# Patient Record
Sex: Male | Born: 1950
Health system: Southern US, Community
[De-identification: ages and names within clinical notes are randomized; demographics above are authoritative.]

## PROBLEM LIST (undated history)

## (undated) DIAGNOSIS — D649 Anemia, unspecified: Secondary | ICD-10-CM

## (undated) DIAGNOSIS — I10 Essential (primary) hypertension: Secondary | ICD-10-CM

---

## 2020-01-21 ENCOUNTER — Other Ambulatory Visit: Payer: Self-pay

## 2020-01-21 ENCOUNTER — Inpatient Hospital Stay (HOSPITAL_COMMUNITY)
Admission: EM | Admit: 2020-01-21 | Discharge: 2020-02-03 | DRG: 374 | Disposition: A | Discharge status: Home Health | Payer: Self-pay | Attending: Student | Admitting: Student

## 2020-01-21 ENCOUNTER — Encounter (HOSPITAL_COMMUNITY): Payer: Self-pay | Admitting: Emergency Medicine

## 2020-01-21 ENCOUNTER — Emergency Department (HOSPITAL_COMMUNITY): Payer: Self-pay

## 2020-01-21 DIAGNOSIS — R634 Abnormal weight loss: Secondary | ICD-10-CM | POA: Diagnosis present

## 2020-01-21 DIAGNOSIS — Z515 Encounter for palliative care: Secondary | ICD-10-CM

## 2020-01-21 DIAGNOSIS — R509 Fever, unspecified: Secondary | ICD-10-CM

## 2020-01-21 DIAGNOSIS — D630 Anemia in neoplastic disease: Secondary | ICD-10-CM | POA: Diagnosis present

## 2020-01-21 DIAGNOSIS — Z66 Do not resuscitate: Secondary | ICD-10-CM | POA: Diagnosis present

## 2020-01-21 DIAGNOSIS — Z20822 Contact with and (suspected) exposure to covid-19: Secondary | ICD-10-CM | POA: Diagnosis present

## 2020-01-21 DIAGNOSIS — E871 Hypo-osmolality and hyponatremia: Secondary | ICD-10-CM | POA: Diagnosis not present

## 2020-01-21 DIAGNOSIS — Z79899 Other long term (current) drug therapy: Secondary | ICD-10-CM

## 2020-01-21 DIAGNOSIS — E43 Unspecified severe protein-calorie malnutrition: Secondary | ICD-10-CM | POA: Diagnosis present

## 2020-01-21 DIAGNOSIS — K750 Abscess of liver: Secondary | ICD-10-CM | POA: Diagnosis present

## 2020-01-21 DIAGNOSIS — Z6820 Body mass index (BMI) 20.0-20.9, adult: Secondary | ICD-10-CM

## 2020-01-21 DIAGNOSIS — K2970 Gastritis, unspecified, without bleeding: Secondary | ICD-10-CM | POA: Diagnosis present

## 2020-01-21 DIAGNOSIS — R64 Cachexia: Secondary | ICD-10-CM | POA: Diagnosis present

## 2020-01-21 DIAGNOSIS — E872 Acidosis: Secondary | ICD-10-CM | POA: Diagnosis not present

## 2020-01-21 DIAGNOSIS — D649 Anemia, unspecified: Secondary | ICD-10-CM | POA: Diagnosis present

## 2020-01-21 DIAGNOSIS — C169 Malignant neoplasm of stomach, unspecified: Secondary | ICD-10-CM

## 2020-01-21 DIAGNOSIS — B954 Other streptococcus as the cause of diseases classified elsewhere: Secondary | ICD-10-CM | POA: Diagnosis present

## 2020-01-21 DIAGNOSIS — C787 Secondary malignant neoplasm of liver and intrahepatic bile duct: Secondary | ICD-10-CM | POA: Diagnosis present

## 2020-01-21 DIAGNOSIS — Z720 Tobacco use: Secondary | ICD-10-CM | POA: Diagnosis present

## 2020-01-21 DIAGNOSIS — Z87891 Personal history of nicotine dependence: Secondary | ICD-10-CM

## 2020-01-21 DIAGNOSIS — K3189 Other diseases of stomach and duodenum: Secondary | ICD-10-CM | POA: Insufficient documentation

## 2020-01-21 DIAGNOSIS — Z7189 Other specified counseling: Secondary | ICD-10-CM

## 2020-01-21 DIAGNOSIS — C49A2 Gastrointestinal stromal tumor of stomach: Principal | ICD-10-CM | POA: Diagnosis present

## 2020-01-21 DIAGNOSIS — B9681 Helicobacter pylori [H. pylori] as the cause of diseases classified elsewhere: Secondary | ICD-10-CM | POA: Diagnosis present

## 2020-01-21 DIAGNOSIS — D500 Iron deficiency anemia secondary to blood loss (chronic): Secondary | ICD-10-CM | POA: Diagnosis present

## 2020-01-21 DIAGNOSIS — K5900 Constipation, unspecified: Secondary | ICD-10-CM | POA: Diagnosis not present

## 2020-01-21 LAB — DIFFERENTIAL
Abs Immature Granulocytes: 0.06 10*3/uL (ref 0.00–0.07)
Basophils Absolute: 0 10*3/uL (ref 0.0–0.1)
Basophils Relative: 0 %
Eosinophils Absolute: 0 10*3/uL (ref 0.0–0.5)
Eosinophils Relative: 1 %
Immature Granulocytes: 1 %
Lymphocytes Relative: 14 %
Lymphs Abs: 1.1 10*3/uL (ref 0.7–4.0)
Monocytes Absolute: 1 10*3/uL (ref 0.1–1.0)
Monocytes Relative: 12 %
Neutro Abs: 6.2 10*3/uL (ref 1.7–7.7)
Neutrophils Relative %: 72 %

## 2020-01-21 LAB — HEPATIC FUNCTION PANEL
ALT: 31 U/L (ref 0–44)
AST: 55 U/L — ABNORMAL HIGH (ref 15–41)
Albumin: 1.5 g/dL — ABNORMAL LOW (ref 3.5–5.0)
Alkaline Phosphatase: 103 U/L (ref 38–126)
Bilirubin, Direct: <0.1 mg/dL (ref 0.0–0.2)
Total Bilirubin: 0.5 mg/dL (ref 0.3–1.2)
Total Protein: 6.3 g/dL — ABNORMAL LOW (ref 6.5–8.1)

## 2020-01-21 LAB — CBC
HCT: 21.2 % — ABNORMAL LOW (ref 39.0–52.0)
Hemoglobin: 6.2 g/dL — CL (ref 13.0–17.0)
MCH: 23 pg — ABNORMAL LOW (ref 26.0–34.0)
MCHC: 29.2 g/dL — ABNORMAL LOW (ref 30.0–36.0)
MCV: 78.8 fL — ABNORMAL LOW (ref 80.0–100.0)
Platelets: 420 10*3/uL — ABNORMAL HIGH (ref 150–400)
RBC: 2.69 MIL/uL — ABNORMAL LOW (ref 4.22–5.81)
RDW: 15.4 % (ref 11.5–15.5)
WBC: 8.3 10*3/uL (ref 4.0–10.5)
nRBC: 0 % (ref 0.0–0.2)

## 2020-01-21 LAB — BASIC METABOLIC PANEL
Anion gap: 9 (ref 5–15)
BUN: 18 mg/dL (ref 8–23)
CO2: 23 mmol/L (ref 22–32)
Calcium: 7.8 mg/dL — ABNORMAL LOW (ref 8.9–10.3)
Chloride: 101 mmol/L (ref 98–111)
Creatinine, Ser: 0.9 mg/dL (ref 0.61–1.24)
GFR calc Af Amer: >60 mL/min (ref >60)
GFR calc non Af Amer: >60 mL/min (ref >60)
Glucose, Bld: 129 mg/dL — ABNORMAL HIGH (ref 70–99)
Potassium: 4.2 mmol/L (ref 3.5–5.1)
Sodium: 133 mmol/L — ABNORMAL LOW (ref 135–145)

## 2020-01-21 LAB — LIPASE, BLOOD: Lipase: 19 U/L (ref 11–51)

## 2020-01-21 LAB — ABO/RH: ABO/RH(D): O POS

## 2020-01-21 LAB — PREPARE RBC (CROSSMATCH)

## 2020-01-21 MED ORDER — SODIUM CHLORIDE 0.9 % IV SOLN: INTRAVENOUS | Status: AC

## 2020-01-21 MED ORDER — ACETAMINOPHEN 325 MG PO TABS
650.0000 mg | ORAL_TABLET | Freq: Four times a day (QID) | ORAL | Status: DC | PRN
  Administered 2020-01-25 – 2020-01-27 (×4): 650 mg via ORAL
  Filled 2020-01-21 (×4): qty 2

## 2020-01-21 MED ORDER — SODIUM CHLORIDE 0.9% IV SOLUTION: Freq: Once | INTRAVENOUS | Status: DC

## 2020-01-21 MED ORDER — PANTOPRAZOLE SODIUM 40 MG IV SOLR
40.0000 mg | Freq: Two times a day (BID) | INTRAVENOUS | Status: DC
  Administered 2020-01-22 – 2020-01-25 (×9): 40 mg via INTRAVENOUS
  Filled 2020-01-21 (×8): qty 40

## 2020-01-21 MED ORDER — ACETAMINOPHEN 650 MG RE SUPP: 650.0000 mg | Freq: Four times a day (QID) | RECTAL | Status: DC | PRN

## 2020-01-21 MED ORDER — IOHEXOL 300 MG/ML  SOLN
100.0000 mL | Freq: Once | INTRAMUSCULAR | Status: AC | PRN
  Administered 2020-01-21: 100 mL via INTRAVENOUS

## 2020-01-21 NOTE — ED Triage Notes (Signed)
Using translator, pt states he needs someone to check on his belly because he cannot eat. Denies abd pain. Reports loss of appetite. Denies any medical hx

## 2020-01-21 NOTE — ED Provider Notes (Signed)
[facility] EMERGENCY DEPARTMENT Provider Note   CSN: [id number] Arrival date & time: 01/21/20  1819     History Chief Complaint  Patient presents with  . Anorexia    Kevin [name] is a 69 y.o. male.  Patient is a 69 year old male with no significant past medical history presenting today with multiple complaints.  Patient states since November he has had a problem with his stomach to where he has had difficulty eating.  He states every time he tries to eat he loses his appetite and can eat very little.  He has had no nausea or vomiting and denies any change in his bowel movements.  However he is lost significant weight because he has had no appetite to eat.  He is also noticed more recently when he is at work he gets weak with even minimal activity and sometimes will feel short of breath.  He occasionally have abdominal pain and fullness but no localized severe pain.  He denies any chest pain.  He has not sought evaluation for this until today.  He has not had cough or fever.  The history is provided by the patient. The history is limited by a language barrier. A language interpreter was used.       History reviewed. No pertinent past medical history.  There are no problems to display for this patient.   History reviewed. No pertinent surgical history.     No family history on file.  Social History   Tobacco Use  . Smoking status: Not on file  Substance Use Topics  . Alcohol use: Not on file  . Drug use: Not on file    Home Medications Prior to Admission medications   Not on File    Allergies    Patient has no allergy information on record.  Review of Systems   Review of Systems  All other systems reviewed and are negative.   Physical Exam Updated Vital Signs BP 136/68   Pulse 100   Temp 100.2 F (37.9 C) (Oral)   Resp 16   SpO2 100%   Physical Exam Vitals and nursing note reviewed.  Constitutional:      General: He is not in acute  distress.    Appearance: He is well-developed and underweight.  HENT:     Head: Normocephalic and atraumatic.     Mouth/Throat:     Mouth: Mucous membranes are moist.  Eyes:     Pupils: Pupils are equal, round, and reactive to light.     Comments: Pale conjunctive a  Cardiovascular:     Rate and Rhythm: Regular rhythm. Tachycardia present.     Heart sounds: No murmur.  Pulmonary:     Effort: Pulmonary effort is normal. No respiratory distress.     Breath sounds: Normal breath sounds. No wheezing or rales.  Abdominal:     General: There is no distension.     Palpations: Abdomen is soft. There is splenomegaly.     Tenderness: There is no abdominal tenderness. There is no guarding or rebound.  Musculoskeletal:        General: No tenderness. Normal range of motion.     Cervical back: Normal range of motion and neck supple.  Skin:    General: Skin is warm and dry.     Coloration: Skin is pale.     Findings: No erythema or rash.  Neurological:     General: No focal deficit present.     Mental Status: He  is alert and oriented to person, place, and time. Mental status is at baseline.  Psychiatric:        Mood and Affect: Mood normal.        Behavior: Behavior normal.        Thought Content: Thought content normal.     ED Results / Procedures / Treatments   Labs (all labs ordered are listed, but only abnormal results are displayed) Labs Reviewed  CBC - Abnormal; Notable for the following components:      Result Value   RBC 2.69 (*)    Hemoglobin 6.2 (*)    HCT 21.2 (*)    MCV 78.8 (*)    MCH 23.0 (*)    MCHC 29.2 (*)    Platelets 420 (*)    All other components within normal limits  BASIC METABOLIC PANEL - Abnormal; Notable for the following components:   Sodium 133 (*)    Glucose, Bld 129 (*)    Calcium 7.8 (*)    All other components within normal limits  HEPATIC FUNCTION PANEL - Abnormal; Notable for the following components:   Total Protein 6.3 (*)    Albumin 1.5  (*)    AST 55 (*)    All other components within normal limits  LIPASE, BLOOD  DIFFERENTIAL  TYPE AND SCREEN  ABO/RH  PREPARE RBC (CROSSMATCH)    EKG None  Radiology CT ABDOMEN PELVIS W CONTRAST  Result Date: 01/21/2020 CLINICAL DATA:  Loss of appetite EXAM: CT ABDOMEN AND PELVIS WITH CONTRAST TECHNIQUE: Multidetector CT imaging of the abdomen and pelvis was performed using the standard protocol following bolus administration of intravenous contrast. CONTRAST:  118mL OMNIPAQUE IOHEXOL 300 MG/ML  SOLN COMPARISON:  None. FINDINGS: Lower chest: No acute pleural or parenchymal lung disease. Hepatobiliary: Hypodense mass right lobe liver measures up to 5.2 x 6.9 cm coarsened for metastatic disease. Gallbladder is unremarkable. Pancreas: Unremarkable. No pancreatic ductal dilatation or surrounding inflammatory changes. Spleen: Normal in size without focal abnormality. Adrenals/Urinary Tract: Left renal cyst. Areas of right renal cortical scarring are noted. No obstructive uropathy. The adrenals are normal. Bladder is unremarkable. Stomach/Bowel: There is a large necrotic mass centered in the gastric body and antrum, measuring 16.5 by 10 cm in transverse dimension, and extending approximately 17.3 cm in craniocaudal length. Findings are consistent with gastric adenocarcinoma. This mass results and mass effect upon the upper abdominal viscera but I do not see any direct invasion. No bowel obstruction.  Normal appendix. Vascular/Lymphatic: Vascular structures are patent. No critical stenosis. I do not see any discrete adenopathy within the abdomen or pelvis. Reproductive: The prostate is enlarged measuring 5.3 x 5.3 cm in transverse dimension and extending approximately 6.8 cm in craniocaudal length. There is mass effect upon the posterior wall the bladder. Other: No free fluid.  No free intraperitoneal gas. Musculoskeletal: There are no acute or destructive bony lesions. Reconstructed images demonstrate no  additional findings. IMPRESSION: 1. Large necrotic mass centered in the posterior wall of the stomach, consistent with gastric adenocarcinoma. Endoscopy is recommended for further evaluation. 2. Irregular hypodense mass right lobe liver, worrisome for metastatic disease. 3. Enlarged prostate. Electronically Signed   By: Randa Ngo M.D.   On: 01/21/2020 21:56    Procedures Procedures (including critical care time)  Medications Ordered in ED Medications - No data to display  ED Course  I have reviewed the triage vital signs and the nursing notes.  Pertinent labs & imaging results that were available during my  care of the patient were reviewed by me and considered in my medical decision making (see chart for details).    MDM Rules/Calculators/A&P                      Patient presenting today with persistent anorexia since November with hunger but then unable to eat much.  He is also started having weakness with activity and work.  Patient is pale on exam and has splenomegaly.  He has no significant abdominal tenderness on palpation.  He does not use drugs or alcohol and takes no medications regularly.  Today CBC is consistent with an anemia with a hemoglobin of 6.2 which most likely explains the patient's weakness.  BMP normal creatinine but decreased calcium of 7.8.  Differential, liver function tests and type and screen pending.  CT also pending for further evaluation.  Concern for cancer as the cause of his symptoms.    10:16 PM Patient's CT is consistent with a large necrotic mass centered in the posterior wall of the stomach consistent with gastric adenocarcinoma with a hypodense lesion in the liver concerning for metastases.  Given patient's anemia he will be transfused 2 units of blood.  Will admit for further care and evaluation as patient does not have a primary physician.  CRITICAL CARE Performed by: Zaki Gertsch Total critical care time: 30 minutes Critical care time was  exclusive of separately billable procedures and treating other patients. Critical care was necessary to treat or prevent imminent or life-threatening deterioration. Critical care was time spent personally by me on the following activities: development of treatment plan with patient and/or surrogate as well as nursing, discussions with consultants, evaluation of patient's response to treatment, examination of patient, obtaining history from patient or surrogate, ordering and performing treatments and interventions, ordering and review of laboratory studies, ordering and review of radiographic studies, pulse oximetry and re-evaluation of patient's condition.  Final Clinical Impression(s) / ED Diagnoses Final diagnoses:  Anemia, unspecified type  Gastric adenocarcinoma [facility])    Rx / DC Orders ED Discharge Orders    None       Blanchie Dessert, MD 01/21/20 2218

## 2020-01-21 NOTE — H&P (Signed)
TRH H&P    Patient Demographics:    Kevin [name], is a 69 y.o. male  MRN: [id number]  DOB - [birth year]  Admit Date - 01/21/2020  Referring MD/NP/PA:  Blanchie Dessert  Outpatient Primary MD for the patient is Patient, No Pcp Per  Patient coming from:  home  Chief complaint- difficulty eating. weight loss   HPI:    Kevin [name]  is a 69 y.o. male, smoker presents with difficulty eating and requested someone to check on his belly.  He has had loss of appetite and about 65lbs of weight loss.  Pt notes stool is black .  Pt denies fever, chills, cough, cp, palp, sob, n/v, abd pain, diarrhea, brbpr.   In ED,  T 100.2, P 100, R 16, Bp 136/68  Pox 100% on RA  CT abd/ pelvis IMPRESSION: 1. Large necrotic mass centered in the posterior wall of the stomach, consistent with gastric adenocarcinoma. Endoscopy is recommended for further evaluation. 2. Irregular hypodense mass right lobe liver, worrisome for metastatic disease.   Wbc 8.3, Hgb 6.2, Plt 420 Na 133, K 4.2, Bun 18, Creatinine 0.90 Ast 55, Alt 31, Alk phos 103, T. Bili 0.5 Alb 1.5  Type and screen covid-19 pending  Pt will be admitted for anemia likely related to gastric mass w possible metastatic disease to liver    Review of systems:    In addition to the HPI above,  No Fever-chills, No Headache, No changes with Vision or hearing, No problems swallowing food or Liquids, No Chest pain, Cough or Shortness of Breath, No Abdominal pain, No Nausea or Vomiting,   No Blood in urine No dysuria, No new skin rashes or bruises, No new joints pains-aches,  No new weakness, tingling, numbness in any extremity, No recent weight gain or loss, No polyuria, polydypsia or polyphagia, No significant Mental Stressors.  All other systems reviewed and are negative.    Past History of the following :    History reviewed. No pertinent past medical  history.   None per patient History reviewed. No pertinent surgical history. None per patient   Social History:      Social History   Tobacco Use  . Smoking status: Current Every Day Smoker    Types: Cigarettes  . Smokeless tobacco: Never Used  Substance Use Topics  . Alcohol use: Not Currently       Family History :     Family History  Family history unknown: Yes   Pt doesn't recall any family hx of gastric cancer   Home Medications:   Prior to Admission medications   Not on File     Allergies:    Not on File   Physical Exam:   Vitals  Blood pressure 120/64, pulse 85, temperature 100.2 F (37.9 C), temperature source Oral, resp. rate (!) 31, SpO2 96 %.  1.  General:  axox3  2. Psychiatric: euthymic  3. Neurologic: nonfocal  4. HEENMT:  Anicteric, pale conjuctiva Neck: no jvd  5. Respiratory : CTAB  6. Cardiovascular : rrr  s1, s2, no m/g/r  7. Gastrointestinal:  Abd: soft, nt, nd, +bs,  There is a mass on the left upper abdomen, palpable.   8. Skin:  Ext: no c/c/e, no rash  9.Musculoskeletal:  Good ROM     Data Review:    CBC Recent Labs  Lab 01/21/20 1839  WBC 8.3  HGB 6.2*  HCT 21.2*  PLT 420*  MCV 78.8*  MCH 23.0*  MCHC 29.2*  RDW 15.4  LYMPHSABS 1.1  MONOABS 1.0  EOSABS 0.0  BASOSABS 0.0   ------------------------------------------------------------------------------------------------------------------  Results for orders placed or performed during the hospital encounter of 01/21/20 (from the past 48 hour(s))  CBC     Status: Abnormal   Collection Time: 01/21/20  6:39 PM  Result Value Ref Range   WBC 8.3 4.0 - 10.5 K/uL   RBC 2.69 (L) 4.22 - 5.81 MIL/uL   Hemoglobin 6.2 (LL) 13.0 - 17.0 g/dL    Comment: This critical result has verified and been called to T SHROPSHIRE RN by Remus Loffler on 02 12 2021 at 1901, and has been read back.  REPEATED TO VERIFY CORRECTED ON 02/12 AT 1951: PREVIOUSLY REPORTED AS 6.2  This critical result has verified and been called to T SHROPSHIRE RN by Remus Loffler on 02 12 2021 at 1901, and has been read back.     HCT 21.2 (L) 39.0 - 52.0 %   MCV 78.8 (L) 80.0 - 100.0 fL   MCH 23.0 (L) 26.0 - 34.0 pg   MCHC 29.2 (L) 30.0 - 36.0 g/dL   RDW 15.4 11.5 - 15.5 %   Platelets 420 (H) 150 - 400 K/uL   nRBC 0.0 0.0 - 0.2 %    Comment: Performed at [facility] [address] [place], [place] [address]  Basic metabolic panel     Status: Abnormal   Collection Time: 01/21/20  6:39 PM  Result Value Ref Range   Sodium 133 (L) 135 - 145 mmol/L   Potassium 4.2 3.5 - 5.1 mmol/L   Chloride 101 98 - 111 mmol/L   CO2 23 22 - 32 mmol/L   Glucose, Bld 129 (H) 70 - 99 mg/dL   BUN 18 8 - 23 mg/dL   Creatinine, Ser 0.90 0.61 - 1.24 mg/dL   Calcium 7.8 (L) 8.9 - 10.3 mg/dL   GFR calc non Af Amer >60 >60 mL/min   GFR calc Af Amer >60 >60 mL/min   Anion gap 9 5 - 15    Comment: Performed at [facility] [address] [place], [place] [address]  Lipase, blood     Status: None   Collection Time: 01/21/20  6:39 PM  Result Value Ref Range   Lipase 19 11 - 51 U/L    Comment: Performed at [facility] [address], [place], [place] [address]  Hepatic function panel     Status: Abnormal   Collection Time: 01/21/20  6:39 PM  Result Value Ref Range   Total Protein 6.3 (L) 6.5 - 8.1 g/dL   Albumin 1.5 (L) 3.5 - 5.0 g/dL   AST 55 (H) 15 - 41 U/L   ALT 31 0 - 44 U/L   Alkaline Phosphatase 103 38 - 126 U/L   Total Bilirubin 0.5 0.3 - 1.2 mg/dL   Bilirubin, Direct <0.1 0.0 - 0.2 mg/dL   Indirect Bilirubin NOT CALCULATED 0.3 - 0.9 mg/dL    Comment: Performed at [facility]  [address], [place], [place] [address]  Differential     Status: None   Collection Time: 01/21/20  6:39 PM  Result Value Ref Range   Neutrophils Relative % 72 %   Neutro Abs 6.2 1.7 - 7.7 K/uL   Lymphocytes Relative 14 %   Lymphs Abs 1.1 0.7 - 4.0 K/uL   Monocytes Relative 12  %   Monocytes Absolute 1.0 0.1 - 1.0 K/uL   Eosinophils Relative 1 %   Eosinophils Absolute 0.0 0.0 - 0.5 K/uL   Basophils Relative 0 %   Basophils Absolute 0.0 0.0 - 0.1 K/uL   Immature Granulocytes 1 %   Abs Immature Granulocytes 0.06 0.00 - 0.07 K/uL    Comment: Performed at [facility] [address] [place], [place] [address]  Type and screen     Status: None (Preliminary result)   Collection Time: 01/21/20  8:40 PM  Result Value Ref Range   ABO/RH(D) O POS    Antibody Screen NEG    Sample Expiration      01/24/2020,2359 Performed at [facility], [place] [address] [place], [place] [address]    Unit Number [id number]    Blood Component Type RED CELLS,LR    Unit division 00    Status of Unit ALLOCATED    Transfusion Status OK TO TRANSFUSE    Crossmatch Result Compatible    Unit Number [id number]    Blood Component Type RED CELLS,LR    Unit division 00    Status of Unit ALLOCATED    Transfusion Status OK TO TRANSFUSE    Crossmatch Result Compatible   ABO/Rh     Status: None   Collection Time: 01/21/20  8:40 PM  Result Value Ref Range   ABO/RH(D)      O POS Performed at [facility] [address], [place], [place] [address]   Prepare RBC     Status: None   Collection Time: 01/21/20 10:12 PM  Result Value Ref Range   Order Confirmation      ORDER PROCESSED BY BLOOD BANK Performed at [facility], [place] [address] [place],  [address]     Chemistries  Recent Labs  Lab 01/21/20 1839  NA 133*  K 4.2  CL 101  CO2 23  GLUCOSE 129*  BUN 18  CREATININE 0.90  CALCIUM 7.8*  AST 55*  ALT 31  ALKPHOS 103  BILITOT 0.5   ------------------------------------------------------------------------------------------------------------------  ------------------------------------------------------------------------------------------------------------------ GFR: CrCl cannot be calculated (Unknown ideal weight.). Liver Function Tests:  Recent Labs  Lab 01/21/20 1839  AST 55*  ALT 31  ALKPHOS 103  BILITOT 0.5  PROT 6.3*  ALBUMIN 1.5*   Recent Labs  Lab 01/21/20 1839  LIPASE 19   No results for input(s): AMMONIA in the last 168 hours. Coagulation Profile: No results for input(s): INR, PROTIME in the last 168 hours. Cardiac Enzymes: No results for input(s): CKTOTAL, CKMB, CKMBINDEX, TROPONINI in the last 168 hours. BNP (last 3 results) No results for input(s): PROBNP in the last 8760 hours. HbA1C: No results for input(s): HGBA1C in the last 72 hours. CBG: No results for input(s): GLUCAP in the last 168 hours. Lipid Profile: No results for input(s): CHOL, HDL, LDLCALC, TRIG, CHOLHDL, LDLDIRECT in the last 72 hours. Thyroid Function Tests: No results for input(s): TSH, T4TOTAL, FREET4, T3FREE, THYROIDAB in the last 72 hours. Anemia Panel: No results for input(s): VITAMINB12, FOLATE, FERRITIN, TIBC, IRON, RETICCTPCT in the last 72 hours.  ---------------------------------------------------------------------------------------------------------------  Urine analysis: No results found for: COLORURINE, APPEARANCEUR, LABSPEC, PHURINE, GLUCOSEU, HGBUR, BILIRUBINUR, KETONESUR, PROTEINUR, UROBILINOGEN, NITRITE, LEUKOCYTESUR    Imaging Results:    CT ABDOMEN PELVIS W CONTRAST  Result Date: 01/21/2020 CLINICAL DATA:  Loss of appetite EXAM: CT ABDOMEN AND PELVIS WITH CONTRAST TECHNIQUE: Multidetector CT imaging of the abdomen and pelvis was performed using the standard protocol following bolus administration of intravenous contrast. CONTRAST:  18m OMNIPAQUE IOHEXOL 300 MG/ML  SOLN COMPARISON:  None. FINDINGS: Lower chest: No acute pleural or parenchymal lung disease. Hepatobiliary: Hypodense mass right lobe liver measures up to 5.2 x 6.9 cm coarsened for metastatic disease. Gallbladder is unremarkable. Pancreas: Unremarkable. No pancreatic ductal dilatation or surrounding inflammatory changes. Spleen: Normal in size  without focal abnormality. Adrenals/Urinary Tract: Left renal cyst. Areas of right renal cortical scarring are noted. No obstructive uropathy. The adrenals are normal. Bladder is unremarkable. Stomach/Bowel: There is a large necrotic mass centered in the gastric body and antrum, measuring 16.5 by 10 cm in transverse dimension, and extending approximately 17.3 cm in craniocaudal length. Findings are consistent with gastric adenocarcinoma. This mass results and mass effect upon the upper abdominal viscera but I do not see any direct invasion. No bowel obstruction.  Normal appendix. Vascular/Lymphatic: Vascular structures are patent. No critical stenosis. I do not see any discrete adenopathy within the abdomen or pelvis. Reproductive: The prostate is enlarged measuring 5.3 x 5.3 cm in transverse dimension and extending approximately 6.8 cm in craniocaudal length. There is mass effect upon the posterior wall the bladder. Other: No free fluid.  No free intraperitoneal gas. Musculoskeletal: There are no acute or destructive bony lesions. Reconstructed images demonstrate no additional findings. IMPRESSION: 1. Large necrotic mass centered in the posterior wall of the stomach, consistent with gastric adenocarcinoma. Endoscopy is recommended for further evaluation. 2. Irregular hypodense mass right lobe liver, worrisome for metastatic disease. 3. Enlarged prostate. Electronically Signed   By: MRanda NgoM.D.   On: 01/21/2020 21:56       Assessment & Plan:    Principal Problem:   Anemia Active Problems:   Mass of stomach  Anemia protonix 489miv bid Type and screen Transfuse 2 units prbc as ordered by ED, appreciate input Check cbc in am  Gastric Mass NPO Please consult GI for evaluation in am Please consult oncology in am  Abnormal liver function Likely secondary to metastatic disease Check cmp in am  Tobacco use Nicotine patch prn   Fever, low grade Might be from malignancy Check urinalysis  Check CXR  DVT Prophylaxis-   SCDs   AM Labs Ordered, also please review Full Orders  Family Communication: Admission, patients condition and plan of care including tests being ordered have been discussed with the patient  who indicate understanding and agree with the plan and Code Status.  Code Status:  FULL CODE per patient, I attempted to call son (JMolli Barrows w the number that the patient gave me but I got no response.   Admission status: Observation: Based on patients clinical presentation and evaluation of above clinical data, I have made determination that patient meets observation criteria at this time.   Time spent in minutes :   55 minutes    JaJani Gravel.D on 01/21/2020 at 11:23 PM

## 2020-01-22 ENCOUNTER — Observation Stay (HOSPITAL_COMMUNITY): Payer: Self-pay

## 2020-01-22 DIAGNOSIS — C169 Malignant neoplasm of stomach, unspecified: Secondary | ICD-10-CM

## 2020-01-22 DIAGNOSIS — K3189 Other diseases of stomach and duodenum: Secondary | ICD-10-CM | POA: Diagnosis present

## 2020-01-22 LAB — RESPIRATORY PANEL BY RT PCR (FLU A&B, COVID)
Influenza A by PCR: NEGATIVE
Influenza B by PCR: NEGATIVE
SARS Coronavirus 2 by RT PCR: NEGATIVE

## 2020-01-22 LAB — COMPREHENSIVE METABOLIC PANEL
ALT: 29 U/L (ref 0–44)
AST: 52 U/L — ABNORMAL HIGH (ref 15–41)
Albumin: 1.4 g/dL — ABNORMAL LOW (ref 3.5–5.0)
Alkaline Phosphatase: 88 U/L (ref 38–126)
Anion gap: 11 (ref 5–15)
BUN: 13 mg/dL (ref 8–23)
CO2: 20 mmol/L — ABNORMAL LOW (ref 22–32)
Calcium: 7.4 mg/dL — ABNORMAL LOW (ref 8.9–10.3)
Chloride: 103 mmol/L (ref 98–111)
Creatinine, Ser: 0.74 mg/dL (ref 0.61–1.24)
GFR calc Af Amer: >60 mL/min (ref >60)
GFR calc non Af Amer: >60 mL/min (ref >60)
Glucose, Bld: 97 mg/dL (ref 70–99)
Potassium: 3.8 mmol/L (ref 3.5–5.1)
Sodium: 134 mmol/L — ABNORMAL LOW (ref 135–145)
Total Bilirubin: 1.4 mg/dL — ABNORMAL HIGH (ref 0.3–1.2)
Total Protein: 5.6 g/dL — ABNORMAL LOW (ref 6.5–8.1)

## 2020-01-22 LAB — URINALYSIS, ROUTINE W REFLEX MICROSCOPIC
Bilirubin Urine: NEGATIVE
Glucose, UA: NEGATIVE mg/dL
Hgb urine dipstick: NEGATIVE
Ketones, ur: NEGATIVE mg/dL
Leukocytes,Ua: NEGATIVE
Nitrite: NEGATIVE
Protein, ur: NEGATIVE mg/dL
Specific Gravity, Urine: 1.04 — ABNORMAL HIGH (ref 1.005–1.030)
pH: 5 (ref 5.0–8.0)

## 2020-01-22 LAB — MAGNESIUM: Magnesium: 2.2 mg/dL (ref 1.7–2.4)

## 2020-01-22 LAB — CBC
HCT: 27 % — ABNORMAL LOW (ref 39.0–52.0)
Hemoglobin: 8.6 g/dL — ABNORMAL LOW (ref 13.0–17.0)
MCH: 25.5 pg — ABNORMAL LOW (ref 26.0–34.0)
MCHC: 31.9 g/dL (ref 30.0–36.0)
MCV: 80.1 fL (ref 80.0–100.0)
Platelets: 369 10*3/uL (ref 150–400)
RBC: 3.37 MIL/uL — ABNORMAL LOW (ref 4.22–5.81)
RDW: 16.2 % — ABNORMAL HIGH (ref 11.5–15.5)
WBC: 8.5 10*3/uL (ref 4.0–10.5)
nRBC: 0 % (ref 0.0–0.2)

## 2020-01-22 MED ORDER — ONDANSETRON HCL 4 MG/2ML IJ SOLN
4.0000 mg | Freq: Four times a day (QID) | INTRAMUSCULAR | Status: DC | PRN
  Administered 2020-01-28 – 2020-01-30 (×2): 4 mg via INTRAVENOUS
  Filled 2020-01-22 (×3): qty 2

## 2020-01-22 MED ORDER — INFLUENZA VAC A&B SA ADJ QUAD 0.5 ML IM PRSY
0.5000 mL | PREFILLED_SYRINGE | INTRAMUSCULAR | Status: DC
  Filled 2020-01-22: qty 0.5

## 2020-01-22 MED ORDER — ADULT MULTIVITAMIN W/MINERALS CH
1.0000 | ORAL_TABLET | Freq: Every day | ORAL | Status: DC
  Administered 2020-01-22 – 2020-02-03 (×13): 1 via ORAL
  Filled 2020-01-22 (×13): qty 1

## 2020-01-22 MED ORDER — ENSURE ENLIVE PO LIQD
237.0000 mL | Freq: Three times a day (TID) | ORAL | Status: DC
  Administered 2020-01-23 – 2020-01-30 (×11): 237 mL via ORAL

## 2020-01-22 MED ORDER — PNEUMOCOCCAL VAC POLYVALENT 25 MCG/0.5ML IJ INJ: 0.5000 mL | INJECTION | INTRAMUSCULAR | Status: DC

## 2020-01-22 MED ORDER — NICOTINE 14 MG/24HR TD PT24
14.0000 mg | MEDICATED_PATCH | Freq: Every day | TRANSDERMAL | Status: DC
  Administered 2020-01-28 – 2020-02-03 (×5): 14 mg via TRANSDERMAL
  Filled 2020-01-22 (×12): qty 1

## 2020-01-22 NOTE — Progress Notes (Signed)
With use of spanish translation ipad patients current diet explained. Patient given a Spanish menu and items allowed under a full liquid diet pointed out. Patient verbalizes he is eager to eat. A tray was called and confirmed for the patient. A fresh water cup was also provided.

## 2020-01-22 NOTE — Plan of Care (Signed)
Pt is alert and oriented X4. Has difficulty with communication but interpreter was used for the assessment. Pt denies any pain or discomfort at this moment. Pt has been oriented to the room and the use of call bell. Call bell within reach, skin dry and warm. No respiratory distress is noted at the moment. Will continue to monitor the patient closely.

## 2020-01-22 NOTE — Progress Notes (Signed)
Progress Note    Kevin [name]  [id number] DOB: 11-06-1951  DOA: 01/21/2020 PCP: Patient, No Pcp Per    Brief Narrative:    Medical records reviewed and are as summarized below:  Kevin [name] is an 68 y.o. male smoker presents with difficulty eating and requested someone to check on his belly.  He has had loss of appetite and about 65lbs of weight loss.  Pt notes stool is black .  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 Penelope Coop of [facility] -Will need eventual oncology consultation  Abnormal liver function Likely secondary to metastatic disease C and  Tobacco use Nicotine patch  Fever, low grade Might be from malignancy UA negative X-ray with possible infection versus atelectasis, add incentive spirometer for now and we will consider adding antibiotics pending further fever   Family Communication/Anticipated D/C date and plan/Code Status   DVT prophylaxis: SCDs due to bleeding Code Status: Full Code.  Family Communication: Spoke with patient via interpreter Ana Disposition Plan: Patient needs further work-up in the hospital including EGD with biopsy   Medical Consultants:    GI   Subjective:   Upset that he has not been able to eat and does not understand why people are coming in and out of his room   Objective:    Vitals:   01/22/20 0418 01/22/20 0422 01/22/20 0748 01/22/20 0814  BP: (!) 108/58 104/65 117/68 119/66  Pulse: 72 75 84 80  Resp: 18 18 16 20   Temp: 98.7 F (37.1 C) 98.7 F (37.1 C) (!) 100.5 F (38.1 C) 97.9 F (36.6 C)  TempSrc: Oral Oral Oral Oral  SpO2: 100% 100% 97% 97%  Weight:      Height:        Intake/Output Summary (Last 24 hours) at 01/22/2020 1108 Last data  filed at 01/22/2020 0740 Gross per 24 hour  Intake 635 ml  Output --  Net 635 ml   Filed Weights   01/22/20 0207  Weight: 57.7 kg    Exam: In bed irritable Regular rate and rhythm Positive bowel sounds soft minimally tender in the epigastric area Alert and oriented but frustrated  Data Reviewed:   I have personally reviewed following labs and imaging studies:  Labs: Labs show the following:   Basic Metabolic Panel: Recent Labs  Lab 01/21/20 1839 01/22/20 1019  NA 133*  --   K 4.2  --   CL 101  --   CO2 23  --   GLUCOSE 129*  --   BUN 18  --   CREATININE 0.90  --   CALCIUM 7.8*  --   MG  --  2.2   GFR Estimated Creatinine Clearance: 65 mL/min (by C-G formula based on SCr of 0.9 mg/dL). Liver Function Tests: Recent Labs  Lab 01/21/20 1839  AST 55*  ALT 31  ALKPHOS 103  BILITOT 0.5  PROT 6.3*  ALBUMIN 1.5*   Recent Labs  Lab 01/21/20 1839  LIPASE 19   No results for input(s): AMMONIA in the last 168 hours. Coagulation profile No results for input(s): INR, PROTIME in the last 168 hours.  CBC: Recent Labs  Lab 01/21/20 1839 01/22/20 1019  WBC 8.3 8.5  NEUTROABS 6.2  --  HGB 6.2* 8.6*  HCT 21.2* 27.0*  MCV 78.8* 80.1  PLT 420* 369   Cardiac Enzymes: No results for input(s): CKTOTAL, CKMB, CKMBINDEX, TROPONINI in the last 168 hours. BNP (last 3 results) No results for input(s): PROBNP in the last 8760 hours. CBG: No results for input(s): GLUCAP in the last 168 hours. D-Dimer: No results for input(s): DDIMER in the last 72 hours. Hgb A1c: No results for input(s): HGBA1C in the last 72 hours. Lipid Profile: No results for input(s): CHOL, HDL, LDLCALC, TRIG, CHOLHDL, LDLDIRECT in the last 72 hours. Thyroid function studies: No results for input(s): TSH, T4TOTAL, T3FREE, THYROIDAB in the last 72 hours.  Invalid input(s): FREET3 Anemia work up: No results for input(s): VITAMINB12, FOLATE, FERRITIN, TIBC, IRON, RETICCTPCT in the last 72  hours. Sepsis Labs: Recent Labs  Lab 01/21/20 1839 01/22/20 1019  WBC 8.3 8.5    Microbiology Recent Results (from the past 240 hour(s))  Respiratory Panel by RT PCR (Flu A&B, Covid) - Nasopharyngeal Swab     Status: None   Collection Time: 01/21/20 11:20 PM   Specimen: Nasopharyngeal Swab  Result Value Ref Range Status   SARS Coronavirus 2 by RT PCR NEGATIVE NEGATIVE Final    Comment: (NOTE) SARS-CoV-2 target nucleic acids are NOT DETECTED. The SARS-CoV-2 RNA is generally detectable in upper respiratoy specimens during the acute phase of infection. The lowest concentration of SARS-CoV-2 viral copies this assay can detect is 131 copies/mL. A negative result does not preclude SARS-Cov-2 infection and should not be used as the sole basis for treatment or other patient management decisions. A negative result may occur with  improper specimen collection/handling, submission of specimen other than nasopharyngeal swab, presence of viral mutation(s) within the areas targeted by this assay, and inadequate number of viral copies (<131 copies/mL). A negative result must be combined with clinical observations, patient history, and epidemiological information. The expected result is Negative. Fact Sheet for Patients:  PinkCheek.be Fact Sheet for Healthcare Providers:  GravelBags.it This test is not yet ap proved or cleared by the [place] FDA and  has been authorized for detection and/or diagnosis of SARS-CoV-2 by FDA under an Emergency Use Authorization (EUA). This EUA will remain  in effect (meaning this test can be used) for the duration of the COVID-19 declaration under Section 564(b)(1) of the Act, 21 U.S.C. section 360bbb-3(b)(1), unless the authorization is terminated or revoked sooner.    Influenza A by PCR NEGATIVE NEGATIVE Final   Influenza B by PCR NEGATIVE NEGATIVE Final    Comment: (NOTE) The Xpert Xpress  SARS-CoV-2/FLU/RSV assay is intended as an aid in  the diagnosis of influenza from Nasopharyngeal swab specimens and  should not be used as a sole basis for treatment. Nasal washings and  aspirates are unacceptable for Xpert Xpress SARS-CoV-2/FLU/RSV  testing. Fact Sheet for Patients: PinkCheek.be Fact Sheet for Healthcare Providers: GravelBags.it This test is not yet approved or cleared by the [place] FDA and  has been authorized for detection and/or diagnosis of SARS-CoV-2 by  FDA under an Emergency Use Authorization (EUA). This EUA will remain  in effect (meaning this test can be used) for the duration of the  Covid-19 declaration under Section 564(b)(1) of the Act, 21  U.S.C. section 360bbb-3(b)(1), unless the authorization is  terminated or revoked. Performed at [facility], [place] [address] [place], [place] [address]     Procedures and diagnostic studies:  CT ABDOMEN PELVIS W CONTRAST  Result Date: 01/21/2020 CLINICAL DATA:  Loss of appetite EXAM: CT ABDOMEN AND PELVIS WITH CONTRAST TECHNIQUE: Multidetector CT imaging of the abdomen and pelvis was performed using the standard protocol following bolus administration of intravenous contrast. CONTRAST:  129mL OMNIPAQUE IOHEXOL 300 MG/ML  SOLN COMPARISON:  None. FINDINGS: Lower chest: No acute pleural or parenchymal lung disease. Hepatobiliary: Hypodense mass right lobe liver measures up to 5.2 x 6.9 cm coarsened for metastatic disease. Gallbladder is unremarkable. Pancreas: Unremarkable. No pancreatic ductal dilatation or surrounding inflammatory changes. Spleen: Normal in size without focal abnormality. Adrenals/Urinary Tract: Left renal cyst. Areas of right renal cortical scarring are noted. No obstructive uropathy. The adrenals are normal. Bladder is unremarkable. Stomach/Bowel: There is a large necrotic mass centered in the gastric body and antrum, measuring 16.5 by  10 cm in transverse dimension, and extending approximately 17.3 cm in craniocaudal length. Findings are consistent with gastric adenocarcinoma. This mass results and mass effect upon the upper abdominal viscera but I do not see any direct invasion. No bowel obstruction.  Normal appendix. Vascular/Lymphatic: Vascular structures are patent. No critical stenosis. I do not see any discrete adenopathy within the abdomen or pelvis. Reproductive: The prostate is enlarged measuring 5.3 x 5.3 cm in transverse dimension and extending approximately 6.8 cm in craniocaudal length. There is mass effect upon the posterior wall the bladder. Other: No free fluid.  No free intraperitoneal gas. Musculoskeletal: There are no acute or destructive bony lesions. Reconstructed images demonstrate no additional findings. IMPRESSION: 1. Large necrotic mass centered in the posterior wall of the stomach, consistent with gastric adenocarcinoma. Endoscopy is recommended for further evaluation. 2. Irregular hypodense mass right lobe liver, worrisome for metastatic disease. 3. Enlarged prostate. Electronically Signed   By: Randa Ngo M.D.   On: 01/21/2020 21:56   DG CHEST PORT 1 VIEW  Result Date: 01/22/2020 CLINICAL DATA:  Unable to eat, loss of appetite, no abdominal pain EXAM: PORTABLE CHEST 1 VIEW COMPARISON:  None. FINDINGS: Mixed streaky and patchy opacities are present in lung bases more focally in the retrocardiac/left lung base. No pneumothorax or effusion. The cardiomediastinal contours are unremarkable. No acute osseous or soft tissue abnormality. Degenerative changes are present in the imaged spine and shoulders. Telemetry leads overlie the chest. IMPRESSION: Mixed streaky and patchy opacities in the bases favored to reflect atelectasis though early infection could have a similar appearance. Electronically Signed   By: Lovena Le M.D.   On: 01/22/2020 00:18    Medications:   . sodium chloride   Intravenous Once  . [START  ON 01/23/2020] influenza vaccine adjuvanted  0.5 mL Intramuscular Tomorrow-1000  . pantoprazole (PROTONIX) IV  40 mg Intravenous Q12H  . [START ON 01/23/2020] pneumococcal 23 valent vaccine  0.5 mL Intramuscular Tomorrow-1000   Continuous Infusions: . sodium chloride 75 mL/hr at 01/22/20 0953     LOS: 0 days   Geradine Girt  [facility]   How to contact the [facility] Attending or Consulting provider [place] or covering provider during after hours [place], for this patient?  1. Check the care team in [facility] and look for a) attending/consulting [facility] provider listed and b) the [facility] team listed 2. Log into [email or web address] and use 's universal password to access. If you do not have the password, please contact the hospital operator. 3. Locate the [facility] provider you are looking for under [facility] and page to a number that you can be directly reached. 4. If you still have difficulty reaching the provider, please page the  DOC (Director on Call) for the Hospitalists listed on amion for assistance.  01/22/2020, 11:08 AM

## 2020-01-22 NOTE — Progress Notes (Signed)
Interpreter services Darrick Meigs) used to explain night time medications to patient.  Patient agreeable and had no questions.

## 2020-01-22 NOTE — Progress Notes (Signed)
Attempted to obtain report

## 2020-01-22 NOTE — ED Notes (Signed)
Dr Maudie Mercury admitting doctor  Has been notified of the  pts temp  He report that he suspects that it is the malignancy  And not the blood

## 2020-01-22 NOTE — Progress Notes (Signed)
Initial Nutrition Assessment  DOCUMENTATION CODES:   Not applicable  INTERVENTION:  Ensure Enlive po BID, each supplement provides 350 kcal and 20 grams of protein  Magic cup TID with meals, each supplement provides 290 kcal and 9 grams of protein  MVI with minerals daily   NUTRITION DIAGNOSIS:   Inadequate oral intake related to altered GI function(large necrotic stomach mass concerning for gastric adenocarcinoma) as evidenced by per patient/family report(poor appetite due to early satiety and endorses 65 lb wt loss over the past 3 months).    GOAL:   Patient will meet greater than or equal to 90% of their needs   MONITOR:   PO intake, Weight trends, Diet advancement, Supplement acceptance, Labs, I & O's  REASON FOR ASSESSMENT:   Malnutrition Screening Tool    ASSESSMENT:  RD working remotely.  69 year old male with no pertinent past medical history presented with loss of appetite, weight loss, and black stools. CT abdomen pelvis showed large necrotic mass centered in posterior wall of stomach consistent gastric adenocarcinoma and irregular hypodense mass in right lobe of liver concerning for metastatic disease.   Patient preferred language is Spanish, per notes patient endorses early satiety and loss of appetite that began in November, reports occasionally having abdominal pain and fullness, denies localized severe pain. More recently, patient has noticed getting weak at work with minimal activity with occasional shortness of breath.   Per chart, pt is upset today that he has not been able to eat and does not understand why people are coming in and out of his room.  GI consult pending. Will need further work-up including EGD with biopsy. Patient diet advanced to [place] this afternoon, no documented meal intakes at this time for review. Will provide Ensure and Magic Cup nutrition supplements to aid with calorie/protein needs.   Current wt 126.94 lbs No weight history for  review. Per chart, pt endorses 65 lb wt loss with onset of symptoms in November.  Suspected UBW 192 lbs (per patient's recall of wt loss) Given patients reported history of present illness, highly suspect malnutrition, however unable to identify at this time.   Medications reviewed Labs: Na 134 (L), Albumin 1.4 (L), Corrected Ca 9.48 (WNL), Hgb 8.6 (L)  NUTRITION - FOCUSED PHYSICAL EXAM: Unable to complete at this time, RD working remotely.   Diet Order:   Diet Order            Diet full liquid Room service appropriate? Yes; Fluid consistency: Thin  Diet effective now              EDUCATION NEEDS:   No education needs have been identified at this time  Skin:  Skin Assessment: Reviewed RN Assessment  Last BM:  PTA  Height:   Ht Readings from Last 1 Encounters:  01/22/20 5\' 6"  (1.676 m)    Weight:   Wt Readings from Last 1 Encounters:  01/22/20 57.7 kg    Ideal Body Weight:  64.6 kg  BMI:  Body mass index is 20.53 kg/m.  Estimated Nutritional Needs:   Kcal:  1700-1900  Protein:  85-95  Fluid:  >/= 1.7 L/day   Lajuan Lines, RD, LDN Clinical Nutrition [facility] Telephone [phone] After Hours/Weekend Pager: [phone]

## 2020-01-22 NOTE — ED Notes (Signed)
Pt asking to use the bathroom  Bed side commode offered

## 2020-01-22 NOTE — Progress Notes (Signed)
Used interpreter services to complete questions for patient assessment.  Also used interpreter to ask about scheduled ensure.  Patient stated he did not want.

## 2020-01-22 NOTE — Consult Note (Signed)
Subjective:   HPI  The patient is a 69 year old male who is Spanish speaking. I spoke to him through the translation service ID number [id number]. He came into the hospital because of loss of appetite and a history of significant weight loss. History reviewed. A CT scan showed a large necrotic mass in the posterior wall of the stomach consistent with adenocarcinoma and a lesion worrisome for hepatic metastases so we were asked to see him in regards to EGD and biopsy.  Review of Systems No current complaints History reviewed. No pertinent past medical history. History reviewed. No pertinent surgical history. Social History   Socioeconomic History  . Marital status: Married    Spouse name: Not on file  . Number of children: Not on file  . Years of education: Not on file  . Highest education level: Not on file  Occupational History  . Not on file  Tobacco Use  . Smoking status: Current Every Day Smoker    Types: Cigarettes  . Smokeless tobacco: Never Used  Substance and Sexual Activity  . Alcohol use: Not Currently  . Drug use: Not on file  . Sexual activity: Not on file  Other Topics Concern  . Not on file  Social History Narrative  . Not on file   Social Determinants of Health   Financial Resource Strain:   . Difficulty of Paying Living Expenses: Not on file  Food Insecurity:   . Worried About Charity fundraiser in the Last Year: Not on file  . Ran Out of Food in the Last Year: Not on file  Transportation Needs:   . Lack of Transportation (Medical): Not on file  . Lack of Transportation (Non-Medical): Not on file  Physical Activity:   . Days of Exercise per Week: Not on file  . Minutes of Exercise per Session: Not on file  Stress:   . Feeling of Stress : Not on file  Social Connections:   . Frequency of Communication with Friends and Family: Not on file  . Frequency of Social Gatherings with Friends and Family: Not on file  . Attends Religious Services: Not on file  .  Active Member of Clubs or Organizations: Not on file  . Attends Archivist Meetings: Not on file  . Marital Status: Not on file  Intimate Partner Violence:   . Fear of Current or Ex-Partner: Not on file  . Emotionally Abused: Not on file  . Physically Abused: Not on file  . Sexually Abused: Not on file   Family history is unknown by patient.  Current Facility-Administered Medications:  .  0.9 %  sodium chloride infusion (Manually program via Guardrails IV Fluids), , Intravenous, Once, Plunkett, Whitney, MD .  0.9 %  sodium chloride infusion, , Intravenous, Continuous, Geradine Girt, DO, Last Rate: 75 mL/hr at 01/22/20 0953, Rate Change at 01/22/20 0953 .  acetaminophen (TYLENOL) tablet 650 mg, 650 mg, Oral, Q6H PRN **OR** acetaminophen (TYLENOL) suppository 650 mg, 650 mg, Rectal, Q6H PRN, Jani Gravel, MD .  Derrill Memo ON 01/23/2020] influenza vaccine adjuvanted (FLUAD) injection 0.5 mL, 0.5 mL, Intramuscular, Tomorrow-1000, Vann, Jessica U, DO .  nicotine (NICODERM CQ - dosed in mg/24 hours) patch 14 mg, 14 mg, Transdermal, Daily, Vann, Jessica U, DO .  ondansetron (ZOFRAN) injection 4 mg, 4 mg, Intravenous, Q6H PRN, Jani Gravel, MD .  pantoprazole (PROTONIX) injection 40 mg, 40 mg, Intravenous, Q12H, Jani Gravel, MD, 40 mg at 01/22/20 0953 .  [START ON 01/23/2020] pneumococcal  23 valent vaccine (PNEUMOVAX-23) injection 0.5 mL, 0.5 mL, Intramuscular, Tomorrow-1000, Vann, Jessica U, DO No Known Allergies   Objective:     BP 128/76 (BP Location: Left Arm)   Pulse 79   Temp 98.7 F (37.1 C) (Oral)   Resp 20   Ht 5\' 6"  (1.676 m)   Wt 57.7 kg   SpO2 98%   BMI 20.53 kg/m   No distress  Heart regular rhythm  Lungs clear.  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

## 2020-01-22 NOTE — Plan of Care (Signed)
  Problem: Health Behavior/Discharge Planning: Goal: Ability to manage health-related needs will improve Outcome: Progressing   Problem: Nutrition: Goal: Adequate nutrition will be maintained Outcome: Progressing   Problem: Safety: Goal: Ability to remain free from injury will improve Outcome: Progressing   

## 2020-01-22 NOTE — ED Notes (Signed)
Paged Dr Maudie Mercury to RN Gerald Stabs

## 2020-01-22 NOTE — Progress Notes (Signed)
Notified of 12 beat run of SVT. Patient is resting in bed. Asking for water. MD notified of HR and Low grade temp of 100.5

## 2020-01-22 NOTE — Progress Notes (Signed)
Bedside spanish interpretor used to reinforce why he is still NPO while we await for consult and possible EGD. IV fluids explained and medication purpose. Also made aware that Lab would be coming shortly to draw additional labs to assess how his blood levels have improved post RBCs.

## 2020-01-22 NOTE — ED Notes (Signed)
Report has been given to the rn on 3e

## 2020-01-23 LAB — TYPE AND SCREEN
ABO/RH(D): O POS
Antibody Screen: NEGATIVE
Unit division: 0
Unit division: 0

## 2020-01-23 LAB — COMPREHENSIVE METABOLIC PANEL
ALT: 31 U/L (ref 0–44)
AST: 51 U/L — ABNORMAL HIGH (ref 15–41)
Albumin: 1.2 g/dL — ABNORMAL LOW (ref 3.5–5.0)
Alkaline Phosphatase: 98 U/L (ref 38–126)
Anion gap: 13 (ref 5–15)
BUN: 14 mg/dL (ref 8–23)
CO2: 21 mmol/L — ABNORMAL LOW (ref 22–32)
Calcium: 7.6 mg/dL — ABNORMAL LOW (ref 8.9–10.3)
Chloride: 101 mmol/L (ref 98–111)
Creatinine, Ser: 0.83 mg/dL (ref 0.61–1.24)
GFR calc Af Amer: >60 mL/min (ref >60)
GFR calc non Af Amer: >60 mL/min (ref >60)
Glucose, Bld: 103 mg/dL — ABNORMAL HIGH (ref 70–99)
Potassium: 3.6 mmol/L (ref 3.5–5.1)
Sodium: 135 mmol/L (ref 135–145)
Total Bilirubin: 1 mg/dL (ref 0.3–1.2)
Total Protein: 5.5 g/dL — ABNORMAL LOW (ref 6.5–8.1)

## 2020-01-23 LAB — BPAM RBC
Blood Product Expiration Date: 202103132359
Blood Product Expiration Date: 202103132359
ISSUE DATE / TIME: 202102130000
ISSUE DATE / TIME: 202102130358
Unit Type and Rh: 5100
Unit Type and Rh: 5100

## 2020-01-23 LAB — CBC
HCT: 28.5 % — ABNORMAL LOW (ref 39.0–52.0)
Hemoglobin: 8.8 g/dL — ABNORMAL LOW (ref 13.0–17.0)
MCH: 25.3 pg — ABNORMAL LOW (ref 26.0–34.0)
MCHC: 30.9 g/dL (ref 30.0–36.0)
MCV: 81.9 fL (ref 80.0–100.0)
Platelets: 450 10*3/uL — ABNORMAL HIGH (ref 150–400)
RBC: 3.48 MIL/uL — ABNORMAL LOW (ref 4.22–5.81)
RDW: 17 % — ABNORMAL HIGH (ref 11.5–15.5)
WBC: 9.1 10*3/uL (ref 4.0–10.5)
nRBC: 0 % (ref 0.0–0.2)

## 2020-01-23 NOTE — Progress Notes (Signed)
Progress Note    Kevin [name]  [id number] DOB: 17-Aug-1951  DOA: 01/21/2020 PCP: Patient, No Pcp Per    Brief Narrative:    Medical records reviewed and are as summarized below:  Kevin [name] is an 69 y.o. male smoker presents with difficulty eating and requested someone to check on his belly.  He has had loss of appetite and about 65lbs of weight loss.    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 GI consulted: Dr. Penelope Coop of [facility] -Will need eventual oncology consultation once pathology received  Abnormal liver function Likely secondary to metastatic disease  Tobacco use Patient states he has not smoked since 1998  Fever, low grade Might be from malignancy UA negative No elevation of WBCs X-ray with possible infection versus atelectasis, add incentive spirometer for now and we will consider adding antibiotics pending further fever   Family Communication/Anticipated D/C date and plan/Code Status   DVT prophylaxis: SCDs due to bleeding Code Status: Full Code.  Family Communication:  Disposition Plan: Plan is for EGD with biopsy in the a.m.   Medical Consultants:    GI   Subjective:   Asking to take a shower   Objective:    Vitals:   01/22/20 1638 01/22/20 1933 01/23/20 0057 01/23/20 0549  BP: 128/76 113/71 109/64 110/66  Pulse: 79 96 90 86  Resp: 20 16 20 18   Temp: 98.7 F (37.1 C) 99.4 F (37.4 C) 100 F (37.8 C) 99.7 F (37.6 C)  TempSrc: Oral Oral Oral Oral  SpO2: 98% 94% 96% 95%  Weight:    57.7 kg  Height:         Intake/Output Summary (Last 24 hours) at 01/23/2020 1047 Last data filed at 01/23/2020 0900 Gross per 24 hour  Intake 925.94 ml  Output 950 ml  Net -24.06 ml   Filed Weights   01/22/20 0207 01/23/20 0549  Weight: 57.7 kg 57.7 kg    Exam: In bed, no acute distress  Data Reviewed:   I have personally reviewed following labs and imaging studies:  Labs: Labs show the following:   Basic Metabolic Panel: Recent Labs  Lab 01/21/20 1839 01/21/20 1839 01/22/20 1019 01/23/20 0608  NA 133*  --  134* 135  K 4.2   < > 3.8 3.6  CL 101  --  103 101  CO2 23  --  20* 21*  GLUCOSE 129*  --  97 103*  BUN 18  --  13 14  CREATININE 0.90  --  0.74 0.83  CALCIUM 7.8*  --  7.4* 7.6*  MG  --   --  2.2  --    < > = values in this interval not displayed.   GFR Estimated Creatinine Clearance: 70.5 mL/min (by C-G formula based on SCr of 0.83 mg/dL). Liver Function Tests: Recent Labs  Lab 01/21/20 1839 01/22/20 1019 01/23/20 0608  AST 55* 52* 51*  ALT 31 29 31   ALKPHOS 103 88 98  BILITOT 0.5 1.4* 1.0  PROT 6.3* 5.6* 5.5*  ALBUMIN  1.5* 1.4* 1.2*   Recent Labs  Lab 01/21/20 1839  LIPASE 19   No results for input(s): AMMONIA in the last 168 hours. Coagulation profile No results for input(s): INR, PROTIME in the last 168 hours.  CBC: Recent Labs  Lab 01/21/20 1839 01/22/20 1019 01/23/20 0608  WBC 8.3 8.5 9.1  NEUTROABS 6.2  --   --   HGB 6.2* 8.6* 8.8*  HCT 21.2* 27.0* 28.5*  MCV 78.8* 80.1 81.9  PLT 420* 369 450*   Cardiac Enzymes: No results for input(s): CKTOTAL, CKMB, CKMBINDEX, TROPONINI in the last 168 hours. BNP (last 3 results) No results for input(s): PROBNP in the last 8760 hours. CBG: No results for input(s): GLUCAP in the last 168 hours. D-Dimer: No results for input(s): DDIMER in the last 72 hours. Hgb A1c: No results for input(s): HGBA1C in the last 72 hours. Lipid Profile: No results for input(s): CHOL, HDL, LDLCALC, TRIG, CHOLHDL, LDLDIRECT in  the last 72 hours. Thyroid function studies: No results for input(s): TSH, T4TOTAL, T3FREE, THYROIDAB in the last 72 hours.  Invalid input(s): FREET3 Anemia work up: No results for input(s): VITAMINB12, FOLATE, FERRITIN, TIBC, IRON, RETICCTPCT in the last 72 hours. Sepsis Labs: Recent Labs  Lab 01/21/20 1839 01/22/20 1019 01/23/20 0608  WBC 8.3 8.5 9.1    Microbiology Recent Results (from the past 240 hour(s))  Respiratory Panel by RT PCR (Flu A&B, Covid) - Nasopharyngeal Swab     Status: None   Collection Time: 01/21/20 11:20 PM   Specimen: Nasopharyngeal Swab  Result Value Ref Range Status   SARS Coronavirus 2 by RT PCR NEGATIVE NEGATIVE Final    Comment: (NOTE) SARS-CoV-2 target nucleic acids are NOT DETECTED. The SARS-CoV-2 RNA is generally detectable in upper respiratoy specimens during the acute phase of infection. The lowest concentration of SARS-CoV-2 viral copies this assay can detect is 131 copies/mL. A negative result does not preclude SARS-Cov-2 infection and should not be used as the sole basis for treatment or other patient management decisions. A negative result may occur with  improper specimen collection/handling, submission of specimen other than nasopharyngeal swab, presence of viral mutation(s) within the areas targeted by this assay, and inadequate number of viral copies (<131 copies/mL). A negative result must be combined with clinical observations, patient history, and epidemiological information. The expected result is Negative. Fact Sheet for Patients:  PinkCheek.be Fact Sheet for Healthcare Providers:  GravelBags.it This test is not yet ap proved or cleared by the [place] FDA and  has been authorized for detection and/or diagnosis of SARS-CoV-2 by FDA under an Emergency Use Authorization (EUA). This EUA will remain  in effect (meaning this test can be used) for the duration of  the COVID-19 declaration under Section 564(b)(1) of the Act, 21 U.S.C. section 360bbb-3(b)(1), unless the authorization is terminated or revoked sooner.    Influenza A by PCR NEGATIVE NEGATIVE Final   Influenza B by PCR NEGATIVE NEGATIVE Final    Comment: (NOTE) The Xpert Xpress SARS-CoV-2/FLU/RSV assay is intended as an aid in  the diagnosis of influenza from Nasopharyngeal swab specimens and  should not be used as a sole basis for treatment. Nasal washings and  aspirates are unacceptable for Xpert Xpress SARS-CoV-2/FLU/RSV  testing. Fact Sheet for Patients: PinkCheek.be Fact Sheet for Healthcare Providers: GravelBags.it This test is not yet approved or cleared by the [place] FDA and  has been authorized for detection and/or diagnosis of SARS-CoV-2 by  FDA under an Emergency Use Authorization (EUA).  This EUA will remain  in effect (meaning this test can be used) for the duration of the  Covid-19 declaration under Section 564(b)(1) of the Act, 21  U.S.C. section 360bbb-3(b)(1), unless the authorization is  terminated or revoked. Performed at [facility], [place] [address] [place], [place] [address]     Procedures and diagnostic studies:  CT ABDOMEN PELVIS W CONTRAST  Result Date: 01/21/2020 CLINICAL DATA:  Loss of appetite EXAM: CT ABDOMEN AND PELVIS WITH CONTRAST TECHNIQUE: Multidetector CT imaging of the abdomen and pelvis was performed using the standard protocol following bolus administration of intravenous contrast. CONTRAST:  138mL OMNIPAQUE IOHEXOL 300 MG/ML  SOLN COMPARISON:  None. FINDINGS: Lower chest: No acute pleural or parenchymal lung disease. Hepatobiliary: Hypodense mass right lobe liver measures up to 5.2 x 6.9 cm coarsened for metastatic disease. Gallbladder is unremarkable. Pancreas: Unremarkable. No pancreatic ductal dilatation or surrounding inflammatory changes. Spleen: Normal in size without  focal abnormality. Adrenals/Urinary Tract: Left renal cyst. Areas of right renal cortical scarring are noted. No obstructive uropathy. The adrenals are normal. Bladder is unremarkable. Stomach/Bowel: There is a large necrotic mass centered in the gastric body and antrum, measuring 16.5 by 10 cm in transverse dimension, and extending approximately 17.3 cm in craniocaudal length. Findings are consistent with gastric adenocarcinoma. This mass results and mass effect upon the upper abdominal viscera but I do not see any direct invasion. No bowel obstruction.  Normal appendix. Vascular/Lymphatic: Vascular structures are patent. No critical stenosis. I do not see any discrete adenopathy within the abdomen or pelvis. Reproductive: The prostate is enlarged measuring 5.3 x 5.3 cm in transverse dimension and extending approximately 6.8 cm in craniocaudal length. There is mass effect upon the posterior wall the bladder. Other: No free fluid.  No free intraperitoneal gas. Musculoskeletal: There are no acute or destructive bony lesions. Reconstructed images demonstrate no additional findings. IMPRESSION: 1. Large necrotic mass centered in the posterior wall of the stomach, consistent with gastric adenocarcinoma. Endoscopy is recommended for further evaluation. 2. Irregular hypodense mass right lobe liver, worrisome for metastatic disease. 3. Enlarged prostate. Electronically Signed   By: Randa Ngo M.D.   On: 01/21/2020 21:56   DG CHEST PORT 1 VIEW  Result Date: 01/22/2020 CLINICAL DATA:  Unable to eat, loss of appetite, no abdominal pain EXAM: PORTABLE CHEST 1 VIEW COMPARISON:  None. FINDINGS: Mixed streaky and patchy opacities are present in lung bases more focally in the retrocardiac/left lung base. No pneumothorax or effusion. The cardiomediastinal contours are unremarkable. No acute osseous or soft tissue abnormality. Degenerative changes are present in the imaged spine and shoulders. Telemetry leads overlie the  chest. IMPRESSION: Mixed streaky and patchy opacities in the bases favored to reflect atelectasis though early infection could have a similar appearance. Electronically Signed   By: Lovena Le M.D.   On: 01/22/2020 00:18    Medications:   . sodium chloride   Intravenous Once  . feeding supplement (ENSURE ENLIVE)  237 mL Oral TID BM  . influenza vaccine adjuvanted  0.5 mL Intramuscular Tomorrow-1000  . multivitamin with minerals  1 tablet Oral Daily  . nicotine  14 mg Transdermal Daily  . pantoprazole (PROTONIX) IV  40 mg Intravenous Q12H  . pneumococcal 23 valent vaccine  0.5 mL Intramuscular Tomorrow-1000   Continuous Infusions:    LOS: 1 day   Geradine Girt  [facility]   How to contact the [facility] Attending or Consulting provider [place] or covering provider  during after hours 7P -7A, for this patient?  1. Check the care team in [facility] and look for a) attending/consulting [facility] provider listed and b) the [facility] team listed 2. Log into [email or web address] and use [place]'s universal password to access. If you do not have the password, please contact the hospital operator. 3. Locate the [facility] provider you are looking for under [facility] and page to a number that you can be directly reached. 4. If you still have difficulty reaching the provider, please page the [facility] (Director on Call) for the Hospitalists listed on amion for assistance.  01/23/2020, 10:47 AM

## 2020-01-23 NOTE — Progress Notes (Signed)
We plan for an EGD with biopsy of gastric mass tomorrow

## 2020-01-24 ENCOUNTER — Encounter (HOSPITAL_COMMUNITY)
Admission: EM | Disposition: A | Discharge status: Home Health | Payer: Self-pay | Source: Home / Self Care | Attending: Student

## 2020-01-24 ENCOUNTER — Encounter (HOSPITAL_COMMUNITY): Payer: Self-pay | Admitting: Internal Medicine

## 2020-01-24 ENCOUNTER — Inpatient Hospital Stay (HOSPITAL_COMMUNITY): Payer: Self-pay | Admitting: Certified Registered Nurse Anesthetist

## 2020-01-24 DIAGNOSIS — Z720 Tobacco use: Secondary | ICD-10-CM

## 2020-01-24 DIAGNOSIS — R634 Abnormal weight loss: Secondary | ICD-10-CM | POA: Diagnosis present

## 2020-01-24 HISTORY — PX: BIOPSY: SHX5522

## 2020-01-24 HISTORY — PX: SCLEROTHERAPY: SHX6841

## 2020-01-24 HISTORY — PX: ESOPHAGOGASTRODUODENOSCOPY (EGD) WITH PROPOFOL: SHX5813

## 2020-01-24 LAB — CBC
HCT: 28.2 % — ABNORMAL LOW (ref 39.0–52.0)
Hemoglobin: 8.7 g/dL — ABNORMAL LOW (ref 13.0–17.0)
MCH: 25.3 pg — ABNORMAL LOW (ref 26.0–34.0)
MCHC: 30.9 g/dL (ref 30.0–36.0)
MCV: 82 fL (ref 80.0–100.0)
Platelets: 445 10*3/uL — ABNORMAL HIGH (ref 150–400)
RBC: 3.44 MIL/uL — ABNORMAL LOW (ref 4.22–5.81)
RDW: 17.2 % — ABNORMAL HIGH (ref 11.5–15.5)
WBC: 10.2 10*3/uL (ref 4.0–10.5)
nRBC: 0 % (ref 0.0–0.2)

## 2020-01-24 SURGERY — ESOPHAGOGASTRODUODENOSCOPY (EGD) WITH PROPOFOL
Anesthesia: Monitor Anesthesia Care

## 2020-01-24 MED ORDER — PROPOFOL 10 MG/ML IV BOLUS
INTRAVENOUS | Status: DC | PRN
  Administered 2020-01-24: 15 mg via INTRAVENOUS
  Administered 2020-01-24: 25 mg via INTRAVENOUS

## 2020-01-24 MED ORDER — LACTATED RINGERS IV SOLN: INTRAVENOUS | Status: DC | PRN

## 2020-01-24 MED ORDER — PROPOFOL 500 MG/50ML IV EMUL
INTRAVENOUS | Status: DC | PRN
  Administered 2020-01-24: 130 ug/kg/min via INTRAVENOUS

## 2020-01-24 SURGICAL SUPPLY — 15 items

## 2020-01-24 NOTE — Plan of Care (Signed)
  Problem: Safety: Goal: Ability to remain free from injury will improve Outcome: Progressing   

## 2020-01-24 NOTE — Op Note (Addendum)
[facility] Patient Name: Kevin [name] Procedure Date : 01/24/2020 MRN: [id number] Attending MD: Wonda Horner , MD Date of Birth: [birth year] CSN: [id number] Age: 69 Admit Type: Inpatient Procedure:                Upper GI endoscopy Indications:              Abnormal CT of the GI tract Providers:                Wonda Horner, MD, Rozell Searing, Technician Referring MD:              Medicines:                Propofol per Anesthesia Complications:            Moderate bleeding Estimated Blood Loss:      Procedure:                Pre-Anesthesia Assessment:                           - Prior to the procedure, a History and Physical                            was performed, and patient medications and                            allergies were reviewed. The patient's tolerance of                            previous anesthesia was also reviewed. The risks                            and benefits of the procedure and the sedation                            options and risks were discussed with the patient.                            All questions were answered, and informed consent                            was obtained. Prior Anticoagulants: The patient has                            taken no previous anticoagulant or antiplatelet                            agents. ASA Grade Assessment: II - A patient with                            mild systemic disease. After reviewing the risks  and benefits, the patient was deemed in                            satisfactory condition to undergo the procedure.                           After obtaining informed consent, the endoscope was                            passed under direct vision. Throughout the                            procedure, the patient's blood pressure, pulse, and                            oxygen saturations were monitored continuously. The                  [id number]) Olympus gastroscope was                            introduced through the mouth, and advanced to the                            second part of duodenum. The upper GI endoscopy was                            accomplished without difficulty. The patient                            tolerated the procedure well. Scope In: Scope Out: Findings:      The examined esophagus was normal.      A large, infiltrative mass with no bleeding was found in the posterior       wall of stomach. upper stomach. coresponds to area on CT scan. Biopsies       were taken with a cold forceps for histology. Biopsies were taken with a       cold forceps for histology. Tumor necrotic and friable with some       bleeding post biopsy. Area was. injected with 3 mL of a 1:10,000       solution of epinephrine for hemostasis.      The examined duodenum was normal. Impression:               - Normal esophagus.                           - Malignant gastric tumor. Biopsied.                           - Normal examined duodenum. Recommendation:           - Clear liquid diet.                           - Continue present medications.                           -  Await pathology results. Procedure Code(s):        --- Professional ---                           [phone], 63, Esophagogastroduodenoscopy, flexible,                            transoral; with control of bleeding, any method                           [id number], Esophagogastroduodenoscopy, flexible,                            transoral; with biopsy, single or multiple Diagnosis Code(s):        --- Professional ---                           C16.9, Malignant neoplasm of stomach, unspecified                           R93.3, Abnormal findings on diagnostic imaging of                            other parts of digestive tract CPT copyright 2019 American Medical Association. All rights reserved. The codes documented in this report are preliminary and  upon coder review may  be revised to meet current compliance requirements. Wonda Horner, MD 01/24/2020 12:26:15 PM This report has been signed electronically. Number of Addenda: 0

## 2020-01-24 NOTE — Anesthesia Preprocedure Evaluation (Addendum)
Anesthesia Evaluation  Patient identified by MRN, date of birth, ID band Patient awake    Reviewed: Allergy & Precautions, NPO status , Patient's Chart, lab work & pertinent test results  Airway Mallampati: II  TM Distance: >3 FB Neck ROM: Full    Dental  (+) Poor Dentition, Missing   Pulmonary Current Smoker and Patient abstained from smoking.,    Pulmonary exam normal breath sounds clear to auscultation       Cardiovascular negative cardio ROS Normal cardiovascular exam Rhythm:Regular Rate:Normal     Neuro/Psych negative neurological ROS  negative psych ROS   GI/Hepatic negative GI ROS, Neg liver ROS,   Endo/Other  negative endocrine ROS  Renal/GU negative Renal ROS     Musculoskeletal negative musculoskeletal ROS (+)   Abdominal   Peds  Hematology  (+) anemia ,   Anesthesia Other Findings gastric mass  Reproductive/Obstetrics                            Anesthesia Physical Anesthesia Plan  ASA: II  Anesthesia Plan: MAC   Post-op Pain Management:    Induction: Intravenous  PONV Risk Score and Plan: 0 and Propofol infusion and Treatment may vary due to age or medical condition  Airway Management Planned: Nasal Cannula  Additional Equipment:   Intra-op Plan:   Post-operative Plan:   Informed Consent: I have reviewed the patients History and Physical, chart, labs and discussed the procedure including the risks, benefits and alternatives for the proposed anesthesia with the patient or authorized representative who has indicated his/her understanding and acceptance.     Dental advisory given  Plan Discussed with: CRNA  Anesthesia Plan Comments: (Stratus video interpretation utilized )       Anesthesia Quick Evaluation

## 2020-01-24 NOTE — Transfer of Care (Signed)
Immediate Anesthesia Transfer of Care Note  Patient: Kevin [name]  Procedure(s) Performed: ESOPHAGOGASTRODUODENOSCOPY (EGD) WITH PROPOFOL (N/A ) BIOPSY  Patient Location: Endoscopy Unit  Anesthesia Type:MAC  Level of Consciousness: awake, alert  and patient cooperative  Airway & Oxygen Therapy: Patient Spontanous Breathing and Patient connected to nasal cannula oxygen  Post-op Assessment: Report given to RN and Post -op Vital signs reviewed and stable  Post vital signs: Reviewed and stable  Last Vitals:  Vitals Value Taken Time  BP    Temp    Pulse 91 01/24/20 1207  Resp 20 01/24/20 1207  SpO2 100 % 01/24/20 1207  Vitals shown include unvalidated device data.  Last Pain:  Vitals:   01/24/20 1047  TempSrc: Temporal  PainSc: 0-No pain         Complications: No apparent anesthesia complications

## 2020-01-24 NOTE — Care Management (Signed)
01/24/2020 - 1441:  Care Management in assistance with [place], [place] interpreter, met with the patient to discuss transition needs.  Permission granted from patient to assistance with access of PCP and pharmacy medication assistance.  The patient lives at home with his wife and children.  The patient speaks very little english at this time.  Patient with no PCP and granted permission to obtain appointment with [facility] and patient was given instructions that he will be able to obtain medications through the [facility] and [place].  The patient drives at this point and verbalizes no need for transportation assistance.

## 2020-01-24 NOTE — Progress Notes (Signed)
PROGRESS NOTE  Kevin [name] [id number] DOB: [birth year] DOA: 01/21/2020 PCP: Patient, No Pcp Per  HPI/Recap of past 70 hours: 69 year old Spanish-speaking male with past medical history of tobacco abuse presented to the emergency room on 2/12 with difficulty eating and reports of unintentional weight loss of about 65 pounds.  He had reported black stool.  The emergency room, a CT scan of the abdomen pelvis noted a large necrotic mass centered in the posterior wall of the stomach consistent with gastric adenocarcinoma as well as a irregular hypodense mass in the right lobe of the liver concerning for metastatic disease.  Patient's hemoglobin on admission noted to be at 6.2 with a normal MCV.  Patient transfused 2 units packed red blood cells started on IV PPI and gastroenterology consulted.  Admitted to the hospitalist service.  Patient seen by GI and taken for endoscopy on 2/15.  Biopsies taken.  Patient seen prior to endoscopy.  Using translator, he reports that he is just very hungry and is worried that he will never see his grandchildren again if he goes for endoscopy.  After reassurance, he is willing to go.  Denies any pain or shortness of breath.  Assessment/Plan: Principal Problem:   Gastric mass, highly suspicious for malignant gastric tumor: Status post biopsies.  Pathology pending. Active Problems:   Anemia: Possibly from tumor bleed versus anemia of disease: Status post 2 unit packed red blood cell transfusion.  Hemoglobin has been stable since.    Weight loss, unintentional: Seen by nutrition.  Appreciate nutrition recommendations  Tobacco abuse: Counseled.  Nicotine patch.  Code Status: Full code  Family Communication: Updated wife by phone   Disposition Plan: Awaiting pathology and will discuss with oncology.  Once plan in place, can discharge home   Consultants:  GI  Oncology  Procedures:  Status post EGD done 2/15: Biopsies of gastric mass taken.  Results  pending.  Status post 2 unit packed red blood cell transfusion done 2/13  Antimicrobials:  None  DVT prophylaxis: SCDs   Objective: Vitals:   01/24/20 1230 01/24/20 1247  BP: (!) 95/47 117/68  Pulse:  86  Resp: 18   Temp:  98.2 F (36.8 C)  SpO2: 99% 98%    Intake/Output Summary (Last 24 hours) at 01/24/2020 1301 Last data filed at 01/24/2020 1202 Gross per 24 hour  Intake 560 ml  Output 850 ml  Net -290 ml   Filed Weights   01/22/20 0207 01/23/20 0549 01/24/20 0428  Weight: 57.7 kg 57.7 kg 56.5 kg   Body mass index is 20.1 kg/m.  Exam:   General: Alert and oriented x3, no acute distress  HEENT: Normocephalic and atraumatic, mucous membranes slightly dry  Cardiovascular: Regular rate and rhythm, S1-S2  Respiratory: Clear to auscultation bilaterally  Abdomen: Soft, nontender, nondistended, positive bowel sounds  Musculoskeletal: No clubbing or cyanosis or edema  Skin: No skin breaks, tears or lesions  Psychiatry: Appropriate, no evidence of psychoses   Data Reviewed: CBC: Recent Labs  Lab 01/21/20 1839 01/22/20 1019 01/23/20 0608  WBC 8.3 8.5 9.1  NEUTROABS 6.2  --   --   HGB 6.2* 8.6* 8.8*  HCT 21.2* 27.0* 28.5*  MCV 78.8* 80.1 81.9  PLT 420* 369 [id number]*   Basic Metabolic Panel: Recent Labs  Lab 01/21/20 1839 01/22/20 1019 01/23/20 0608  NA 133* 134* 135  K 4.2 3.8 3.6  CL 101 103 101  CO2 23 20* 21*  GLUCOSE 129* 97 103*  BUN 18 13 14  CREATININE 0.90 0.74 0.83  CALCIUM 7.8* 7.4* 7.6*  MG  --  2.2  --    GFR: Estimated Creatinine Clearance: 69 mL/min (by C-G formula based on SCr of 0.83 mg/dL). Liver Function Tests: Recent Labs  Lab 01/21/20 1839 01/22/20 1019 01/23/20 0608  AST 55* 52* 51*  ALT 31 29 31   ALKPHOS 103 88 98  BILITOT 0.5 1.4* 1.0  PROT 6.3* 5.6* 5.5*  ALBUMIN 1.5* 1.4* 1.2*   Recent Labs  Lab 01/21/20 1839  LIPASE 19   No results for input(s): AMMONIA in the last 168 hours. Coagulation Profile: No  results for input(s): INR, PROTIME in the last 168 hours. Cardiac Enzymes: No results for input(s): CKTOTAL, CKMB, CKMBINDEX, TROPONINI in the last 168 hours. BNP (last 3 results) No results for input(s): PROBNP in the last 8760 hours. HbA1C: No results for input(s): HGBA1C in the last 72 hours. CBG: No results for input(s): GLUCAP in the last 168 hours. Lipid Profile: No results for input(s): CHOL, HDL, LDLCALC, TRIG, CHOLHDL, LDLDIRECT in the last 72 hours. Thyroid Function Tests: No results for input(s): TSH, T4TOTAL, FREET4, T3FREE, THYROIDAB in the last 72 hours. Anemia Panel: No results for input(s): VITAMINB12, FOLATE, FERRITIN, TIBC, IRON, RETICCTPCT in the last 72 hours. Urine analysis:    Component Value Date/Time   COLORURINE YELLOW 01/22/2020 0418   APPEARANCEUR CLEAR 01/22/2020 0418   LABSPEC 1.040 (H) 01/22/2020 0418   PHURINE 5.0 01/22/2020 0418   GLUCOSEU NEGATIVE 01/22/2020 0418   HGBUR NEGATIVE 01/22/2020 0418   BILIRUBINUR NEGATIVE 01/22/2020 0418   KETONESUR NEGATIVE 01/22/2020 0418   PROTEINUR NEGATIVE 01/22/2020 0418   NITRITE NEGATIVE 01/22/2020 0418   LEUKOCYTESUR NEGATIVE 01/22/2020 0418   Sepsis Labs: @LABRCNTIP (procalcitonin:4,lacticidven:4)  ) Recent Results (from the past 240 hour(s))  Respiratory Panel by RT PCR (Flu A&B, Covid) - Nasopharyngeal Swab     Status: None   Collection Time: 01/21/20 11:20 PM   Specimen: Nasopharyngeal Swab  Result Value Ref Range Status   SARS Coronavirus 2 by RT PCR NEGATIVE NEGATIVE Final    Comment: (NOTE) SARS-CoV-2 target nucleic acids are NOT DETECTED. The SARS-CoV-2 RNA is generally detectable in upper respiratoy specimens during the acute phase of infection. The lowest concentration of SARS-CoV-2 viral copies this assay can detect is 131 copies/mL. A negative result does not preclude SARS-Cov-2 infection and should not be used as the sole basis for treatment or other patient management decisions. A  negative result may occur with  improper specimen collection/handling, submission of specimen other than nasopharyngeal swab, presence of viral mutation(s) within the areas targeted by this assay, and inadequate number of viral copies (<131 copies/mL). A negative result must be combined with clinical observations, patient history, and epidemiological information. The expected result is Negative. Fact Sheet for Patients:  PinkCheek.be Fact Sheet for Healthcare Providers:  GravelBags.it This test is not yet ap proved or cleared by the [place] FDA and  has been authorized for detection and/or diagnosis of SARS-CoV-2 by FDA under an Emergency Use Authorization (EUA). This EUA will remain  in effect (meaning this test can be used) for the duration of the COVID-19 declaration under Section 564(b)(1) of the Act, 21 U.S.C. section 360bbb-3(b)(1), unless the authorization is terminated or revoked sooner.    Influenza A by PCR NEGATIVE NEGATIVE Final   Influenza B by PCR NEGATIVE NEGATIVE Final    Comment: (NOTE) The Xpert Xpress SARS-CoV-2/FLU/RSV assay is intended as an aid in  the diagnosis of influenza from  Nasopharyngeal swab specimens and  should not be used as a sole basis for treatment. Nasal washings and  aspirates are unacceptable for Xpert Xpress SARS-CoV-2/FLU/RSV  testing. Fact Sheet for Patients: PinkCheek.be Fact Sheet for Healthcare Providers: GravelBags.it This test is not yet approved or cleared by the [place] FDA and  has been authorized for detection and/or diagnosis of SARS-CoV-2 by  FDA under an Emergency Use Authorization (EUA). This EUA will remain  in effect (meaning this test can be used) for the duration of the  Covid-19 declaration under Section 564(b)(1) of the Act, 21  U.S.C. section 360bbb-3(b)(1), unless the authorization is    terminated or revoked. Performed at [facility], [place] [address], [place], [place] [address]       Studies: No results found.  Scheduled Meds: . sodium chloride   Intravenous Once  . feeding supplement (ENSURE ENLIVE)  237 mL Oral TID BM  . influenza vaccine adjuvanted  0.5 mL Intramuscular Tomorrow-1000  . multivitamin with minerals  1 tablet Oral Daily  . nicotine  14 mg Transdermal Daily  . pantoprazole (PROTONIX) IV  40 mg Intravenous Q12H  . pneumococcal 23 valent vaccine  0.5 mL Intramuscular Tomorrow-1000    Continuous Infusions:   LOS: 2 days     Annita Brod, MD [facility]   01/24/2020, 1:01 PM

## 2020-01-24 NOTE — Progress Notes (Signed)
Pt. Refused morning lab draw.

## 2020-01-24 NOTE — H&P (Signed)
Patient here for egd to bx gastric mass Heart RRR, lungs clear, Abdomen non tender. IMP gastric mass Plan EGD

## 2020-01-25 ENCOUNTER — Other Ambulatory Visit: Payer: Self-pay

## 2020-01-25 NOTE — Progress Notes (Signed)
PROGRESS NOTE  Kevin [name] [id number] DOB: [birth year] DOA: 01/21/2020 PCP: Patient, No Pcp Per  HPI/Recap of past 37 hours: 69 year old Spanish-speaking male with past medical history of tobacco abuse presented to the emergency room on 2/12 with difficulty eating and reports of unintentional weight loss of about 65 pounds.  He had reported black stool.  The emergency room, a CT scan of the abdomen pelvis noted a large necrotic mass centered in the posterior wall of the stomach consistent with gastric adenocarcinoma as well as a irregular hypodense mass in the right lobe of the liver concerning for metastatic disease.  Patient's hemoglobin on admission noted to be at 6.2 with a normal MCV.  Patient transfused 2 units packed red blood cells started on IV PPI and gastroenterology consulted.  Admitted to the hospitalist service.  Patient seen by GI and taken for endoscopy on 2/15.  Biopsies taken.    Today, patient is doing okay.  No pain or trouble breathing.  He has no complaints.  He would like to eat solid food.  Assessment/Plan: Principal Problem:   Gastric mass, highly suspicious for malignant gastric tumor: Status post biopsies.  Pathology pending. Active Problems:   Anemia: Possibly from tumor bleed versus anemia of disease: Status post 2 unit packed red blood cell transfusion.  Hemoglobin has been stable since.    Weight loss, unintentional: Seen by nutrition.  Appreciate nutrition recommendations  Tobacco abuse: Counseled.  Nicotine patch.  Code Status: Full code  Family Communication: Updated wife by phone   Disposition Plan: Awaiting pathology and will discuss with oncology.  Once plan in place, can discharge home   Consultants:  GI  Oncology  Procedures:  Status post EGD done 2/15: Biopsies of gastric mass taken.  Results pending.  Status post 2 unit packed red blood cell transfusion done 2/13  Antimicrobials:  None  DVT  prophylaxis: SCDs   Objective: Vitals:   01/24/20 2000 01/25/20 0420  BP: 109/64 105/65  Pulse: 99 85  Resp: 18 18  Temp: 98.2 F (36.8 C) 98.3 F (36.8 C)  SpO2: 97% 95%    Intake/Output Summary (Last 24 hours) at 01/25/2020 1224 Last data filed at 01/25/2020 0421 Gross per 24 hour  Intake 240 ml  Output 200 ml  Net 40 ml   Filed Weights   01/23/20 0549 01/24/20 0428 01/25/20 0420  Weight: 57.7 kg 56.5 kg 56.1 kg   Body mass index is 19.96 kg/m.  Exam:   General: Alert and oriented x3, no acute distress  HEENT: Normocephalic and atraumatic, mucous membranes slightly dry  Cardiovascular: Regular rate and rhythm, S1-S2  Respiratory: Clear to auscultation bilaterally  Abdomen: Soft, nontender, nondistended, positive bowel sounds  Musculoskeletal: No clubbing or cyanosis or edema  Skin: No skin breaks, tears or lesions  Psychiatry: Appropriate, no evidence of psychoses   Data Reviewed: CBC: Recent Labs  Lab 01/21/20 1839 01/22/20 1019 01/23/20 0608 01/24/20 1327  WBC 8.3 8.5 9.1 10.2  NEUTROABS 6.2  --   --   --   HGB 6.2* 8.6* 8.8* 8.7*  HCT 21.2* 27.0* 28.5* 28.2*  MCV 78.8* 80.1 81.9 82.0  PLT 420* 369 450* [id number]*   Basic Metabolic Panel: Recent Labs  Lab 01/21/20 1839 01/22/20 1019 01/23/20 0608  NA 133* 134* 135  K 4.2 3.8 3.6  CL 101 103 101  CO2 23 20* 21*  GLUCOSE 129* 97 103*  BUN 18 13 14   CREATININE 0.90 0.74 0.83  CALCIUM 7.8* 7.4* 7.6*  MG  --  2.2  --    GFR: Estimated Creatinine Clearance: 68.5 mL/min (by C-G formula based on SCr of 0.83 mg/dL). Liver Function Tests: Recent Labs  Lab 01/21/20 1839 01/22/20 1019 01/23/20 0608  AST 55* 52* 51*  ALT 31 29 31   ALKPHOS 103 88 98  BILITOT 0.5 1.4* 1.0  PROT 6.3* 5.6* 5.5*  ALBUMIN 1.5* 1.4* 1.2*   Recent Labs  Lab 01/21/20 1839  LIPASE 19   No results for input(s): AMMONIA in the last 168 hours. Coagulation Profile: No results for input(s): INR, PROTIME in the  last 168 hours. Cardiac Enzymes: No results for input(s): CKTOTAL, CKMB, CKMBINDEX, TROPONINI in the last 168 hours. BNP (last 3 results) No results for input(s): PROBNP in the last 8760 hours. HbA1C: No results for input(s): HGBA1C in the last 72 hours. CBG: No results for input(s): GLUCAP in the last 168 hours. Lipid Profile: No results for input(s): CHOL, HDL, LDLCALC, TRIG, CHOLHDL, LDLDIRECT in the last 72 hours. Thyroid Function Tests: No results for input(s): TSH, T4TOTAL, FREET4, T3FREE, THYROIDAB in the last 72 hours. Anemia Panel: No results for input(s): VITAMINB12, FOLATE, FERRITIN, TIBC, IRON, RETICCTPCT in the last 72 hours. Urine analysis:    Component Value Date/Time   COLORURINE YELLOW 01/22/2020 0418   APPEARANCEUR CLEAR 01/22/2020 0418   LABSPEC 1.040 (H) 01/22/2020 0418   PHURINE 5.0 01/22/2020 0418   GLUCOSEU NEGATIVE 01/22/2020 0418   HGBUR NEGATIVE 01/22/2020 0418   BILIRUBINUR NEGATIVE 01/22/2020 0418   KETONESUR NEGATIVE 01/22/2020 0418   PROTEINUR NEGATIVE 01/22/2020 0418   NITRITE NEGATIVE 01/22/2020 0418   LEUKOCYTESUR NEGATIVE 01/22/2020 0418   Sepsis Labs: @LABRCNTIP (procalcitonin:4,lacticidven:4)  ) Recent Results (from the past 240 hour(s))  Respiratory Panel by RT PCR (Flu A&B, Covid) - Nasopharyngeal Swab     Status: None   Collection Time: 01/21/20 11:20 PM   Specimen: Nasopharyngeal Swab  Result Value Ref Range Status   SARS Coronavirus 2 by RT PCR NEGATIVE NEGATIVE Final    Comment: (NOTE) SARS-CoV-2 target nucleic acids are NOT DETECTED. The SARS-CoV-2 RNA is generally detectable in upper respiratoy specimens during the acute phase of infection. The lowest concentration of SARS-CoV-2 viral copies this assay can detect is 131 copies/mL. A negative result does not preclude SARS-Cov-2 infection and should not be used as the sole basis for treatment or other patient management decisions. A negative result may occur with  improper  specimen collection/handling, submission of specimen other than nasopharyngeal swab, presence of viral mutation(s) within the areas targeted by this assay, and inadequate number of viral copies (<131 copies/mL). A negative result must be combined with clinical observations, patient history, and epidemiological information. The expected result is Negative. Fact Sheet for Patients:  PinkCheek.be Fact Sheet for Healthcare Providers:  GravelBags.it This test is not yet ap proved or cleared by the [place] FDA and  has been authorized for detection and/or diagnosis of SARS-CoV-2 by FDA under an Emergency Use Authorization (EUA). This EUA will remain  in effect (meaning this test can be used) for the duration of the COVID-19 declaration under Section 564(b)(1) of the Act, 21 U.S.C. section 360bbb-3(b)(1), unless the authorization is terminated or revoked sooner.    Influenza A by PCR NEGATIVE NEGATIVE Final   Influenza B by PCR NEGATIVE NEGATIVE Final    Comment: (NOTE) The Xpert Xpress SARS-CoV-2/FLU/RSV assay is intended as an aid in  the diagnosis of influenza from Nasopharyngeal swab specimens and  should not be used as  a sole basis for treatment. Nasal washings and  aspirates are unacceptable for Xpert Xpress SARS-CoV-2/FLU/RSV  testing. Fact Sheet for Patients: PinkCheek.be Fact Sheet for Healthcare Providers: GravelBags.it This test is not yet approved or cleared by the [place] FDA and  has been authorized for detection and/or diagnosis of SARS-CoV-2 by  FDA under an Emergency Use Authorization (EUA). This EUA will remain  in effect (meaning this test can be used) for the duration of the  Covid-19 declaration under Section 564(b)(1) of the Act, 21  U.S.C. section 360bbb-3(b)(1), unless the authorization is  terminated or revoked. Performed at [facility], [place] [address], [place], [place] [address]       Studies: No results found.  Scheduled Meds: . sodium chloride   Intravenous Once  . feeding supplement (ENSURE ENLIVE)  237 mL Oral TID BM  . influenza vaccine adjuvanted  0.5 mL Intramuscular Tomorrow-1000  . multivitamin with minerals  1 tablet Oral Daily  . nicotine  14 mg Transdermal Daily  . pantoprazole (PROTONIX) IV  40 mg Intravenous Q12H  . pneumococcal 23 valent vaccine  0.5 mL Intramuscular Tomorrow-1000    Continuous Infusions:   LOS: 3 days     Annita Brod, MD [facility]   01/25/2020, 12:24 PM

## 2020-01-25 NOTE — Anesthesia Postprocedure Evaluation (Signed)
Anesthesia Post Note  Patient: Morrill Bomkamp  Procedure(s) Performed: ESOPHAGOGASTRODUODENOSCOPY (EGD) WITH PROPOFOL (N/A ) BIOPSY     Patient location during evaluation: Endoscopy Anesthesia Type: MAC Level of consciousness: awake and alert Pain management: pain level controlled Vital Signs Assessment: post-procedure vital signs reviewed and stable Respiratory status: spontaneous breathing, nonlabored ventilation, respiratory function stable and patient connected to nasal cannula oxygen Cardiovascular status: blood pressure returned to baseline and stable Postop Assessment: no apparent nausea or vomiting Anesthetic complications: no    Last Vitals:  Vitals:   01/24/20 2000 01/25/20 0420  BP: 109/64 105/65  Pulse: 99 85  Resp: 18 18  Temp: 36.8 C 36.8 C  SpO2: 97% 95%    Last Pain:  Vitals:   01/25/20 0420  TempSrc: Oral  PainSc:                  [place]

## 2020-01-25 NOTE — Progress Notes (Signed)
No complaints today. We are awaiting gastric biopsies results before making further decisions.

## 2020-01-26 DIAGNOSIS — F17200 Nicotine dependence, unspecified, uncomplicated: Secondary | ICD-10-CM

## 2020-01-26 DIAGNOSIS — R634 Abnormal weight loss: Secondary | ICD-10-CM

## 2020-01-26 DIAGNOSIS — D500 Iron deficiency anemia secondary to blood loss (chronic): Secondary | ICD-10-CM

## 2020-01-26 DIAGNOSIS — A048 Other specified bacterial intestinal infections: Secondary | ICD-10-CM

## 2020-01-26 DIAGNOSIS — K2951 Unspecified chronic gastritis with bleeding: Secondary | ICD-10-CM

## 2020-01-26 DIAGNOSIS — K769 Liver disease, unspecified: Secondary | ICD-10-CM

## 2020-01-26 LAB — IRON AND TIBC
Iron: 10 ug/dL — ABNORMAL LOW (ref 45–182)
Saturation Ratios: 7 % — ABNORMAL LOW (ref 17.9–39.5)
TIBC: 143 ug/dL — ABNORMAL LOW (ref 250–450)
UIBC: 133 ug/dL

## 2020-01-26 LAB — SURGICAL PATHOLOGY

## 2020-01-26 LAB — RETICULOCYTES
Immature Retic Fract: 15.7 % (ref 2.3–15.9)
RBC.: 3.27 MIL/uL — ABNORMAL LOW (ref 4.22–5.81)
Retic Count, Absolute: 32 10*3/uL (ref 19.0–186.0)
Retic Ct Pct: 1 % (ref 0.4–3.1)

## 2020-01-26 LAB — FERRITIN: Ferritin: 507 ng/mL — ABNORMAL HIGH (ref 24–336)

## 2020-01-26 LAB — VITAMIN B12: Vitamin B-12: 1177 pg/mL — ABNORMAL HIGH (ref 180–914)

## 2020-01-26 LAB — FOLATE: Folate: 16.1 ng/mL (ref >5.9)

## 2020-01-26 MED ORDER — PANTOPRAZOLE SODIUM 40 MG PO TBEC
40.0000 mg | DELAYED_RELEASE_TABLET | Freq: Two times a day (BID) | ORAL | Status: DC
  Administered 2020-01-26 – 2020-02-03 (×17): 40 mg via ORAL
  Filled 2020-01-26 (×17): qty 1

## 2020-01-26 NOTE — Plan of Care (Signed)
  Problem: Activity: Goal: Risk for activity intolerance will decrease Outcome: Progressing   Problem: Nutrition: Goal: Adequate nutrition will be maintained Outcome: Progressing   Problem: Safety: Goal: Ability to remain free from injury will improve Outcome: Progressing   

## 2020-01-26 NOTE — Progress Notes (Signed)
Nutrition Follow-up  DOCUMENTATION CODES:   Not applicable  INTERVENTION:   -Continue Ensure Enlive po TID, each supplement provides 350 kcal and 20 grams of protein -Continue MVI with minerals daily  NUTRITION DIAGNOSIS:   Inadequate oral intake related to altered GI function(large necrotic stomach mass concerning for gastric adenocarcinoma) as evidenced by per patient/family report(poor appetite due to early satiety and endorses 65 lb wt loss over the past 3 months).  Ongoing  GOAL:   Patient will meet greater than or equal to 90% of their needs  Progressing   MONITOR:   PO intake, Weight trends, Diet advancement, Supplement acceptance, Labs, I & O's  REASON FOR ASSESSMENT:   Malnutrition Screening Tool    ASSESSMENT:   69 year old male with no pertinent past medical history presented with loss of appetite, weight loss, and black stools. CT abdomen pelvis showed large necrotic mass centered in posterior wall of stomach consistent gastric adenocarcinoma and irregular hypodense mass in right lobe of liver concerning for metastatic disease.  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 Plan oncology consultation tomorrow.   Pt resting quietly in recliner at time of visit. Intake has improved since admission; noted meal completion 50-100%. He is consuming Ensure supplements.   Labs reviewed.   Diet Order:   Diet Order            Diet regular Room service appropriate? Yes; Fluid consistency: Thin  Diet effective now              EDUCATION NEEDS:   No education needs have been identified at this time  Skin:  Skin Assessment: Reviewed RN Assessment  Last BM:   01/25/20  Height:   Ht Readings from Last 1 Encounters:  01/22/20 5\' 6"  (1.676 m)    Weight:   Wt Readings from Last 1 Encounters:  01/26/20 55.3 kg    Ideal Body Weight:  64.6 kg  BMI:  Body mass index is 19.68 kg/m.  Estimated Nutritional Needs:   Kcal:  [id number]  Protein:  85-100 grams  Fluid:  > 1.7 L    Loistine Chance, RD, LDN, [place] Registered Dietitian II Certified Diabetes Care and Education Specialist Please refer to [facility] for RD and/or RD on-call/weekend/after hours pager

## 2020-01-26 NOTE — Progress Notes (Signed)
PROGRESS NOTE  Kevin [name] [id number] DOB: 07-18-1951   PCP: Patient, No Pcp Per   Patient is from: home  DOA: 01/21/2020 LOS: 4  Brief Narrative / Interim history: 69 year old Spanish male with history of tobacco use disorder presented to ED 2/12 with difficulty eating and unintentional weight loss of about 65 pounds.  Also had melanotic stool.  In ED, CT abdomen and pelvis revealed large necrotic mass in the posterior wall of the stomach and irregular hypodense mass in the right lobe of the liver concerning for metastatic disease.  Hgb 6.2 with normal MCV.   Patient received 2 units of packed red blood cells with appropriate response.  Started on PPI.  GI consulted, and he had EGD and biopsy of the mass on 2/15.  Pathology consistent with gastric stromal tumor.  Also positive for H. pylori infection.  General surgery consulted and recommended neoadjuvant Gleevec prior to resection.  Oncology consulted  Subjective: No major events overnight or this morning.  No complaint this morning.  He denies chest pain, dyspnea, nausea, vomiting and abdominal pain.  He says he has a normal appetite this morning.  He ate well yesterday.  Video interpreter with ID number [id number] used for this encounter. Objective: Vitals:   01/25/20 2003 01/26/20 0421 01/26/20 0816 01/26/20 1241  BP: 109/65 94/61 105/63 117/66  Pulse: 97 75 86 96  Resp: 18 18 17 18   Temp: 100 F (37.8 C) 98.3 F (36.8 C) 100.1 F (37.8 C) 100.1 F (37.8 C)  TempSrc: Oral Oral Oral Oral  SpO2: 95% 100% 94% 96%  Weight:  55.3 kg    Height:        Intake/Output Summary (Last 24 hours) at 01/26/2020 1401 Last data filed at 01/26/2020 1300 Gross per 24 hour  Intake 480 ml  Output 650 ml  Net -170 ml   Filed Weights   01/24/20 0428 01/25/20 0420 01/26/20 0421  Weight: 56.5 kg 56.1 kg 55.3 kg    Examination:  GENERAL: No acute distress.  Appears well.  HEENT: MMM.  Vision and hearing grossly intact.  NECK: Supple.   No apparent JVD.  RESP:  No IWOB. Good air movement bilaterally. CVS:  RRR. Heart sounds normal.  ABD/GI/GU: Bowel sounds present. Soft. Non tender.  MSK/EXT:  Moves extremities. No apparent deformity. No edema.  SKIN: no apparent skin lesion or wound NEURO: Awake, alert and oriented appropriately.  No apparent focal neuro deficit. PSYCH: Calm. Normal affect.   Procedures:  EGD on 2/15 revealed normal esophagus, a large infiltrative mass with no bleeding was found in the posterior wall of the stomach.   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  Pathology confirmed gastric stromal tumor. -General surgery recommends neoadjuvant chemotherapy prior to resection -Oncology consulted.  Liver lesion: Concern about metastasis from gastric tumor. -Per oncology.  Gastritis/H. pylori infection -Continue PPI -H. 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 -Hgb 6.2 (admit)>2u>> 8.7  Tobacco use disorder -Encouraged cessation -Continue nicotine patch  Unintentional weight loss -Appreciate dietitian input-supplements and multivitamins     Nutrition Problem: Inadequate oral intake Etiology: altered GI function(large necrotic stomach mass concerning for gastric adenocarcinoma)  Signs/Symptoms: per patient/family report(poor appetite due to early satiety and endorses 65 lb wt loss over the past 3 months)  Interventions: Ensure Enlive (each supplement provides 350kcal and 20 grams of protein), Magic cup, MVI   DVT prophylaxis: SCD Code  Status: Full code Family Communication: Patient and/or RN. Available if any question.  Discharge barrier: Gastric stromal tumor.  Clearance by consultants Patient is from: Home Final disposition: Likely home once cleared by consultants.  Consultants: GI, general surgery, oncology   Microbiology summarized: [id number] negative. Influenza PCR negative.  Sch Meds:    Scheduled Meds: . sodium chloride   Intravenous Once  . feeding supplement (ENSURE ENLIVE)  237 mL Oral TID BM  . influenza vaccine adjuvanted  0.5 mL Intramuscular Tomorrow-1000  . multivitamin with minerals  1 tablet Oral Daily  . nicotine  14 mg Transdermal Daily  . pantoprazole  40 mg Oral BID  . pneumococcal 23 valent vaccine  0.5 mL Intramuscular Tomorrow-1000   Continuous Infusions: PRN Meds:.acetaminophen **OR** acetaminophen, ondansetron (ZOFRAN) IV  Antimicrobials: Anti-infectives (From admission, onward)   None       I have personally reviewed the following labs and images: CBC: Recent Labs  Lab 01/21/20 1839 01/22/20 1019 01/23/20 0608 01/24/20 1327  WBC 8.3 8.5 9.1 10.2  NEUTROABS 6.2  --   --   --   HGB 6.2* 8.6* 8.8* 8.7*  HCT 21.2* 27.0* 28.5* 28.2*  MCV 78.8* 80.1 81.9 82.0  PLT 420* 369 450* 445*   BMP &GFR Recent Labs  Lab 01/21/20 1839 01/22/20 1019 01/23/20 0608  NA 133* 134* 135  K 4.2 3.8 3.6  CL 101 103 101  CO2 23 20* 21*  GLUCOSE 129* 97 103*  BUN 18 13 14   CREATININE 0.90 0.74 0.83  CALCIUM 7.8* 7.4* 7.6*  MG  --  2.2  --    Estimated Creatinine Clearance: 67.6 mL/min (by C-G formula based on SCr of 0.83 mg/dL). Liver & Pancreas: Recent Labs  Lab 01/21/20 1839 01/22/20 1019 01/23/20 0608  AST 55* 52* 51*  ALT 31 29 31   ALKPHOS 103 88 98  BILITOT 0.5 1.4* 1.0  PROT 6.3* 5.6* 5.5*  ALBUMIN 1.5* 1.4* 1.2*   Recent Labs  Lab 01/21/20 1839  LIPASE 19   No results for input(s): AMMONIA in the last 168 hours. Diabetic: No results for input(s): HGBA1C in the last 72 hours. No results for input(s): GLUCAP in the last 168 hours. Cardiac Enzymes: No results for input(s): CKTOTAL, CKMB, CKMBINDEX, TROPONINI in the last 168 hours. No results for input(s): PROBNP in the last 8760 hours. Coagulation Profile: No results for input(s): INR, PROTIME in the last 168 hours. Thyroid Function Tests: No results for input(s): TSH,  T4TOTAL, FREET4, T3FREE, THYROIDAB in the last 72 hours. Lipid Profile: No results for input(s): CHOL, HDL, LDLCALC, TRIG, CHOLHDL, LDLDIRECT in the last 72 hours. Anemia Panel: No results for input(s): VITAMINB12, FOLATE, FERRITIN, TIBC, IRON, RETICCTPCT in the last 72 hours. Urine analysis:    Component Value Date/Time   COLORURINE YELLOW 01/22/2020 0418   APPEARANCEUR CLEAR 01/22/2020 0418   LABSPEC 1.040 (H) 01/22/2020 0418   PHURINE 5.0 01/22/2020 0418   GLUCOSEU NEGATIVE 01/22/2020 0418   HGBUR NEGATIVE 01/22/2020 0418   BILIRUBINUR NEGATIVE 01/22/2020 0418   KETONESUR NEGATIVE 01/22/2020 0418   PROTEINUR NEGATIVE 01/22/2020 0418   NITRITE NEGATIVE 01/22/2020 0418   LEUKOCYTESUR NEGATIVE 01/22/2020 0418   Sepsis Labs: Invalid input(s): PROCALCITONIN, [place]  Microbiology: Recent Results (from the past 240 hour(s))  Respiratory Panel by RT PCR (Flu A&B, Covid) - Nasopharyngeal Swab     Status: None   Collection Time: 01/21/20 11:20 PM   Specimen: Nasopharyngeal Swab  Result Value Ref Range Status   SARS  Coronavirus 2 by RT PCR NEGATIVE NEGATIVE Final    Comment: (NOTE) SARS-CoV-2 target nucleic acids are NOT DETECTED. The SARS-CoV-2 RNA is generally detectable in upper respiratoy specimens during the acute phase of infection. The lowest concentration of SARS-CoV-2 viral copies this assay can detect is 131 copies/mL. A negative result does not preclude SARS-Cov-2 infection and should not be used as the sole basis for treatment or other patient management decisions. A negative result may occur with  improper specimen collection/handling, submission of specimen other than nasopharyngeal swab, presence of viral mutation(s) within the areas targeted by this assay, and inadequate number of viral copies (<131 copies/mL). A negative result must be combined with clinical observations, patient history, and epidemiological information. The expected result is Negative. Fact  Sheet for Patients:  PinkCheek.be Fact Sheet for Healthcare Providers:  GravelBags.it This test is not yet ap proved or cleared by the [place] FDA and  has been authorized for detection and/or diagnosis of SARS-CoV-2 by FDA under an Emergency Use Authorization (EUA). This EUA will remain  in effect (meaning this test can be used) for the duration of the COVID-19 declaration under Section 564(b)(1) of the Act, 21 U.S.C. section 360bbb-3(b)(1), unless the authorization is terminated or revoked sooner.    Influenza A by PCR NEGATIVE NEGATIVE Final   Influenza B by PCR NEGATIVE NEGATIVE Final    Comment: (NOTE) The Xpert Xpress SARS-CoV-2/FLU/RSV assay is intended as an aid in  the diagnosis of influenza from Nasopharyngeal swab specimens and  should not be used as a sole basis for treatment. Nasal washings and  aspirates are unacceptable for Xpert Xpress SARS-CoV-2/FLU/RSV  testing. Fact Sheet for Patients: PinkCheek.be Fact Sheet for Healthcare Providers: GravelBags.it This test is not yet approved or cleared by the [place] FDA and  has been authorized for detection and/or diagnosis of SARS-CoV-2 by  FDA under an Emergency Use Authorization (EUA). This EUA will remain  in effect (meaning this test can be used) for the duration of the  Covid-19 declaration under Section 564(b)(1) of the Act, 21  U.S.C. section 360bbb-3(b)(1), unless the authorization is  terminated or revoked. Performed at [facility], [place] [address], [place], [place] [address]     Radiology Studies: No results found.   Treylen Gibbs T. [place]  If 7PM-7AM, please contact night-coverage [email or web address] Password [facility] 01/26/2020, 2:01 PM

## 2020-01-26 NOTE — Progress Notes (Signed)
Mews score 2 due to fever of 101.7. This change is not acute - he was similarly febrile for 1200 vitals with a fever of 101.1. Provided PRN tylenol. Provider notified. Will update if fever is not relieved or for any new orders.

## 2020-01-26 NOTE — Progress Notes (Signed)
CCMD called to verify that the patient is non-tele. Per order history, cardiac monitoring was discontinued at 01/23/20 0841 by Dr. Eliseo Squires DO and has not been reordered since.

## 2020-01-26 NOTE — Progress Notes (Signed)
Patient's daughter called requesting update on patient. Daughter was updated on plan of care. Daughter states that patient's wife will be available to pick him up form the hospital this afternoon, RN educated patient's daughter that there are no discharge orders currently in. All questions and concerns were answered.

## 2020-01-26 NOTE — Consult Note (Addendum)
[facility] Surgery Consult Note  Kevin [name] 1951/01/01  [id number].    Requesting MD: Anson Fret Chief Complaint: Unable to eat with a loss of appetite Reason for Consult: Gastric stromal tumor/H. pylori positive   HPI:  Patient is a 69 year old Hispanic male who presents on 01/21/2020, with the above noted complaints.  He also reported a 65 pound weight loss.  He reports his stool was black. Patient reports he is only had the black stools for about a week.   On admission his temperature was 100.2.  Vital signs were stable. Sodium was 133 other electrolytes were normal.  Glucose 129, albumin 1.5, AST 55, total protein 6.3.  WBC 8.3, hemoglobin 6.2, hematocrit 21.2, platelets 420,000.  Respiratory panel was negative.  Urinalysis was unremarkable.  CT of the abdomen shows a large necrotic mass centered in the posterior wall of the stomach, consistent with a gastric adenocarcinoma.  There is irregular hypodense mass in the right liver lobe worrisome for metastatic disease and an enlarged prostate.  He was seen in consult by Dr. Anson Fret, [facility].  He underwent EGD on 01/24/2020.  This showed a normal esophagus, a large infiltrative mass with no bleeding was found in the posterior wall of the stomach.  This corresponded to the area on the CT.  Biopsies were obtained.  He was placed on a clear liquid diet.  Progress notes today show his biopsy came back as a stromal tumor.  He is also positive for H. pylori.  He has been placed on medical management for his H. pylori.  We are asked to see about resecting his gastric tumor.  ROS: Review of Systems  Constitutional: Positive for weight loss.  All other systems reviewed and are negative.   Family History  Family history unknown: Yes    History reviewed. No pertinent past medical history.  Past Surgical History:  Procedure Laterality Date  . BIOPSY  01/24/2020   Procedure: BIOPSY;  Surgeon: Wonda Horner, MD;  Location: [place];   Service: Endoscopy;;  . ESOPHAGOGASTRODUODENOSCOPY (EGD) WITH PROPOFOL N/A 01/24/2020   Procedure: ESOPHAGOGASTRODUODENOSCOPY (EGD) WITH PROPOFOL;  Surgeon: Wonda Horner, MD;  Location: [facility] ENDOSCOPY;  Service: Endoscopy;  Laterality: N/A;  . SCLEROTHERAPY  01/24/2020   Procedure: SCLEROTHERAPY;  Surgeon: Wonda Horner, MD;  Location: [facility] ENDOSCOPY;  Service: Endoscopy;;    Social History:  reports that he has been smoking cigarettes. He has never used smokeless tobacco. He reports previous alcohol use. No history on file for drug. Patient still works He lives with his wife and children Positive history of tobacco abuse  Allergies: No Known Allergies  No medications prior to admission.    Blood pressure 105/63, pulse 86, temperature 100.1 F (37.8 C), temperature source Oral, resp. rate 17, height _0  (1.676 m), weight 55.3 kg, SpO2 94 %. Physical Exam:  General: pleasant, think cachectic Hispanic male who is laying in bed in NAD HEENT: head is normocephalic, atraumatic.  Sclera are noninjected.  Pupils are equal; Mouth is pink and moist Heart: regular, rate, and rhythm.  No obvious murmurs, gallops, or rubs noted.  Palpable radial and pedal pulses bilaterally Lungs: CTAB, no wheezes, rhonchi, or rales noted.  Respiratory effort nonlabored Abd: soft, NT, ND, +BS, no masses, hernias, or organomegaly MS: all 4 extremities are symmetrical with no cyanosis, clubbing, or edema. Skin: warm and dry with no masses, lesions, or rashes Neuro: Cranial nerves 2-12 grossly intact, speech is normal Psych: A&Ox3 with an appropriate  affect.  Patient seen and interviewed jointly with Dr. Rosendo Gros.  Interview was conducted in Spanish by Dr. Rosendo Gros.  Results for orders placed or performed during the hospital encounter of 01/21/20 (from the past 48 hour(s))  Surgical pathology     Status: None   Collection Time: 01/24/20 11:43 AM  Result Value Ref Range   SURGICAL PATHOLOGY      SURGICAL  PATHOLOGY CASE: [id number] PATIENT: Kevin [name] Surgical Pathology Report     Clinical History: Gastric mass (nt)     FINAL MICROSCOPIC DIAGNOSIS:  A. STOMACH MASS, BIOPSY: - Gastrointestinal stromal tumor. - Chronic active gastritis with Helicobacter pylori. - Immunohistochemistry is positive for Helicobacter pylori.  COMMENT:  There is a focal submucosal spindle cell proliferation. By immunohistochemistry the cells are positive for CD117 and CD34. SMA, desmin, and S100 are negative.  GROSS DESCRIPTION:  Received in formalin are tan, soft tissue fragments that are submitted in toto. Number: 3 size: 0.25 to 0.5 cm.  Blocks: 1  SW 01/24/2020    Final Diagnosis performed by Vicente Males, MD.   Electronically signed 01/26/2020 Technical component performed at [facility]. [facility], [place] [address] [place], [place] [address].  Professional component performed at [facility], [place] [address], [place], [place] [address].  Immunohistochemistry Technical component (if applicable) was performed at [facility]. [address] [place], [place], [place] [address].   IMMUNOHISTOCHEMISTRY DISCLAIMER (if applicable): Some of these immunohistochemical stains may have been developed and the performance characteristics determine by [facility]. Some may not have been cleared or approved by the U.S. Food and Drug Administration. The FDA has determined that such clearance or approval is not necessary. This test is used for clinical purposes. It should not be regarded as investigational or for research. This laboratory is certified under the [place] (CLIA-88) as qualified to perform high complexity clinical laboratory testing.  The controls stained appropriately.   CBC     Status: Abnormal   Collection Time: 01/24/20  1:27 PM  Result Value Ref Range   WBC 10.2 4.0 - 10.5 K/uL    RBC 3.44 (L) 4.22 - 5.81 MIL/uL   Hemoglobin 8.7 (L) 13.0 - 17.0 g/dL   HCT 28.2 (L) 39.0 - 52.0 %   MCV 82.0 80.0 - 100.0 fL   MCH 25.3 (L) 26.0 - 34.0 pg   MCHC 30.9 30.0 - 36.0 g/dL   RDW 17.2 (H) 11.5 - 15.5 %   Platelets 445 (H) 150 - 400 K/uL   nRBC 0.0 0.0 - 0.2 %    Comment: Performed at [place] [address], [place],  [address]   No results found.     Assessment/Plan Hx tobacco use Anemia: Transfused 2 units 01/21/2020  -H/H: 6.2/21.2>> 8.6/27>> 8.8/28.5  Gastric stromal tumor H pylori positive  FEN: No IV fluids/regular diet ID: None DVT: SCDs Follow up: TBD  Plan: Dr. Rosendo Gros has reviewed the CT scan and the studies with Dr. Penelope Coop and Dr. Barry Dienes.  It is Dr. Marlowe Aschoff recommendation that we get an oncology consult and plan neoadjuvant therapy as an initial treatment. This tumor is so large it would most likely require a total gastrectomy.  Sometimes these will bleed which might require more urgent surgical intervention. We will continue to follow with you.   I spoke with Dr. Benay Spice and he, or a colleague will see the patient tomorrow.    Earnstine Regal, PA-C [facility] 01/26/2020, 11:13 AM Please see Amion for pager number during day hours 7:00am-4:30pm

## 2020-01-26 NOTE — Progress Notes (Signed)
The patient is doing well today and has no complaints.  His stomach tumor biopsy came back showing a stromal tumor.  I told him this.  I will ask general surgery to see him to consider resection.  He also has H. pylori on the biopsy.  This will need to be treated with either Prevpac for 14 days or Pylera for 10 days.  We will await surgical consult.

## 2020-01-27 ENCOUNTER — Other Ambulatory Visit: Payer: Self-pay | Admitting: Oncology

## 2020-01-27 ENCOUNTER — Inpatient Hospital Stay (HOSPITAL_COMMUNITY): Payer: Self-pay

## 2020-01-27 ENCOUNTER — Encounter (HOSPITAL_COMMUNITY): Payer: Self-pay | Admitting: Internal Medicine

## 2020-01-27 ENCOUNTER — Telehealth: Payer: Self-pay | Admitting: Pharmacist

## 2020-01-27 DIAGNOSIS — R509 Fever, unspecified: Secondary | ICD-10-CM

## 2020-01-27 DIAGNOSIS — K3189 Other diseases of stomach and duodenum: Secondary | ICD-10-CM

## 2020-01-27 LAB — HEMOGLOBIN AND HEMATOCRIT, BLOOD
HCT: 26 % — ABNORMAL LOW (ref 39.0–52.0)
Hemoglobin: 8.1 g/dL — ABNORMAL LOW (ref 13.0–17.0)

## 2020-01-27 MED ORDER — IMATINIB MESYLATE 400 MG PO TABS: 400.0000 mg | ORAL_TABLET | Freq: Every day | ORAL | 0 refills | Status: DC

## 2020-01-27 MED ORDER — GADOBUTROL 1 MMOL/ML IV SOLN
5.0000 mL | Freq: Once | INTRAVENOUS | Status: AC | PRN
  Administered 2020-01-27: 5 mL via INTRAVENOUS

## 2020-01-27 MED ORDER — IMATINIB MESYLATE 100 MG PO TABS
400.0000 mg | ORAL_TABLET | Freq: Every day | ORAL | Status: DC
  Administered 2020-01-28 – 2020-02-03 (×7): 400 mg via ORAL
  Filled 2020-01-27 (×9): qty 4

## 2020-01-27 MED ORDER — FERROUS SULFATE 325 (65 FE) MG PO TABS
325.0000 mg | ORAL_TABLET | Freq: Two times a day (BID) | ORAL | Status: DC
  Administered 2020-01-27 – 2020-02-03 (×15): 325 mg via ORAL
  Filled 2020-01-27 (×15): qty 1

## 2020-01-27 MED ORDER — SODIUM CHLORIDE 0.9 % IV SOLN
510.0000 mg | Freq: Once | INTRAVENOUS | Status: AC
  Administered 2020-01-27: 510 mg via INTRAVENOUS
  Filled 2020-01-27: qty 17

## 2020-01-27 NOTE — Progress Notes (Signed)
Patient refused AM labs

## 2020-01-27 NOTE — Progress Notes (Signed)
Appreciate surgical input.  Awaiting oncology input.  From a GI standpoint nothing further to suggest.  We will sign off.  Call us if needed.

## 2020-01-27 NOTE — Progress Notes (Signed)
RN spoke with pt's wife on the phone Wife stated that wants to speak with MD regarding biopsy results. MD notified per RN

## 2020-01-27 NOTE — Telephone Encounter (Signed)
Oral Oncology Pharmacist Encounter  Received new prescription for Gleevec (imatinib) for the neoadjuvant treatment of newly diagnosed GIST, planned duration until able to proceed with surgery.  CMP from 01/23/20 assessed, no relevant lab abnormalities. Prescription dose and frequency assessed.   Current medication list in Epic reviewed, no relevant DDIs with imatinib identified.  Prescription has been e-scribed to the [facility] for benefits analysis and approval.  Oral Oncology Clinic will continue to follow for insurance authorization, copayment issues, initial counseling and start date.  Darl Pikes, PharmD, BCPS, [facility] Hematology/Oncology Clinical Pharmacist [facility]/AP Oral [facility] [phone]  01/27/2020 3:40 PM

## 2020-01-27 NOTE — Consult Note (Addendum)
[facility]  [phone] [phone]   [place]  Referral MD: Dr. 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 Consult was completed with the use of video interpreter services. Much of the history is taken from his chart. The patient presented to the emergency room to request for someone to evaluate his abdomen. He had reported anorexia and weight loss of about 65 lbs as well as black stools. In the ER, he was found to have a low-grade fever of 100.2. He had a CT of the abdomen pelvis with contrast which showed a large necrotic mass centered in the posterior wall of the stomach consistent with gastric adenocarcinoma and an irregular hypodense mass in the right lobe of the liver worrisome for metastatic disease, as well as an enlarged prostate. The patient was seen by both gastroenterology and general surgery. 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 The patient was also positive for H. pylori. General surgery recommended neoadjuvant treatment with Gleevec prior to consideration of resection.  Today, the patient reports that he is not having any issues. He reported anorexia and "a lot" of weight loss prior to admission. Reports that his normal weight was approximately 200 lbs. He has been having intermittent fevers but no chills. He is not having any headaches or dizziness. Denies chest discomfort and shortness of breath. He denies abdominal pain, nausea, vomiting, constipation, diarrhea. He has not noted any recurrent black stools. The patient is married and has 2 children and 3 grandchildren. He denies alcohol use. States that he does not currently smoke cigarettes despite what is documented on his chart that he does  actively smoke. States that he only smoked cigarettes for about 2 to 3 months many years ago. He denies family history of cancer or blood disorders. Medical oncology was asked see the patient to make recommendations regarding his newly diagnosed GIST.   History reviewed. No pertinent past medical history.:  Past Surgical History:  Procedure Laterality Date  . BIOPSY  01/24/2020   Procedure: BIOPSY;  Surgeon: Wonda Horner, MD;  Location: [facility];  Service: Endoscopy;;  . ESOPHAGOGASTRODUODENOSCOPY (EGD) WITH PROPOFOL N/A 01/24/2020   Procedure: ESOPHAGOGASTRODUODENOSCOPY (EGD) WITH PROPOFOL;  Surgeon: Wonda Horner, MD;  Location: [facility] ENDOSCOPY;  Service: Endoscopy;  Laterality: N/A;  . SCLEROTHERAPY  01/24/2020   Procedure: SCLEROTHERAPY;  Surgeon: Wonda Horner, MD;  [facility] END[facility];  Service: Endoscopy;;  :  Current Facility-Administered Medications  Medication Dose Route Frequency Provider Last Rate Last Admin  . 0.9 %  sodium chloride infusion (Manually program via Guardrails IV Fluids)   Intravenous Once Blanchie Dessert, MD      . acetaminophen (TYLENOL) tablet 650 mg  650 mg Oral Q6H PRN Jani Gravel, MD   650 mg at 01/26/20 2109   Or  . acetaminophen (TYLENOL) suppository 650 mg  650 mg Rectal Q6H PRN Jani Gravel, MD      . feeding supplement (ENSURE ENLIVE) (ENSURE ENLIVE) liquid 237 mL  237 mL Oral TID BM Vann, Jessica U, DO   237 mL at 01/26/20 2109  . influenza vaccine adjuvanted (FLUAD) injection 0.5 mL  0.5 mL Intramuscular Tomorrow-1000 Vann, Jessica U, DO      . multivitamin with minerals tablet 1 tablet  1 tablet Oral Daily Eulogio Bear U, DO   1 tablet at 01/27/20 1055  . nicotine (NICODERM CQ - dosed in mg/24 hours) patch 14 mg  14 mg Transdermal Daily Vann, Jessica U, DO      . ondansetron (ZOFRAN) injection 4 mg  4 mg Intravenous Q6H PRN Jani Gravel, MD      . pantoprazole (PROTONIX) EC tablet 40 mg  40 mg Oral BID Kris Mouton, RPH   40 mg at 01/27/20 1055   . pneumococcal 23 valent vaccine (PNEUMOVAX-23) injection 0.5 mL  0.5 mL Intramuscular Tomorrow-1000 Vann, Jessica U, DO         No Known Allergies:  Family History  Family history unknown: Yes  :  Social History   Socioeconomic History  . Marital status: Married    Spouse name: Not on file  . Number of children: 3  . Years of education: Not on file  . Highest education level: Not on file  Occupational History  . Not on file  Tobacco Use  . Smoking status: Current Every Day Smoker    Types: Cigarettes  . Smokeless tobacco: Never Used  . Tobacco comment: 01/27/2020 - Pt denied smoking cigarettes  Substance and Sexual Activity  . Alcohol use: Not Currently  . Drug use: Not on file  . Sexual activity: Not on file  Other Topics Concern  . Not on file  Social History Narrative  . Not on file   Social Determinants of Health   Financial Resource Strain:   . Difficulty of Paying Living Expenses: Not on file  Food Insecurity:   . Worried About Charity fundraiser in the Last Year: Not on file  . Ran Out of Food in the Last Year: Not on file  Transportation Needs:   . Lack of Transportation (Medical): Not on file  . Lack of Transportation (Non-Medical): Not on file  Physical Activity:   . Days of Exercise per Week: Not on file  . Minutes of Exercise per Session: Not on file  Stress:   . Feeling of Stress : Not on file  Social Connections:   . Frequency of Communication with Friends and Family: Not on file  . Frequency of Social Gatherings with Friends and Family: Not on file  . Attends Religious Services: Not on file  . Active Member of Clubs or Organizations: Not on file  . Attends Archivist Meetings: Not on file  . Marital Status: Not on file  Intimate Partner Violence:   . Fear of Current or Ex-Partner: Not on file  . Emotionally Abused: Not on file  . Physically Abused: Not on file  . Sexually Abused: Not on file  :  Review of Systems: A  comprehensive 14 point review of systems was negative except as noted in the HPI.  Exam: Patient Vitals for the past 24 hrs:  BP Temp Temp src Pulse Resp SpO2 Weight  01/27/20 1137 103/66 98.4 F (36.9 C) Oral 92 18 97 % --  01/27/20 0428 111/65 98.3 F (36.8 C) Oral 91 20 96 % 118 lb 13.3 oz (53.9 kg)  01/26/20 2309 99/61 99 F (37.2 C) Oral 87 20 96 % --  01/26/20 2039 (!) 103/59 (!) 101.7 F (38.7 C) Oral 99 19 95 % --  01/26/20 1445 -- 99.1 F (37.3 C) Oral -- -- -- --    General:  well-nourished in no acute distress.   Eyes:  no scleral icterus.   ENT:  There were no oropharyngeal lesions.   Lymphatics:  Negative cervical, supraclavicular or axillary adenopathy.   Respiratory: lungs were clear bilaterally without wheezing or crackles.   Cardiovascular:  Regular rate and rhythm, S1/S2, without murmur, rub or gallop. There was no pedal edema.   GI: Positive bowel sounds, palpable mass in the left upper quadrant of the abdomen Musculoskeletal:  no spinal tenderness of palpation of vertebral spine.   Skin exam was without echymosis, petichae.   Neuro exam was nonfocal. Patient was alert and oriented.  Attention was good.   Language was appropriate.  Mood was normal without depression.  Speech was not pressured.  Thought content was not tangential.     Lab Results  Component Value Date   WBC 10.2 01/24/2020   HGB 8.1 (L) 01/27/2020   HCT 26.0 (L) 01/27/2020   PLT 445 (H) 01/24/2020   GLUCOSE 103 (H) 01/23/2020   ALT 31 01/23/2020   AST 51 (H) 01/23/2020   NA 135 01/23/2020   K 3.6 01/23/2020   CL 101 01/23/2020   CREATININE 0.83 01/23/2020   BUN 14 01/23/2020   CO2 21 (L) 01/23/2020    DG Chest 2 View  Result Date: 01/27/2020 CLINICAL DATA:  Fever EXAM: CHEST - 2 VIEW COMPARISON:  01/22/2020 FINDINGS: The heart size and mediastinal contours are within normal limits. Both lungs are clear. Disc degenerative disease of the thoracic spine. IMPRESSION: No acute abnormality  of the lungs. Electronically Signed   By: Eddie Candle M.D.   On: 01/27/2020 13:04   CT ABDOMEN PELVIS W CONTRAST  Result Date: 01/21/2020 CLINICAL DATA:  Loss of appetite EXAM: CT ABDOMEN AND PELVIS WITH CONTRAST TECHNIQUE: Multidetector CT imaging of the abdomen and pelvis was performed using the standard protocol following bolus administration of intravenous contrast. CONTRAST:  131m OMNIPAQUE IOHEXOL 300 MG/ML  SOLN COMPARISON:  None. FINDINGS: Lower chest: No acute pleural or parenchymal lung disease. Hepatobiliary: Hypodense mass right lobe liver measures up to 5.2 x 6.9 cm coarsened for metastatic disease. Gallbladder is unremarkable. Pancreas: Unremarkable. No pancreatic ductal dilatation or surrounding inflammatory changes. Spleen: Normal in size without focal abnormality. Adrenals/Urinary Tract: Left renal cyst. Areas of right renal cortical scarring are noted. No obstructive uropathy. The adrenals are normal. Bladder is unremarkable. Stomach/Bowel: There is a large necrotic mass centered in the gastric body and antrum, measuring 16.5 by 10 cm in transverse dimension, and extending approximately 17.3 cm in craniocaudal length. Findings are consistent with gastric adenocarcinoma. This mass results and mass effect upon the upper abdominal viscera but I do not see any direct invasion. No bowel obstruction.  Normal appendix. Vascular/Lymphatic: Vascular structures are patent. No critical stenosis. I do not see any discrete adenopathy within the abdomen or pelvis. Reproductive: The prostate is enlarged measuring 5.3 x 5.3 cm in transverse dimension and extending approximately 6.8 cm in craniocaudal length. There is mass effect upon the posterior wall the bladder. Other: No free fluid.  No free intraperitoneal gas. Musculoskeletal: There are no acute or destructive bony lesions. Reconstructed images demonstrate no additional findings. IMPRESSION: 1. Large necrotic mass centered in the posterior wall of the  stomach, consistent with gastric adenocarcinoma. Endoscopy is recommended for further evaluation. 2. Irregular hypodense mass right lobe liver, worrisome for metastatic disease. 3. Enlarged prostate. Electronically Signed   By: MRanda NgoM.D.   On: 01/21/2020 21:56   DG CHEST PORT 1 VIEW  Result Date: 01/22/2020 CLINICAL DATA:  Unable to eat, loss of appetite, no  abdominal pain EXAM: PORTABLE CHEST 1 VIEW COMPARISON:  None. FINDINGS: Mixed streaky and patchy opacities are present in lung bases more focally in the retrocardiac/left lung base. No pneumothorax or effusion. The cardiomediastinal contours are unremarkable. No acute osseous or soft tissue abnormality. Degenerative changes are present in the imaged spine and shoulders. Telemetry leads overlie the chest. IMPRESSION: Mixed streaky and patchy opacities in the bases favored to reflect atelectasis though early infection could have a similar appearance. Electronically Signed   By: Lovena Le M.D.   On: 01/22/2020 00:18     DG Chest 2 View  Result Date: 01/27/2020 CLINICAL DATA:  Fever EXAM: CHEST - 2 VIEW COMPARISON:  01/22/2020 FINDINGS: The heart size and mediastinal contours are within normal limits. Both lungs are clear. Disc degenerative disease of the thoracic spine. IMPRESSION: No acute abnormality of the lungs. Electronically Signed   By: Eddie Candle M.D.   On: 01/27/2020 13:04   CT ABDOMEN PELVIS W CONTRAST  Result Date: 01/21/2020 CLINICAL DATA:  Loss of appetite EXAM: CT ABDOMEN AND PELVIS WITH CONTRAST TECHNIQUE: Multidetector CT imaging of the abdomen and pelvis was performed using the standard protocol following bolus administration of intravenous contrast. CONTRAST:  175m OMNIPAQUE IOHEXOL 300 MG/ML  SOLN COMPARISON:  None. FINDINGS: Lower chest: No acute pleural or parenchymal lung disease. Hepatobiliary: Hypodense mass right lobe liver measures up to 5.2 x 6.9 cm coarsened for metastatic disease. Gallbladder is  unremarkable. Pancreas: Unremarkable. No pancreatic ductal dilatation or surrounding inflammatory changes. Spleen: Normal in size without focal abnormality. Adrenals/Urinary Tract: Left renal cyst. Areas of right renal cortical scarring are noted. No obstructive uropathy. The adrenals are normal. Bladder is unremarkable. Stomach/Bowel: There is a large necrotic mass centered in the gastric body and antrum, measuring 16.5 by 10 cm in transverse dimension, and extending approximately 17.3 cm in craniocaudal length. Findings are consistent with gastric adenocarcinoma. This mass results and mass effect upon the upper abdominal viscera but I do not see any direct invasion. No bowel obstruction.  Normal appendix. Vascular/Lymphatic: Vascular structures are patent. No critical stenosis. I do not see any discrete adenopathy within the abdomen or pelvis. Reproductive: The prostate is enlarged measuring 5.3 x 5.3 cm in transverse dimension and extending approximately 6.8 cm in craniocaudal length. There is mass effect upon the posterior wall the bladder. Other: No free fluid.  No free intraperitoneal gas. Musculoskeletal: There are no acute or destructive bony lesions. Reconstructed images demonstrate no additional findings. IMPRESSION: 1. Large necrotic mass centered in the posterior wall of the stomach, consistent with gastric adenocarcinoma. Endoscopy is recommended for further evaluation. 2. Irregular hypodense mass right lobe liver, worrisome for metastatic disease. 3. Enlarged prostate. Electronically Signed   By: MRanda NgoM.D.   On: 01/21/2020 21:56   DG CHEST PORT 1 VIEW  Result Date: 01/22/2020 CLINICAL DATA:  Unable to eat, loss of appetite, no abdominal pain EXAM: PORTABLE CHEST 1 VIEW COMPARISON:  None. FINDINGS: Mixed streaky and patchy opacities are present in lung bases more focally in the retrocardiac/left lung base. No pneumothorax or effusion. The cardiomediastinal contours are unremarkable. No  acute osseous or soft tissue abnormality. Degenerative changes are present in the imaged spine and shoulders. Telemetry leads overlie the chest. IMPRESSION: Mixed streaky and patchy opacities in the bases favored to reflect atelectasis though early infection could have a similar appearance. Electronically Signed   By: PLovena LeM.D.   On: 01/22/2020 00:18    Pathology:  SURGICAL PATHOLOGY  CASE: [id number]  PATIENT: Kevin [name]  Surgical Pathology Report   Clinical History: Gastric mass (nt)   FINAL MICROSCOPIC DIAGNOSIS:   A. STOMACH MASS, BIOPSY:  - Gastrointestinal stromal tumor.  - Chronic active gastritis with Helicobacter pylori.  - Immunohistochemistry is positive for Helicobacter pylori.   COMMENT:   There is a focal submucosal spindle cell proliferation. By  immunohistochemistry the cells are positive for CD117 and CD34. SMA,  desmin, and S100 are negative.   Assessment and Plan:  1. GIST of the stomach 2. Liver lesion in the right lobe - ? Metastasis versus hemangioma 3. Iron deficiency anemia 4. Gastritis and H. pylori infection 5. Fever of unclear etiology  6. Protein calorie malnutrition  Kevin [name] was found to have a large abdominal mass which was biopsied and is consistent with GIST. He has been evaluated by general surgery and this is not resectable. They have recommended neoadjuvant Gleevec followed by possible resection. He also has a liver mass which could represent metastasis versus hemangioma. The patient received 2 units PRBCs for his iron deficiency anemia and received IV Feraheme on 01/27/2020.  Recommendations: 1. The diagnosis was discussed with the patient today through use of an interpreter. The patient indicated that he had not been told of his diagnosis previously. We discussed that he is not a surgical candidate at this time but we can consider treatment with Gleevec to try to shrink the tumor for possible resection at a later date. 2.  Recommend MRI of the liver with and without contrast better characterize the liver lesion. 3. Hemoglobin currently stable. He does not report any active bleeding. Recommend close monitoring of CBC and transfuse to keep hemoglobin above 7.  Begin oral iron therapy 4.  Continue Protonix and treat H. pylori as recommended by Dr. Penelope Coop  Thank you for this referral.   Mikey Bussing, DNP, AGPCNP-BC, AOCNP  Kevin [name] was interviewed and examined.  I reviewed the CT images.  I discussed the diagnosis of a gastrointestinal stromal tumor with Kevin [name].  There is an isolated liver lesion that could represent a metastasis.  There was a very large gastric tumor and that would require a total gastrectomy for resection.  General surgery has requested neoadjuvant imatinib in an attempt to shrink the tumor and allow for a partial gastrectomy.  I agree with this approach.  I discussed the plan with Kevin [name] with the aid of a Spanish interpreter.  I discussed the case with his wife by telephone.  The plan is to begin [place] while in the hospital and continue as an outpatient.  I contacted the cancer center pharmacy to begin the process of obtaining outpatient [place].  We will order a liver MRI to better characterize the liver lesion.  We will recommend a biopsy if the lesion remains suspicious for a metastasis.  Outpatient follow-up will be scheduled at the Cancer center.

## 2020-01-27 NOTE — Progress Notes (Signed)
3 Days Post-Op    BX:[id number] to eat with a loss of appetite  Subjective: Interpretor:[id number] Pt not really complaining, not having pain, not eating a lot.  She has not seen Dr. Benay Spice yet.    Objective: Vital signs in last 24 hours: Temp:  [98.3 F (36.8 C)-101.7 F (38.7 C)] 98.3 F (36.8 C) (02/18 0428) Pulse Rate:  [86-99] 91 (02/18 0428) Resp:  [17-20] 20 (02/18 0428) BP: (99-117)/(59-66) 111/65 (02/18 0428) SpO2:  [94 %-96 %] 96 % (02/18 0428) Weight:  [53.9 kg] 53.9 kg (02/18 0428) Last BM Date: 01/25/20 360 PO Urine 1100 No Bm Recorded TM 101.7; BP and HR stable  Intake/Output from previous day: 02/17 0701 - 02/18 0700 In: 360 [P.O.:360] Out: 1100 [Urine:1100] Intake/Output this shift: No intake/output data recorded.  General appearance: alert, cooperative and no distress Resp: clear to auscultation bilaterally GI: soft, non-tender; bowel sounds normal; no masses,  no organomegaly  Lab Results:  Recent Labs    01/24/20 1327  WBC 10.2  HGB 8.7*  HCT 28.2*  PLT 445*    BMET No results for input(s): NA, K, CL, CO2, GLUCOSE, BUN, CREATININE, CALCIUM in the last 72 hours. PT/INR No results for input(s): LABPROT, INR in the last 72 hours.  Recent Labs  Lab 01/21/20 1839 01/22/20 1019 01/23/20 0608  AST 55* 52* 51*  ALT 31 29 31   ALKPHOS 103 88 98  BILITOT 0.5 1.4* 1.0  PROT 6.3* 5.6* 5.5*  ALBUMIN 1.5* 1.4* 1.2*     Lipase     Component Value Date/Time   LIPASE 19 01/21/2020 1839     Medications: . sodium chloride   Intravenous Once  . feeding supplement (ENSURE ENLIVE)  237 mL Oral TID BM  . influenza vaccine adjuvanted  0.5 mL Intramuscular Tomorrow-1000  . multivitamin with minerals  1 tablet Oral Daily  . nicotine  14 mg Transdermal Daily  . pantoprazole  40 mg Oral BID  . pneumococcal 23 valent vaccine  0.5 mL Intramuscular Tomorrow-1000    Assessment/Plan Hx tobacco use Anemia: Transfused 2 units 01/21/2020  -H/H: 6.2/21.2>>  8.6/27>> 8.8/28.5  Gastric stromal tumor H pylori positive  FEN: No IV fluids/regular diet ID: None DVT: SCDs Follow up: TBD  Plan:  Oncology consult/chemotherapy and then follow up with possible surgical resection.  We will be following in the hospital if he develops bleeding.      LOS: 5 days    Kevin [name] 01/27/2020 Please see Amion

## 2020-01-27 NOTE — Progress Notes (Signed)
PROGRESS NOTE  Kevin [name] [id number] DOB: 29-Apr-1951   PCP: Patient, No Pcp Per   Patient is from: home  DOA: 01/21/2020 LOS: 5  Brief Narrative / Interim history: 69 year old Spanish male with history of tobacco use disorder presented to ED 2/12 with difficulty eating and unintentional weight loss of about 65 pounds.  Also had melanotic stool.  In ED, CT abdomen and pelvis revealed large necrotic mass in the posterior wall of the stomach and irregular hypodense mass in the right lobe of the liver concerning for metastatic disease.  Hgb 6.2 with normal MCV.   Patient received 2 units of packed red blood cells with appropriate response.  Started on PPI.  GI consulted, and he had EGD and biopsy of the mass on 2/15.  Pathology consistent with gastric stromal tumor.  Also positive for H. pylori infection.  General surgery consulted and recommended neoadjuvant Gleevec prior to resection.  Oncology consulted  Subjective: Spiked fever to 101.7 yesterday.  Personally, he did not feel fever.  He denies chest pain, shortness of breath, new cough, GI or UTI symptoms.  Denies headache or neck stiffness.  Video interpreter with ID number [id number] used for this encounter. Objective: Vitals:   01/26/20 2039 01/26/20 2309 01/27/20 0428 01/27/20 1137  BP: (!) 103/59 99/61 111/65 103/66  Pulse: 99 87 91 92  Resp: 19 20 20 18   Temp: (!) 101.7 F (38.7 C) 99 F (37.2 C) 98.3 F (36.8 C) 98.4 F (36.9 C)  TempSrc: Oral Oral Oral Oral  SpO2: 95% 96% 96% 97%  Weight:   53.9 kg   Height:        Intake/Output Summary (Last 24 hours) at 01/27/2020 1159 Last data filed at 01/27/2020 0900 Gross per 24 hour  Intake 460 ml  Output 1100 ml  Net -640 ml   Filed Weights   01/25/20 0420 01/26/20 0421 01/27/20 0428  Weight: 56.1 kg 55.3 kg 53.9 kg    Examination:  GENERAL: No acute distress.  Appears well.  HEENT: MMM.  Vision and hearing grossly intact.  NECK: Supple.  No apparent JVD.  RESP:   No IWOB. Good air movement bilaterally. CVS:  RRR. Heart sounds normal.  ABD/GI/GU: Bowel sounds present. Soft. Non tender.  MSK/EXT:  Moves extremities. No apparent deformity. No edema.  SKIN: no apparent skin lesion or wound NEURO: Awake, alert and oriented appropriately.  No apparent focal neuro deficit. PSYCH: Calm. Normal affect.  Procedures:  EGD on 2/15 revealed normal esophagus, a large infiltrative mass with no bleeding was found in the posterior wall of the stomach.   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  Pathology confirmed gastric stromal tumor. -General surgery recommends neoadjuvant chemotherapy prior to resection -Oncology consulted-we will follow recommendations  Liver lesion: Concern about metastasis from gastric tumor. -Per oncology-consulted.  Gastritis/H. pylori infection -Continue PPI -H. pylori treatment per GI  Iron deficiency anemia due to chronic blood loss likely from GI tumor.  Iron sat 7%. -Hgb 6.2 (admit)>2u>> 8.7> 8.1 -IV Feraheme.   Tobacco use disorder -Encouraged cessation -Continue nicotine patch  Fever: unclear etiology. No clinical clues to suggest a specific source.  Reports intermittent cough which is chronic for him. -Obtain 2 view chest x-ray  Hyponatremia: Resolved.  Metabolic acidosis: Improved.  Unintentional weight loss -Appreciate dietitian input-supplements and multivitamins Nutrition Problem: Inadequate oral intake Etiology: altered GI function(large necrotic stomach mass concerning for gastric adenocarcinoma)  Signs/Symptoms: per patient/family report(poor appetite  due to early satiety and endorses 65 lb wt loss over the past 3 months)  Interventions: Ensure Enlive (each supplement provides 350kcal and 20 grams of protein), Magic cup, MVI   DVT prophylaxis: SCD Code Status: Full code Family Communication: Patient and/or RN. Available if any question.  Discharge  barrier: Gastric stromal tumor and new fever. Patient is from: Home Final disposition: Likely home once cleared by consultants (oncology).  Consultants: GI, general surgery, oncology   Microbiology summarized: [id number] negative. Influenza PCR negative.  Sch Meds:  Scheduled Meds: . sodium chloride   Intravenous Once  . feeding supplement (ENSURE ENLIVE)  237 mL Oral TID BM  . influenza vaccine adjuvanted  0.5 mL Intramuscular Tomorrow-1000  . multivitamin with minerals  1 tablet Oral Daily  . nicotine  14 mg Transdermal Daily  . pantoprazole  40 mg Oral BID  . pneumococcal 23 valent vaccine  0.5 mL Intramuscular Tomorrow-1000   Continuous Infusions: PRN Meds:.acetaminophen **OR** acetaminophen, ondansetron (ZOFRAN) IV  Antimicrobials: Anti-infectives (From admission, onward)   None       I have personally reviewed the following labs and images: CBC: Recent Labs  Lab 01/21/20 1839 01/22/20 1019 01/23/20 0608 01/24/20 1327 01/27/20 0755  WBC 8.3 8.5 9.1 10.2  --   NEUTROABS 6.2  --   --   --   --   HGB 6.2* 8.6* 8.8* 8.7* 8.1*  HCT 21.2* 27.0* 28.5* 28.2* 26.0*  MCV 78.8* 80.1 81.9 82.0  --   PLT 420* 369 450* 445*  --    BMP &GFR Recent Labs  Lab 01/21/20 1839 01/22/20 1019 01/23/20 0608  NA 133* 134* 135  K 4.2 3.8 3.6  CL 101 103 101  CO2 23 20* 21*  GLUCOSE 129* 97 103*  BUN 18 13 14   CREATININE 0.90 0.74 0.83  CALCIUM 7.8* 7.4* 7.6*  MG  --  2.2  --    Estimated Creatinine Clearance: 65.8 mL/min (by C-G formula based on SCr of 0.83 mg/dL). Liver & Pancreas: Recent Labs  Lab 01/21/20 1839 01/22/20 1019 01/23/20 0608  AST 55* 52* 51*  ALT 31 29 31   ALKPHOS 103 88 98  BILITOT 0.5 1.4* 1.0  PROT 6.3* 5.6* 5.5*  ALBUMIN 1.5* 1.4* 1.2*   Recent Labs  Lab 01/21/20 1839  LIPASE 19   No results for input(s): AMMONIA in the last 168 hours. Diabetic: No results for input(s): HGBA1C in the last 72 hours. No results for input(s): GLUCAP in  the last 168 hours. Cardiac Enzymes: No results for input(s): CKTOTAL, CKMB, CKMBINDEX, TROPONINI in the last 168 hours. No results for input(s): PROBNP in the last 8760 hours. Coagulation Profile: No results for input(s): INR, PROTIME in the last 168 hours. Thyroid Function Tests: No results for input(s): TSH, T4TOTAL, FREET4, T3FREE, THYROIDAB in the last 72 hours. Lipid Profile: No results for input(s): CHOL, HDL, LDLCALC, TRIG, CHOLHDL, LDLDIRECT in the last 72 hours. Anemia Panel: Recent Labs    01/26/20 1426  VITAMINB12 1,177*  FOLATE 16.1  FERRITIN 507*  TIBC 143*  IRON 10*  RETICCTPCT 1.0   Urine analysis:    Component Value Date/Time   COLORURINE YELLOW 01/22/2020 0418   APPEARANCEUR CLEAR 01/22/2020 0418   LABSPEC 1.040 (H) 01/22/2020 0418   PHURINE 5.0 01/22/2020 0418   GLUCOSEU NEGATIVE 01/22/2020 0418   HGBUR NEGATIVE 01/22/2020 0418   BILIRUBINUR NEGATIVE 01/22/2020 0418   KETONESUR NEGATIVE 01/22/2020 0418   PROTEINUR NEGATIVE 01/22/2020 0418   NITRITE NEGATIVE  01/22/2020 [place] 01/22/2020 0418   Sepsis Labs: Invalid input(s): PROCALCITONIN, [place]  Microbiology: Recent Results (from the past 240 hour(s))  Respiratory Panel by RT PCR (Flu A&B, Covid) - Nasopharyngeal Swab     Status: None   Collection Time: 01/21/20 11:20 PM   Specimen: Nasopharyngeal Swab  Result Value Ref Range Status   SARS Coronavirus 2 by RT PCR NEGATIVE NEGATIVE Final    Comment: (NOTE) SARS-CoV-2 target nucleic acids are NOT DETECTED. The SARS-CoV-2 RNA is generally detectable in upper respiratoy specimens during the acute phase of infection. The lowest concentration of SARS-CoV-2 viral copies this assay can detect is 131 copies/mL. A negative result does not preclude SARS-Cov-2 infection and should not be used as the sole basis for treatment or other patient management decisions. A negative result may occur with  improper specimen  collection/handling, submission of specimen other than nasopharyngeal swab, presence of viral mutation(s) within the areas targeted by this assay, and inadequate number of viral copies (<131 copies/mL). A negative result must be combined with clinical observations, patient history, and epidemiological information. The expected result is Negative. Fact Sheet for Patients:  PinkCheek.be Fact Sheet for Healthcare Providers:  GravelBags.it This test is not yet ap proved or cleared by the [place] FDA and  has been authorized for detection and/or diagnosis of SARS-CoV-2 by FDA under an Emergency Use Authorization (EUA). This EUA will remain  in effect (meaning this test can be used) for the duration of the COVID-19 declaration under Section 564(b)(1) of the Act, 21 U.S.C. section 360bbb-3(b)(1), unless the authorization is terminated or revoked sooner.    Influenza A by PCR NEGATIVE NEGATIVE Final   Influenza B by PCR NEGATIVE NEGATIVE Final    Comment: (NOTE) The Xpert Xpress SARS-CoV-2/FLU/RSV assay is intended as an aid in  the diagnosis of influenza from Nasopharyngeal swab specimens and  should not be used as a sole basis for treatment. Nasal washings and  aspirates are unacceptable for Xpert Xpress SARS-CoV-2/FLU/RSV  testing. Fact Sheet for Patients: PinkCheek.be Fact Sheet for Healthcare Providers: GravelBags.it This test is not yet approved or cleared by the [place] FDA and  has been authorized for detection and/or diagnosis of SARS-CoV-2 by  FDA under an Emergency Use Authorization (EUA). This EUA will remain  in effect (meaning this test can be used) for the duration of the  Covid-19 declaration under Section 564(b)(1) of the Act, 21  U.S.C. section 360bbb-3(b)(1), unless the authorization is  terminated or revoked. Performed at [facility], [place] [address] [place], [place] [address]     Radiology Studies: No results found.   Cairo Lingenfelter T. Landfall  If 7PM-7AM, please contact night-coverage [email or web address] Password [facility] 01/27/2020, 11:59 AM

## 2020-01-28 ENCOUNTER — Encounter (HOSPITAL_COMMUNITY): Payer: Self-pay | Admitting: Internal Medicine

## 2020-01-28 ENCOUNTER — Inpatient Hospital Stay (HOSPITAL_COMMUNITY): Payer: Self-pay

## 2020-01-28 DIAGNOSIS — K750 Abscess of liver: Secondary | ICD-10-CM

## 2020-01-28 LAB — CBC
HCT: 24.7 % — ABNORMAL LOW (ref 39.0–52.0)
Hemoglobin: 7.7 g/dL — ABNORMAL LOW (ref 13.0–17.0)
MCH: 25.2 pg — ABNORMAL LOW (ref 26.0–34.0)
MCHC: 31.2 g/dL (ref 30.0–36.0)
MCV: 80.7 fL (ref 80.0–100.0)
Platelets: 392 10*3/uL (ref 150–400)
RBC: 3.06 MIL/uL — ABNORMAL LOW (ref 4.22–5.81)
RDW: 18 % — ABNORMAL HIGH (ref 11.5–15.5)
WBC: 9 10*3/uL (ref 4.0–10.5)
nRBC: 0 % (ref 0.0–0.2)

## 2020-01-28 LAB — RENAL FUNCTION PANEL
Albumin: 1.2 g/dL — ABNORMAL LOW (ref 3.5–5.0)
Anion gap: 10 (ref 5–15)
BUN: 19 mg/dL (ref 8–23)
CO2: 23 mmol/L (ref 22–32)
Calcium: 7.5 mg/dL — ABNORMAL LOW (ref 8.9–10.3)
Chloride: 100 mmol/L (ref 98–111)
Creatinine, Ser: 0.79 mg/dL (ref 0.61–1.24)
GFR calc Af Amer: >60 mL/min (ref >60)
GFR calc non Af Amer: >60 mL/min (ref >60)
Glucose, Bld: 96 mg/dL (ref 70–99)
Phosphorus: 4.1 mg/dL (ref 2.5–4.6)
Potassium: 4.4 mmol/L (ref 3.5–5.1)
Sodium: 133 mmol/L — ABNORMAL LOW (ref 135–145)

## 2020-01-28 LAB — MAGNESIUM: Magnesium: 2.4 mg/dL (ref 1.7–2.4)

## 2020-01-28 LAB — PROTIME-INR
INR: 1.4 — ABNORMAL HIGH (ref 0.8–1.2)
Prothrombin Time: 17.2 seconds — ABNORMAL HIGH (ref 11.4–15.2)

## 2020-01-28 MED ORDER — TETRACYCLINE HCL 250 MG PO CAPS: 250.0000 mg | ORAL_CAPSULE | Freq: Four times a day (QID) | ORAL | 0 refills | Status: DC

## 2020-01-28 MED ORDER — PIPERACILLIN-TAZOBACTAM 3.375 G IVPB
3.3750 g | Freq: Three times a day (TID) | INTRAVENOUS | Status: DC
  Administered 2020-01-28 – 2020-01-30 (×6): 3.375 g via INTRAVENOUS
  Filled 2020-01-28 (×8): qty 50

## 2020-01-28 MED ORDER — FENTANYL CITRATE (PF) 100 MCG/2ML IJ SOLN
INTRAMUSCULAR | Status: AC | PRN
  Administered 2020-01-28: 25 ug via INTRAVENOUS

## 2020-01-28 MED ORDER — ONDANSETRON HCL 4 MG PO TABS: 4.0000 mg | ORAL_TABLET | Freq: Every day | ORAL | 1 refills | Status: DC | PRN

## 2020-01-28 MED ORDER — PANTOPRAZOLE SODIUM 40 MG PO TBEC: DELAYED_RELEASE_TABLET | ORAL | 1 refills | Status: DC

## 2020-01-28 MED ORDER — METRONIDAZOLE 250 MG PO TABS: 250.0000 mg | ORAL_TABLET | Freq: Four times a day (QID) | ORAL | 0 refills | Status: DC

## 2020-01-28 MED ORDER — SODIUM CHLORIDE 0.9 % IV SOLN
INTRAVENOUS | Status: DC | PRN
  Administered 2020-01-28 – 2020-01-30 (×2): 250 mL via INTRAVENOUS
  Administered 2020-02-02: 1000 mL via INTRAVENOUS

## 2020-01-28 MED ORDER — FENTANYL CITRATE (PF) 100 MCG/2ML IJ SOLN
INTRAMUSCULAR | Status: AC
  Filled 2020-01-28: qty 2

## 2020-01-28 MED ORDER — SODIUM CHLORIDE 0.9% FLUSH
5.0000 mL | Freq: Three times a day (TID) | INTRAVENOUS | Status: DC
  Administered 2020-01-28 – 2020-02-03 (×9): 5 mL

## 2020-01-28 MED ORDER — ENSURE ENLIVE PO LIQD: 237.0000 mL | Freq: Three times a day (TID) | ORAL | 0 refills | Status: AC

## 2020-01-28 MED ORDER — MORPHINE SULFATE (PF) 2 MG/ML IV SOLN
2.0000 mg | INTRAVENOUS | Status: DC | PRN
  Administered 2020-01-28 – 2020-01-29 (×3): 2 mg via INTRAVENOUS
  Filled 2020-01-28 (×3): qty 1

## 2020-01-28 MED ORDER — FERROUS SULFATE 325 (65 FE) MG PO TABS: 325.0000 mg | ORAL_TABLET | Freq: Two times a day (BID) | ORAL | 1 refills | Status: DC

## 2020-01-28 MED ORDER — PEPTO-BISMOL 524 MG/30ML PO SUSP: 30.0000 mL | Freq: Three times a day (TID) | ORAL | 0 refills | Status: DC

## 2020-01-28 MED ORDER — MULTI-VITAMIN/MINERALS PO TABS: 1.0000 | ORAL_TABLET | Freq: Every day | ORAL | 1 refills | Status: AC

## 2020-01-28 MED ORDER — SODIUM CHLORIDE 0.9 % IV SOLN
INTRAVENOUS | Status: AC | PRN
  Administered 2020-01-28: 10 mL/h via INTRAVENOUS

## 2020-01-28 MED ORDER — MIDAZOLAM HCL 2 MG/2ML IJ SOLN
INTRAMUSCULAR | Status: AC | PRN
  Administered 2020-01-28: 0.5 mg via INTRAVENOUS

## 2020-01-28 MED ORDER — MIDAZOLAM HCL 2 MG/2ML IJ SOLN
INTRAMUSCULAR | Status: AC
  Filled 2020-01-28: qty 2

## 2020-01-28 MED ORDER — SENNOSIDES-DOCUSATE SODIUM 8.6-50 MG PO TABS: 1.0000 | ORAL_TABLET | Freq: Every day | ORAL | 1 refills | Status: DC

## 2020-01-28 MED ORDER — LIDOCAINE HCL 1 % IJ SOLN
INTRAMUSCULAR | Status: AC
  Filled 2020-01-28: qty 20

## 2020-01-28 MED ORDER — NICOTINE 14 MG/24HR TD PT24: 14.0000 mg | MEDICATED_PATCH | Freq: Every day | TRANSDERMAL | 0 refills | Status: DC

## 2020-01-28 MED FILL — STOMACH RELIEF 525 MG/30ML: 525 | 2 days supply | Qty: 237 | Fill #0

## 2020-01-28 MED FILL — CERTAVITE/ANTIOXIDANTS TABS: 90 days supply | Qty: 90 | Fill #0

## 2020-01-28 MED FILL — SENEXON-S 8.6-50 MG TABS: 8.6-50 | 90 days supply | Qty: 90 | Fill #0

## 2020-01-28 MED FILL — metroNIDAZOLE 250 MG TABS: 250 | 10 days supply | Qty: 40 | Fill #0

## 2020-01-28 MED FILL — ONDANSETRON HCL 4 MG TABLET: 4 | 30 days supply | Qty: 30 | Fill #0

## 2020-01-28 MED FILL — PANTOPRAZOLE SOD DR 40 MG T: 40 | 30 days supply | Qty: 40 | Fill #0

## 2020-01-28 MED FILL — FERROUS SULFATE 325 MG TAB: 325 (65 FE) | 90 days supply | Qty: 180 | Fill #0

## 2020-01-28 MED FILL — TETRACYCLINE HCL 250 MG CAP: 250 | 10 days supply | Qty: 40 | Fill #0

## 2020-01-28 NOTE — Progress Notes (Signed)
PROGRESS NOTE  Kevin [name] [id number] DOB: 1951/01/24   PCP: Patient, No Pcp Per   Patient is from: home  DOA: 01/21/2020 LOS: 6  Brief Narrative / Interim history: 69 year old Spanish male with history of tobacco use disorder presented to ED 2/12 with difficulty eating and unintentional weight loss of about 65 pounds.  Also had melanotic stool.  In ED, CT abdomen and pelvis revealed large necrotic mass in the posterior wall of the stomach and irregular hypodense mass in the right lobe of the liver concerning for metastatic disease.  Hgb 6.2 with normal MCV.   Patient received 2 units of packed red blood cells with appropriate response.  Started on PPI.  GI consulted, and he had EGD and biopsy of the mass on 2/15.  Pathology consistent with gastric stromal tumor.  Also positive for H. pylori infection.  General surgery consulted and recommended neoadjuvant Gleevec prior to resection.  Oncology consulted and started [place], and ordered MRI liver which revealed complex multilocular 10.7 x 8.2 cm cystic right liver mass that has increased in size from his recent CT about a week ago.  IR consulted for PCI.  Subjective: Spiked fever to 101.2 yesterday afternoon.  No major events overnight or this morning.  No complaint this morning.  He denies chest pain, dyspnea, cough, GI or UTI symptoms.  We discussed about MRI finding and the plan for today.  Video interpreter with ID number [id number] used for this encounter. Objective: Vitals:   01/27/20 1628 01/27/20 1953 01/27/20 2338 01/28/20 0515  BP: 115/66 (!) 101/57 98/68 107/67  Pulse: 100 94 74 92  Resp: 19 20 20 20   Temp: (!) 101.2 F (38.4 C) 100.1 F (37.8 C) 98.1 F (36.7 C) 98 F (36.7 C)  TempSrc: Oral Oral Oral Oral  SpO2: 95% 95% 98% 95%  Weight:    55.1 kg  Height:        Intake/Output Summary (Last 24 hours) at 01/28/2020 1049 Last data filed at 01/28/2020 0500 Gross per 24 hour  Intake 620 ml  Output 300 ml  Net 320 ml     Filed Weights   01/26/20 0421 01/27/20 0428 01/28/20 0515  Weight: 55.3 kg 53.9 kg 55.1 kg    Examination:  GENERAL: No acute distress.  Appears well.  HEENT: MMM.  Vision and hearing grossly intact.  NECK: Supple.  No apparent JVD.  RESP:  No IWOB. Good air movement bilaterally. CVS:  RRR. Heart sounds normal.  ABD/GI/GU: Bowel sounds present. Soft. Non tender.  MSK/EXT:  Moves extremities. No apparent deformity. No edema.  SKIN: no apparent skin lesion or wound NEURO: Awake, alert and oriented appropriately.  No apparent focal neuro deficit. PSYCH: Calm. Normal affect.  Procedures:  EGD on 2/15 revealed normal esophagus, a large infiltrative mass with no bleeding was found in the posterior wall of the stomach.   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  Pathology confirmed gastric stromal tumor. -General surgery recommends neoadjuvant chemotherapy prior to resection -Oncology started Gleevec on 2/18  Cystic right liver mass: MRI on 2/19 revealed complex multilocular 10.7 x 8.2 cm cystic liver mass that has increased in size in short interval since the CT on 2/12.  Concerned this could be infectious in the setting of his fever.  -IR consulted for drainage.  Will send fluid for culture -Discussed with ID, Dr. Tommy Medal. Zosyn after IR drainage  Gastritis/H. pylori infection -Continue PPI -H. pylori treatment-upon  discharge  Iron deficiency anemia due to chronic blood loss likely from GI tumor.  Iron sat 7%. -Hgb 6.2 (admit)>2u>> 8.7> 8.1> 7.7 -IV Feraheme on 2/18.   Tobacco use disorder -Encouraged cessation -Continue nicotine patch  Fever: unclear etiology. No clinical clues to suggest a specific source.  Reports intermittent cough which is chronic for him. -Obtain 2 view chest x-ray  Hyponatremia: Resolved.  Metabolic acidosis: Improved.  Unintentional weight loss -Appreciate dietitian input-supplements and  multivitamins Nutrition Problem: Inadequate oral intake Etiology: altered GI function(large necrotic stomach mass concerning for gastric adenocarcinoma)  Signs/Symptoms: per patient/family report(poor appetite due to early satiety and endorses 65 lb wt loss over the past 3 months)  Interventions: Ensure Enlive (each supplement provides 350kcal and 20 grams of protein), Magic cup, MVI   DVT prophylaxis: SCD Code Status: Full code Family Communication: Patient and/or RN. Available if any question.  Discharge barrier: Possible liver abscess, fever Patient is from: Home Final disposition: Likely home after IR drainage of the liver abscess and culture data. 48 to 72 hours at the earliest  Consultants: GI (off), general surgery, oncology, IR, ID   Microbiology summarized: COVID-19 negative. Influenza PCR negative.  Sch Meds:  Scheduled Meds: . sodium chloride   Intravenous Once  . feeding supplement (ENSURE ENLIVE)  237 mL Oral TID BM  . ferrous sulfate  325 mg Oral BID WC  . imatinib  400 mg Oral Q breakfast  . influenza vaccine adjuvanted  0.5 mL Intramuscular Tomorrow-1000  . multivitamin with minerals  1 tablet Oral Daily  . nicotine  14 mg Transdermal Daily  . pantoprazole  40 mg Oral BID  . pneumococcal 23 valent vaccine  0.5 mL Intramuscular Tomorrow-1000   Continuous Infusions: PRN Meds:.acetaminophen **OR** acetaminophen, ondansetron (ZOFRAN) IV  Antimicrobials: Anti-infectives (From admission, onward)   Start     Dose/Rate Route Frequency Ordered Stop   01/28/20 0000  metroNIDAZOLE (FLAGYL) 250 MG tablet     250 mg Oral 4 times daily 01/28/20 0716 02/07/20 2359   01/28/20 0000  tetracycline (SUMYCIN) 250 MG capsule     250 mg Oral 4 times daily 01/28/20 0716         I have personally reviewed the following labs and images: CBC: Recent Labs  Lab 01/21/20 1839 01/21/20 1839 01/22/20 1019 01/23/20 0608 01/24/20 1327 01/27/20 0755 01/28/20 0428  WBC 8.3  --   8.5 9.1 10.2  --  9.0  NEUTROABS 6.2  --   --   --   --   --   --   HGB 6.2*   < > 8.6* 8.8* 8.7* 8.1* 7.7*  HCT 21.2*   < > 27.0* 28.5* 28.2* 26.0* 24.7*  MCV 78.8*  --  80.1 81.9 82.0  --  80.7  PLT 420*  --  369 450* 445*  --  392   < > = values in this interval not displayed.   BMP &GFR Recent Labs  Lab 01/21/20 1839 01/22/20 1019 01/23/20 0608 01/28/20 0428  NA 133* 134* 135 133*  K 4.2 3.8 3.6 4.4  CL 101 103 101 100  CO2 23 20* 21* 23  GLUCOSE 129* 97 103* 96  BUN 18 13 14 19   CREATININE 0.90 0.74 0.83 0.79  CALCIUM 7.8* 7.4* 7.6* 7.5*  MG  --  2.2  --  2.4  PHOS  --   --   --  4.1   Estimated Creatinine Clearance: 69.8 mL/min (by C-G formula based on SCr of  0.79 mg/dL). Liver & Pancreas: Recent Labs  Lab 01/21/20 1839 01/22/20 1019 01/23/20 0608 01/28/20 0428  AST 55* 52* 51*  --   ALT 31 29 31   --   ALKPHOS 103 88 98  --   BILITOT 0.5 1.4* 1.0  --   PROT 6.3* 5.6* 5.5*  --   ALBUMIN 1.5* 1.4* 1.2* 1.2*   Recent Labs  Lab 01/21/20 1839  LIPASE 19   No results for input(s): AMMONIA in the last 168 hours. Diabetic: No results for input(s): HGBA1C in the last 72 hours. No results for input(s): GLUCAP in the last 168 hours. Cardiac Enzymes: No results for input(s): CKTOTAL, CKMB, CKMBINDEX, TROPONINI in the last 168 hours. No results for input(s): PROBNP in the last 8760 hours. Coagulation Profile: No results for input(s): INR, PROTIME in the last 168 hours. Thyroid Function Tests: No results for input(s): TSH, T4TOTAL, FREET4, T3FREE, THYROIDAB in the last 72 hours. Lipid Profile: No results for input(s): CHOL, HDL, LDLCALC, TRIG, CHOLHDL, LDLDIRECT in the last 72 hours. Anemia Panel: Recent Labs    01/26/20 1426  VITAMINB12 1,177*  FOLATE 16.1  FERRITIN 507*  TIBC 143*  IRON 10*  RETICCTPCT 1.0   Urine analysis:    Component Value Date/Time   COLORURINE YELLOW 01/22/2020 0418   APPEARANCEUR CLEAR 01/22/2020 0418   LABSPEC 1.040 (H)  01/22/2020 0418   PHURINE 5.0 01/22/2020 0418   GLUCOSEU NEGATIVE 01/22/2020 0418   HGBUR NEGATIVE 01/22/2020 0418   BILIRUBINUR NEGATIVE 01/22/2020 0418   KETONESUR NEGATIVE 01/22/2020 0418   PROTEINUR NEGATIVE 01/22/2020 0418   NITRITE NEGATIVE 01/22/2020 0418   LEUKOCYTESUR NEGATIVE 01/22/2020 0418   Sepsis Labs: Invalid input(s): PROCALCITONIN, [place]  Microbiology: Recent Results (from the past 240 hour(s))  Respiratory Panel by RT PCR (Flu A&B, Covid) - Nasopharyngeal Swab     Status: None   Collection Time: 01/21/20 11:20 PM   Specimen: Nasopharyngeal Swab  Result Value Ref Range Status   SARS Coronavirus 2 by RT PCR NEGATIVE NEGATIVE Final    Comment: (NOTE) SARS-CoV-2 target nucleic acids are NOT DETECTED. The SARS-CoV-2 RNA is generally detectable in upper respiratoy specimens during the acute phase of infection. The lowest concentration of SARS-CoV-2 viral copies this assay can detect is 131 copies/mL. A negative result does not preclude SARS-Cov-2 infection and should not be used as the sole basis for treatment or other patient management decisions. A negative result may occur with  improper specimen collection/handling, submission of specimen other than nasopharyngeal swab, presence of viral mutation(s) within the areas targeted by this assay, and inadequate number of viral copies (<131 copies/mL). A negative result must be combined with clinical observations, patient history, and epidemiological information. The expected result is Negative. Fact Sheet for Patients:  PinkCheek.be Fact Sheet for Healthcare Providers:  GravelBags.it This test is not yet ap proved or cleared by the [place] FDA and  has been authorized for detection and/or diagnosis of SARS-CoV-2 by FDA under an Emergency Use Authorization (EUA). This EUA will remain  in effect (meaning this test can be used) for the duration of  the COVID-19 declaration under Section 564(b)(1) of the Act, 21 U.S.C. section 360bbb-3(b)(1), unless the authorization is terminated or revoked sooner.    Influenza A by PCR NEGATIVE NEGATIVE Final   Influenza B by PCR NEGATIVE NEGATIVE Final    Comment: (NOTE) The Xpert Xpress SARS-CoV-2/FLU/RSV assay is intended as an aid in  the diagnosis of influenza from Nasopharyngeal swab specimens and  should not be used as a sole basis for treatment. Nasal washings and  aspirates are unacceptable for Xpert Xpress SARS-CoV-2/FLU/RSV  testing. Fact Sheet for Patients: PinkCheek.be Fact Sheet for Healthcare Providers: GravelBags.it This test is not yet approved or cleared by the [place] FDA and  has been authorized for detection and/or diagnosis of SARS-CoV-2 by  FDA under an Emergency Use Authorization (EUA). This EUA will remain  in effect (meaning this test can be used) for the duration of the  Covid-19 declaration under Section 564(b)(1) of the Act, 21  U.S.C. section 360bbb-3(b)(1), unless the authorization is  terminated or revoked. Performed at [facility], [place] [address] [place],  [address]     Radiology Studies: DG Chest 2 View  Result Date: 01/27/2020 CLINICAL DATA:  Fever EXAM: CHEST - 2 VIEW COMPARISON:  01/22/2020 FINDINGS: The heart size and mediastinal contours are within normal limits. Both lungs are clear. Disc degenerative disease of the thoracic spine. IMPRESSION: No acute abnormality of the lungs. Electronically Signed   By: Eddie Candle M.D.   On: 01/27/2020 13:04   MR LIVER W WO CONTRAST  Result Date: 01/28/2020 CLINICAL DATA:  Inpatient. Weight loss. Low-grade fever. 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 Indeterminate liver mass on recent CT. EXAM: MRI ABDOMEN WITHOUT AND WITH CONTRAST TECHNIQUE: Multiplanar multisequence MR imaging of the abdomen was performed both before and  after the administration of intravenous contrast. CONTRAST:  73mL GADAVIST GADOBUTROL 1 MMOL/ML IV SOLN COMPARISON:  01/21/2020 CT abdomen/pelvis. FINDINGS: Lower chest: Trace dependent right pleural effusion. Hepatobiliary: Top-normal liver size. 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 There is a complex multilocular 10.7 x 8.2 cm cystic liver mass in segment 7/8 right liver lobe (series 10/image 9) with restricted diffusion, thickened enhancing wall and thickened irregular internal septations, increased in size from 7.0 x 5.1 cm on 01/21/2020 CT using similar measurement technique. No additional liver lesions. Normal gallbladder with no cholelithiasis. No biliary ductal dilatation. Common bile duct diameter 3 mm. No choledocholithiasis. No biliary strictures, masses or beading. Pancreas: No pancreatic mass or duct dilation.  No pancreas divisum. Spleen: Normal size spleen. Splenic hemosiderosis. No splenic mass. Adrenals/Urinary Tract: Normal adrenals. No hydronephrosis. Simple 2.5 cm lateral upper left renal cyst. Additional subcentimeter simple lower left renal cyst. No suspicious renal masses. Stomach/Bowel: Large exophytic centrally necrotic 17.3 x 12.3 cm gastric mass arising from posterior proximal stomach centered in the left upper quadrant (series 11/image 21) with thick enhancing wall, previously 17.0 x 11.0 cm on recent CT, not substantially changed. Visualized small and large bowel is normal caliber, with no bowel wall thickening. Vascular/Lymphatic: Normal caliber abdominal aorta. Patent portal, splenic, hepatic and renal veins. No pathologically enlarged lymph nodes in the abdomen. Other: Trace perihepatic ascites.  No focal fluid collection. Musculoskeletal: No aggressive appearing focal osseous lesions. IMPRESSION: 1. Complex multilocular 10.7 x 8.2 cm cystic right liver mass in segment 7/8, increased in size in the short interval since 01/21/2020 CT, indeterminate but with MRI features most  suggestive of a liver abscess. Consider IR consultation for consideration of percutaneous drainage. 2. Large exophytic centrally necrotic 17.3 x 12.3 cm enhancing gastric mass arising from the posterior proximal stomach, compatible with biopsy-proven GIST. 3. Trace dependent right pleural effusion.  Trace ascites. 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 Electronically Signed   By: Ilona Sorrel M.D.   On: 01/28/2020 07:38     Nicollette Wilhelmi T. [place]  If 7PM-7AM, please contact night-coverage [email or web address]  Password [email or web address] 01/28/2020, 10:49 AM

## 2020-01-28 NOTE — Progress Notes (Addendum)
HEMATOLOGY-ONCOLOGY PROGRESS NOTE  SUBJECTIVE: Resting quietly this afternoon.  No bleeding has been noted.  He is not having any abdominal pain, nausea, vomiting.  Fever up to 101.2 yesterday afternoon.  He underwent drainage of his liver abscess earlier today by IR.  Fluid has been sent for culture. He has no complaints today.   PHYSICAL EXAMINATION:  Vitals:   01/28/20 1324 01/28/20 1400  BP: (!) 107/54 103/61  Pulse: 88 91  Resp:  (!) 28  Temp:    SpO2:  93%   Filed Weights   01/26/20 0421 01/27/20 0428 01/28/20 0515  Weight: 121 lb 14.6 oz (55.3 kg) 118 lb 13.3 oz (53.9 kg) 121 lb 7.6 oz (55.1 kg)    Intake/Output from previous day: 02/18 0701 - 02/19 0700 In: 840 [P.O.:840] Out: 300 [Urine:300]  GENERAL:alert, no distress and comfortable LUNGS: clear to auscultation and percussion with normal breathing effort HEART: regular rate & rhythm and no murmurs and no lower extremity edema ABDOMEN: Positive bowel sounds, soft, nontender, palpable mass in the left upper quadrant of the abdomen NEURO: alert & oriented x 3 with fluent speech, no focal motor/sensory deficits  LABORATORY DATA:  I have reviewed the data as listed CMP Latest Ref Rng & Units 01/28/2020 01/23/2020 01/22/2020  Glucose 70 - 99 mg/dL 96 103(H) 97  BUN 8 - 23 mg/dL 19 14 13   Creatinine 0.61 - 1.24 mg/dL 0.79 0.83 0.74  Sodium 135 - 145 mmol/L 133(L) 135 134(L)  Potassium 3.5 - 5.1 mmol/L 4.4 3.6 3.8  Chloride 98 - 111 mmol/L 100 101 103  CO2 22 - 32 mmol/L 23 21(L) 20(L)  Calcium 8.9 - 10.3 mg/dL 7.5(L) 7.6(L) 7.4(L)  Total Protein 6.5 - 8.1 g/dL - 5.5(L) 5.6(L)  Total Bilirubin 0.3 - 1.2 mg/dL - 1.0 1.4(H)  Alkaline Phos 38 - 126 U/L - 98 88  AST 15 - 41 U/L - 51(H) 52(H)  ALT 0 - 44 U/L - 31 29    Lab Results  Component Value Date   WBC 9.0 01/28/2020   HGB 7.7 (L) 01/28/2020   HCT 24.7 (L) 01/28/2020   MCV 80.7 01/28/2020   PLT 392 01/28/2020   NEUTROABS 6.2 01/21/2020    DG Chest 2  View  Result Date: 01/27/2020 CLINICAL DATA:  Fever EXAM: CHEST - 2 VIEW COMPARISON:  01/22/2020 FINDINGS: The heart size and mediastinal contours are within normal limits. Both lungs are clear. Disc degenerative disease of the thoracic spine. IMPRESSION: No acute abnormality of the lungs. Electronically Signed   By: Eddie Candle M.D.   On: 01/27/2020 13:04   CT ABDOMEN PELVIS W CONTRAST  Result Date: 01/21/2020 CLINICAL DATA:  Loss of appetite EXAM: CT ABDOMEN AND PELVIS WITH CONTRAST TECHNIQUE: Multidetector CT imaging of the abdomen and pelvis was performed using the standard protocol following bolus administration of intravenous contrast. CONTRAST:  130mL OMNIPAQUE IOHEXOL 300 MG/ML  SOLN COMPARISON:  None. FINDINGS: Lower chest: No acute pleural or parenchymal lung disease. Hepatobiliary: Hypodense mass right lobe liver measures up to 5.2 x 6.9 cm coarsened for metastatic disease. Gallbladder is unremarkable. Pancreas: Unremarkable. No pancreatic ductal dilatation or surrounding inflammatory changes. Spleen: Normal in size without focal abnormality. Adrenals/Urinary Tract: Left renal cyst. Areas of right renal cortical scarring are noted. No obstructive uropathy. The adrenals are normal. Bladder is unremarkable. Stomach/Bowel: There is a large necrotic mass centered in the gastric body and antrum, measuring 16.5 by 10 cm in transverse dimension, and extending approximately 17.3 cm  in craniocaudal length. Findings are consistent with gastric adenocarcinoma. This mass results and mass effect upon the upper abdominal viscera but I do not see any direct invasion. No bowel obstruction.  Normal appendix. Vascular/Lymphatic: Vascular structures are patent. No critical stenosis. I do not see any discrete adenopathy within the abdomen or pelvis. Reproductive: The prostate is enlarged measuring 5.3 x 5.3 cm in transverse dimension and extending approximately 6.8 cm in craniocaudal length. There is mass effect upon  the posterior wall the bladder. Other: No free fluid.  No free intraperitoneal gas. Musculoskeletal: There are no acute or destructive bony lesions. Reconstructed images demonstrate no additional findings. IMPRESSION: 1. Large necrotic mass centered in the posterior wall of the stomach, consistent with gastric adenocarcinoma. Endoscopy is recommended for further evaluation. 2. Irregular hypodense mass right lobe liver, worrisome for metastatic disease. 3. Enlarged prostate. Electronically Signed   By: Randa Ngo M.D.   On: 01/21/2020 21:56   MR LIVER W WO CONTRAST  Result Date: 01/28/2020 CLINICAL DATA:  Inpatient. Weight loss. Low-grade fever. 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 Indeterminate liver mass on recent CT. EXAM: MRI ABDOMEN WITHOUT AND WITH CONTRAST TECHNIQUE: Multiplanar multisequence MR imaging of the abdomen was performed both before and after the administration of intravenous contrast. CONTRAST:  80mL GADAVIST GADOBUTROL 1 MMOL/ML IV SOLN COMPARISON:  01/21/2020 CT abdomen/pelvis. FINDINGS: Lower chest: Trace dependent right pleural effusion. Hepatobiliary: Top-normal liver size. 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 There is a complex multilocular 10.7 x 8.2 cm cystic liver mass in segment 7/8 right liver lobe (series 10/image 9) with restricted diffusion, thickened enhancing wall and thickened irregular internal septations, increased in size from 7.0 x 5.1 cm on 01/21/2020 CT using similar measurement technique. No additional liver lesions. Normal gallbladder with no cholelithiasis. No biliary ductal dilatation. Common bile duct diameter 3 mm. No choledocholithiasis. No biliary strictures, masses or beading. Pancreas: No pancreatic mass or duct dilation.  No pancreas divisum. Spleen: Normal size spleen. Splenic hemosiderosis. No splenic mass. Adrenals/Urinary Tract: Normal adrenals. No hydronephrosis. Simple 2.5 cm lateral upper left renal cyst. Additional subcentimeter  simple lower left renal cyst. No suspicious renal masses. Stomach/Bowel: Large exophytic centrally necrotic 17.3 x 12.3 cm gastric mass arising from posterior proximal stomach centered in the left upper quadrant (series 11/image 21) with thick enhancing wall, previously 17.0 x 11.0 cm on recent CT, not substantially changed. Visualized small and large bowel is normal caliber, with no bowel wall thickening. Vascular/Lymphatic: Normal caliber abdominal aorta. Patent portal, splenic, hepatic and renal veins. No pathologically enlarged lymph nodes in the abdomen. Other: Trace perihepatic ascites.  No focal fluid collection. Musculoskeletal: No aggressive appearing focal osseous lesions. IMPRESSION: 1. Complex multilocular 10.7 x 8.2 cm cystic right liver mass in segment 7/8, increased in size in the short interval since 01/21/2020 CT, indeterminate but with MRI features most suggestive of a liver abscess. Consider IR consultation for consideration of percutaneous drainage. 2. Large exophytic centrally necrotic 17.3 x 12.3 cm enhancing gastric mass arising from the posterior proximal stomach, compatible with biopsy-proven GIST. 3. Trace dependent right pleural effusion.  Trace ascites. 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 Electronically Signed   By: Ilona Sorrel M.D.   On: 01/28/2020 07:38   DG CHEST PORT 1 VIEW  Result Date: 01/22/2020 CLINICAL DATA:  Unable to eat, loss of appetite, no abdominal pain EXAM: PORTABLE CHEST 1 VIEW COMPARISON:  None. FINDINGS: Mixed streaky and patchy opacities are present in lung bases more focally in  the retrocardiac/left lung base. No pneumothorax or effusion. The cardiomediastinal contours are unremarkable. No acute osseous or soft tissue abnormality. Degenerative changes are present in the imaged spine and shoulders. Telemetry leads overlie the chest. IMPRESSION: Mixed streaky and patchy opacities in the bases favored to reflect atelectasis though early infection could have a  similar appearance. Electronically Signed   By: Lovena Le M.D.   On: 01/22/2020 00:18    ASSESSMENT AND PLAN: 1. GIST of the stomach 2. Liver lesion in the right lobe  -Liver mass consistent with abscess on MRI of the liver from 01/27/2020  -Status post CT-guided drainage of the liver abscess on 01/28/2020 3. Iron deficiency anemia 4. Gastritis and H. pylori infection 5. Fever of unclear etiology  6. Protein calorie malnutrition  Mr. Tyska appears stable.  Plan to begin treatment [place] for GIST.  MRI of the liver was discussed with the patient which showed that the mass noted on the prior CT had grown in size and was most consistent with a liver abscess.  Liver abscesses likely the cause of his fever. He is status post CT-guided drainage of this abscess and fluid has been sent for culture.  These results are pending.  Hemoglobin is overall stable.  He is on oral iron.  Recommendations: 1.  We will follow up on cultures of liver abscess.  Discussed with hospitalist who is planning to start him on IV antibiotics. 2.  We will start the patient on Gleevec 400 mg daily.  Adverse effects of this medication have been discussed with the patient and he is agreeable with proceeding. 3. Hemoglobin currently stable. He does not report any active bleeding. Recommend close monitoring of CBC and transfuse to keep hemoglobin above 7.  Continue oral iron. 4.  Continue Protonix and treat H. pylori as recommended by Dr. Penelope Coop   LOS: 6 days   Mikey Bussing, [place], AGPCNP-BC, AOCNP 01/28/20 Mr. Boettcher was interviewed and examined.  A Spanish interpreter was available when I saw him this morning.  The plan is to begin imatinib today.  We reviewed potential toxicities associated with imatinib including the chance of hematologic toxicity, rash, diarrhea, and edema.  He agrees to proceed.  [facility] is working on obtaining outpatient imatinib for Mr. Laurin Coder.  He has a persistent fever.  The  liver MRI suggest an hepatic abscess.  He underwent drainage earlier today.  Please call oncology as needed over the weekend.  I will check on him 01/31/2020

## 2020-01-28 NOTE — H&P (Signed)
Chief Complaint: Liver abscess  Referring Physician(s): Mercy Riding, MD  Supervising Physician: Daryll Brod  Patient Status: [facility] - In-pt  History of Present Illness: Kevin [name] is a 69 y.o. male who presented to the ED c/o abdominal pain.  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  He also had a liver lesion which was concerning for metastatic disease.  MRI was obtained which showed a complex multilocular 10.7 x 8.2 cm cystic right liver mass in segment 7/8, increased in size in the short interval since 01/21/2020 CT, indeterminate but with MRI features most suggestive of a liver abscess.   We are asked to place a drain.  He is NPO. iPad interpreter used.  History reviewed. No pertinent past medical history.  Past Surgical History:  Procedure Laterality Date  . BIOPSY  01/24/2020   Procedure: BIOPSY;  Surgeon: Wonda Horner, MD;  Location: [place];  Service: Endoscopy;;  . ESOPHAGOGASTRODUODENOSCOPY (EGD) WITH PROPOFOL N/A 01/24/2020   Procedure: ESOPHAGOGASTRODUODENOSCOPY (EGD) WITH PROPOFOL;  Surgeon: Wonda Horner, MD;  Location: [facility] ENDOSCOPY;  Service: Endoscopy;  Laterality: N/A;  . SCLEROTHERAPY  01/24/2020   Procedure: SCLEROTHERAPY;  Surgeon: Wonda Horner, MD;  Location: [facility] ENDOSCOPY;  Service: Endoscopy;;    Allergies: Patient has no known allergies.  Medications: Prior to Admission medications   Medication Sig Start Date End Date Taking? Authorizing Provider  bismuth subsalicylate (PEPTO-BISMOL) 262 MG/15ML suspension Take 30 mLs by mouth 4 (four) times daily -  before meals and at bedtime. 01/28/20   Mercy Riding, MD  feeding supplement, ENSURE ENLIVE, (ENSURE ENLIVE) LIQD Take 237 mLs by mouth 3 (three) times daily between meals. 01/28/20 02/27/20  Mercy Riding, MD  ferrous sulfate 325 (65 FE) MG tablet Take 1 tablet (325 mg total) by mouth 2 (two) times daily with a meal. 01/28/20   Mercy Riding, MD    imatinib (GLEEVEC) 400 MG tablet Take 1 tablet (400 mg total) by mouth daily. Take with meals and large glass of water.Caution:Chemotherapy. 01/27/20   Ladell Pier, MD  metroNIDAZOLE (FLAGYL) 250 MG tablet Take 1 tablet (250 mg total) by mouth 4 (four) times daily for 10 days. 01/28/20 02/07/20  Mercy Riding, MD  Multiple Vitamins-Minerals (MULTIVITAMIN WITH MINERALS) tablet Take 1 tablet by mouth daily. 01/28/20 07/26/20  Mercy Riding, MD  nicotine (NICODERM CQ - DOSED IN MG/24 HOURS) 14 mg/24hr patch Place 1 patch (14 mg total) onto the skin daily. 01/28/20   Mercy Riding, MD  ondansetron (ZOFRAN) 4 MG tablet Take 1 tablet (4 mg total) by mouth daily as needed for nausea or vomiting. 01/28/20 01/27/21  Mercy Riding, MD  pantoprazole (PROTONIX) 40 MG tablet Take 1 tablet (40 mg total) by mouth 2 (two) times daily for 10 days, THEN 1 tablet (40 mg total) daily for 20 days. 01/28/20 02/27/20  Mercy Riding, MD  senna-docusate (SENOKOT S) 8.6-50 MG tablet Take 1 tablet by mouth daily. 01/28/20   Mercy Riding, MD  tetracycline (SUMYCIN) 250 MG capsule Take 1 capsule (250 mg total) by mouth 4 (four) times daily. 01/28/20   Mercy Riding, MD     Family History  Family history unknown: Yes    Social History   Socioeconomic History  . Marital status: Married    Spouse name: Not on file  . Number of children: 3  . Years  of education: Not on file  . Highest education level: Not on file  Occupational History  . Not on file  Tobacco Use  . Smoking status: Current Every Day Smoker    Types: Cigarettes  . Smokeless tobacco: Never Used  . Tobacco comment: 01/27/2020 - Pt denied smoking cigarettes  Substance and Sexual Activity  . Alcohol use: Not Currently  . Drug use: Not on file  . Sexual activity: Not on file  Other Topics Concern  . Not on file  Social History Narrative  . Not on file   Social Determinants of Health   Financial Resource Strain:   . Difficulty of Paying Living Expenses:  Not on file  Food Insecurity:   . Worried About Charity fundraiser in the Last Year: Not on file  . Ran Out of Food in the Last Year: Not on file  Transportation Needs:   . Lack of Transportation (Medical): Not on file  . Lack of Transportation (Non-Medical): Not on file  Physical Activity:   . Days of Exercise per Week: Not on file  . Minutes of Exercise per Session: Not on file  Stress:   . Feeling of Stress : Not on file  Social Connections:   . Frequency of Communication with Friends and Family: Not on file  . Frequency of Social Gatherings with Friends and Family: Not on file  . Attends Religious Services: Not on file  . Active Member of Clubs or Organizations: Not on file  . Attends Archivist Meetings: Not on file  . Marital Status: Not on file     Review of Systems: A 12 point ROS discussed and pertinent positives are indicated in the HPI above.  All other systems are negative.  Review of Systems  Vital Signs: BP 107/67 (BP Location: Left Arm)   Pulse 92   Temp 98 F (36.7 C) (Oral)   Resp 20   Ht 5\' 6"  (1.676 m)   Wt 55.1 kg   SpO2 95%   BMI 19.61 kg/m   Physical Exam Vitals reviewed.  Constitutional:      Appearance: Normal appearance.  HENT:     Head: Normocephalic and atraumatic.  Eyes:     Extraocular Movements: Extraocular movements intact.  Cardiovascular:     Rate and Rhythm: Normal rate and regular rhythm.  Pulmonary:     Effort: Pulmonary effort is normal. No respiratory distress.     Breath sounds: Normal breath sounds.  Abdominal:     Palpations: Abdomen is soft.     Tenderness: There is abdominal tenderness.  Musculoskeletal:        General: Normal range of motion.     Cervical back: Normal range of motion.  Skin:    General: Skin is warm and dry.  Neurological:     General: No focal deficit present.     Mental Status: He is alert and oriented to person, place, and time.  Psychiatric:        Mood and Affect: Mood normal.         Behavior: Behavior normal.        Thought Content: Thought content normal.        Judgment: Judgment normal.     Imaging: DG Chest 2 View  Result Date: 01/27/2020 CLINICAL DATA:  Fever EXAM: CHEST - 2 VIEW COMPARISON:  01/22/2020 FINDINGS: The heart size and mediastinal contours are within normal limits. Both lungs are clear. Disc degenerative disease of the thoracic spine.  IMPRESSION: No acute abnormality of the lungs. Electronically Signed   By: Eddie Candle M.D.   On: 01/27/2020 13:04   CT ABDOMEN PELVIS W CONTRAST  Result Date: 01/21/2020 CLINICAL DATA:  Loss of appetite EXAM: CT ABDOMEN AND PELVIS WITH CONTRAST TECHNIQUE: Multidetector CT imaging of the abdomen and pelvis was performed using the standard protocol following bolus administration of intravenous contrast. CONTRAST:  136mL OMNIPAQUE IOHEXOL 300 MG/ML  SOLN COMPARISON:  None. FINDINGS: Lower chest: No acute pleural or parenchymal lung disease. Hepatobiliary: Hypodense mass right lobe liver measures up to 5.2 x 6.9 cm coarsened for metastatic disease. Gallbladder is unremarkable. Pancreas: Unremarkable. No pancreatic ductal dilatation or surrounding inflammatory changes. Spleen: Normal in size without focal abnormality. Adrenals/Urinary Tract: Left renal cyst. Areas of right renal cortical scarring are noted. No obstructive uropathy. The adrenals are normal. Bladder is unremarkable. Stomach/Bowel: There is a large necrotic mass centered in the gastric body and antrum, measuring 16.5 by 10 cm in transverse dimension, and extending approximately 17.3 cm in craniocaudal length. Findings are consistent with gastric adenocarcinoma. This mass results and mass effect upon the upper abdominal viscera but I do not see any direct invasion. No bowel obstruction.  Normal appendix. Vascular/Lymphatic: Vascular structures are patent. No critical stenosis. I do not see any discrete adenopathy within the abdomen or pelvis. Reproductive: The  prostate is enlarged measuring 5.3 x 5.3 cm in transverse dimension and extending approximately 6.8 cm in craniocaudal length. There is mass effect upon the posterior wall the bladder. Other: No free fluid.  No free intraperitoneal gas. Musculoskeletal: There are no acute or destructive bony lesions. Reconstructed images demonstrate no additional findings. IMPRESSION: 1. Large necrotic mass centered in the posterior wall of the stomach, consistent with gastric adenocarcinoma. Endoscopy is recommended for further evaluation. 2. Irregular hypodense mass right lobe liver, worrisome for metastatic disease. 3. Enlarged prostate. Electronically Signed   By: Randa Ngo M.D.   On: 01/21/2020 21:56   MR LIVER W WO CONTRAST  Result Date: 01/28/2020 CLINICAL DATA:  Inpatient. Weight loss. Low-grade fever. 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 Indeterminate liver mass on recent CT. EXAM: MRI ABDOMEN WITHOUT AND WITH CONTRAST TECHNIQUE: Multiplanar multisequence MR imaging of the abdomen was performed both before and after the administration of intravenous contrast. CONTRAST:  71mL GADAVIST GADOBUTROL 1 MMOL/ML IV SOLN COMPARISON:  01/21/2020 CT abdomen/pelvis. FINDINGS: Lower chest: Trace dependent right pleural effusion. Hepatobiliary: Top-normal liver size. 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 There is a complex multilocular 10.7 x 8.2 cm cystic liver mass in segment 7/8 right liver lobe (series 10/image 9) with restricted diffusion, thickened enhancing wall and thickened irregular internal septations, increased in size from 7.0 x 5.1 cm on 01/21/2020 CT using similar measurement technique. No additional liver lesions. Normal gallbladder with no cholelithiasis. No biliary ductal dilatation. Common bile duct diameter 3 mm. No choledocholithiasis. No biliary strictures, masses or beading. Pancreas: No pancreatic mass or duct dilation.  No pancreas divisum. Spleen: Normal size spleen. Splenic  hemosiderosis. No splenic mass. Adrenals/Urinary Tract: Normal adrenals. No hydronephrosis. Simple 2.5 cm lateral upper left renal cyst. Additional subcentimeter simple lower left renal cyst. No suspicious renal masses. Stomach/Bowel: Large exophytic centrally necrotic 17.3 x 12.3 cm gastric mass arising from posterior proximal stomach centered in the left upper quadrant (series 11/image 21) with thick enhancing wall, previously 17.0 x 11.0 cm on recent CT, not substantially changed. Visualized small and large bowel is normal caliber, with no bowel wall thickening. Vascular/Lymphatic: Normal  caliber abdominal aorta. Patent portal, splenic, hepatic and renal veins. No pathologically enlarged lymph nodes in the abdomen. Other: Trace perihepatic ascites.  No focal fluid collection. Musculoskeletal: No aggressive appearing focal osseous lesions. IMPRESSION: 1. Complex multilocular 10.7 x 8.2 cm cystic right liver mass in segment 7/8, increased in size in the short interval since 01/21/2020 CT, indeterminate but with MRI features most suggestive of a liver abscess. Consider IR consultation for consideration of percutaneous drainage. 2. Large exophytic centrally necrotic 17.3 x 12.3 cm enhancing gastric mass arising from the posterior proximal stomach, compatible with biopsy-proven GIST. 3. Trace dependent right pleural effusion.  Trace ascites. 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 Electronically Signed   By: Ilona Sorrel M.D.   On: 01/28/2020 07:38   DG CHEST PORT 1 VIEW  Result Date: 01/22/2020 CLINICAL DATA:  Unable to eat, loss of appetite, no abdominal pain EXAM: PORTABLE CHEST 1 VIEW COMPARISON:  None. FINDINGS: Mixed streaky and patchy opacities are present in lung bases more focally in the retrocardiac/left lung base. No pneumothorax or effusion. The cardiomediastinal contours are unremarkable. No acute osseous or soft tissue abnormality. Degenerative changes are present in the imaged spine and shoulders.  Telemetry leads overlie the chest. IMPRESSION: Mixed streaky and patchy opacities in the bases favored to reflect atelectasis though early infection could have a similar appearance. Electronically Signed   By: Lovena Le M.D.   On: 01/22/2020 00:18    Labs:  CBC: Recent Labs    01/22/20 1019 01/22/20 1019 01/23/20 0608 01/24/20 1327 01/27/20 0755 01/28/20 0428  WBC 8.5  --  9.1 10.2  --  9.0  HGB 8.6*   < > 8.8* 8.7* 8.1* 7.7*  HCT 27.0*   < > 28.5* 28.2* 26.0* 24.7*  PLT 369  --  450* 445*  --  392   < > = values in this interval not displayed.    COAGS: No results for input(s): INR, APTT in the last 8760 hours.  BMP: Recent Labs    01/21/20 1839 01/22/20 1019 01/23/20 0608 01/28/20 0428  NA 133* 134* 135 133*  K 4.2 3.8 3.6 4.4  CL 101 103 101 100  CO2 23 20* 21* 23  GLUCOSE 129* 97 103* 96  BUN 18 13 14 19   CALCIUM 7.8* 7.4* 7.6* 7.5*  CREATININE 0.90 0.74 0.83 0.79  GFRNONAA >60 >60 >60 >60  GFRAA >60 >60 >60 >60    LIVER FUNCTION TESTS: Recent Labs    01/21/20 1839 01/22/20 1019 01/23/20 0608 01/28/20 0428  BILITOT 0.5 1.4* 1.0  --   AST 55* 52* 51*  --   ALT 31 29 31   --   ALKPHOS 103 88 98  --   PROT 6.3* 5.6* 5.5*  --   ALBUMIN 1.5* 1.4* 1.2* 1.2*    TUMOR MARKERS: No results for input(s): AFPTM, CEA, CA199, CHROMGRNA in the last 8760 hours.  Assessment and Plan:  Recent diagnosis of GIST with liver lesion.  MRI showed abscess.  Will proceed with image guided drain placement today by Dr. Annamaria Boots.  Risks and benefits discussed with the patient including bleeding, infection, damage to adjacent structures, bowel perforation/fistula connection, and sepsis.  All of the patient's questions were answered, patient is agreeable to proceed. Consent signed and in chart.  Thank you for this interesting consult.  I greatly enjoyed meeting Mark Gavia and look forward to participating in their care.  A copy of this report was sent to the requesting  provider  on this date.  Electronically Signed: Murrell Redden, PA-C   01/28/2020, 10:14 AM      I spent a total of 40 Minutes  in face to face in clinical consultation, greater than 50% of which was counseling/coordinating care for liver abscess drain.

## 2020-01-28 NOTE — TOC Progression Note (Signed)
Transition of Care [facility]) - Progression Note    Patient Details  Name: Kevin [name] MRN: [id number] Date of Birth: 1951/07/30  Transition of Care [facility]) CM/SW Contact  Zenon Mayo, RN Phone Number: 01/28/2020, 8:57 AM  Clinical Narrative:    NCM spoke with intrepreter who is in the room, [place].  She is asking patient if he has at least 25.00 to pay for meds.  She will call this NCM  Back.         Expected Discharge Plan and Services           Expected Discharge Date: 01/28/20                                     Social Determinants of Health (SDOH) Interventions    Readmission Risk Interventions No flowsheet data found.

## 2020-01-28 NOTE — Procedures (Signed)
Liver abscess  S/p 12 fr CT drain 130cc pus asp cx sent No comp ebl min Full report in pacs

## 2020-01-28 NOTE — Progress Notes (Signed)
Pharmacy Antibiotic Note  Kevin [name] is a 69 y.o. male admitted on 01/21/2020 with intra-abdominal abscess s/p IR drainage 2/19. Pharmacy has been consulted for pip/tazo dosing.  Tmax 101 on 2/18, no afeb. WBC wnl. Cultures sent today.    Plan: Zosyn 3.375g IV q8h (4 hour infusion). Monitor cultures, clinical status, renal fx Narrow abx as able and f/u duration    Height: 5\' 6"  (167.6 cm) Weight: 121 lb 7.6 oz (55.1 kg) IBW/kg (Calculated) : 63.8  Temp (24hrs), Avg:99.2 F (37.3 C), Min:98 F (36.7 C), Max:101.2 F (38.4 C)  Recent Labs  Lab 01/21/20 1839 01/22/20 1019 01/23/20 0608 01/24/20 1327 01/28/20 0428  WBC 8.3 8.5 9.1 10.2 9.0  CREATININE 0.90 0.74 0.83  --  0.79    Estimated Creatinine Clearance: 69.8 mL/min (by C-G formula based on SCr of 0.79 mg/dL).    No Known Allergies  Antimicrobials this admission: Pip/tazo 2/19 >>   Microbiology results: 2/19 abscess cx: pend 2/19 Bcx pend  2/12 Covid/flu neg  Benetta Spar, PharmD, BCPS, [facility] Clinical Pharmacist  Please check AMION for all [place] phone numbers After 10:00 PM, call [place] [phone]

## 2020-01-29 DIAGNOSIS — K5900 Constipation, unspecified: Secondary | ICD-10-CM

## 2020-01-29 DIAGNOSIS — E872 Acidosis: Secondary | ICD-10-CM

## 2020-01-29 DIAGNOSIS — E871 Hypo-osmolality and hyponatremia: Secondary | ICD-10-CM

## 2020-01-29 LAB — RENAL FUNCTION PANEL
Albumin: 1.1 g/dL — ABNORMAL LOW (ref 3.5–5.0)
Anion gap: 11 (ref 5–15)
BUN: 24 mg/dL — ABNORMAL HIGH (ref 8–23)
CO2: 21 mmol/L — ABNORMAL LOW (ref 22–32)
Calcium: 7.2 mg/dL — ABNORMAL LOW (ref 8.9–10.3)
Chloride: 100 mmol/L (ref 98–111)
Creatinine, Ser: 1 mg/dL (ref 0.61–1.24)
GFR calc Af Amer: >60 mL/min (ref >60)
GFR calc non Af Amer: >60 mL/min (ref >60)
Glucose, Bld: 121 mg/dL — ABNORMAL HIGH (ref 70–99)
Phosphorus: 4.4 mg/dL (ref 2.5–4.6)
Potassium: 4.5 mmol/L (ref 3.5–5.1)
Sodium: 132 mmol/L — ABNORMAL LOW (ref 135–145)

## 2020-01-29 LAB — CBC
HCT: 25.4 % — ABNORMAL LOW (ref 39.0–52.0)
Hemoglobin: 7.9 g/dL — ABNORMAL LOW (ref 13.0–17.0)
MCH: 25.3 pg — ABNORMAL LOW (ref 26.0–34.0)
MCHC: 31.1 g/dL (ref 30.0–36.0)
MCV: 81.4 fL (ref 80.0–100.0)
Platelets: 477 10*3/uL — ABNORMAL HIGH (ref 150–400)
RBC: 3.12 MIL/uL — ABNORMAL LOW (ref 4.22–5.81)
RDW: 18.6 % — ABNORMAL HIGH (ref 11.5–15.5)
WBC: 8.4 10*3/uL (ref 4.0–10.5)
nRBC: 0 % (ref 0.0–0.2)

## 2020-01-29 LAB — HEPATIC FUNCTION PANEL
ALT: 45 U/L — ABNORMAL HIGH (ref 0–44)
AST: 56 U/L — ABNORMAL HIGH (ref 15–41)
Albumin: 1.1 g/dL — ABNORMAL LOW (ref 3.5–5.0)
Alkaline Phosphatase: 162 U/L — ABNORMAL HIGH (ref 38–126)
Bilirubin, Direct: 0.3 mg/dL — ABNORMAL HIGH (ref 0.0–0.2)
Indirect Bilirubin: 0.5 mg/dL (ref 0.3–0.9)
Total Bilirubin: 0.8 mg/dL (ref 0.3–1.2)
Total Protein: 6 g/dL — ABNORMAL LOW (ref 6.5–8.1)

## 2020-01-29 LAB — MAGNESIUM: Magnesium: 2.5 mg/dL — ABNORMAL HIGH (ref 1.7–2.4)

## 2020-01-29 MED ORDER — SENNOSIDES-DOCUSATE SODIUM 8.6-50 MG PO TABS
1.0000 | ORAL_TABLET | Freq: Two times a day (BID) | ORAL | Status: DC | PRN
  Administered 2020-01-30: 1 via ORAL
  Filled 2020-01-29: qty 1

## 2020-01-29 MED ORDER — POLYETHYLENE GLYCOL 3350 17 G PO PACK
17.0000 g | PACK | Freq: Every day | ORAL | Status: DC | PRN
  Administered 2020-01-30: 17 g via ORAL
  Filled 2020-01-29: qty 1

## 2020-01-29 NOTE — Progress Notes (Signed)
Referring Physician(s): Gonfa,T  Supervising Physician: Daryll Brod  Patient Status:  [facility] - In-pt  Chief Complaint: Abdominal pain, hepatic abscess   Subjective: Pt resting quietly; does note some RUQ soreness at drain site   Allergies: Patient has no known allergies.  Medications: Prior to Admission medications   Medication Sig Start Date End Date Taking? Authorizing Provider  bismuth subsalicylate (PEPTO-BISMOL) 262 MG/15ML suspension Take 30 mLs by mouth 4 (four) times daily -  before meals and at bedtime. 01/28/20   Mercy Riding, MD  feeding supplement, ENSURE ENLIVE, (ENSURE ENLIVE) LIQD Take 237 mLs by mouth 3 (three) times daily between meals. 01/28/20 02/27/20  Mercy Riding, MD  ferrous sulfate 325 (65 FE) MG tablet Take 1 tablet (325 mg total) by mouth 2 (two) times daily with a meal. 01/28/20   Mercy Riding, MD  imatinib (GLEEVEC) 400 MG tablet Take 1 tablet (400 mg total) by mouth daily. Take with meals and large glass of water.Caution:Chemotherapy. 01/27/20   Ladell Pier, MD  metroNIDAZOLE (FLAGYL) 250 MG tablet Take 1 tablet (250 mg total) by mouth 4 (four) times daily for 10 days. 01/28/20 02/07/20  Mercy Riding, MD  Multiple Vitamins-Minerals (MULTIVITAMIN WITH MINERALS) tablet Take 1 tablet by mouth daily. 01/28/20 07/26/20  Mercy Riding, MD  nicotine (NICODERM CQ - DOSED IN MG/24 HOURS) 14 mg/24hr patch Place 1 patch (14 mg total) onto the skin daily. 01/28/20   Mercy Riding, MD  ondansetron (ZOFRAN) 4 MG tablet Take 1 tablet (4 mg total) by mouth daily as needed for nausea or vomiting. 01/28/20 01/27/21  Mercy Riding, MD  pantoprazole (PROTONIX) 40 MG tablet Take 1 tablet (40 mg total) by mouth 2 (two) times daily for 10 days, THEN 1 tablet (40 mg total) daily for 20 days. 01/28/20 02/27/20  Mercy Riding, MD  senna-docusate (SENOKOT S) 8.6-50 MG tablet Take 1 tablet by mouth daily. 01/28/20   Mercy Riding, MD  tetracycline (SUMYCIN) 250 MG capsule Take 1 capsule  (250 mg total) by mouth 4 (four) times daily. 01/28/20   Mercy Riding, MD     Vital Signs: BP 112/64 (BP Location: Right Arm)   Pulse 82   Temp 98.2 F (36.8 C) (Oral)   Resp 20   Ht 5\' 6"  (1.676 m)   Wt 120 lb 2.4 oz (54.5 kg)   SpO2 97%   BMI 19.39 kg/m   Physical Exam awake/alert; RUQ/hepatic drain intact, dressing dry,site mild-mod tender, OP 25 cc blood tinged fluid  Imaging: DG Chest 2 View  Result Date: 01/27/2020 CLINICAL DATA:  Fever EXAM: CHEST - 2 VIEW COMPARISON:  01/22/2020 FINDINGS: The heart size and mediastinal contours are within normal limits. Both lungs are clear. Disc degenerative disease of the thoracic spine. IMPRESSION: No acute abnormality of the lungs. Electronically Signed   By: Eddie Candle M.D.   On: 01/27/2020 13:04   MR LIVER W WO CONTRAST  Result Date: 01/28/2020 CLINICAL DATA:  Inpatient. Weight loss. Low-grade fever. 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 Indeterminate liver mass on recent CT. EXAM: MRI ABDOMEN WITHOUT AND WITH CONTRAST TECHNIQUE: Multiplanar multisequence MR imaging of the abdomen was performed both before and after the administration of intravenous contrast. CONTRAST:  41mL GADAVIST GADOBUTROL 1 MMOL/ML IV SOLN COMPARISON:  01/21/2020 CT abdomen/pelvis. FINDINGS: Lower chest: Trace dependent right pleural effusion. Hepatobiliary: Top-normal liver size. 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 There is a complex multilocular 10.7 x  8.2 cm cystic liver mass in segment 7/8 right liver lobe (series 10/image 9) with restricted diffusion, thickened enhancing wall and thickened irregular internal septations, increased in size from 7.0 x 5.1 cm on 01/21/2020 CT using similar measurement technique. No additional liver lesions. Normal gallbladder with no cholelithiasis. No biliary ductal dilatation. Common bile duct diameter 3 mm. No choledocholithiasis. No biliary strictures, masses or beading. Pancreas: No pancreatic mass or duct  dilation.  No pancreas divisum. Spleen: Normal size spleen. Splenic hemosiderosis. No splenic mass. Adrenals/Urinary Tract: Normal adrenals. No hydronephrosis. Simple 2.5 cm lateral upper left renal cyst. Additional subcentimeter simple lower left renal cyst. No suspicious renal masses. Stomach/Bowel: Large exophytic centrally necrotic 17.3 x 12.3 cm gastric mass arising from posterior proximal stomach centered in the left upper quadrant (series 11/image 21) with thick enhancing wall, previously 17.0 x 11.0 cm on recent CT, not substantially changed. Visualized small and large bowel is normal caliber, with no bowel wall thickening. Vascular/Lymphatic: Normal caliber abdominal aorta. Patent portal, splenic, hepatic and renal veins. No pathologically enlarged lymph nodes in the abdomen. Other: Trace perihepatic ascites.  No focal fluid collection. Musculoskeletal: No aggressive appearing focal osseous lesions. IMPRESSION: 1. Complex multilocular 10.7 x 8.2 cm cystic right liver mass in segment 7/8, increased in size in the short interval since 01/21/2020 CT, indeterminate but with MRI features most suggestive of a liver abscess. Consider IR consultation for consideration of percutaneous drainage. 2. Large exophytic centrally necrotic 17.3 x 12.3 cm enhancing gastric mass arising from the posterior proximal stomach, compatible with biopsy-proven GIST. 3. Trace dependent right pleural effusion.  Trace ascites. 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 Electronically Signed   By: Ilona Sorrel M.D.   On: 01/28/2020 07:38   CT IMAGE GUIDED DRAINAGE BY PERCUTANEOUS CATHETER  Result Date: 01/28/2020 INDICATION: Enlarging hepatic abscess EXAM: CT-guided posterior right hepatic abscess drain MEDICATIONS: The patient is currently admitted to the hospital and receiving intravenous antibiotics. The antibiotics were administered within an appropriate time frame prior to the initiation of the procedure. ANESTHESIA/SEDATION:  Fentanyl 25 mcg IV; Versed 0.5 mg IV Moderate Sedation Time:  10 minutes The patient was continuously monitored during the procedure by the interventional radiology nurse under my direct supervision. COMPLICATIONS: None immediate. PROCEDURE: Informed written consent was obtained from the patient after a thorough discussion of the procedural risks, benefits and alternatives. All questions were addressed. Maximal Sterile Barrier Technique was utilized including caps, mask, sterile gowns, sterile gloves, sterile drape, hand hygiene and skin antiseptic. A timeout was performed prior to the initiation of the procedure. Previous imaging reviewed. Patient position slightly right anterior oblique. Noncontrast localization CT performed. The large posterior right hepatic lobe abscess was localized and marked for a lower intercostal approach in the mid axillary line. Under sterile conditions and local anesthesia, a 18 gauge needle was advanced under CT guidance into the fluid collection through a lower intercostal space. Needle position confirmed with CT. Syringe aspiration yielded purulent fluid. Sample sent for culture. Guidewire inserted followed by tract dilatation to a 12 [place] drain. Drain catheter position confirmed with CT. Syringe aspiration yielded 130 cc total blood tinged purulent fluid. No immediate complication. Patient tolerated the procedure well. Catheter secured with Prolene suture and connected to external suction bulb. Sterile dressing applied. IMPRESSION: Successful CT-guided right posterior hepatic abscess drain placement. Electronically Signed   By: Jerilynn Mages.  Shick M.D.   On: 01/28/2020 14:34    Labs:  CBC: Recent Labs    01/23/20 [id number] 01/23/20  [id number] 01/24/20 1327 01/27/20 0755 01/28/20 0428 01/29/20 0532  WBC 9.1  --  10.2  --  9.0 8.4  HGB 8.8*   < > 8.7* 8.1* 7.7* 7.9*  HCT 28.5*   < > 28.2* 26.0* 24.7* 25.4*  PLT 450*  --  445*  --  392 477*   < > = values in this interval not displayed.      COAGS: Recent Labs    01/28/20 1035  INR 1.4*    BMP: Recent Labs    01/22/20 1019 01/23/20 0608 01/28/20 0428 01/29/20 0532  NA 134* 135 133* 132*  K 3.8 3.6 4.4 4.5  CL 103 101 100 100  CO2 20* 21* 23 21*  GLUCOSE 97 103* 96 121*  BUN 13 14 19  24*  CALCIUM 7.4* 7.6* 7.5* 7.2*  CREATININE 0.74 0.83 0.79 1.00  GFRNONAA >60 >60 >60 >60  GFRAA >60 >60 >60 >60    LIVER FUNCTION TESTS: Recent Labs    01/21/20 1839 01/21/20 1839 01/22/20 1019 01/23/20 0608 01/28/20 0428 01/29/20 0532  BILITOT 0.5  --  1.4* 1.0  --  0.8  AST 55*  --  52* 51*  --  56*  ALT 31  --  29 31  --  45*  ALKPHOS 103  --  88 98  --  162*  PROT 6.3*  --  5.6* 5.5*  --  6.0*  ALBUMIN 1.5*   < > 1.4* 1.2* 1.2* 1.1*  1.1*   < > = values in this interval not displayed.    Assessment and Plan: S/p drainage of enlarging hepatic abscess 2/19; hx GIST; afebrile; WBC nl; hgb 7.9(7.7), creat nl; hepatic fluid cx pend; cont current tx/drain irrigation; once OP <10-15 cc/day for 2-3 days or if clinical status worsens obtain f/u CT   Electronically Signed: D. Rowe Robert, PA-C 01/29/2020, 4:09 PM   I spent a total of 15 minutes at the the patient's bedside AND on the patient's hospital floor or unit, greater than 50% of which was counseling/coordinating care for hepatic abscess drain    Patient ID: Kevin [name], male   DOB: 25-Sep-1951, 68 y.o.   MRN: [id number]

## 2020-01-29 NOTE — Progress Notes (Signed)
PROGRESS NOTE  Kevin [name] [id number] DOB: 1951/09/12   PCP: Patient, No Pcp Per   Patient is from: home  DOA: 01/21/2020 LOS: 7  Brief Narrative / Interim history: 69 year old Spanish male with history of tobacco use disorder presented to ED 2/12 with difficulty eating and unintentional weight loss of about 65 pounds.  Also had melanotic stool.  In ED, CT abdomen and pelvis revealed large necrotic mass in the posterior wall of the stomach and irregular hypodense mass in the right lobe of the liver concerning for metastatic disease.  Hgb 6.2 with normal MCV.   Patient received 2 units of packed red blood cells with appropriate response.  Started on PPI.  GI consulted, and he had EGD and biopsy of the mass on 2/15.  Pathology consistent with gastric stromal tumor.  Also positive for H. pylori infection.  General surgery consulted and recommended neoadjuvant Gleevec prior to resection.  Oncology consulted and started [place], and ordered MRI liver which revealed complex multilocular 10.7 x 8.2 cm cystic right liver mass that has increased in size from his recent CT about a week ago.  IR consulted, and he had drainage of liver abscess and drain placement on 2/19.  He was a started on IV Zosyn after discussion with ID, Dr. Tommy Medal.  Of note, patient had fever on 12/17 and 2/18.  Subjective: No major events overnight of this morning.  No complaints this morning.  Denies chest pain, dyspnea, GI or UTI symptoms.  He says he has not had a bowel movement in the last 3 to 4 days.  JP drain 305cc in the last 24 hours.  Video interpreter with ID number [id number] used for this encounter. Objective: Vitals:   01/28/20 1513 01/28/20 2010 01/29/20 0432 01/29/20 0436  BP: 124/63 104/65  104/68  Pulse: 97 91  85  Resp: 20 20  20   Temp:  100.1 F (37.8 C)  99 F (37.2 C)  TempSrc:  Oral  Oral  SpO2: 97% 95%  94%  Weight:   54.5 kg   Height:        Intake/Output Summary (Last 24 hours) at  01/29/2020 1055 Last data filed at 01/29/2020 0945 Gross per 24 hour  Intake 651.27 ml  Output 930 ml  Net -278.73 ml   Filed Weights   01/27/20 0428 01/28/20 0515 01/29/20 0432  Weight: 53.9 kg 55.1 kg 54.5 kg    Examination:  GENERAL: No acute distress.  Appears well.  HEENT: MMM.  Vision and hearing grossly intact.  NECK: Supple.  No apparent JVD.  RESP:  No IWOB. Good air movement bilaterally. CVS:  RRR. Heart sounds normal.  ABD/GI/GU: Bowel sounds present. Soft. Non tender.  JP drain over right chest MSK/EXT:  Moves extremities. No apparent deformity. No edema.  SKIN: no apparent skin lesion or wound NEURO: Awake, alert and oriented appropriately.  No apparent focal neuro deficit. PSYCH: Calm. Normal affect.  Procedures:  EGD on 2/15 revealed normal esophagus, a large infiltrative mass with no bleeding was found in the posterior wall of the stomach.   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  Pathology confirmed gastric stromal tumor. -General surgery recommends neoadjuvant chemotherapy prior to resection -Oncology started [place] on 2/18  Liver abscess: MRI on 2/19 revealed complex multilocular 10.7 x 8.2 cm cystic liver mass that has increased in size in short interval since the CT on 2/12.   -S/p IR drain and drain  placement on 2/19-culture pending. -Started IV Zosyn after discussion with ID 2/19>> -Follow cultures.  Gastritis/H. pylori infection -Continue PPI -H. pylori treatment-upon discharge  Iron deficiency anemia due to chronic blood loss likely from GI tumor.  Iron sat 7%. -Hgb 6.2 (admit)>2u>> 8.7> 7.7> 7.9 -IV Feraheme on 2/18.  Will give additional IV Feraheme in 3 days.  Tobacco use disorder -Encouraged cessation -Continue nicotine patch  Fever: Likely from liver abscess.  CXR without acute finding.  Fever curve downtrending. -Management as above  Transaminitis: Likely due to liver abscess and  IR procedure. -Continue monitoring  Hyponatremia: Stable. -Continue monitoring  Metabolic acidosis: Improved. -Continue monitoring  Constipation -Bowel regimen  Unintentional weight loss -Appreciate dietitian input-supplements and multivitamins Nutrition Problem: Inadequate oral intake Etiology: altered GI function(large necrotic stomach mass concerning for gastric adenocarcinoma)  Signs/Symptoms: per patient/family report(poor appetite due to early satiety and endorses 65 lb wt loss over the past 3 months)  Interventions: Ensure Enlive (each supplement provides 350kcal and 20 grams of protein), Magic cup, MVI   DVT prophylaxis: SCD Code Status: Full code Family Communication: Patient and/or RN. Available if any question.  Discharge barrier: Liver abscess. Patient with JP drain and on IV antibiotics pending cultures and clearance by consultants. Patient is from: Home Final disposition: Likely home in the next 48 to 72 hours  Consultants: GI (off), general surgery, oncology, IR, ID   Microbiology summarized: [id number] negative. Influenza PCR negative.  Sch Meds:  Scheduled Meds: . sodium chloride   Intravenous Once  . feeding supplement (ENSURE ENLIVE)  237 mL Oral TID BM  . ferrous sulfate  325 mg Oral BID WC  . imatinib  400 mg Oral Q breakfast  . influenza vaccine adjuvanted  0.5 mL Intramuscular Tomorrow-1000  . multivitamin with minerals  1 tablet Oral Daily  . nicotine  14 mg Transdermal Daily  . pantoprazole  40 mg Oral BID  . pneumococcal 23 valent vaccine  0.5 mL Intramuscular Tomorrow-1000  . sodium chloride flush  5 mL Intracatheter Q8H   Continuous Infusions: . sodium chloride 250 mL (01/28/20 1343)  . piperacillin-tazobactam (ZOSYN)  IV 3.375 g (01/29/20 0518)   PRN Meds:.sodium chloride, acetaminophen **OR** acetaminophen, morphine injection, ondansetron (ZOFRAN) IV, polyethylene glycol, senna-docusate  Antimicrobials: Anti-infectives (From  admission, onward)   Start     Dose/Rate Route Frequency Ordered Stop   01/28/20 1430  piperacillin-tazobactam (ZOSYN) IVPB 3.375 g     3.375 g 12.5 mL/hr over 240 Minutes Intravenous Every 8 hours 01/28/20 1429     01/28/20 0000  metroNIDAZOLE (FLAGYL) 250 MG tablet     250 mg Oral 4 times daily 01/28/20 0716 02/07/20 2359   01/28/20 0000  tetracycline (SUMYCIN) 250 MG capsule     250 mg Oral 4 times daily 01/28/20 0716         I have personally reviewed the following labs and images: CBC: Recent Labs  Lab 01/23/20 0608 01/24/20 1327 01/27/20 0755 01/28/20 0428 01/29/20 0532  WBC 9.1 10.2  --  9.0 8.4  HGB 8.8* 8.7* 8.1* 7.7* 7.9*  HCT 28.5* 28.2* 26.0* 24.7* 25.4*  MCV 81.9 82.0  --  80.7 81.4  PLT 450* 445*  --  392 477*   BMP &GFR Recent Labs  Lab 01/23/20 0608 01/28/20 0428 01/29/20 0532  NA 135 133* 132*  K 3.6 4.4 4.5  CL 101 100 100  CO2 21* 23 21*  GLUCOSE 103* 96 121*  BUN 14 19 24*  CREATININE 0.83 0.79  1.00  CALCIUM 7.6* 7.5* 7.2*  MG  --  2.4 2.5*  PHOS  --  4.1 4.4   Estimated Creatinine Clearance: 55.3 mL/min (by C-G formula based on SCr of 1 mg/dL). Liver & Pancreas: Recent Labs  Lab 01/23/20 0608 01/28/20 0428 01/29/20 0532  AST 51*  --  56*  ALT 31  --  45*  ALKPHOS 98  --  162*  BILITOT 1.0  --  0.8  PROT 5.5*  --  6.0*  ALBUMIN 1.2* 1.2* 1.1*  1.1*   No results for input(s): LIPASE, AMYLASE in the last 168 hours. No results for input(s): AMMONIA in the last 168 hours. Diabetic: No results for input(s): HGBA1C in the last 72 hours. No results for input(s): GLUCAP in the last 168 hours. Cardiac Enzymes: No results for input(s): CKTOTAL, CKMB, CKMBINDEX, TROPONINI in the last 168 hours. No results for input(s): PROBNP in the last 8760 hours. Coagulation Profile: Recent Labs  Lab 01/28/20 1035  INR 1.4*   Thyroid Function Tests: No results for input(s): TSH, T4TOTAL, FREET4, T3FREE, THYROIDAB in the last 72 hours. Lipid  Profile: No results for input(s): CHOL, HDL, LDLCALC, TRIG, CHOLHDL, LDLDIRECT in the last 72 hours. Anemia Panel: Recent Labs    01/26/20 1426  VITAMINB12 1,177*  FOLATE 16.1  FERRITIN 507*  TIBC 143*  IRON 10*  RETICCTPCT 1.0   Urine analysis:    Component Value Date/Time   COLORURINE YELLOW 01/22/2020 0418   APPEARANCEUR CLEAR 01/22/2020 0418   LABSPEC 1.040 (H) 01/22/2020 0418   PHURINE 5.0 01/22/2020 0418   GLUCOSEU NEGATIVE 01/22/2020 0418   HGBUR NEGATIVE 01/22/2020 0418   BILIRUBINUR NEGATIVE 01/22/2020 0418   KETONESUR NEGATIVE 01/22/2020 0418   PROTEINUR NEGATIVE 01/22/2020 0418   NITRITE NEGATIVE 01/22/2020 0418   LEUKOCYTESUR NEGATIVE 01/22/2020 0418   Sepsis Labs: Invalid input(s): PROCALCITONIN, [place]  Microbiology: Recent Results (from the past 240 hour(s))  Respiratory Panel by RT PCR (Flu A&B, Covid) - Nasopharyngeal Swab     Status: None   Collection Time: 01/21/20 11:20 PM   Specimen: Nasopharyngeal Swab  Result Value Ref Range Status   SARS Coronavirus 2 by RT PCR NEGATIVE NEGATIVE Final    Comment: (NOTE) SARS-CoV-2 target nucleic acids are NOT DETECTED. The SARS-CoV-2 RNA is generally detectable in upper respiratoy specimens during the acute phase of infection. The lowest concentration of SARS-CoV-2 viral copies this assay can detect is 131 copies/mL. A negative result does not preclude SARS-Cov-2 infection and should not be used as the sole basis for treatment or other patient management decisions. A negative result may occur with  improper specimen collection/handling, submission of specimen other than nasopharyngeal swab, presence of viral mutation(s) within the areas targeted by this assay, and inadequate number of viral copies (<131 copies/mL). A negative result must be combined with clinical observations, patient history, and epidemiological information. The expected result is Negative. Fact Sheet for Patients:    PinkCheek.be Fact Sheet for Healthcare Providers:  GravelBags.it This test is not yet ap proved or cleared by the [place] FDA and  has been authorized for detection and/or diagnosis of SARS-CoV-2 by FDA under an Emergency Use Authorization (EUA). This EUA will remain  in effect (meaning this test can be used) for the duration of the COVID-19 declaration under Section 564(b)(1) of the Act, 21 U.S.C. section 360bbb-3(b)(1), unless the authorization is terminated or revoked sooner.    Influenza A by PCR NEGATIVE NEGATIVE Final   Influenza B by PCR NEGATIVE  NEGATIVE Final    Comment: (NOTE) The Xpert Xpress SARS-CoV-2/FLU/RSV assay is intended as an aid in  the diagnosis of influenza from Nasopharyngeal swab specimens and  should not be used as a sole basis for treatment. Nasal washings and  aspirates are unacceptable for Xpert Xpress SARS-CoV-2/FLU/RSV  testing. Fact Sheet for Patients: PinkCheek.be Fact Sheet for Healthcare Providers: GravelBags.it This test is not yet approved or cleared by the [place] FDA and  has been authorized for detection and/or diagnosis of SARS-CoV-2 by  FDA under an Emergency Use Authorization (EUA). This EUA will remain  in effect (meaning this test can be used) for the duration of the  Covid-19 declaration under Section 564(b)(1) of the Act, 21  U.S.C. section 360bbb-3(b)(1), unless the authorization is  terminated or revoked. Performed at [facility], [place] [address] [place], [place] [address]   Aerobic/Anaerobic Culture (surgical/deep wound)     Status: None (Preliminary result)   Collection Time: 01/28/20 12:55 PM   Specimen: Abscess  Result Value Ref Range Status   Specimen Description ABSCESS LIVER  Final   Special Requests Normal  Final   Gram Stain   Final    ABUNDANT WBC PRESENT,BOTH PMN AND  MONONUCLEAR ABUNDANT GRAM POSITIVE COCCI Performed at [name], [address] [place], [place] [address]    Culture PENDING  Incomplete   Report Status PENDING  Incomplete  Culture, blood (routine x 2)     Status: None (Preliminary result)   Collection Time: 01/28/20  6:02 PM   Specimen: BLOOD  Result Value Ref Range Status   Specimen Description BLOOD RIGHT ANTECUBITAL  Final   Special Requests   Final    BOTTLES DRAWN AEROBIC AND ANAEROBIC Blood Culture adequate volume   Culture NO GROWTH < 12 HOURS  Final   Report Status PENDING  Incomplete  Culture, blood (routine x 2)     Status: None (Preliminary result)   Collection Time: 01/28/20  6:17 PM   Specimen: BLOOD RIGHT HAND  Result Value Ref Range Status   Specimen Description BLOOD RIGHT HAND  Final   Special Requests   Final    BOTTLES DRAWN AEROBIC ONLY Blood Culture adequate volume   Culture NO GROWTH < 12 HOURS  Final   Report Status PENDING  Incomplete    Radiology Studies: CT IMAGE GUIDED DRAINAGE BY PERCUTANEOUS CATHETER  Result Date: 01/28/2020 INDICATION: Enlarging hepatic abscess EXAM: CT-guided posterior right hepatic abscess drain MEDICATIONS: The patient is currently admitted to the hospital and receiving intravenous antibiotics. The antibiotics were administered within an appropriate time frame prior to the initiation of the procedure. ANESTHESIA/SEDATION: Fentanyl 25 mcg IV; Versed 0.5 mg IV Moderate Sedation Time:  10 minutes The patient was continuously monitored during the procedure by the interventional radiology nurse under my direct supervision. COMPLICATIONS: None immediate. PROCEDURE: Informed written consent was obtained from the patient after a thorough discussion of the procedural risks, benefits and alternatives. All questions were addressed. Maximal Sterile Barrier Technique was utilized including caps, mask, sterile gowns, sterile gloves, sterile drape, hand hygiene and skin antiseptic. A  timeout was performed prior to the initiation of the procedure. Previous imaging reviewed. Patient position slightly right anterior oblique. Noncontrast localization CT performed. The large posterior right hepatic lobe abscess was localized and marked for a lower intercostal approach in the mid axillary line. Under sterile conditions and local anesthesia, a 18 gauge needle was advanced under CT guidance into the fluid collection through a  lower intercostal space. Needle position confirmed with CT. Syringe aspiration yielded purulent fluid. Sample sent for culture. Guidewire inserted followed by tract dilatation to a 12 [place] drain. Drain catheter position confirmed with CT. Syringe aspiration yielded 130 cc total blood tinged purulent fluid. No immediate complication. Patient tolerated the procedure well. Catheter secured with Prolene suture and connected to external suction bulb. Sterile dressing applied. IMPRESSION: Successful CT-guided right posterior hepatic abscess drain placement. Electronically Signed   By: Jerilynn Mages.  Shick M.D.   On: 01/28/2020 14:34     Yaniv Lage T. [place]  If 7PM-7AM, please contact night-coverage [email or web address] Password [facility] 01/29/2020, 10:55 AM

## 2020-01-30 DIAGNOSIS — E440 Moderate protein-calorie malnutrition: Secondary | ICD-10-CM

## 2020-01-30 DIAGNOSIS — E8809 Other disorders of plasma-protein metabolism, not elsewhere classified: Secondary | ICD-10-CM

## 2020-01-30 LAB — RENAL FUNCTION PANEL
Albumin: <1 g/dL — ABNORMAL LOW (ref 3.5–5.0)
Anion gap: 8 (ref 5–15)
BUN: 18 mg/dL (ref 8–23)
CO2: 24 mmol/L (ref 22–32)
Calcium: 7.1 mg/dL — ABNORMAL LOW (ref 8.9–10.3)
Chloride: 101 mmol/L (ref 98–111)
Creatinine, Ser: 1 mg/dL (ref 0.61–1.24)
GFR calc Af Amer: >60 mL/min (ref >60)
GFR calc non Af Amer: >60 mL/min (ref >60)
Glucose, Bld: 127 mg/dL — ABNORMAL HIGH (ref 70–99)
Phosphorus: 3.8 mg/dL (ref 2.5–4.6)
Potassium: 4.1 mmol/L (ref 3.5–5.1)
Sodium: 133 mmol/L — ABNORMAL LOW (ref 135–145)

## 2020-01-30 LAB — MAGNESIUM: Magnesium: 2.5 mg/dL — ABNORMAL HIGH (ref 1.7–2.4)

## 2020-01-30 LAB — CBC
HCT: 25.4 % — ABNORMAL LOW (ref 39.0–52.0)
Hemoglobin: 7.6 g/dL — ABNORMAL LOW (ref 13.0–17.0)
MCH: 24.8 pg — ABNORMAL LOW (ref 26.0–34.0)
MCHC: 29.9 g/dL — ABNORMAL LOW (ref 30.0–36.0)
MCV: 82.7 fL (ref 80.0–100.0)
Platelets: 469 10*3/uL — ABNORMAL HIGH (ref 150–400)
RBC: 3.07 MIL/uL — ABNORMAL LOW (ref 4.22–5.81)
RDW: 18.6 % — ABNORMAL HIGH (ref 11.5–15.5)
WBC: 8.9 10*3/uL (ref 4.0–10.5)
nRBC: 0 % (ref 0.0–0.2)

## 2020-01-30 MED ORDER — PENICILLIN G POTASSIUM 20000000 UNITS IJ SOLR
4.0000 10*6.[IU] | INTRAVENOUS | Status: DC
  Administered 2020-01-30 – 2020-02-01 (×10): 4 10*6.[IU] via INTRAVENOUS
  Filled 2020-01-30 (×15): qty 4

## 2020-01-30 MED ORDER — METOCLOPRAMIDE HCL 5 MG/ML IJ SOLN
10.0000 mg | Freq: Once | INTRAMUSCULAR | Status: AC
  Administered 2020-01-30: 23:00:00 10 mg via INTRAVENOUS
  Filled 2020-01-30: qty 2

## 2020-01-30 MED ORDER — SODIUM CHLORIDE 0.9 % IV SOLN
510.0000 mg | Freq: Once | INTRAVENOUS | Status: AC
  Administered 2020-01-30: 510 mg via INTRAVENOUS
  Filled 2020-01-30: qty 17

## 2020-01-30 NOTE — Plan of Care (Signed)
  Problem: Health Behavior/Discharge Planning: Goal: Ability to manage health-related needs will improve Outcome: Progressing   Problem: Clinical Measurements: Goal: Ability to maintain clinical measurements within normal limits will improve Outcome: Progressing   Problem: Nutrition: Goal: Adequate nutrition will be maintained Outcome: Progressing   

## 2020-01-30 NOTE — Progress Notes (Signed)
PROGRESS NOTE  Kevin [name] [id number] DOB: Sep 20, 1951   PCP: Kevin [name], No Pcp Per   Kevin [name] is from: home  DOA: 01/21/2020 LOS: 8  Brief Narrative / Interim history: 69 year old Spanish male with history of tobacco use disorder presented to ED 2/12 with difficulty eating and unintentional weight loss of about 65 pounds.  Also had melanotic stool.  In ED, CT abdomen and pelvis revealed large necrotic mass in the posterior wall of the stomach and irregular hypodense mass in the right lobe of the liver concerning for metastatic disease.  Hgb 6.2 with normal MCV.   Kevin [name] received 2 units of packed red blood cells with appropriate response.  Started on PPI.  GI consulted, and he had EGD and biopsy of the mass on 2/15.  Pathology consistent with gastric stromal tumor.  Also positive for H. pylori infection.  General surgery consulted and recommended neoadjuvant Gleevec prior to resection.  Oncology consulted and started [place], and ordered MRI liver which revealed complex multilocular 10.7 x 8.2 cm cystic right liver mass that has increased in size from his recent CT about a week ago.  IR consulted, and he had drainage of liver abscess and drain placement on 2/19.  He was a started on IV Zosyn after discussion with ID, Dr. Tommy Medal.  Of note, Kevin [name] had fever on 12/17 and 2/18.   Subjective: No major events overnight of this morning.  No complaints.  He denies chest pain, dyspnea, nausea, vomiting, abdominal pain or diarrhea.  He does not have a good appetite.  He also has not had a bowel movement yet.  100 cc from JP drain the last 24 hours.  Video interpreter with ID number [id number] used for this encounter. Objective: Vitals:   01/29/20 1156 01/29/20 2056 01/30/20 0446 01/30/20 0850  BP: 112/64 112/67 97/64 (!) 96/58  Pulse: 82 95 80 83  Resp: 20 16 18    Temp: 98.2 F (36.8 C) 99.3 F (37.4 C) 98 F (36.7 C)   TempSrc: Oral Oral Oral   SpO2: 97% 94% 98%   Weight:   54.6 kg     Height:        Intake/Output Summary (Last 24 hours) at 01/30/2020 1036 Last data filed at 01/30/2020 0858 Gross per 24 hour  Intake 610 ml  Output 600 ml  Net 10 ml   Filed Weights   01/28/20 0515 01/29/20 0432 01/30/20 0446  Weight: 55.1 kg 54.5 kg 54.6 kg    Examination:  GENERAL: No acute distress.  Appears well.  HEENT: MMM.  Vision and hearing grossly intact.  NECK: Supple.  No apparent JVD.  RESP:  No IWOB. Good air movement bilaterally. CVS:  RRR. Heart sounds normal.  ABD/GI/GU: Bowel sounds present. Soft. Non tender.  JP drain over right chest. MSK/EXT:  Moves extremities. No apparent deformity. No edema.  SKIN: no apparent skin lesion or wound NEURO: Awake, alert and oriented appropriately.  No apparent focal neuro deficit. PSYCH: Calm. Normal affect.   Procedures:  EGD on 2/15 revealed normal esophagus, a large infiltrative mass with no bleeding was found in the posterior wall of the stomach.   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  Pathology confirmed gastric stromal tumor. -General surgery recommends neoadjuvant chemotherapy prior to resection -Oncology started [place] on 2/18  Liver abscess: MRI on 2/19 revealed complex multilocular 10.7 x 8.2 cm cystic liver mass that has increased in size in short interval since the  CT on 2/12.   -S/p IR drain and drain placement on 2/19-culture pending. -Started IV Zosyn after discussion with ID 2/19>> -Follow cultures.  Gastritis/H. pylori infection -Continue PPI -H. pylori treatment-upon discharge  Iron deficiency anemia due to chronic blood loss likely from GI tumor.  Iron sat 7%. -Hgb 6.2 (admit)>2u>> 8.7> 7.7> 7.9 -IV Feraheme on 2/18.  Will give additional IV Feraheme in 3 days.  Tobacco use disorder -Encouraged cessation -Continue nicotine patch  Fever: Likely from liver abscess.  CXR without acute finding.  Last fever on 2/18. -Management as  above  Transaminitis: Likely due to liver abscess and IR procedure. -Continue monitoring  Hyponatremia: Stable. -Continue monitoring  Metabolic acidosis: Improved. -Continue monitoring  Constipation: Has not had bowel movement yet. -I requested RN to administer bowel regimens.  If no BM today, will try mag citrate or enema.  Hypoalbuminemia: Likely due to malnutrition and poor p.o. intake in the setting of illness. -Intervention as below.  Moderate malnutrition/unintentional weight loss due to poor p.o. intake and GIST. -Appreciate dietitian input-noted that he has not been drinking his Ensure. Nutrition Problem: Inadequate oral intake Etiology: altered GI function(large necrotic stomach mass concerning for gastric adenocarcinoma)  Signs/Symptoms: per Kevin [name]/family report(poor appetite due to early satiety and endorses 65 lb wt loss over the past 3 months)  Interventions: Ensure Enlive (each supplement provides 350kcal and 20 grams of protein), Magic cup, MVI   DVT prophylaxis: SCD Code Status: Full code Family Communication: Kevin [name] and/or RN. Available if any question.  Discharge barrier: Liver abscess. Kevin [name] with JP drain and on IV antibiotics pending cultures and clearance by consultants. Kevin [name] is from: Home Final disposition: Likely home in the next 48 to 72 hours  Consultants: GI (off), general surgery, oncology, IR, ID   Microbiology summarized: [id number] negative. Influenza PCR negative.  Sch Meds:  Scheduled Meds: . sodium chloride   Intravenous Once  . feeding supplement (ENSURE ENLIVE)  237 mL Oral TID BM  . ferrous sulfate  325 mg Oral BID WC  . imatinib  400 mg Oral Q breakfast  . influenza vaccine adjuvanted  0.5 mL Intramuscular Tomorrow-1000  . multivitamin with minerals  1 tablet Oral Daily  . nicotine  14 mg Transdermal Daily  . pantoprazole  40 mg Oral BID  . pneumococcal 23 valent vaccine  0.5 mL Intramuscular Tomorrow-1000  . sodium  chloride flush  5 mL Intracatheter Q8H   Continuous Infusions: . sodium chloride 250 mL (01/28/20 1343)  . piperacillin-tazobactam (ZOSYN)  IV 3.375 g (01/30/20 0536)   PRN Meds:.sodium chloride, acetaminophen **OR** acetaminophen, morphine injection, ondansetron (ZOFRAN) IV, polyethylene glycol, senna-docusate  Antimicrobials: Anti-infectives (From admission, onward)   Start     Dose/Rate Route Frequency Ordered Stop   01/28/20 1430  piperacillin-tazobactam (ZOSYN) IVPB 3.375 g     3.375 g 12.5 mL/hr over 240 Minutes Intravenous Every 8 hours 01/28/20 1429     01/28/20 0000  metroNIDAZOLE (FLAGYL) 250 MG tablet     250 mg Oral 4 times daily 01/28/20 0716 02/07/20 2359   01/28/20 0000  tetracycline (SUMYCIN) 250 MG capsule     250 mg Oral 4 times daily 01/28/20 0716         I have personally reviewed the following labs and images: CBC: Recent Labs  Lab 01/24/20 1327 01/27/20 0755 01/28/20 0428 01/29/20 0532 01/30/20 0400  WBC 10.2  --  9.0 8.4 8.9  HGB 8.7* 8.1* 7.7* 7.9* 7.6*  HCT 28.2* 26.0* 24.7* 25.4* 25.4*  MCV 82.0  --  80.7 81.4 82.7  PLT 445*  --  392 477* 469*   BMP &GFR Recent Labs  Lab 01/28/20 0428 01/29/20 0532 01/30/20 0400  NA 133* 132* 133*  K 4.4 4.5 4.1  CL 100 100 101  CO2 23 21* 24  GLUCOSE 96 121* 127*  BUN 19 24* 18  CREATININE 0.79 1.00 1.00  CALCIUM 7.5* 7.2* 7.1*  MG 2.4 2.5* 2.5*  PHOS 4.1 4.4 3.8   Estimated Creatinine Clearance: 55.4 mL/min (by C-G formula based on SCr of 1 mg/dL). Liver & Pancreas: Recent Labs  Lab 01/28/20 0428 01/29/20 0532 01/30/20 0400  AST  --  56*  --   ALT  --  45*  --   ALKPHOS  --  162*  --   BILITOT  --  0.8  --   PROT  --  6.0*  --   ALBUMIN 1.2* 1.1*  1.1* <1.0*   No results for input(s): LIPASE, AMYLASE in the last 168 hours. No results for input(s): AMMONIA in the last 168 hours. Diabetic: No results for input(s): HGBA1C in the last 72 hours. No results for input(s): GLUCAP in the last  168 hours. Cardiac Enzymes: No results for input(s): CKTOTAL, CKMB, CKMBINDEX, TROPONINI in the last 168 hours. No results for input(s): PROBNP in the last 8760 hours. Coagulation Profile: Recent Labs  Lab 01/28/20 1035  INR 1.4*   Thyroid Function Tests: No results for input(s): TSH, T4TOTAL, FREET4, T3FREE, THYROIDAB in the last 72 hours. Lipid Profile: No results for input(s): CHOL, HDL, LDLCALC, TRIG, CHOLHDL, LDLDIRECT in the last 72 hours. Anemia Panel: No results for input(s): VITAMINB12, FOLATE, FERRITIN, TIBC, IRON, RETICCTPCT in the last 72 hours. Urine analysis:    Component Value Date/Time   COLORURINE YELLOW 01/22/2020 0418   APPEARANCEUR CLEAR 01/22/2020 0418   LABSPEC 1.040 (H) 01/22/2020 0418   PHURINE 5.0 01/22/2020 0418   GLUCOSEU NEGATIVE 01/22/2020 0418   HGBUR NEGATIVE 01/22/2020 0418   BILIRUBINUR NEGATIVE 01/22/2020 0418   KETONESUR NEGATIVE 01/22/2020 0418   PROTEINUR NEGATIVE 01/22/2020 0418   NITRITE NEGATIVE 01/22/2020 0418   LEUKOCYTESUR NEGATIVE 01/22/2020 0418   Sepsis Labs: Invalid input(s): PROCALCITONIN, [place]  Microbiology: Recent Results (from the past 240 hour(s))  Respiratory Panel by RT PCR (Flu A&B, Covid) - Nasopharyngeal Swab     Status: None   Collection Time: 01/21/20 11:20 PM   Specimen: Nasopharyngeal Swab  Result Value Ref Range Status   SARS Coronavirus 2 by RT PCR NEGATIVE NEGATIVE Final    Comment: (NOTE) SARS-CoV-2 target nucleic acids are NOT DETECTED. The SARS-CoV-2 RNA is generally detectable in upper respiratoy specimens during the acute phase of infection. The lowest concentration of SARS-CoV-2 viral copies this assay can detect is 131 copies/mL. A negative result does not preclude SARS-Cov-2 infection and should not be used as the sole basis for treatment or other Kevin [name] management decisions. A negative result may occur with  improper specimen collection/handling, submission of specimen other than  nasopharyngeal swab, presence of viral mutation(s) within the areas targeted by this assay, and inadequate number of viral copies (<131 copies/mL). A negative result must be combined with clinical observations, Kevin [name] history, and epidemiological information. The expected result is Negative. Fact Sheet for Patients:  PinkCheek.be Fact Sheet for Healthcare Providers:  GravelBags.it This test is not yet ap proved or cleared by the [place] FDA and  has been authorized for detection and/or diagnosis of SARS-CoV-2 by FDA under an Emergency  Use Authorization (EUA). This EUA will remain  in effect (meaning this test can be used) for the duration of the COVID-19 declaration under Section 564(b)(1) of the Act, 21 U.S.C. section 360bbb-3(b)(1), unless the authorization is terminated or revoked sooner.    Influenza A by PCR NEGATIVE NEGATIVE Final   Influenza B by PCR NEGATIVE NEGATIVE Final    Comment: (NOTE) The Xpert Xpress SARS-CoV-2/FLU/RSV assay is intended as an aid in  the diagnosis of influenza from Nasopharyngeal swab specimens and  should not be used as a sole basis for treatment. Nasal washings and  aspirates are unacceptable for Xpert Xpress SARS-CoV-2/FLU/RSV  testing. Fact Sheet for Patients: PinkCheek.be Fact Sheet for Healthcare Providers: GravelBags.it This test is not yet approved or cleared by the [place] FDA and  has been authorized for detection and/or diagnosis of SARS-CoV-2 by  FDA under an Emergency Use Authorization (EUA). This EUA will remain  in effect (meaning this test can be used) for the duration of the  Covid-19 declaration under Section 564(b)(1) of the Act, 21  U.S.C. section 360bbb-3(b)(1), unless the authorization is  terminated or revoked. Performed at [facility], [place] [address], [place], [place] [address]    Aerobic/Anaerobic Culture (surgical/deep wound)     Status: None (Preliminary result)   Collection Time: 01/28/20 12:55 PM   Specimen: Abscess  Result Value Ref Range Status   Specimen Description ABSCESS LIVER  Final   Special Requests Normal  Final   Gram Stain   Final    ABUNDANT WBC PRESENT,BOTH PMN AND MONONUCLEAR ABUNDANT GRAM POSITIVE COCCI    Culture   Final    CULTURE REINCUBATED FOR BETTER GROWTH Performed at [facility], [place] [address] [place], [place] [address]    Report Status PENDING  Incomplete  Culture, blood (routine x 2)     Status: None (Preliminary result)   Collection Time: 01/28/20  6:02 PM   Specimen: BLOOD  Result Value Ref Range Status   Specimen Description BLOOD RIGHT ANTECUBITAL  Final   Special Requests   Final    BOTTLES DRAWN AEROBIC AND ANAEROBIC Blood Culture adequate volume   Culture NO GROWTH < 12 HOURS  Final   Report Status PENDING  Incomplete  Culture, blood (routine x 2)     Status: None (Preliminary result)   Collection Time: 01/28/20  6:17 PM   Specimen: BLOOD RIGHT HAND  Result Value Ref Range Status   Specimen Description BLOOD RIGHT HAND  Final   Special Requests   Final    BOTTLES DRAWN AEROBIC ONLY Blood Culture adequate volume   Culture NO GROWTH < 12 HOURS  Final   Report Status PENDING  Incomplete    Radiology Studies: No results found.   Wyndell Cardiff T. Hastings  If 7PM-7AM, please contact night-coverage [email or web address] Password [email or web address] 01/30/2020, 10:36 AM

## 2020-01-31 DIAGNOSIS — E43 Unspecified severe protein-calorie malnutrition: Secondary | ICD-10-CM | POA: Insufficient documentation

## 2020-01-31 LAB — CBC
HCT: 26.3 % — ABNORMAL LOW (ref 39.0–52.0)
Hemoglobin: 7.9 g/dL — ABNORMAL LOW (ref 13.0–17.0)
MCH: 25.1 pg — ABNORMAL LOW (ref 26.0–34.0)
MCHC: 30 g/dL (ref 30.0–36.0)
MCV: 83.5 fL (ref 80.0–100.0)
Platelets: 599 10*3/uL — ABNORMAL HIGH (ref 150–400)
RBC: 3.15 MIL/uL — ABNORMAL LOW (ref 4.22–5.81)
RDW: 19.1 % — ABNORMAL HIGH (ref 11.5–15.5)
WBC: 11.3 10*3/uL — ABNORMAL HIGH (ref 4.0–10.5)
nRBC: 0 % (ref 0.0–0.2)

## 2020-01-31 LAB — RENAL FUNCTION PANEL
Albumin: 1 g/dL — ABNORMAL LOW (ref 3.5–5.0)
Anion gap: 12 (ref 5–15)
BUN: 13 mg/dL (ref 8–23)
CO2: 22 mmol/L (ref 22–32)
Calcium: 7.1 mg/dL — ABNORMAL LOW (ref 8.9–10.3)
Chloride: 99 mmol/L (ref 98–111)
Creatinine, Ser: 1.03 mg/dL (ref 0.61–1.24)
GFR calc Af Amer: >60 mL/min (ref >60)
GFR calc non Af Amer: >60 mL/min (ref >60)
Glucose, Bld: 125 mg/dL — ABNORMAL HIGH (ref 70–99)
Phosphorus: 3.1 mg/dL (ref 2.5–4.6)
Potassium: 4.1 mmol/L (ref 3.5–5.1)
Sodium: 133 mmol/L — ABNORMAL LOW (ref 135–145)

## 2020-01-31 LAB — HEPATITIS PANEL, ACUTE
HCV Ab: NONREACTIVE
Hep A IgM: NONREACTIVE
Hep B C IgM: NONREACTIVE
Hepatitis B Surface Ag: NONREACTIVE

## 2020-01-31 LAB — HEPATIC FUNCTION PANEL
ALT: 50 U/L — ABNORMAL HIGH (ref 0–44)
AST: 74 U/L — ABNORMAL HIGH (ref 15–41)
Albumin: 1 g/dL — ABNORMAL LOW (ref 3.5–5.0)
Alkaline Phosphatase: 187 U/L — ABNORMAL HIGH (ref 38–126)
Bilirubin, Direct: 0.2 mg/dL (ref 0.0–0.2)
Indirect Bilirubin: 0.4 mg/dL (ref 0.3–0.9)
Total Bilirubin: 0.6 mg/dL (ref 0.3–1.2)
Total Protein: 5.9 g/dL — ABNORMAL LOW (ref 6.5–8.1)

## 2020-01-31 LAB — MAGNESIUM: Magnesium: 2.4 mg/dL (ref 1.7–2.4)

## 2020-01-31 MED ORDER — MIRTAZAPINE 15 MG PO TABS
7.5000 mg | ORAL_TABLET | Freq: Every day | ORAL | Status: DC
  Administered 2020-01-31 – 2020-02-02 (×3): 7.5 mg via ORAL
  Filled 2020-01-31 (×3): qty 1

## 2020-01-31 MED ORDER — PRO-STAT SUGAR FREE PO LIQD
30.0000 mL | Freq: Two times a day (BID) | ORAL | Status: DC
  Administered 2020-01-31 – 2020-02-02 (×5): 30 mL via ORAL
  Filled 2020-01-31 (×5): qty 30

## 2020-01-31 MED ORDER — BOOST / RESOURCE BREEZE PO LIQD CUSTOM
1.0000 | Freq: Three times a day (TID) | ORAL | Status: DC
  Administered 2020-01-31 – 2020-02-02 (×6): 1 via ORAL

## 2020-01-31 MED ORDER — SACCHAROMYCES BOULARDII 250 MG PO CAPS
250.0000 mg | ORAL_CAPSULE | Freq: Two times a day (BID) | ORAL | Status: DC
  Administered 2020-01-31 – 2020-02-03 (×7): 250 mg via ORAL
  Filled 2020-01-31 (×7): qty 1

## 2020-01-31 NOTE — Final Consult Note (Signed)
Consultant Final Sign-Off Note    Assessment/Final recommendations  Kevin [name] is a 69 y.o. male followed by me for large GIST tumor w/ anemia   Hgb stable 7.7 >7.9 > 7.6 > 7.9  Oncology has started on [place]. Per prior notes, will have follow up outpatient to discuss resection.   Wound care (if applicable):    Diet at discharge: per primary team   Activity at discharge: per primary team   Follow-up appointment: Dr. Barry Dienes    Pending results:  Unresulted Labs (From admission, onward)    Start     Ordered   01/28/20 0500  Renal function panel  Daily,   R    Question:  Specimen collection method  Answer:  Lab=Lab collect   01/27/20 1621   01/28/20 0500  Magnesium  Daily,   R    Question:  Specimen collection method  Answer:  Lab=Lab collect   01/27/20 1621   01/28/20 0500  CBC  Daily,   R    Question:  Specimen collection method  Answer:  Lab=Lab collect   01/27/20 1621           Medication recommendations:    Other recommendations:    Thank you for allowing [place] to participate in the care of your patient!  Please consult [place] again if you have further needs for your patient.  Barth [facility] 01/31/2020 9:56 AM    Subjective   Interpreter used. Doing okay today. Eating some. No abdominal pain. No bloody BM's.   Objective  Vital signs in last 24 hours: Temp:  [98.2 F (36.8 C)-99.3 F (37.4 C)] 98.6 F (37 C) (02/22 0425) Pulse Rate:  [85-98] 85 (02/22 0815) Resp:  [16-20] 18 (02/22 0815) BP: (101-118)/(60-77) 101/60 (02/22 0815) SpO2:  [94 %-99 %] 96 % (02/22 0815) Weight:  [54.4 kg] 54.4 kg (02/22 0900)  Gen: Awake and alert Abd: Soft, ND, NT, +BS. IR drain in place with bloody and purulent drainage.   Pertinent labs and Studies: Recent Labs    01/29/20 0532 01/30/20 0400 01/31/20 0323  WBC 8.4 8.9 11.3*  HGB 7.9* 7.6* 7.9*  HCT 25.4* 25.4* 26.3*   BMET Recent Labs    01/30/20 0400 01/31/20 0323  NA 133* 133*  K 4.1 4.1  CL 101 99   CO2 24 22  GLUCOSE 127* 125*  BUN 18 13  CREATININE 1.00 1.03  CALCIUM 7.1* 7.1*   No results for input(s): LABURIN in the last 72 hours. Results for orders placed or performed during the hospital encounter of 01/21/20  Respiratory Panel by RT PCR (Flu A&B, Covid) - Nasopharyngeal Swab     Status: None   Collection Time: 01/21/20 11:20 PM   Specimen: Nasopharyngeal Swab  Result Value Ref Range Status   SARS Coronavirus 2 by RT PCR NEGATIVE NEGATIVE Final    Comment: (NOTE) SARS-CoV-2 target nucleic acids are NOT DETECTED. The SARS-CoV-2 RNA is generally detectable in upper respiratoy specimens during the acute phase of infection. The lowest concentration of SARS-CoV-2 viral copies this assay can detect is 131 copies/mL. A negative result does not preclude SARS-Cov-2 infection and should not be used as the sole basis for treatment or other patient management decisions. A negative result may occur with  improper specimen collection/handling, submission of specimen other than nasopharyngeal swab, presence of viral mutation(s) within the areas targeted by this assay, and inadequate number of viral copies (<131 copies/mL). A negative result must be combined with clinical observations, patient history,  and epidemiological information. The expected result is Negative. Fact Sheet for Patients:  PinkCheek.be Fact Sheet for Healthcare Providers:  GravelBags.it This test is not yet ap proved or cleared by the [place] FDA and  has been authorized for detection and/or diagnosis of SARS-CoV-2 by FDA under an Emergency Use Authorization (EUA). This EUA will remain  in effect (meaning this test can be used) for the duration of the COVID-19 declaration under Section 564(b)(1) of the Act, 21 U.S.C. section 360bbb-3(b)(1), unless the authorization is terminated or revoked sooner.    Influenza A by PCR NEGATIVE NEGATIVE Final    Influenza B by PCR NEGATIVE NEGATIVE Final    Comment: (NOTE) The Xpert Xpress SARS-CoV-2/FLU/RSV assay is intended as an aid in  the diagnosis of influenza from Nasopharyngeal swab specimens and  should not be used as a sole basis for treatment. Nasal washings and  aspirates are unacceptable for Xpert Xpress SARS-CoV-2/FLU/RSV  testing. Fact Sheet for Patients: PinkCheek.be Fact Sheet for Healthcare Providers: GravelBags.it This test is not yet approved or cleared by the [place] FDA and  has been authorized for detection and/or diagnosis of SARS-CoV-2 by  FDA under an Emergency Use Authorization (EUA). This EUA will remain  in effect (meaning this test can be used) for the duration of the  Covid-19 declaration under Section 564(b)(1) of the Act, 21  U.S.C. section 360bbb-3(b)(1), unless the authorization is  terminated or revoked. Performed at [facility], [place] [address], [place], [place] [address]   Aerobic/Anaerobic Culture (surgical/deep wound)     Status: None (Preliminary result)   Collection Time: 01/28/20 12:55 PM   Specimen: Abscess  Result Value Ref Range Status   Specimen Description ABSCESS LIVER  Final   Special Requests Normal  Final   Gram Stain   Final    ABUNDANT WBC PRESENT,BOTH PMN AND MONONUCLEAR ABUNDANT GRAM POSITIVE COCCI Performed at [facility], [address], [place], [place] [address]    Culture   Final    ABUNDANT STREPTOCOCCUS INTERMEDIUS NO ANAEROBES ISOLATED; CULTURE IN PROGRESS FOR 5 DAYS    Report Status PENDING  Incomplete   Organism ID, Bacteria STREPTOCOCCUS INTERMEDIUS  Final      Susceptibility   Streptococcus intermedius - MIC*    PENICILLIN <=0.06 SENSITIVE Sensitive     CEFTRIAXONE <=0.12 SENSITIVE Sensitive     ERYTHROMYCIN <=0.12 SENSITIVE Sensitive     LEVOFLOXACIN 0.5 SENSITIVE Sensitive     VANCOMYCIN 0.5 SENSITIVE Sensitive     * ABUNDANT  STREPTOCOCCUS INTERMEDIUS  Culture, blood (routine x 2)     Status: None (Preliminary result)   Collection Time: 01/28/20  6:02 PM   Specimen: BLOOD  Result Value Ref Range Status   Specimen Description BLOOD RIGHT ANTECUBITAL  Final   Special Requests   Final    BOTTLES DRAWN AEROBIC AND ANAEROBIC Blood Culture adequate volume   Culture   Final    NO GROWTH 3 DAYS Performed at [facility], [place] [address], [place], [place] [address]    Report Status PENDING  Incomplete  Culture, blood (routine x 2)     Status: None (Preliminary result)   Collection Time: 01/28/20  6:17 PM   Specimen: BLOOD RIGHT HAND  Result Value Ref Range Status   Specimen Description BLOOD RIGHT HAND  Final   Special Requests   Final    BOTTLES DRAWN AEROBIC ONLY Blood Culture adequate volume   Culture   Final  NO GROWTH 3 DAYS Performed at [facility], [place] [address] [place], [place] [address]    Report Status PENDING  Incomplete    Imaging: No results found.

## 2020-01-31 NOTE — Progress Notes (Signed)
Manufacturing engineer [facility]) Hospital Liaison note.  Patient's wife called [facility] referral center requesting hospice for the patient after discharge.  Spoke with wife by phone to answer questions and offer support.   [place] liaison notified [facility] manager Tomi Bamberger, RN of family out reach for hospice.    [facility] hospital liaison will follow up tomorrow with TOC and patient's wife.   Please call with any hospice related questions.  Thank you, Farrel Gordon, RN, CCM [place] (listed on AMION under Hospice and [place] [facility] [place])    [phone]

## 2020-01-31 NOTE — Progress Notes (Addendum)
HEMATOLOGY-ONCOLOGY PROGRESS NOTE  SUBJECTIVE: Interviewed with assistance of Spanish interpreter. Afebrile. No bleeding has been noted.  He is not having any abdominal pain, nausea, vomiting.  Tolerating Gleevec well. He has no complaints today.   PHYSICAL EXAMINATION:  Vitals:   01/31/20 0815 01/31/20 1124  BP: 101/60 97/61  Pulse: 85 83  Resp: 18 19  Temp:  98.2 F (36.8 C)  SpO2: 96% 94%   Filed Weights   01/29/20 0432 01/30/20 0446 01/31/20 0900  Weight: 120 lb 2.4 oz (54.5 kg) 120 lb 4.8 oz (54.6 kg) 119 lb 14.9 oz (54.4 kg)    Intake/Output from previous day: 02/21 0701 - 02/22 0700 In: 2336.9 [P.O.:1320; I.V.:18.5; IV Piggyback:983.3] Out: 831 [Urine:750; Drains:80; Stool:1]  GENERAL:alert, no distress and comfortable LUNGS: clear to auscultation and percussion with normal breathing effort HEART: regular rate & rhythm and no murmurs and no lower extremity edema ABDOMEN: Positive bowel sounds, soft, nontender, palpable mass in the left upper quadrant of the abdomen NEURO: alert & oriented x 3 with fluent speech, no focal motor/sensory deficits  LABORATORY DATA:  I have reviewed the data as listed CMP Latest Ref Rng & Units 01/31/2020 01/30/2020 01/29/2020  Glucose 70 - 99 mg/dL 125(H) 127(H) 121(H)  BUN 8 - 23 mg/dL 13 18 24(H)  Creatinine 0.61 - 1.24 mg/dL 1.03 1.00 1.00  Sodium 135 - 145 mmol/L 133(L) 133(L) 132(L)  Potassium 3.5 - 5.1 mmol/L 4.1 4.1 4.5  Chloride 98 - 111 mmol/L 99 101 100  CO2 22 - 32 mmol/L 22 24 21(L)  Calcium 8.9 - 10.3 mg/dL 7.1(L) 7.1(L) 7.2(L)  Total Protein 6.5 - 8.1 g/dL 5.9(L) - 6.0(L)  Total Bilirubin 0.3 - 1.2 mg/dL 0.6 - 0.8  Alkaline Phos 38 - 126 U/L 187(H) - 162(H)  AST 15 - 41 U/L 74(H) - 56(H)  ALT 0 - 44 U/L 50(H) - 45(H)    Lab Results  Component Value Date   WBC 11.3 (H) 01/31/2020   HGB 7.9 (L) 01/31/2020   HCT 26.3 (L) 01/31/2020   MCV 83.5 01/31/2020   PLT 599 (H) 01/31/2020   NEUTROABS 6.2 01/21/2020    DG  Chest 2 View  Result Date: 01/27/2020 CLINICAL DATA:  Fever EXAM: CHEST - 2 VIEW COMPARISON:  01/22/2020 FINDINGS: The heart size and mediastinal contours are within normal limits. Both lungs are clear. Disc degenerative disease of the thoracic spine. IMPRESSION: No acute abnormality of the lungs. Electronically Signed   By: Eddie Candle M.D.   On: 01/27/2020 13:04   CT ABDOMEN PELVIS W CONTRAST  Result Date: 01/21/2020 CLINICAL DATA:  Loss of appetite EXAM: CT ABDOMEN AND PELVIS WITH CONTRAST TECHNIQUE: Multidetector CT imaging of the abdomen and pelvis was performed using the standard protocol following bolus administration of intravenous contrast. CONTRAST:  145mL OMNIPAQUE IOHEXOL 300 MG/ML  SOLN COMPARISON:  None. FINDINGS: Lower chest: No acute pleural or parenchymal lung disease. Hepatobiliary: Hypodense mass right lobe liver measures up to 5.2 x 6.9 cm coarsened for metastatic disease. Gallbladder is unremarkable. Pancreas: Unremarkable. No pancreatic ductal dilatation or surrounding inflammatory changes. Spleen: Normal in size without focal abnormality. Adrenals/Urinary Tract: Left renal cyst. Areas of right renal cortical scarring are noted. No obstructive uropathy. The adrenals are normal. Bladder is unremarkable. Stomach/Bowel: There is a large necrotic mass centered in the gastric body and antrum, measuring 16.5 by 10 cm in transverse dimension, and extending approximately 17.3 cm in craniocaudal length. Findings are consistent with gastric adenocarcinoma. This mass results  and mass effect upon the upper abdominal viscera but I do not see any direct invasion. No bowel obstruction.  Normal appendix. Vascular/Lymphatic: Vascular structures are patent. No critical stenosis. I do not see any discrete adenopathy within the abdomen or pelvis. Reproductive: The prostate is enlarged measuring 5.3 x 5.3 cm in transverse dimension and extending approximately 6.8 cm in craniocaudal length. There is mass  effect upon the posterior wall the bladder. Other: No free fluid.  No free intraperitoneal gas. Musculoskeletal: There are no acute or destructive bony lesions. Reconstructed images demonstrate no additional findings. IMPRESSION: 1. Large necrotic mass centered in the posterior wall of the stomach, consistent with gastric adenocarcinoma. Endoscopy is recommended for further evaluation. 2. Irregular hypodense mass right lobe liver, worrisome for metastatic disease. 3. Enlarged prostate. Electronically Signed   By: Randa Ngo M.D.   On: 01/21/2020 21:56   MR LIVER W WO CONTRAST  Result Date: 01/28/2020 CLINICAL DATA:  Inpatient. Weight loss. Low-grade fever. 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 Indeterminate liver mass on recent CT. EXAM: MRI ABDOMEN WITHOUT AND WITH CONTRAST TECHNIQUE: Multiplanar multisequence MR imaging of the abdomen was performed both before and after the administration of intravenous contrast. CONTRAST:  59mL GADAVIST GADOBUTROL 1 MMOL/ML IV SOLN COMPARISON:  01/21/2020 CT abdomen/pelvis. FINDINGS: Lower chest: Trace dependent right pleural effusion. Hepatobiliary: Top-normal liver size. 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 There is a complex multilocular 10.7 x 8.2 cm cystic liver mass in segment 7/8 right liver lobe (series 10/image 9) with restricted diffusion, thickened enhancing wall and thickened irregular internal septations, increased in size from 7.0 x 5.1 cm on 01/21/2020 CT using similar measurement technique. No additional liver lesions. Normal gallbladder with no cholelithiasis. No biliary ductal dilatation. Common bile duct diameter 3 mm. No choledocholithiasis. No biliary strictures, masses or beading. Pancreas: No pancreatic mass or duct dilation.  No pancreas divisum. Spleen: Normal size spleen. Splenic hemosiderosis. No splenic mass. Adrenals/Urinary Tract: Normal adrenals. No hydronephrosis. Simple 2.5 cm lateral upper left renal cyst. Additional  subcentimeter simple lower left renal cyst. No suspicious renal masses. Stomach/Bowel: Large exophytic centrally necrotic 17.3 x 12.3 cm gastric mass arising from posterior proximal stomach centered in the left upper quadrant (series 11/image 21) with thick enhancing wall, previously 17.0 x 11.0 cm on recent CT, not substantially changed. Visualized small and large bowel is normal caliber, with no bowel wall thickening. Vascular/Lymphatic: Normal caliber abdominal aorta. Patent portal, splenic, hepatic and renal veins. No pathologically enlarged lymph nodes in the abdomen. Other: Trace perihepatic ascites.  No focal fluid collection. Musculoskeletal: No aggressive appearing focal osseous lesions. IMPRESSION: 1. Complex multilocular 10.7 x 8.2 cm cystic right liver mass in segment 7/8, increased in size in the short interval since 01/21/2020 CT, indeterminate but with MRI features most suggestive of a liver abscess. Consider IR consultation for consideration of percutaneous drainage. 2. Large exophytic centrally necrotic 17.3 x 12.3 cm enhancing gastric mass arising from the posterior proximal stomach, compatible with biopsy-proven GIST. 3. Trace dependent right pleural effusion.  Trace ascites. 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 Electronically Signed   By: Ilona Sorrel M.D.   On: 01/28/2020 07:38   DG CHEST PORT 1 VIEW  Result Date: 01/22/2020 CLINICAL DATA:  Unable to eat, loss of appetite, no abdominal pain EXAM: PORTABLE CHEST 1 VIEW COMPARISON:  None. FINDINGS: Mixed streaky and patchy opacities are present in lung bases more focally in the retrocardiac/left lung base. No pneumothorax or effusion. The cardiomediastinal contours are  unremarkable. No acute osseous or soft tissue abnormality. Degenerative changes are present in the imaged spine and shoulders. Telemetry leads overlie the chest. IMPRESSION: Mixed streaky and patchy opacities in the bases favored to reflect atelectasis though early  infection could have a similar appearance. Electronically Signed   By: Lovena Le M.D.   On: 01/22/2020 00:18   CT IMAGE GUIDED DRAINAGE BY PERCUTANEOUS CATHETER  Result Date: 01/28/2020 INDICATION: Enlarging hepatic abscess EXAM: CT-guided posterior right hepatic abscess drain MEDICATIONS: The patient is currently admitted to the hospital and receiving intravenous antibiotics. The antibiotics were administered within an appropriate time frame prior to the initiation of the procedure. ANESTHESIA/SEDATION: Fentanyl 25 mcg IV; Versed 0.5 mg IV Moderate Sedation Time:  10 minutes The patient was continuously monitored during the procedure by the interventional radiology nurse under my direct supervision. COMPLICATIONS: None immediate. PROCEDURE: Informed written consent was obtained from the patient after a thorough discussion of the procedural risks, benefits and alternatives. All questions were addressed. Maximal Sterile Barrier Technique was utilized including caps, mask, sterile gowns, sterile gloves, sterile drape, hand hygiene and skin antiseptic. A timeout was performed prior to the initiation of the procedure. Previous imaging reviewed. Patient position slightly right anterior oblique. Noncontrast localization CT performed. The large posterior right hepatic lobe abscess was localized and marked for a lower intercostal approach in the mid axillary line. Under sterile conditions and local anesthesia, a 18 gauge needle was advanced under CT guidance into the fluid collection through a lower intercostal space. Needle position confirmed with CT. Syringe aspiration yielded purulent fluid. Sample sent for culture. Guidewire inserted followed by tract dilatation to a 12 [place] drain. Drain catheter position confirmed with CT. Syringe aspiration yielded 130 cc total blood tinged purulent fluid. No immediate complication. Patient tolerated the procedure well. Catheter secured with Prolene suture and connected to  external suction bulb. Sterile dressing applied. IMPRESSION: Successful CT-guided right posterior hepatic abscess drain placement. Electronically Signed   By: Jerilynn Mages.  Shick M.D.   On: 01/28/2020 14:34    ASSESSMENT AND PLAN: 1. GIST of the stomach 2. Liver lesion in the right lobe  -Liver mass consistent with abscess on MRI of the liver from 01/27/2020  -Status post CT-guided drainage of the liver abscess on 01/28/2020-culture positive for Streptococcus intermedius 3. Iron deficiency anemia 4. Gastritis and H. pylori infection 5. Fever secondary to the liver abscess 6. Protein calorie malnutrition  Kevin [name] appears stable.  Tolerating Gleevec well. He is status post CT-guided drain placement for liver abscess and fluid has been sent for culture which showed abundant WBC and gram + cocci. Hemoglobin is overall stable.  He is on oral iron.  Recommendations: 1.  Continue Gleevec. We are attempting to arrange for the patient to obtain this medication from the [facility] as an outpatient.  2. Remains on IV antibiotics for liver abscess. IR following. 3. Hemoglobin currently stable. He does not report any active bleeding. Recommend close monitoring of CBC and transfuse to keep hemoglobin above 7.  Continue oral iron. 4.  Continue Protonix and treat H. pylori as recommended by Dr. Penelope Coop   LOS: 9 days   Mikey Bussing, [place], AGPCNP-BC, AOCNP 01/31/20 Mr. Taj was interviewed and examined.  A Spanish interpreter was present.  He is tolerating the [place] well.  He is now maintained on penicillin for treatment of the liver abscess.  The cancer center pharmacy is working to obtain outpatient [place] for Mr. Laurin Coder.  Outpatient follow-up will be scheduled  at the Cancer center.

## 2020-01-31 NOTE — Progress Notes (Signed)
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  Ongoing  GOAL:   Patient will meet greater than or equal to 90% of their needs  Unmet  MONITOR:   PO intake, Supplement acceptance, Labs, Weight trends, Skin, I & O's  REASON FOR ASSESSMENT:   Consult Assessment of nutrition requirement/status  ASSESSMENT:   70 year old male with no pertinent past medical history presented with loss of appetite, weight loss, and black stools. CT abdomen pelvis showed large necrotic mass centered in posterior wall of stomach consistent gastric adenocarcinoma and irregular hypodense mass in right lobe of liver concerning for metastatic disease.  2/15- s/p upper GI- revealed malignant gastric tumor (biopsied) 2/16- advanced to regular diet 2/20- s/p drain placement for hepatic abscess (130 ml pus aspirated)  Reviewed I/O's: +1.5 L x 24 hours and +63 ml since admission  UOP: 750 ml x 24 hours  Drain output: 80 ml x 24 hours  Pt sleeping soundly at time of visit, did not respond to name being called.   Observed breakfast meal tray- pt consumed less than 25%. Documented meal completion 25-75%. Pt is now refusing Ensure supplements (declined today as well as multiple refusals over the weekend).   Oncology following; plan for chemotherapy to shrink tumor prior to surgical resection.    Medications reviewed and include  remeron.   Labs reviewed: Na: 133. Mg, K, and Phos WDL.   NUTRITION - FOCUSED PHYSICAL EXAM:    Most Recent Value  Orbital Region  Severe depletion  Upper Arm Region  Moderate depletion  Thoracic and Lumbar Region  Moderate depletion  Buccal Region  Severe depletion  Temple Region  Severe depletion  Clavicle Bone Region  Severe depletion  Clavicle and Acromion Bone Region  Severe depletion  Scapular Bone Region  Severe depletion  Dorsal Hand  Moderate depletion  Patellar Region  Moderate depletion  Anterior Thigh Region  Moderate depletion  Posterior Calf Region  Moderate depletion  Edema (RD Assessment)  None  Hair  Reviewed  Eyes  Reviewed  Mouth  Reviewed  Skin  Reviewed  Nails  Reviewed       Diet Order:   Diet Order            Diet regular Room service appropriate? Yes; Fluid consistency: Thin  Diet effective now        Diet general              EDUCATION NEEDS:   No education needs have been identified at this time  Skin:  Skin Assessment: Reviewed RN Assessment  Last BM:  01/30/20  Height:   Ht Readings from Last 1 Encounters:  01/22/20 5\' 6"  (1.676 m)    Weight:   Wt Readings from Last 1 Encounters:  01/31/20 54.4 kg    Ideal Body Weight:  64.6 kg  BMI:  Body mass index is 19.36 kg/m.  Estimated Nutritional Needs:   Kcal:  1900-2100  Protein:  95-110 grams  Fluid:  > 1.9 L    Loistine Chance, RD, LDN, [place] Registered Dietitian II Certified Diabetes Care and Education Specialist Please refer to [facility] for RD and/or RD on-call/weekend/after hours pager

## 2020-01-31 NOTE — Progress Notes (Signed)
Referring Physician(s): Mercy Riding, MD  Supervising Physician: Corrie Mckusick  Patient Status:  [facility] - In-pt  Chief Complaint:  Hepatic abscess  Brief History:  Kevin [name] is a 69 y.o. male who presented to the ED c/o abdominal pain.  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  He also had a liver lesion which was concerning for metastatic disease.  MRI was obtained which showed a complex multilocular 10.7 x 8.2 cm cystic right liver mass in segment 7/8, increased in size in the short interval since 01/21/2020 CT, indeterminate but with MRI features most suggestive of a liver abscess.   He underwent placement of a drain by Dr. Annamaria Boots on 01/28/20.  Subjective:  Sleeping during my visit.   Allergies: Patient has no known allergies.  Medications: Prior to Admission medications   Medication Sig Start Date End Date Taking? Authorizing Provider  bismuth subsalicylate (PEPTO-BISMOL) 262 MG/15ML suspension Take 30 mLs by mouth 4 (four) times daily -  before meals and at bedtime. 01/28/20   Mercy Riding, MD  feeding supplement, ENSURE ENLIVE, (ENSURE ENLIVE) LIQD Take 237 mLs by mouth 3 (three) times daily between meals. 01/28/20 02/27/20  Mercy Riding, MD  ferrous sulfate 325 (65 FE) MG tablet Take 1 tablet (325 mg total) by mouth 2 (two) times daily with a meal. 01/28/20   Mercy Riding, MD  imatinib (GLEEVEC) 400 MG tablet Take 1 tablet (400 mg total) by mouth daily. Take with meals and large glass of water.Caution:Chemotherapy. 01/27/20   Ladell Pier, MD  metroNIDAZOLE (FLAGYL) 250 MG tablet Take 1 tablet (250 mg total) by mouth 4 (four) times daily for 10 days. 01/28/20 02/07/20  Mercy Riding, MD  Multiple Vitamins-Minerals (MULTIVITAMIN WITH MINERALS) tablet Take 1 tablet by mouth daily. 01/28/20 07/26/20  Mercy Riding, MD  nicotine (NICODERM CQ - DOSED IN MG/24 HOURS) 14 mg/24hr patch Place 1 patch (14 mg total) onto the skin daily.  01/28/20   Mercy Riding, MD  ondansetron (ZOFRAN) 4 MG tablet Take 1 tablet (4 mg total) by mouth daily as needed for nausea or vomiting. 01/28/20 01/27/21  Mercy Riding, MD  pantoprazole (PROTONIX) 40 MG tablet Take 1 tablet (40 mg total) by mouth 2 (two) times daily for 10 days, THEN 1 tablet (40 mg total) daily for 20 days. 01/28/20 02/27/20  Mercy Riding, MD  senna-docusate (SENOKOT S) 8.6-50 MG tablet Take 1 tablet by mouth daily. 01/28/20   Mercy Riding, MD  tetracycline (SUMYCIN) 250 MG capsule Take 1 capsule (250 mg total) by mouth 4 (four) times daily. 01/28/20   Mercy Riding, MD     Vital Signs: BP 101/60   Pulse 85   Temp 98.6 F (37 C) (Oral)   Resp 18   Ht 5\' 6"  (1.676 m)   Wt 54.4 kg   SpO2 96%   BMI 19.36 kg/m   Physical Exam Constitutional:      Comments: Sleeping  HENT:     Head: Normocephalic and atraumatic.  Cardiovascular:     Rate and Rhythm: Normal rate.  Pulmonary:     Effort: Pulmonary effort is normal. No respiratory distress.  Abdominal:     Palpations: Abdomen is soft.     Tenderness: There is no abdominal tenderness.     Comments: RUQ drain in place. ~80 mL serosanguinous output recorded.  Skin:  General: Skin is warm and dry.  Neurological:     General: No focal deficit present.     Imaging: DG Chest 2 View  Result Date: 01/27/2020 CLINICAL DATA:  Fever EXAM: CHEST - 2 VIEW COMPARISON:  01/22/2020 FINDINGS: The heart size and mediastinal contours are within normal limits. Both lungs are clear. Disc degenerative disease of the thoracic spine. IMPRESSION: No acute abnormality of the lungs. Electronically Signed   By: Eddie Candle M.D.   On: 01/27/2020 13:04   MR LIVER W WO CONTRAST  Result Date: 01/28/2020 CLINICAL DATA:  Inpatient. Weight loss. Low-grade fever. 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 Indeterminate liver mass on recent CT. EXAM: MRI ABDOMEN WITHOUT AND WITH CONTRAST TECHNIQUE: Multiplanar multisequence MR imaging of  the abdomen was performed both before and after the administration of intravenous contrast. CONTRAST:  23mL GADAVIST GADOBUTROL 1 MMOL/ML IV SOLN COMPARISON:  01/21/2020 CT abdomen/pelvis. FINDINGS: Lower chest: Trace dependent right pleural effusion. Hepatobiliary: Top-normal liver size. 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 There is a complex multilocular 10.7 x 8.2 cm cystic liver mass in segment 7/8 right liver lobe (series 10/image 9) with restricted diffusion, thickened enhancing wall and thickened irregular internal septations, increased in size from 7.0 x 5.1 cm on 01/21/2020 CT using similar measurement technique. No additional liver lesions. Normal gallbladder with no cholelithiasis. No biliary ductal dilatation. Common bile duct diameter 3 mm. No choledocholithiasis. No biliary strictures, masses or beading. Pancreas: No pancreatic mass or duct dilation.  No pancreas divisum. Spleen: Normal size spleen. Splenic hemosiderosis. No splenic mass. Adrenals/Urinary Tract: Normal adrenals. No hydronephrosis. Simple 2.5 cm lateral upper left renal cyst. Additional subcentimeter simple lower left renal cyst. No suspicious renal masses. Stomach/Bowel: Large exophytic centrally necrotic 17.3 x 12.3 cm gastric mass arising from posterior proximal stomach centered in the left upper quadrant (series 11/image 21) with thick enhancing wall, previously 17.0 x 11.0 cm on recent CT, not substantially changed. Visualized small and large bowel is normal caliber, with no bowel wall thickening. Vascular/Lymphatic: Normal caliber abdominal aorta. Patent portal, splenic, hepatic and renal veins. No pathologically enlarged lymph nodes in the abdomen. Other: Trace perihepatic ascites.  No focal fluid collection. Musculoskeletal: No aggressive appearing focal osseous lesions. IMPRESSION: 1. Complex multilocular 10.7 x 8.2 cm cystic right liver mass in segment 7/8, increased in size in the short interval since 01/21/2020 CT,  indeterminate but with MRI features most suggestive of a liver abscess. Consider IR consultation for consideration of percutaneous drainage. 2. Large exophytic centrally necrotic 17.3 x 12.3 cm enhancing gastric mass arising from the posterior proximal stomach, compatible with biopsy-proven GIST. 3. Trace dependent right pleural effusion.  Trace ascites. 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 Electronically Signed   By: Ilona Sorrel M.D.   On: 01/28/2020 07:38   CT IMAGE GUIDED DRAINAGE BY PERCUTANEOUS CATHETER  Result Date: 01/28/2020 INDICATION: Enlarging hepatic abscess EXAM: CT-guided posterior right hepatic abscess drain MEDICATIONS: The patient is currently admitted to the hospital and receiving intravenous antibiotics. The antibiotics were administered within an appropriate time frame prior to the initiation of the procedure. ANESTHESIA/SEDATION: Fentanyl 25 mcg IV; Versed 0.5 mg IV Moderate Sedation Time:  10 minutes The patient was continuously monitored during the procedure by the interventional radiology nurse under my direct supervision. COMPLICATIONS: None immediate. PROCEDURE: Informed written consent was obtained from the patient after a thorough discussion of the procedural risks, benefits and alternatives. All questions were addressed. Maximal Sterile Barrier Technique was utilized including caps,  mask, sterile gowns, sterile gloves, sterile drape, hand hygiene and skin antiseptic. A timeout was performed prior to the initiation of the procedure. Previous imaging reviewed. Patient position slightly right anterior oblique. Noncontrast localization CT performed. The large posterior right hepatic lobe abscess was localized and marked for a lower intercostal approach in the mid axillary line. Under sterile conditions and local anesthesia, a 18 gauge needle was advanced under CT guidance into the fluid collection through a lower intercostal space. Needle position confirmed with CT. Syringe  aspiration yielded purulent fluid. Sample sent for culture. Guidewire inserted followed by tract dilatation to a 12 [place] drain. Drain catheter position confirmed with CT. Syringe aspiration yielded 130 cc total blood tinged purulent fluid. No immediate complication. Patient tolerated the procedure well. Catheter secured with Prolene suture and connected to external suction bulb. Sterile dressing applied. IMPRESSION: Successful CT-guided right posterior hepatic abscess drain placement. Electronically Signed   By: Jerilynn Mages.  Shick M.D.   On: 01/28/2020 14:34    Labs:  CBC: Recent Labs    01/28/20 0428 01/29/20 0532 01/30/20 0400 01/31/20 0323  WBC 9.0 8.4 8.9 11.3*  HGB 7.7* 7.9* 7.6* 7.9*  HCT 24.7* 25.4* 25.4* 26.3*  PLT 392 477* 469* 599*    COAGS: Recent Labs    01/28/20 1035  INR 1.4*    BMP: Recent Labs    01/28/20 0428 01/29/20 0532 01/30/20 0400 01/31/20 0323  NA 133* 132* 133* 133*  K 4.4 4.5 4.1 4.1  CL 100 100 101 99  CO2 23 21* 24 22  GLUCOSE 96 121* 127* 125*  BUN 19 24* 18 13  CALCIUM 7.5* 7.2* 7.1* 7.1*  CREATININE 0.79 1.00 1.00 1.03  GFRNONAA >60 >60 >60 >60  GFRAA >60 >60 >60 >60    LIVER FUNCTION TESTS: Recent Labs    01/22/20 1019 01/22/20 1019 01/23/20 0608 01/23/20 0608 01/28/20 0428 01/29/20 0532 01/30/20 0400 01/31/20 0323  BILITOT 1.4*  --  1.0  --   --  0.8  --  0.6  AST 52*  --  51*  --   --  56*  --  74*  ALT 29  --  31  --   --  45*  --  50*  ALKPHOS 88  --  98  --   --  162*  --  187*  PROT 5.6*  --  5.5*  --   --  6.0*  --  5.9*  ALBUMIN 1.4*   < > 1.2*   < > 1.2* 1.1*  1.1* <1.0* 1.0*  1.0*   < > = values in this interval not displayed.    Assessment and Plan:  Gastrointestinal stromal tumor.  Hepatic abscess.  S/P drain placement 01/28/20 by Dr. Annamaria Boots.  Cultures grew abundant Streptococcus intermedius.  Flushes easily.  Continue routine drain care.  Recommend CT scan when output down to about 10-15 mL per  day.  Electronically Signed: Murrell Redden, PA-C 01/31/2020, 10:21 AM    I spent a total of 15 Minutes at the the patient's bedside AND on the patient's hospital floor or unit, greater than 50% of which was counseling/coordinating care for f/u hepatic drain.

## 2020-01-31 NOTE — Progress Notes (Signed)
PROGRESS NOTE  Kevin [name] [id number] DOB: 07-21-1951   PCP: Patient, No Pcp Per   Patient is from: home  DOA: 01/21/2020 LOS: 9  Brief Narrative / Interim history: 69 year old Spanish male with history of tobacco use disorder presented to ED 2/12 with difficulty eating and unintentional weight loss of about 65 pounds.  Also had melanotic stool.  In ED, CT abdomen and pelvis revealed large necrotic mass in the posterior wall of the stomach and irregular hypodense mass in the right lobe of the liver concerning for metastatic disease.  Hgb 6.2 with normal MCV.   Patient received 2 units of packed red blood cells with appropriate response.  Started on PPI.  GI consulted, and he had EGD and biopsy of the mass on 2/15.  Pathology consistent with gastric stromal tumor.  Also positive for H. pylori infection.  General surgery consulted and recommended neoadjuvant Gleevec prior to resection.  Oncology consulted and started [place], and ordered MRI liver which revealed complex multilocular 10.7 x 8.2 cm cystic right liver mass that has increased in size from his recent CT about a week ago.  IR consulted, and he had drainage of liver abscess and drain placement on 2/19.  He was a started on IV Zosyn after discussion with ID, Dr. Tommy Medal.  Of note, patient had fever on 12/17 and 2/18.   Subjective: No major events overnight or this morning.  No complaint this morning.  He denies pain, dyspnea, nausea or vomiting.  Had multiple bowel movements after bowel regimen for constipation.  Objective: Vitals:   01/30/20 2019 01/31/20 0425 01/31/20 0815 01/31/20 0900  BP: 113/64 108/64 101/60   Pulse: 98 91 85   Resp: 16 18 18    Temp: 99.3 F (37.4 C) 98.6 F (37 C)    TempSrc: Oral Oral    SpO2: 94% 95% 96%   Weight:    54.4 kg  Height:        Intake/Output Summary (Last 24 hours) at 01/31/2020 1006 Last data filed at 01/31/2020 0902 Gross per 24 hour  Intake 1856.86 ml  Output 631 ml  Net  1225.86 ml   Filed Weights   01/29/20 0432 01/30/20 0446 01/31/20 0900  Weight: 54.5 kg 54.6 kg 54.4 kg    Examination:.   GENERAL: Chronically ill-appearing.  No apparent distress. HEENT: MMM.  Vision and hearing grossly intact.  NECK: Supple.  No apparent JVD.  RESP:  No IWOB. Good air movement bilaterally. CVS:  RRR. Heart sounds normal.  ABD/GI/GU: Bowel sounds present. Soft.  JP drain with small serosanguineous fluid MSK/EXT:  Moves extremities. No apparent deformity. No edema.  SKIN: no apparent skin lesion or wound NEURO: Awake, alert and oriented appropriately.  No apparent focal neuro deficit. PSYCH: Calm. Normal affect.  Procedures:  EGD on 2/15 revealed normal esophagus, a large infiltrative mass with no bleeding was found in the posterior wall of the stomach.   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  Pathology confirmed gastric stromal tumor. -General surgery recommends neoadjuvant chemotherapy prior to resection -Oncology started [place] on 2/18  Liver abscess: MRI on 2/19 revealed complex multilocular 10.7 x 8.2 cm cystic liver mass that has increased in size in short interval since the CT on 2/12.  Has mild leukocytosis this morning. -S/p IR drain and drain placement on 2/19-culture grew Streptococcus intermedius -IV Zosyn after discussion with ID 2/19-2/21>> IV penicillin G 2/21>  -Add probiotics -Trend leukocytosis  Gastritis/H. pylori infection -Continue PPI -H. pylori treatment-upon discharge  Iron deficiency anemia due to chronic blood loss likely from GI tumor.  Iron sat 7%. -Hgb 6.2 (admit)>2u>> 8.7> 7.7> 7.9 -IV Feraheme on 2/18 and 2/21  Tobacco use disorder -Encouraged cessation -Continue nicotine patch  Fever: Likely from liver abscess.  CXR without acute finding.  Last fever on 2/18. -Management as above  Transaminitis: Likely due to liver abscess and IR procedure.  Slightly up  trending. -Check acute hepatitis panel -Continue monitoring  Hyponatremia: Stable. -Continue monitoring  Metabolic acidosis: Improved. -Continue monitoring  Constipation: Resolved.  Hypoalbuminemia: Likely due to malnutrition and poor p.o. intake in the setting of illness. -Intervention as below.  Moderate malnutrition/unintentional weight loss due to poor p.o. intake and GIST-he is down from 127 to 120 pounds during this hospitalization. -Appreciate dietitian input-noted that he has not been drinking his Ensure. -Add low-dose Remeron. Nutrition Problem: Inadequate oral intake Etiology: altered GI function(large necrotic stomach mass concerning for gastric adenocarcinoma)  Signs/Symptoms: per patient/family report(poor appetite due to early satiety and endorses 65 lb wt loss over the past 3 months)  Interventions: Ensure Enlive (each supplement provides 350kcal and 20 grams of protein), Magic cup, MVI   DVT prophylaxis: SCD Code Status: Full code Family Communication: Patient and/or RN. Available if any question.  Discharge barrier: Liver abscess. Patient with significant output from JP drain and on IV antibiotics  Patient is from: Home Final disposition: Likely home once cleared by IR.  Consultants: GI (off), general surgery (off), oncology, IR, ID   Microbiology summarized: [id number] negative. Influenza PCR negative.  Sch Meds:  Scheduled Meds: . sodium chloride   Intravenous Once  . feeding supplement (ENSURE ENLIVE)  237 mL Oral TID BM  . ferrous sulfate  325 mg Oral BID WC  . imatinib  400 mg Oral Q breakfast  . influenza vaccine adjuvanted  0.5 mL Intramuscular Tomorrow-1000  . multivitamin with minerals  1 tablet Oral Daily  . nicotine  14 mg Transdermal Daily  . pantoprazole  40 mg Oral BID  . pneumococcal 23 valent vaccine  0.5 mL Intramuscular Tomorrow-1000  . saccharomyces boulardii  250 mg Oral BID  . sodium chloride flush  5 mL Intracatheter Q8H    Continuous Infusions: . sodium chloride 250 mL (01/30/20 1533)  . pencillin G potassium IV 4 Million Units (01/31/20 0816)   PRN Meds:.sodium chloride, acetaminophen **OR** acetaminophen, morphine injection, ondansetron (ZOFRAN) IV, polyethylene glycol, senna-docusate  Antimicrobials: Anti-infectives (From admission, onward)   Start     Dose/Rate Route Frequency Ordered Stop   01/30/20 1445  penicillin G potassium 4 Million Units in dextrose 5 % 250 mL IVPB     4 Million Units 250 mL/hr over 60 Minutes Intravenous Every 4 hours 01/30/20 1408     01/28/20 1430  piperacillin-tazobactam (ZOSYN) IVPB 3.375 g  Status:  Discontinued     3.375 g 12.5 mL/hr over 240 Minutes Intravenous Every 8 hours 01/28/20 1429 01/30/20 1408   01/28/20 0000  metroNIDAZOLE (FLAGYL) 250 MG tablet     250 mg Oral 4 times daily 01/28/20 0716 02/07/20 2359   01/28/20 0000  tetracycline (SUMYCIN) 250 MG capsule     250 mg Oral 4 times daily 01/28/20 0716         I have personally reviewed the following labs and images: CBC: Recent Labs  Lab 01/24/20 1327 01/24/20 1327 01/27/20 0755 01/28/20 0428 01/29/20 0532 01/30/20 0400 01/31/20 0323  WBC 10.2  --   --  9.0 8.4 8.9 11.3*  HGB 8.7*   < > 8.1* 7.7* 7.9* 7.6* 7.9*  HCT 28.2*   < > 26.0* 24.7* 25.4* 25.4* 26.3*  MCV 82.0  --   --  80.7 81.4 82.7 83.5  PLT 445*  --   --  392 477* 469* 599*   < > = values in this interval not displayed.   BMP &GFR Recent Labs  Lab 01/28/20 0428 01/29/20 0532 01/30/20 0400 01/31/20 0323  NA 133* 132* 133* 133*  K 4.4 4.5 4.1 4.1  CL 100 100 101 99  CO2 23 21* 24 22  GLUCOSE 96 121* 127* 125*  BUN 19 24* 18 13  CREATININE 0.79 1.00 1.00 1.03  CALCIUM 7.5* 7.2* 7.1* 7.1*  MG 2.4 2.5* 2.5* 2.4  PHOS 4.1 4.4 3.8 3.1   Estimated Creatinine Clearance: 53.5 mL/min (by C-G formula based on SCr of 1.03 mg/dL). Liver & Pancreas: Recent Labs  Lab 01/28/20 0428 01/29/20 0532 01/30/20 0400 01/31/20 0323  AST   --  56*  --  74*  ALT  --  45*  --  50*  ALKPHOS  --  162*  --  187*  BILITOT  --  0.8  --  0.6  PROT  --  6.0*  --  5.9*  ALBUMIN 1.2* 1.1*  1.1* <1.0* 1.0*  1.0*   No results for input(s): LIPASE, AMYLASE in the last 168 hours. No results for input(s): AMMONIA in the last 168 hours. Diabetic: No results for input(s): HGBA1C in the last 72 hours. No results for input(s): GLUCAP in the last 168 hours. Cardiac Enzymes: No results for input(s): CKTOTAL, CKMB, CKMBINDEX, TROPONINI in the last 168 hours. No results for input(s): PROBNP in the last 8760 hours. Coagulation Profile: Recent Labs  Lab 01/28/20 1035  INR 1.4*   Thyroid Function Tests: No results for input(s): TSH, T4TOTAL, FREET4, T3FREE, THYROIDAB in the last 72 hours. Lipid Profile: No results for input(s): CHOL, HDL, LDLCALC, TRIG, CHOLHDL, LDLDIRECT in the last 72 hours. Anemia Panel: No results for input(s): VITAMINB12, FOLATE, FERRITIN, TIBC, IRON, RETICCTPCT in the last 72 hours. Urine analysis:    Component Value Date/Time   COLORURINE YELLOW 01/22/2020 0418   APPEARANCEUR CLEAR 01/22/2020 0418   LABSPEC 1.040 (H) 01/22/2020 0418   PHURINE 5.0 01/22/2020 0418   GLUCOSEU NEGATIVE 01/22/2020 0418   HGBUR NEGATIVE 01/22/2020 0418   BILIRUBINUR NEGATIVE 01/22/2020 0418   KETONESUR NEGATIVE 01/22/2020 0418   PROTEINUR NEGATIVE 01/22/2020 0418   NITRITE NEGATIVE 01/22/2020 0418   LEUKOCYTESUR NEGATIVE 01/22/2020 0418   Sepsis Labs: Invalid input(s): PROCALCITONIN, [place]  Microbiology: Recent Results (from the past 240 hour(s))  Respiratory Panel by RT PCR (Flu A&B, Covid) - Nasopharyngeal Swab     Status: None   Collection Time: 01/21/20 11:20 PM   Specimen: Nasopharyngeal Swab  Result Value Ref Range Status   SARS Coronavirus 2 by RT PCR NEGATIVE NEGATIVE Final    Comment: (NOTE) SARS-CoV-2 target nucleic acids are NOT DETECTED. The SARS-CoV-2 RNA is generally detectable in upper  respiratoy specimens during the acute phase of infection. The lowest concentration of SARS-CoV-2 viral copies this assay can detect is 131 copies/mL. A negative result does not preclude SARS-Cov-2 infection and should not be used as the sole basis for treatment or other patient management decisions. A negative result may occur with  improper specimen collection/handling, submission of specimen other than nasopharyngeal swab, presence of viral mutation(s) within the areas targeted by this assay,  and inadequate number of viral copies (<131 copies/mL). A negative result must be combined with clinical observations, patient history, and epidemiological information. The expected result is Negative. Fact Sheet for Patients:  PinkCheek.be Fact Sheet for Healthcare Providers:  GravelBags.it This test is not yet ap proved or cleared by the [place] FDA and  has been authorized for detection and/or diagnosis of SARS-CoV-2 by FDA under an Emergency Use Authorization (EUA). This EUA will remain  in effect (meaning this test can be used) for the duration of the COVID-19 declaration under Section 564(b)(1) of the Act, 21 U.S.C. section 360bbb-3(b)(1), unless the authorization is terminated or revoked sooner.    Influenza A by PCR NEGATIVE NEGATIVE Final   Influenza B by PCR NEGATIVE NEGATIVE Final    Comment: (NOTE) The Xpert Xpress SARS-CoV-2/FLU/RSV assay is intended as an aid in  the diagnosis of influenza from Nasopharyngeal swab specimens and  should not be used as a sole basis for treatment. Nasal washings and  aspirates are unacceptable for Xpert Xpress SARS-CoV-2/FLU/RSV  testing. Fact Sheet for Patients: PinkCheek.be Fact Sheet for Healthcare Providers: GravelBags.it This test is not yet approved or cleared by the [place] FDA and  has been authorized for  detection and/or diagnosis of SARS-CoV-2 by  FDA under an Emergency Use Authorization (EUA). This EUA will remain  in effect (meaning this test can be used) for the duration of the  Covid-19 declaration under Section 564(b)(1) of the Act, 21  U.S.C. section 360bbb-3(b)(1), unless the authorization is  terminated or revoked. Performed at [facility], [place] [address], [place], [place] [address]   Aerobic/Anaerobic Culture (surgical/deep wound)     Status: None (Preliminary result)   Collection Time: 01/28/20 12:55 PM   Specimen: Abscess  Result Value Ref Range Status   Specimen Description ABSCESS LIVER  Final   Special Requests Normal  Final   Gram Stain   Final    ABUNDANT WBC PRESENT,BOTH PMN AND MONONUCLEAR ABUNDANT GRAM POSITIVE COCCI Performed at [facility], [address] [place], [place] [address]    Culture   Final    ABUNDANT STREPTOCOCCUS INTERMEDIUS NO ANAEROBES ISOLATED; CULTURE IN PROGRESS FOR 5 DAYS    Report Status PENDING  Incomplete   Organism ID, Bacteria STREPTOCOCCUS INTERMEDIUS  Final      Susceptibility   Streptococcus intermedius - MIC*    PENICILLIN <=0.06 SENSITIVE Sensitive     CEFTRIAXONE <=0.12 SENSITIVE Sensitive     ERYTHROMYCIN <=0.12 SENSITIVE Sensitive     LEVOFLOXACIN 0.5 SENSITIVE Sensitive     VANCOMYCIN 0.5 SENSITIVE Sensitive     * ABUNDANT STREPTOCOCCUS INTERMEDIUS  Culture, blood (routine x 2)     Status: None (Preliminary result)   Collection Time: 01/28/20  6:02 PM   Specimen: BLOOD  Result Value Ref Range Status   Specimen Description BLOOD RIGHT ANTECUBITAL  Final   Special Requests   Final    BOTTLES DRAWN AEROBIC AND ANAEROBIC Blood Culture adequate volume   Culture   Final    NO GROWTH 3 DAYS Performed at [facility], [place] [address], [place], [place] [address]    Report Status PENDING  Incomplete  Culture, blood (routine x 2)     Status: None (Preliminary result)   Collection Time: 01/28/20  6:17 PM    Specimen: BLOOD RIGHT HAND  Result Value Ref Range Status   Specimen Description BLOOD RIGHT HAND  Final   Special Requests   Final  BOTTLES DRAWN AEROBIC ONLY Blood Culture adequate volume   Culture   Final    NO GROWTH 3 DAYS Performed at [facility], [place] [address] [place], [place] [address]    Report Status PENDING  Incomplete    Radiology Studies: No results found.   Rayley Gao T. Hancock  If 7PM-7AM, please contact night-coverage [email or web address] Password [facility] 01/31/2020, 10:06 AM low despite above

## 2020-02-01 ENCOUNTER — Telehealth: Payer: Self-pay

## 2020-02-01 DIAGNOSIS — D72825 Bandemia: Secondary | ICD-10-CM

## 2020-02-01 DIAGNOSIS — Z789 Other specified health status: Secondary | ICD-10-CM

## 2020-02-01 DIAGNOSIS — D473 Essential (hemorrhagic) thrombocythemia: Secondary | ICD-10-CM

## 2020-02-01 LAB — HEPATIC FUNCTION PANEL
ALT: 50 U/L — ABNORMAL HIGH (ref 0–44)
AST: 66 U/L — ABNORMAL HIGH (ref 15–41)
Albumin: <1 g/dL — ABNORMAL LOW (ref 3.5–5.0)
Alkaline Phosphatase: 165 U/L — ABNORMAL HIGH (ref 38–126)
Bilirubin, Direct: 0.1 mg/dL (ref 0.0–0.2)
Indirect Bilirubin: 0.3 mg/dL (ref 0.3–0.9)
Total Bilirubin: 0.4 mg/dL (ref 0.3–1.2)
Total Protein: 5.6 g/dL — ABNORMAL LOW (ref 6.5–8.1)

## 2020-02-01 LAB — RENAL FUNCTION PANEL
Albumin: <1 g/dL — ABNORMAL LOW (ref 3.5–5.0)
Anion gap: 10 (ref 5–15)
BUN: 15 mg/dL (ref 8–23)
CO2: 22 mmol/L (ref 22–32)
Calcium: 7.1 mg/dL — ABNORMAL LOW (ref 8.9–10.3)
Chloride: 98 mmol/L (ref 98–111)
Creatinine, Ser: 0.97 mg/dL (ref 0.61–1.24)
GFR calc Af Amer: >60 mL/min (ref >60)
GFR calc non Af Amer: >60 mL/min (ref >60)
Glucose, Bld: 123 mg/dL — ABNORMAL HIGH (ref 70–99)
Phosphorus: 2.9 mg/dL (ref 2.5–4.6)
Potassium: 4.2 mmol/L (ref 3.5–5.1)
Sodium: 130 mmol/L — ABNORMAL LOW (ref 135–145)

## 2020-02-01 LAB — CBC
HCT: 24.7 % — ABNORMAL LOW (ref 39.0–52.0)
Hemoglobin: 7.4 g/dL — ABNORMAL LOW (ref 13.0–17.0)
MCH: 25.1 pg — ABNORMAL LOW (ref 26.0–34.0)
MCHC: 30 g/dL (ref 30.0–36.0)
MCV: 83.7 fL (ref 80.0–100.0)
Platelets: 563 10*3/uL — ABNORMAL HIGH (ref 150–400)
RBC: 2.95 MIL/uL — ABNORMAL LOW (ref 4.22–5.81)
RDW: 18.9 % — ABNORMAL HIGH (ref 11.5–15.5)
WBC: 11.1 10*3/uL — ABNORMAL HIGH (ref 4.0–10.5)
nRBC: 0 % (ref 0.0–0.2)

## 2020-02-01 LAB — MAGNESIUM: Magnesium: 2.1 mg/dL (ref 1.7–2.4)

## 2020-02-01 MED ORDER — SODIUM CHLORIDE 0.9 % IV SOLN
3.0000 g | Freq: Four times a day (QID) | INTRAVENOUS | Status: DC
  Administered 2020-02-01 – 2020-02-03 (×10): 3 g via INTRAVENOUS
  Filled 2020-02-01 (×3): qty 8
  Filled 2020-02-01 (×5): qty 3
  Filled 2020-02-01 (×2): qty 8
  Filled 2020-02-01: qty 3
  Filled 2020-02-01: qty 8
  Filled 2020-02-01: qty 3
  Filled 2020-02-01: qty 8

## 2020-02-01 NOTE — Evaluation (Addendum)
Physical Therapy Evaluation Patient Details Name: Kevin [name] MRN: [id number] DOB: [birth year] Today's Date: 02/01/2020   History of Present Illness  69 yo male admitted to ED on 2/12 with significant weight loss, loss of appetite. 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 PMH includes tobacco use.  Clinical Impression   Pt presents with LE weakness, impaired static and dynamic standing balance, mild difficulty performing decreased activity tolerance due to deconditioning. Pt to benefit from acute PT to address deficits. Pt ambulated short room distance with HHA and min assist to steady. At baseline, pt is independent with mobility and worked full time. PT recommending OPPT to address deficits. PT to progress mobility as tolerated, and will continue to follow acutely.   [employer] translator utilized: [place], [place]   Follow Up Recommendations Outpatient PT;Supervision for mobility/OOB    Equipment Recommendations  Other (comment)(TBD)    Recommendations for Other Services       Precautions / Restrictions Precautions Precautions: Fall Precaution Comments: R JP drain Restrictions Weight Bearing Restrictions: No      Mobility  Bed Mobility Overal bed mobility: Needs Assistance Bed Mobility: Rolling;Sidelying to Sit;Sit to Supine Rolling: Min guard Sidelying to sit: Min assist;HOB elevated   Sit to supine: Min assist;HOB elevated   General bed mobility comments: min guard for roll to L side, min assist for supine<>sit for trunk elevation via HHA from PT and LE management.  Transfers Overall transfer level: Needs assistance Equipment used: 1 person hand held assist Transfers: Sit to/from Stand Sit to Stand: Min assist;From elevated surface         General transfer comment: Min assist for steadying, initial power up from bed via  HHA.  Ambulation/Gait Ambulation/Gait assistance: Min assist Gait Distance (Feet): 20 Feet Assistive device: 1 person hand held assist Gait Pattern/deviations: Step-through pattern;Decreased stride length;Narrow base of support;Trunk flexed Gait velocity: decr   General Gait Details: Min assist for steadying gait, very slow gait with narrow BOS noted during stepping.  Stairs            Wheelchair Mobility    Modified Rankin (Stroke Patients Only)       Balance Overall balance assessment: Needs assistance Sitting-balance support: No upper extremity supported;Feet supported Sitting balance-Leahy Scale: Fair Sitting balance - Comments: tolerates dynamic tasks in sitting without LOB   Standing balance support: Single extremity supported;During functional activity Standing balance-Leahy Scale: Fair Standing balance comment: requires light assist via HHA to steady                             Pertinent Vitals/Pain Pain Assessment: No/denies pain    Home Living Family/patient expects to be discharged to:: Private residence Living Arrangements: Spouse/significant other;Other relatives;Children(mother in law) Available Help at Discharge: Family;Available PRN/intermittently(wife works) Type of Home: House Home Access: Stairs to enter   [employer] of Steps: 1 Home Layout: Two level;Able to live on main level with bedroom/bathroom Home Equipment: None      Prior Function Level of Independence: Independent         Comments: Pt reports working PTA, did not require any assist for mobility or ADLs.     Hand Dominance   Dominant Hand: Right    Extremity/Trunk Assessment   Upper Extremity Assessment Upper Extremity Assessment: Defer to OT evaluation    Lower  Extremity Assessment Lower Extremity Assessment: Generalized weakness    Cervical / Trunk Assessment Cervical / Trunk Assessment: Normal  Communication   Communication: Prefers  language other than [place];Interpreter utilized  Cognition Arousal/Alertness: Awake/alert Behavior During Therapy: Flat affect Overall Cognitive Status: Within Functional Limits for tasks assessed                                 General Comments: Pt responds appropriately to questions, answers PT questions with very few words.      General Comments General comments (skin integrity, edema, etc.): no dizziness or lightheadedness reported during mobility    Exercises General Exercises - Lower Extremity Long Arc Quad: AROM;Both;15 reps;Seated   Assessment/Plan    PT Assessment Patient needs continued PT services  PT Problem List Decreased strength;Decreased mobility;Decreased activity tolerance;Decreased balance;Decreased knowledge of use of DME       PT Treatment Interventions DME instruction;Therapeutic activities;Gait training;Therapeutic exercise;Patient/family education;Balance training;Stair training;Functional mobility training    PT Goals (Current goals can be found in the Care Plan section)  Acute Rehab PT Goals Patient Stated Goal: go home PT Goal Formulation: With patient Time For Goal Achievement: 02/15/20 Potential to Achieve Goals: Good    Frequency Min 3X/week   Barriers to discharge        Co-evaluation               AM-PAC PT "6 Clicks" Mobility  Outcome Measure Help needed turning from your back to your side while in a flat bed without using bedrails?: A Little Help needed moving from lying on your back to sitting on the side of a flat bed without using bedrails?: A Little Help needed moving to and from a bed to a chair (including a wheelchair)?: A Little Help needed standing up from a chair using your arms (e.g., wheelchair or bedside chair)?: A Little Help needed to walk in hospital room?: A Little Help needed climbing 3-5 steps with a railing? : A Lot 6 Click Score: 17    End of Session Equipment Utilized During Treatment: Gait  belt Activity Tolerance: Patient tolerated treatment well;Patient limited by fatigue Patient left: in bed;with call bell/phone within reach;with bed alarm set Nurse Communication: Mobility status;Other (comment)(IV alarming) PT Visit Diagnosis: Muscle weakness (generalized) (M62.81);Difficulty in walking, not elsewhere classified (R26.2)    Time: 0927-0950 PT Time Calculation (min) (ACUTE ONLY): 23 min   Charges:   PT Evaluation $PT Eval Low Complexity: 1 Low PT Treatments $Gait Training: 8-22 mins       Kynnadi Dicenso E, PT Acute Rehabilitation Services Pager [phone]  Office [phone]   Roxine Caddy D Elonda Husky 02/01/2020, 12:23 PM

## 2020-02-01 NOTE — TOC Progression Note (Signed)
Transition of Care [facility]) - Progression Note    Patient Details  Name: Kevin [name] MRN: [id number] Date of Birth: Jan 22, 1951  [facility] CM/SW Contact  Zenon Mayo, RN Phone Number: 02/01/2020, 12:00 PM  Clinical Narrative:    Mary with palliative was informed that wife can not meet today but can tomorrow, Stanton Kidney states she will call wife to reschedule.        Expected Discharge Plan and Services           Expected Discharge Date: 01/28/20                                     Social Determinants of Health (SDOH) Interventions    Readmission Risk Interventions No flowsheet data found.

## 2020-02-01 NOTE — Plan of Care (Signed)
  Problem: Health Behavior/Discharge Planning: Goal: Ability to manage health-related needs will improve Outcome: Progressing   Problem: Clinical Measurements: Goal: Ability to maintain clinical measurements within normal limits will improve Outcome: Progressing   Problem: Nutrition: Goal: Adequate nutrition will be maintained Outcome: Progressing   

## 2020-02-01 NOTE — Progress Notes (Signed)
Referring Physician(s): Mercy Riding, MD  Supervising Physician: Aletta Edouard  Patient Status:  [facility] - In-pt  Chief Complaint: Follow up hepatic abscess drain placed 01/28/20 by Dr. Annamaria Boots  Subjective:  Patient sleeping on exam, arouses to voice cues but quickly returns to sleep. When asked if he has any belly pain he shakes his head no. No staff or visitors present during exam.   Allergies: Patient has no known allergies.  Medications: Prior to Admission medications   Medication Sig Start Date End Date Taking? Authorizing Provider  bismuth subsalicylate (PEPTO-BISMOL) 262 MG/15ML suspension Take 30 mLs by mouth 4 (four) times daily -  before meals and at bedtime. 01/28/20   Mercy Riding, MD  feeding supplement, ENSURE ENLIVE, (ENSURE ENLIVE) LIQD Take 237 mLs by mouth 3 (three) times daily between meals. 01/28/20 02/27/20  Mercy Riding, MD  ferrous sulfate 325 (65 FE) MG tablet Take 1 tablet (325 mg total) by mouth 2 (two) times daily with a meal. 01/28/20   Mercy Riding, MD  imatinib (GLEEVEC) 400 MG tablet Take 1 tablet (400 mg total) by mouth daily. Take with meals and large glass of water.Caution:Chemotherapy. 01/27/20   Ladell Pier, MD  metroNIDAZOLE (FLAGYL) 250 MG tablet Take 1 tablet (250 mg total) by mouth 4 (four) times daily for 10 days. 01/28/20 02/07/20  Mercy Riding, MD  Multiple Vitamins-Minerals (MULTIVITAMIN WITH MINERALS) tablet Take 1 tablet by mouth daily. 01/28/20 07/26/20  Mercy Riding, MD  nicotine (NICODERM CQ - DOSED IN MG/24 HOURS) 14 mg/24hr patch Place 1 patch (14 mg total) onto the skin daily. 01/28/20   Mercy Riding, MD  ondansetron (ZOFRAN) 4 MG tablet Take 1 tablet (4 mg total) by mouth daily as needed for nausea or vomiting. 01/28/20 01/27/21  Mercy Riding, MD  pantoprazole (PROTONIX) 40 MG tablet Take 1 tablet (40 mg total) by mouth 2 (two) times daily for 10 days, THEN 1 tablet (40 mg total) daily for 20 days. 01/28/20 02/27/20  Mercy Riding, MD    senna-docusate (SENOKOT S) 8.6-50 MG tablet Take 1 tablet by mouth daily. 01/28/20   Mercy Riding, MD  tetracycline (SUMYCIN) 250 MG capsule Take 1 capsule (250 mg total) by mouth 4 (four) times daily. 01/28/20   Mercy Riding, MD     Vital Signs: BP 113/68   Pulse 93   Temp 99.3 F (37.4 C) (Oral)   Resp 18   Ht 5\' 6"  (1.676 m)   Wt 123 lb 9.6 oz (56.1 kg) Comment: scale a  SpO2 93%   BMI 19.95 kg/m   Physical Exam Vitals reviewed.  Constitutional:      General: He is not in acute distress.    Appearance: He is ill-appearing.     Comments: (+) somnolent  HENT:     Head: Normocephalic.  Cardiovascular:     Rate and Rhythm: Normal rate.  Pulmonary:     Effort: Pulmonary effort is normal.  Abdominal:     General: There is no distension.     Palpations: Abdomen is soft.     Tenderness: There is no abdominal tenderness.     Comments: (+) RUQ drain to suction with ~25 cc cloudy, blood tinged output. Insertion site clean, dry, dressed appropriately. No leakage or active bleeding.  Skin:    General: Skin is warm and dry.     Imaging: CT IMAGE GUIDED DRAINAGE BY PERCUTANEOUS CATHETER  Result Date: 01/28/2020 INDICATION:  Enlarging hepatic abscess EXAM: CT-guided posterior right hepatic abscess drain MEDICATIONS: The patient is currently admitted to the hospital and receiving intravenous antibiotics. The antibiotics were administered within an appropriate time frame prior to the initiation of the procedure. ANESTHESIA/SEDATION: Fentanyl 25 mcg IV; Versed 0.5 mg IV Moderate Sedation Time:  10 minutes The patient was continuously monitored during the procedure by the interventional radiology nurse under my direct supervision. COMPLICATIONS: None immediate. PROCEDURE: Informed written consent was obtained from the patient after a thorough discussion of the procedural risks, benefits and alternatives. All questions were addressed. Maximal Sterile Barrier Technique was utilized including  caps, mask, sterile gowns, sterile gloves, sterile drape, hand hygiene and skin antiseptic. A timeout was performed prior to the initiation of the procedure. Previous imaging reviewed. Patient position slightly right anterior oblique. Noncontrast localization CT performed. The large posterior right hepatic lobe abscess was localized and marked for a lower intercostal approach in the mid axillary line. Under sterile conditions and local anesthesia, a 18 gauge needle was advanced under CT guidance into the fluid collection through a lower intercostal space. Needle position confirmed with CT. Syringe aspiration yielded purulent fluid. Sample sent for culture. Guidewire inserted followed by tract dilatation to a 12 [place] drain. Drain catheter position confirmed with CT. Syringe aspiration yielded 130 cc total blood tinged purulent fluid. No immediate complication. Patient tolerated the procedure well. Catheter secured with Prolene suture and connected to external suction bulb. Sterile dressing applied. IMPRESSION: Successful CT-guided right posterior hepatic abscess drain placement. Electronically Signed   By: Jerilynn Mages.  Shick M.D.   On: 01/28/2020 14:34    Labs:  CBC: Recent Labs    01/29/20 0532 01/30/20 0400 01/31/20 0323 02/01/20 0553  WBC 8.4 8.9 11.3* 11.1*  HGB 7.9* 7.6* 7.9* 7.4*  HCT 25.4* 25.4* 26.3* 24.7*  PLT 477* 469* 599* 563*    COAGS: Recent Labs    01/28/20 1035  INR 1.4*    BMP: Recent Labs    01/29/20 0532 01/30/20 0400 01/31/20 0323 02/01/20 0553  NA 132* 133* 133* 130*  K 4.5 4.1 4.1 4.2  CL 100 101 99 98  CO2 21* 24 22 22   GLUCOSE 121* 127* 125* 123*  BUN 24* 18 13 15   CALCIUM 7.2* 7.1* 7.1* 7.1*  CREATININE 1.00 1.00 1.03 0.97  GFRNONAA >60 >60 >60 >60  GFRAA >60 >60 >60 >60    LIVER FUNCTION TESTS: Recent Labs    01/23/20 0608 01/28/20 0428 01/29/20 0532 01/30/20 0400 01/31/20 0323 02/01/20 0553  BILITOT 1.0  --  0.8  --  0.6 0.4  AST 51*  --  56*   --  74* 66*  ALT 31  --  45*  --  50* 50*  ALKPHOS 98  --  162*  --  187* 165*  PROT 5.5*  --  6.0*  --  5.9* 5.6*  ALBUMIN 1.2*   < > 1.1*  1.1* <1.0* 1.0*  1.0* <1.0*  <1.0*   < > = values in this interval not displayed.    Assessment and Plan:  69 y/o M s/p hepatic abscess drain placement 01/28/20 in IR seen today for drain follow up.  No issue reported with flushing, incision site is unremarkable, patient does not appear to be tender on my exam today. Output remains significant per I/O (60 cc in last 24H) with ~25 cc cloudy, blood tinged output on my exam today. Cx of aspirate (+) for strep intermedius - currently receiving Unasyn per primary team.  Tmax 101.1, hypotensive but near apparent baseline, WBC stable at 11.1 today, hgb down trending slightly (7.4 >>7.9 >>7.6), plt stable at 563, albumin <1.0, LFTs elevated but down trending as well.   Continue TID flushes with 5 cc NS, record output Qshift, dressing changes QD, call IR if difficulty flushing drain or sudden change in output amount or appearance. Discharge planning per primary service - if output remains significant he will go home with the drain and come back to see [place] in clinic approximately 10 days after discharge for follow up imaging/possible injection. If output <10 mL in 24H (not including flush) while inpatient will plan for repeat imaging prior to d/c.  IR will continue to follow - please call with questions or concerns.   Electronically Signed: Joaquim Nam, PA-C 02/01/2020, 10:43 AM   I spent a total of 15 Minutes at the the patient's bedside AND on the patient's hospital floor or unit, greater than 50% of which was counseling/coordinating care for hepatic abscess drain follow up.

## 2020-02-01 NOTE — Progress Notes (Signed)
PROGRESS NOTE  Kevin [name] [id number] DOB: [birth year]   PCP: Patient, No Pcp Per   Patient is from: home  DOA: 01/21/2020 LOS: 10  Brief Narrative / Interim history: 69 year old Spanish male with history of tobacco use disorder presented to ED 2/12 with difficulty eating and unintentional weight loss of about 65 pounds.  Also had melanotic stool.  In ED, CT abdomen and pelvis revealed large necrotic mass in the posterior wall of the stomach and irregular hypodense mass in the right lobe of the liver concerning for metastatic disease.  Hgb 6.2 with normal MCV.   Patient received 2 units of packed red blood cells with appropriate response.  Started on PPI.  GI consulted, and he had EGD and biopsy of the mass on 2/15.  Pathology consistent with gastric stromal tumor.  Also positive for H. pylori infection.  General surgery consulted and recommended neoadjuvant Gleevec prior to resection.  Oncology consulted and started [place], and ordered MRI liver which revealed complex multilocular 10.7 x 8.2 cm cystic right liver mass that has increased in size from his recent CT about a week ago.  IR consulted, and he had drainage of liver abscess and drain placement on 2/19.  He was a started on IV Zosyn after discussion with ID, Dr. Tommy Medal.  Of note, patient had fever on 12/17, 2/18 and 2/22  Subjective: Spiked fever to 101 last night.  No complaint this morning.  He denies pain, dyspnea, GI or UTI symptoms.  Had about 60 cc from JP drain in the last 24 hours.  WBC 11.1.  Slight drop in hemoglobin to 7.4.  Platelet of 563.  Noted that, patient's wife reached out to hospice yesterday.   Video interpreter with ID number [id number] used for this encounter. Objective: Vitals:   01/31/20 1124 01/31/20 1934 01/31/20 2000 02/01/20 0346  BP: 97/61 103/64  113/68  Pulse: 83 92  93  Resp: 19 18  18   Temp: 98.2 F (36.8 C) (!) 101.1 F (38.4 C) 98.3 F (36.8 C) 99.3 F (37.4 C)  TempSrc: Oral  Oral Oral    SpO2: 94% 95%  93%  Weight:    56.1 kg  Height:        Intake/Output Summary (Last 24 hours) at 02/01/2020 1041 Last data filed at 02/01/2020 0500 Gross per 24 hour  Intake 1360 ml  Output 660 ml  Net 700 ml   Filed Weights   01/30/20 0446 01/31/20 0900 02/01/20 0346  Weight: 54.6 kg 54.4 kg 56.1 kg    Examination:.   GENERAL: Chronically ill-appearing.  No apparent distress. HEENT: MMM.  Vision and hearing grossly intact.  NECK: Supple.  No apparent JVD.  RESP:  No IWOB. Good air movement bilaterally. CVS:  RRR. Heart sounds normal.  ABD/GI/GU: Bowel sounds present. Soft. Non tender.  JP drain with serosanguineous fluid MSK/EXT:  Moves extremities. No apparent deformity. No edema.  SKIN: no apparent skin lesion or wound NEURO: Awake, alert and oriented appropriately.  No apparent focal neuro deficit. PSYCH: Calm. Normal affect.   Procedures:  EGD on 2/15 revealed normal esophagus, a large infiltrative mass with no bleeding was found in the posterior wall of the stomach.   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  Pathology confirmed gastric stromal tumor. -General surgery recommends neoadjuvant chemotherapy prior to resection -Oncology started [place] on 2/18  Liver abscess: MRI on 2/19 revealed complex multilocular 10.7 x 8.2 cm cystic liver  mass that has increased in size in short interval since the CT on 2/12.  Has mild leukocytosis.  Also spiked fever. -S/p IR drain and drain placement on 2/19-culture grew Streptococcus intermedius -IV Zosyn after discussion with ID 2/19-2/21>> IV penicillin G 2/21> 2/23 -Change antibiotics to Unasyn for broader coverage given leukocytosis and fever. -Continue probiotics -Trend leukocytosis  Gastritis/H. pylori infection -Continue PPI -H. pylori treatment-upon discharge  Iron deficiency anemia due to chronic blood loss likely from GI tumor.  Iron sat 7%. -Hgb 6.2 (admit)>2u>>  8.7> 7.7> 7.9> 7.4 -IV Feraheme on 2/18 and 2/21  Tobacco use disorder: Patient denies a smoking cigarette. -Discontinue nicotine patch  Mild transaminitis: Likely due to liver abscess and IR procedure.  Acute hepatitis panel negative.  Improving. -Check HIV. -Continue monitoring  Hyponatremia: Na 130 today -Continue monitoring  Metabolic acidosis: Resolved.  Constipation: Resolved.  Hypoalbuminemia: Albumin less than 1.0.  Likely due to malnutrition and poor p.o. intake in the setting of illness. -Intervention as below.  Language barrier affecting care and communication -Used video interpreter.  Leukocytosis/thrombocytosis: Likely reactive due to liver abscess -Continue monitoring.  Severe malnutrition/unintentional weight loss due to poor p.o. intake and GIST-he is down from 127 to 120 pounds during this hospitalization. -Appreciate dietitian input-noted that he has not been drinking his Ensure. -Added low-dose Remeron at night Nutrition Problem: Severe Malnutrition Etiology: chronic illness(GIST tumor)  Signs/Symptoms: moderate fat depletion, severe fat depletion, moderate muscle depletion, severe muscle depletion  Interventions: MVI, Boost Breeze, Prostat, Magic cup   DVT prophylaxis: SCD Code Status: Full code Family Communication: Patient and/or Therapist, sports. Available if any question.  Discharge barrier: Liver abscess. Patient with significant output from JP drain and on IV antibiotics  Patient is from: Home Final disposition: Likely home once cleared by IR.  Consultants: GI (off), general surgery (off), oncology, IR, ID   Microbiology summarized: [id number] negative. Influenza PCR negative.  Sch Meds:  Scheduled Meds: . sodium chloride   Intravenous Once  . feeding supplement  1 Container Oral TID BM  . feeding supplement (PRO-STAT SUGAR FREE 64)  30 mL Oral BID  . ferrous sulfate  325 mg Oral BID WC  . imatinib  400 mg Oral Q breakfast  . influenza vaccine  adjuvanted  0.5 mL Intramuscular Tomorrow-1000  . mirtazapine  7.5 mg Oral QHS  . multivitamin with minerals  1 tablet Oral Daily  . nicotine  14 mg Transdermal Daily  . pantoprazole  40 mg Oral BID  . pneumococcal 23 valent vaccine  0.5 mL Intramuscular Tomorrow-1000  . saccharomyces boulardii  250 mg Oral BID  . sodium chloride flush  5 mL Intracatheter Q8H   Continuous Infusions: . sodium chloride 250 mL (01/30/20 1533)  . ampicillin-sulbactam (UNASYN) IV 3 g (02/01/20 0842)   PRN Meds:.sodium chloride, acetaminophen **OR** acetaminophen, morphine injection, ondansetron (ZOFRAN) IV, polyethylene glycol, senna-docusate  Antimicrobials: Anti-infectives (From admission, onward)   Start     Dose/Rate Route Frequency Ordered Stop   02/01/20 0730  Ampicillin-Sulbactam (UNASYN) 3 g in sodium chloride 0.9 % 100 mL IVPB     3 g 200 mL/hr over 30 Minutes Intravenous Every 6 hours 02/01/20 0718     01/30/20 1445  penicillin G potassium 4 Million Units in dextrose 5 % 250 mL IVPB  Status:  Discontinued     4 Million Units 250 mL/hr over 60 Minutes Intravenous Every 4 hours 01/30/20 1408 02/01/20 0718   01/28/20 1430  piperacillin-tazobactam (ZOSYN) IVPB 3.375 g  Status:  Discontinued     3.375 g 12.5 mL/hr over 240 Minutes Intravenous Every 8 hours 01/28/20 1429 01/30/20 1408   01/28/20 0000  metroNIDAZOLE (FLAGYL) 250 MG tablet     250 mg Oral 4 times daily 01/28/20 0716 02/07/20 2359   01/28/20 0000  tetracycline (SUMYCIN) 250 MG capsule     250 mg Oral 4 times daily 01/28/20 0716         I have personally reviewed the following labs and images: CBC: Recent Labs  Lab 01/28/20 0428 01/29/20 0532 01/30/20 0400 01/31/20 0323 02/01/20 0553  WBC 9.0 8.4 8.9 11.3* 11.1*  HGB 7.7* 7.9* 7.6* 7.9* 7.4*  HCT 24.7* 25.4* 25.4* 26.3* 24.7*  MCV 80.7 81.4 82.7 83.5 83.7  PLT 392 477* 469* 599* 563*   BMP &GFR Recent Labs  Lab 01/28/20 0428 01/29/20 0532 01/30/20 0400 01/31/20 0323  02/01/20 0553  NA 133* 132* 133* 133* 130*  K 4.4 4.5 4.1 4.1 4.2  CL 100 100 101 99 98  CO2 23 21* 24 22 22   GLUCOSE 96 121* 127* 125* 123*  BUN 19 24* 18 13 15   CREATININE 0.79 1.00 1.00 1.03 0.97  CALCIUM 7.5* 7.2* 7.1* 7.1* 7.1*  MG 2.4 2.5* 2.5* 2.4 2.1  PHOS 4.1 4.4 3.8 3.1 2.9   Estimated Creatinine Clearance: 58.6 mL/min (by C-G formula based on SCr of 0.97 mg/dL). Liver & Pancreas: Recent Labs  Lab 01/28/20 0428 01/29/20 0532 01/30/20 0400 01/31/20 0323 02/01/20 0553  AST  --  56*  --  74* 66*  ALT  --  45*  --  50* 50*  ALKPHOS  --  162*  --  187* 165*  BILITOT  --  0.8  --  0.6 0.4  PROT  --  6.0*  --  5.9* 5.6*  ALBUMIN 1.2* 1.1*  1.1* <1.0* 1.0*  1.0* <1.0*  <1.0*   No results for input(s): LIPASE, AMYLASE in the last 168 hours. No results for input(s): AMMONIA in the last 168 hours. Diabetic: No results for input(s): HGBA1C in the last 72 hours. No results for input(s): GLUCAP in the last 168 hours. Cardiac Enzymes: No results for input(s): CKTOTAL, CKMB, CKMBINDEX, TROPONINI in the last 168 hours. No results for input(s): PROBNP in the last 8760 hours. Coagulation Profile: Recent Labs  Lab 01/28/20 1035  INR 1.4*   Thyroid Function Tests: No results for input(s): TSH, T4TOTAL, FREET4, T3FREE, THYROIDAB in the last 72 hours. Lipid Profile: No results for input(s): CHOL, HDL, LDLCALC, TRIG, CHOLHDL, LDLDIRECT in the last 72 hours. Anemia Panel: No results for input(s): VITAMINB12, FOLATE, FERRITIN, TIBC, IRON, RETICCTPCT in the last 72 hours. Urine analysis:    Component Value Date/Time   COLORURINE YELLOW 01/22/2020 0418   APPEARANCEUR CLEAR 01/22/2020 0418   LABSPEC 1.040 (H) 01/22/2020 0418   PHURINE 5.0 01/22/2020 0418   GLUCOSEU NEGATIVE 01/22/2020 0418   HGBUR NEGATIVE 01/22/2020 0418   BILIRUBINUR NEGATIVE 01/22/2020 0418   KETONESUR NEGATIVE 01/22/2020 0418   PROTEINUR NEGATIVE 01/22/2020 0418   NITRITE NEGATIVE 01/22/2020 0418    LEUKOCYTESUR NEGATIVE 01/22/2020 0418   Sepsis Labs: Invalid input(s): PROCALCITONIN, [place]  Microbiology: Recent Results (from the past 240 hour(s))  Aerobic/Anaerobic Culture (surgical/deep wound)     Status: None (Preliminary result)   Collection Time: 01/28/20 12:55 PM   Specimen: Abscess  Result Value Ref Range Status   Specimen Description ABSCESS LIVER  Final   Special Requests Normal  Final   Gram Stain  Final    ABUNDANT WBC PRESENT,BOTH PMN AND MONONUCLEAR ABUNDANT GRAM POSITIVE COCCI Performed at [facility], [place] [address] [place], [place] [address]    Culture   Final    ABUNDANT STREPTOCOCCUS INTERMEDIUS NO ANAEROBES ISOLATED; CULTURE IN PROGRESS FOR 5 DAYS    Report Status PENDING  Incomplete   Organism ID, Bacteria STREPTOCOCCUS INTERMEDIUS  Final      Susceptibility   Streptococcus intermedius - MIC*    PENICILLIN <=0.06 SENSITIVE Sensitive     CEFTRIAXONE <=0.12 SENSITIVE Sensitive     ERYTHROMYCIN <=0.12 SENSITIVE Sensitive     LEVOFLOXACIN 0.5 SENSITIVE Sensitive     VANCOMYCIN 0.5 SENSITIVE Sensitive     * ABUNDANT STREPTOCOCCUS INTERMEDIUS  Culture, blood (routine x 2)     Status: None (Preliminary result)   Collection Time: 01/28/20  6:02 PM   Specimen: BLOOD  Result Value Ref Range Status   Specimen Description BLOOD RIGHT ANTECUBITAL  Final   Special Requests   Final    BOTTLES DRAWN AEROBIC AND ANAEROBIC Blood Culture adequate volume   Culture   Final    NO GROWTH 4 DAYS Performed at [facility], [place] [address], [place], [place] [address]    Report Status PENDING  Incomplete  Culture, blood (routine x 2)     Status: None (Preliminary result)   Collection Time: 01/28/20  6:17 PM   Specimen: BLOOD RIGHT HAND  Result Value Ref Range Status   Specimen Description BLOOD RIGHT HAND  Final   Special Requests   Final    BOTTLES DRAWN AEROBIC ONLY Blood Culture adequate volume   Culture   Final    NO GROWTH 4 DAYS Performed at  [facility], [place] [address] [place], [place] [address]    Report Status PENDING  Incomplete    Radiology Studies: No results found.   Natara Monfort [address]  If 7PM-7AM, please contact night-coverage [email or web address] Password [facility] 02/01/2020, 10:41 AM low despite above

## 2020-02-01 NOTE — Telephone Encounter (Signed)
Attempted to contact patient's wife about getting Gleevec from [facility]. I have not been able to reach her and I have left a couple of messages. I will try to continue to reach her to discuss getting the [place] from [place] [facility] [place] Phone [phone] Fax [phone] 02/01/2020 11:49 AM

## 2020-02-02 DIAGNOSIS — C49A2 Gastrointestinal stromal tumor of stomach: Principal | ICD-10-CM

## 2020-02-02 DIAGNOSIS — E43 Unspecified severe protein-calorie malnutrition: Secondary | ICD-10-CM

## 2020-02-02 DIAGNOSIS — Z66 Do not resuscitate: Secondary | ICD-10-CM

## 2020-02-02 DIAGNOSIS — Z515 Encounter for palliative care: Secondary | ICD-10-CM

## 2020-02-02 LAB — HEPATIC FUNCTION PANEL
ALT: 50 U/L — ABNORMAL HIGH (ref 0–44)
AST: 60 U/L — ABNORMAL HIGH (ref 15–41)
Albumin: <1 g/dL — ABNORMAL LOW (ref 3.5–5.0)
Alkaline Phosphatase: 165 U/L — ABNORMAL HIGH (ref 38–126)
Bilirubin, Direct: 0.2 mg/dL (ref 0.0–0.2)
Indirect Bilirubin: 0.4 mg/dL (ref 0.3–0.9)
Total Bilirubin: 0.6 mg/dL (ref 0.3–1.2)
Total Protein: 5.8 g/dL — ABNORMAL LOW (ref 6.5–8.1)

## 2020-02-02 LAB — AEROBIC/ANAEROBIC CULTURE W GRAM STAIN (SURGICAL/DEEP WOUND): Special Requests: NORMAL

## 2020-02-02 LAB — CULTURE, BLOOD (ROUTINE X 2)
Culture: NO GROWTH
Culture: NO GROWTH
Special Requests: ADEQUATE
Special Requests: ADEQUATE

## 2020-02-02 LAB — CBC
HCT: 25.5 % — ABNORMAL LOW (ref 39.0–52.0)
Hemoglobin: 7.5 g/dL — ABNORMAL LOW (ref 13.0–17.0)
MCH: 24.9 pg — ABNORMAL LOW (ref 26.0–34.0)
MCHC: 29.4 g/dL — ABNORMAL LOW (ref 30.0–36.0)
MCV: 84.7 fL (ref 80.0–100.0)
Platelets: 625 10*3/uL — ABNORMAL HIGH (ref 150–400)
RBC: 3.01 MIL/uL — ABNORMAL LOW (ref 4.22–5.81)
RDW: 19.3 % — ABNORMAL HIGH (ref 11.5–15.5)
WBC: 9 10*3/uL (ref 4.0–10.5)
nRBC: 0 % (ref 0.0–0.2)

## 2020-02-02 LAB — MAGNESIUM: Magnesium: 2.3 mg/dL (ref 1.7–2.4)

## 2020-02-02 LAB — RENAL FUNCTION PANEL
Albumin: <1 g/dL — ABNORMAL LOW (ref 3.5–5.0)
Anion gap: 10 (ref 5–15)
BUN: 21 mg/dL (ref 8–23)
CO2: 23 mmol/L (ref 22–32)
Calcium: 7.2 mg/dL — ABNORMAL LOW (ref 8.9–10.3)
Chloride: 102 mmol/L (ref 98–111)
Creatinine, Ser: 0.85 mg/dL (ref 0.61–1.24)
GFR calc Af Amer: >60 mL/min (ref >60)
GFR calc non Af Amer: >60 mL/min (ref >60)
Glucose, Bld: 103 mg/dL — ABNORMAL HIGH (ref 70–99)
Phosphorus: 3.7 mg/dL (ref 2.5–4.6)
Potassium: 4.3 mmol/L (ref 3.5–5.1)
Sodium: 135 mmol/L (ref 135–145)

## 2020-02-02 LAB — HIV ANTIBODY (ROUTINE TESTING W REFLEX): HIV Screen 4th Generation wRfx: NONREACTIVE

## 2020-02-02 MED ORDER — PRO-STAT SUGAR FREE PO LIQD
30.0000 mL | Freq: Three times a day (TID) | ORAL | Status: DC
  Administered 2020-02-02: 30 mL via ORAL
  Filled 2020-02-02 (×2): qty 30

## 2020-02-02 NOTE — Progress Notes (Signed)
PROGRESS NOTE  Karmello Silcox [id number] DOB: Apr 03, 1951   PCP: Patient, No Pcp Per   Patient is from: home  DOA: 01/21/2020 LOS: 11  Brief Narrative / Interim history: 69 year old Spanish male with history of tobacco use disorder presented to ED 2/12 with difficulty eating and unintentional weight loss of about 65 pounds.  Also had melanotic stool.  In ED, CT abdomen and pelvis revealed large necrotic mass in the posterior wall of the stomach and irregular hypodense mass in the right lobe of the liver concerning for metastatic disease.  Hgb 6.2 with normal MCV.   Patient received 2 units of packed red blood cells with appropriate response.  Started on PPI.  GI consulted, and he had EGD and biopsy of the mass on 2/15.  Pathology consistent with gastric stromal tumor.  Also positive for H. pylori infection.  General surgery consulted and recommended neoadjuvant Gleevec prior to resection.  Oncology consulted and started [place], and ordered MRI liver which revealed complex multilocular 10.7 x 8.2 cm cystic right liver mass that has increased in size from his recent CT about a week ago.  IR consulted, and he had drainage of liver abscess and drain placement on 2/19.  He was a started on IV Zosyn after discussion with ID, Dr. Tommy Medal.  Of note, patient had fever on 12/17, 2/18 and 2/22  Subjective: No major events overnight or this morning.  No complaints.  Per RN, flat affect and poor appetite.  He denies chest pain, dyspnea, GI or UTI symptoms.  Video interpreter with ID number [id number] used for this encounter. Objective: Vitals:   02/01/20 2044 02/02/20 0007 02/02/20 0516 02/02/20 1140  BP: 106/66  102/62 100/63  Pulse: 91  87 85  Resp:   18 20  Temp:   98.9 F (37.2 C)   TempSrc:   Oral   SpO2:   96% 98%  Weight:  55.7 kg    Height:        Intake/Output Summary (Last 24 hours) at 02/02/2020 1218 Last data filed at 02/02/2020 0818 Gross per 24 hour  Intake 625.54 ml  Output 230  ml  Net 395.54 ml   Filed Weights   01/31/20 0900 02/01/20 0346 02/02/20 0007  Weight: 54.4 kg 56.1 kg 55.7 kg    Examination:.   GENERAL: Chronically ill-appearing.  No apparent distress. HEENT: MMM.  Vision and hearing grossly intact.  NECK: Supple.  No apparent JVD.  RESP:  No IWOB. Good air movement bilaterally. CVS:  RRR. Heart sounds normal.  ABD/GI/GU: Bowel sounds present. Soft. Non tender.  JP drain with serosanguineous fluid. MSK/EXT:  Moves extremities. No apparent deformity. No edema.  SKIN: no apparent skin lesion or wound NEURO: Awake, alert and oriented appropriately.  No apparent focal neuro deficit. PSYCH: Calm.  Flat affect  Procedures:  EGD on 2/15 revealed normal esophagus, a large infiltrative mass with no bleeding was found in the posterior wall of the stomach.   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  Pathology confirmed gastric stromal tumor. -General surgery recommends neoadjuvant chemotherapy prior to resection -Oncology started [place] on 2/18  Liver abscess: MRI on 2/19 revealed complex multilocular 10.7 x 8.2 cm cystic liver mass that has increased in size in short interval since the CT on 2/12.  IR drain and drain placement on 2/19.  Culture grew Streptococcus intermedius.  Antibiotics de-escalating to penicillin G and patient spiked fever on 2/22.  Changed  antibiotic to Unasyn on 2/23.  JP drain 30 cc in the last 24 hours. -IV Zosyn after discussion with ID 2/19-2/21>> IV penicillin G 2/21> 2/23> Unasyn 2/23> -IR recommended repeat CT if  drain <10 cc or discharge with JP drain and outpatient follow-up in 10 days. -Continue probiotics  Gastritis/H. pylori infection -Continue PPI -H. pylori treatment-upon discharge  Iron deficiency anemia due to chronic blood loss likely from GI tumor.  Iron sat 7%. -Hgb 6.2 (admit)>2u>> 8.7> 7.7> 7.9> 7.5 -IV Feraheme on 2/18 and 2/21  Tobacco use disorder:  Patient denies a smoking cigarette. -Discontinued nicotine patch  Mild transaminitis: Likely due to liver abscess and IR procedure.  Acute hepatitis panel negative.  Improving. -Check HIV. -Continue monitoring  Hyponatremia: Resolved. -Continue monitoring  Metabolic acidosis: Resolved.  Constipation: Resolved.  Hypoalbuminemia: Albumin less than 1.0.  Likely due to malnutrition and poor p.o. intake in the setting of illness. -Intervention as below.  Language barrier affecting care and communication -Used video interpreter.  Leukocytosis/thrombocytosis: Likely reactive due to liver abscess -Continue monitoring.  Goal of care: Patient reach out to [facility] for hosp[facility] service -Palliative care consulted.  Severe malnutrition/unintentional weight loss due to poor p.o. intake and GIST-he is down from 127 to 120 pounds during this hospitalization. -Appreciate dietitian input-noted that he has not been drinking his Ensure. -Increase Remeron to 15 mg nightly Nutrition Problem: Severe Malnutrition Etiology: chronic illness(GIST tumor)  Signs/Symptoms: moderate fat depletion, severe fat depletion, moderate muscle depletion, severe muscle depletion  Interventions: MVI, Boost Breeze, Prostat, Magic cup   DVT prophylaxis: SCD Code Status: Full code Family Communication: Patient and/or Therapist, sports. Available if any question.  Discharge barrier: Liver abscess. Patient with significant output from JP drain and on IV antibiotics  Patient is from: Home Final disposition: Likely home on 2/25  Consultants: GI (off), general surgery (off), oncology, IR, ID, palliative care   Microbiology summarized: [id number] negative. Influenza PCR negative.  Sch Meds:  Scheduled Meds: . sodium chloride   Intravenous Once  . feeding supplement  1 Container Oral TID BM  . feeding supplement (PRO-STAT SUGAR FREE 64)  30 mL Oral BID  . ferrous sulfate  325 mg Oral BID WC  . imatinib  400 mg Oral Q  breakfast  . influenza vaccine adjuvanted  0.5 mL Intramuscular Tomorrow-1000  . mirtazapine  7.5 mg Oral QHS  . multivitamin with minerals  1 tablet Oral Daily  . nicotine  14 mg Transdermal Daily  . pantoprazole  40 mg Oral BID  . pneumococcal 23 valent vaccine  0.5 mL Intramuscular Tomorrow-1000  . saccharomyces boulardii  250 mg Oral BID  . sodium chloride flush  5 mL Intracatheter Q8H   Continuous Infusions: . sodium chloride 250 mL (01/30/20 1533)  . ampicillin-sulbactam (UNASYN) IV 3 g (02/02/20 0640)   PRN Meds:.sodium chloride, acetaminophen **OR** acetaminophen, morphine injection, ondansetron (ZOFRAN) IV, polyethylene glycol, senna-docusate  Antimicrobials: Anti-infectives (From admission, onward)   Start     Dose/Rate Route Frequency Ordered Stop   02/01/20 0730  Ampicillin-Sulbactam (UNASYN) 3 g in sodium chloride 0.9 % 100 mL IVPB     3 g 200 mL/hr over 30 Minutes Intravenous Every 6 hours 02/01/20 0718     01/30/20 1445  penicillin G potassium 4 Million Units in dextrose 5 % 250 mL IVPB  Status:  Discontinued     4 Million Units 250 mL/hr over 60 Minutes Intravenous Every 4 hours 01/30/20 1408 02/01/20 0718   01/28/20 1430  piperacillin-tazobactam (ZOSYN)  IVPB 3.375 g  Status:  Discontinued     3.375 g 12.5 mL/hr over 240 Minutes Intravenous Every 8 hours 01/28/20 1429 01/30/20 1408   01/28/20 0000  metroNIDAZOLE (FLAGYL) 250 MG tablet     250 mg Oral 4 times daily 01/28/20 0716 02/07/20 2359   01/28/20 0000  tetracycline (SUMYCIN) 250 MG capsule     250 mg Oral 4 times daily 01/28/20 0716         I have personally reviewed the following labs and images: CBC: Recent Labs  Lab 01/29/20 0532 01/30/20 0400 01/31/20 0323 02/01/20 0553 02/02/20 0437  WBC 8.4 8.9 11.3* 11.1* 9.0  HGB 7.9* 7.6* 7.9* 7.4* 7.5*  HCT 25.4* 25.4* 26.3* 24.7* 25.5*  MCV 81.4 82.7 83.5 83.7 84.7  PLT 477* 469* 599* 563* 625*   BMP &GFR Recent Labs  Lab 01/29/20 0532  01/30/20 0400 01/31/20 0323 02/01/20 0553 02/02/20 0437  NA 132* 133* 133* 130* 135  K 4.5 4.1 4.1 4.2 4.3  CL 100 101 99 98 102  CO2 21* 24 22 22 23   GLUCOSE 121* 127* 125* 123* 103*  BUN 24* 18 13 15 21   CREATININE 1.00 1.00 1.03 0.97 0.85  CALCIUM 7.2* 7.1* 7.1* 7.1* 7.2*  MG 2.5* 2.5* 2.4 2.1 2.3  PHOS 4.4 3.8 3.1 2.9 3.7   Estimated Creatinine Clearance: 65.5 mL/min (by C-G formula based on SCr of 0.85 mg/dL). Liver & Pancreas: Recent Labs  Lab 01/29/20 0532 01/30/20 0400 01/31/20 0323 02/01/20 0553 02/02/20 0437  AST 56*  --  74* 66* 60*  ALT 45*  --  50* 50* 50*  ALKPHOS 162*  --  187* 165* 165*  BILITOT 0.8  --  0.6 0.4 0.6  PROT 6.0*  --  5.9* 5.6* 5.8*  ALBUMIN 1.1*  1.1* <1.0* 1.0*  1.0* <1.0*  <1.0* <1.0*  <1.0*   No results for input(s): LIPASE, AMYLASE in the last 168 hours. No results for input(s): AMMONIA in the last 168 hours. Diabetic: No results for input(s): HGBA1C in the last 72 hours. No results for input(s): GLUCAP in the last 168 hours. Cardiac Enzymes: No results for input(s): CKTOTAL, CKMB, CKMBINDEX, TROPONINI in the last 168 hours. No results for input(s): PROBNP in the last 8760 hours. Coagulation Profile: Recent Labs  Lab 01/28/20 1035  INR 1.4*   Thyroid Function Tests: No results for input(s): TSH, T4TOTAL, FREET4, T3FREE, THYROIDAB in the last 72 hours. Lipid Profile: No results for input(s): CHOL, HDL, LDLCALC, TRIG, CHOLHDL, LDLDIRECT in the last 72 hours. Anemia Panel: No results for input(s): VITAMINB12, FOLATE, FERRITIN, TIBC, IRON, RETICCTPCT in the last 72 hours. Urine analysis:    Component Value Date/Time   COLORURINE YELLOW 01/22/2020 0418   APPEARANCEUR CLEAR 01/22/2020 0418   LABSPEC 1.040 (H) 01/22/2020 0418   PHURINE 5.0 01/22/2020 0418   GLUCOSEU NEGATIVE 01/22/2020 0418   HGBUR NEGATIVE 01/22/2020 0418   BILIRUBINUR NEGATIVE 01/22/2020 0418   KETONESUR NEGATIVE 01/22/2020 0418   PROTEINUR NEGATIVE  01/22/2020 0418   NITRITE NEGATIVE 01/22/2020 0418   LEUKOCYTESUR NEGATIVE 01/22/2020 0418   Sepsis Labs: Invalid input(s): PROCALCITONIN, [place]  Microbiology: Recent Results (from the past 240 hour(s))  Aerobic/Anaerobic Culture (surgical/deep wound)     Status: None   Collection Time: 01/28/20 12:55 PM   Specimen: Abscess  Result Value Ref Range Status   Specimen Description ABSCESS LIVER  Final   Special Requests Normal  Final   Gram Stain   Final  ABUNDANT WBC PRESENT,BOTH PMN AND MONONUCLEAR ABUNDANT GRAM POSITIVE COCCI    Culture   Final    ABUNDANT STREPTOCOCCUS INTERMEDIUS NO ANAEROBES ISOLATED Performed at [facility], [address] [place], [place] [address]    Report Status 02/02/2020 FINAL  Final   Organism ID, Bacteria STREPTOCOCCUS INTERMEDIUS  Final      Susceptibility   Streptococcus intermedius - MIC*    PENICILLIN <=0.06 SENSITIVE Sensitive     CEFTRIAXONE <=0.12 SENSITIVE Sensitive     ERYTHROMYCIN <=0.12 SENSITIVE Sensitive     LEVOFLOXACIN 0.5 SENSITIVE Sensitive     VANCOMYCIN 0.5 SENSITIVE Sensitive     * ABUNDANT STREPTOCOCCUS INTERMEDIUS  Culture, blood (routine x 2)     Status: None (Preliminary result)   Collection Time: 01/28/20  6:02 PM   Specimen: BLOOD  Result Value Ref Range Status   Specimen Description BLOOD RIGHT ANTECUBITAL  Final   Special Requests   Final    BOTTLES DRAWN AEROBIC AND ANAEROBIC Blood Culture adequate volume   Culture   Final    NO GROWTH 4 DAYS Performed at [facility], [place] [address] [place], [place] [address]    Report Status PENDING  Incomplete  Culture, blood (routine x 2)     Status: None (Preliminary result)   Collection Time: 01/28/20  6:17 PM   Specimen: BLOOD RIGHT HAND  Result Value Ref Range Status   Specimen Description BLOOD RIGHT HAND  Final   Special Requests   Final    BOTTLES DRAWN AEROBIC ONLY Blood Culture adequate volume   Culture   Final    NO GROWTH 4  DAYS Performed at [facility], [place] [address], [place], [place] [address]    Report Status PENDING  Incomplete    Radiology Studies: No results found.   Anjel Perfetti T. [place]  If 7PM-7AM, please contact night-coverage [email or web address] Password [facility] 02/02/2020, 12:18 PM low despite above

## 2020-02-02 NOTE — Consult Note (Signed)
Consultation Note Date: 02/02/2020   Patient Name: Kevin [name]  DOB: 1951-02-06  MRN: [id number]  Age / Sex: 69 y.o., male  PCP: Patient, No Pcp Per Referring Physician: Mercy Riding, MD  Reason for Consultation: Establishing goals of care and Psychosocial/spiritual support  HPI/Patient Profile: 69 y.o. male   admitted on 01/21/2020 with difficulty eating, abdominal pain,  Unintentional weight loss, black stool .    CT abd/ pelvis IMPRESSION: 1. Large necrotic mass centered in the posterior wall of the stomach, consistent with gastric adenocarcinoma. Endoscopy is recommended for further evaluation. 2. Irregular hypodense mass right lobe liver, worrisome for metastatic disease.  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 He has been evaluated by general surgery and this is not resectable. They have recommended neoadjuvant Gleevec followed by possible resection. He also has a liver mass which could represent metastasis versus hemangioma.   Patient faces treatment option decisions, advanced directive decisions and anticipatory care needs.   Clinical Assessment and Goals of Care:  This NP Wadie Lessen reviewed medical records, received report from team.   Originally I meet with Kevin [name] this morning along with Dr Cyndia Skeeters for initial conservation, interpretor utilized.  Later this afternoon I meet at the patient's bedside along with his wife and daughter/[name] (along with  interpreter)  to discuss diagnosis, prognosis, GOC, EOL wishes disposition and options.  Created space and opportunity for patient and his family to explore their thoughts and feelings regarding current medical situation.  Communication is a barrier for this family.  All tell me they do not speak the other's language.  Wife and daughter do not speak or understand Spanish and patient does not speak or understand  [place].   Kevin [name] has little insight into his diagnosis and the recommended treatment plan. However it is clear that he wishes to move forward with medical interventions hoping to prolong life.  Concept of Hospice and Palliative Care were discussed  A detailed discussion was had today regarding advanced directives.  Concepts specific to code status, artifical feeding and hydration, continued IV antibiotics and rehospitalization was had.  The difference between a aggressive medical intervention path  and a palliative comfort care path for this patient at this time was had.  Values and goals of care important to patient and family were attempted to be elicited.  Questions and concerns addressed.   Family encouraged to call with questions or concerns.    PMT will continue to support holistically.  I encouraged wife to contact me OP with questions or concerns   No documented HPOA or AD  NEXT OF KIN    SUMMARY OF RECOMMENDATIONS    Code Status/Advance Care Planning:  DNR-documented today    Palliative Prophylaxis:   Frequent Pain Assessment  Additional Recommendations (Limitations, Scope, Preferences):  Full Scope Treatment   Family are ad ament that they "can afford the medications"  Psycho-social/Spiritual:   Desire for further Chaplaincy support:no  Additional Recommendations: Education on [facility] dropped  off      Patient is not interested in hospice at this time but absolutely  needs OP [facility]   Prognosis:   Unable to determine  Discharge Planning: I will contact the [facility] to verify f/u appointment.  Wife tells me she has "tried and tried" but never gets a call back.   Home with Palliative Services      Primary Diagnoses: Present on Admission: . Anemia . Gastric mass . Weight loss, unintentional . Tobacco abuse   I have reviewed the medical record, interviewed the patient and family, and examined the patient. The following aspects are  pertinent.  History reviewed. No pertinent past medical history. Social History   Socioeconomic History  . Marital status: Married    Spouse name: Not on file  . Number of children: 3  . Years of education: Not on file  . Highest education level: Not on file  Occupational History  . Not on file  Tobacco Use  . Smoking status: Current Every Day Smoker    Types: Cigarettes  . Smokeless tobacco: Never Used  . Tobacco comment: 01/27/2020 - Pt denied smoking cigarettes  Substance and Sexual Activity  . Alcohol use: Not Currently  . Drug use: Not on file  . Sexual activity: Not on file  Other Topics Concern  . Not on file  Social History Narrative  . Not on file   Social Determinants of Health   Financial Resource Strain:   . Difficulty of Paying Living Expenses: Not on file  Food Insecurity:   . Worried About Charity fundraiser in the Last Year: Not on file  . Ran Out of Food in the Last Year: Not on file  Transportation Needs:   . Lack of Transportation (Medical): Not on file  . Lack of Transportation (Non-Medical): Not on file  Physical Activity:   . Days of Exercise per Week: Not on file  . Minutes of Exercise per Session: Not on file  Stress:   . Feeling of Stress : Not on file  Social Connections:   . Frequency of Communication with Friends and Family: Not on file  . Frequency of Social Gatherings with Friends and Family: Not on file  . Attends Religious Services: Not on file  . Active Member of Clubs or Organizations: Not on file  . Attends Archivist Meetings: Not on file  . Marital Status: Not on file   Family History  Family history unknown: Yes   Scheduled Meds: . sodium chloride   Intravenous Once  . feeding supplement  1 Container Oral TID BM  . feeding supplement (PRO-STAT SUGAR FREE 64)  30 mL Oral BID  . ferrous sulfate  325 mg Oral BID WC  . imatinib  400 mg Oral Q breakfast  . influenza vaccine adjuvanted  0.5 mL Intramuscular  Tomorrow-1000  . mirtazapine  7.5 mg Oral QHS  . multivitamin with minerals  1 tablet Oral Daily  . nicotine  14 mg Transdermal Daily  . pantoprazole  40 mg Oral BID  . pneumococcal 23 valent vaccine  0.5 mL Intramuscular Tomorrow-1000  . saccharomyces boulardii  250 mg Oral BID  . sodium chloride flush  5 mL Intracatheter Q8H   Continuous Infusions: . sodium chloride 250 mL (01/30/20 1533)  . ampicillin-sulbactam (UNASYN) IV 3 g (02/02/20 0640)   PRN Meds:.sodium chloride, acetaminophen **OR** acetaminophen, morphine injection, ondansetron (ZOFRAN) IV, polyethylene glycol, senna-docusate Medications Prior to Admission:  Prior to Admission medications  Medication Sig Start Date End Date Taking? Authorizing Provider  bismuth subsalicylate (PEPTO-BISMOL) 262 MG/15ML suspension Take 30 mLs by mouth 4 (four) times daily -  before meals and at bedtime. 01/28/20   Mercy Riding, MD  feeding supplement, ENSURE ENLIVE, (ENSURE ENLIVE) LIQD Take 237 mLs by mouth 3 (three) times daily between meals. 01/28/20 02/27/20  Mercy Riding, MD  ferrous sulfate 325 (65 FE) MG tablet Take 1 tablet (325 mg total) by mouth 2 (two) times daily with a meal. 01/28/20   Mercy Riding, MD  imatinib (GLEEVEC) 400 MG tablet Take 1 tablet (400 mg total) by mouth daily. Take with meals and large glass of water.Caution:Chemotherapy. 01/27/20   Ladell Pier, MD  metroNIDAZOLE (FLAGYL) 250 MG tablet Take 1 tablet (250 mg total) by mouth 4 (four) times daily for 10 days. 01/28/20 02/07/20  Mercy Riding, MD  Multiple Vitamins-Minerals (MULTIVITAMIN WITH MINERALS) tablet Take 1 tablet by mouth daily. 01/28/20 07/26/20  Mercy Riding, MD  nicotine (NICODERM CQ - DOSED IN MG/24 HOURS) 14 mg/24hr patch Place 1 patch (14 mg total) onto the skin daily. 01/28/20   Mercy Riding, MD  ondansetron (ZOFRAN) 4 MG tablet Take 1 tablet (4 mg total) by mouth daily as needed for nausea or vomiting. 01/28/20 01/27/21  Mercy Riding, MD  pantoprazole  (PROTONIX) 40 MG tablet Take 1 tablet (40 mg total) by mouth 2 (two) times daily for 10 days, THEN 1 tablet (40 mg total) daily for 20 days. 01/28/20 02/27/20  Mercy Riding, MD  senna-docusate (SENOKOT S) 8.6-50 MG tablet Take 1 tablet by mouth daily. 01/28/20   Mercy Riding, MD  tetracycline (SUMYCIN) 250 MG capsule Take 1 capsule (250 mg total) by mouth 4 (four) times daily. 01/28/20   Mercy Riding, MD   No Known Allergies Review of Systems  Neurological: Positive for weakness.    Physical Exam Constitutional:      Appearance: He is cachectic. He is ill-appearing.  Cardiovascular:     Rate and Rhythm: Normal rate.  Pulmonary:     Breath sounds: Decreased air movement present.  Skin:    General: Skin is warm and dry.  Neurological:     Mental Status: He is oriented to person, place, and time. He is lethargic.     Vital Signs: BP 102/62 (BP Location: Left Arm)   Pulse 87   Temp 98.9 F (37.2 C) (Oral)   Resp 18   Ht 5\' 6"  (1.676 m)   Wt 55.7 kg Comment: scale a  SpO2 96%   BMI 19.82 kg/m  Pain Scale: 0-10   Pain Score: 0-No pain   SpO2: SpO2: 96 % O2 Device:SpO2: 96 % O2 Flow Rate: .   IO: Intake/output summary:   Intake/Output Summary (Last 24 hours) at 02/02/2020 0957 Last data filed at 02/02/2020 0818 Gross per 24 hour  Intake 625.54 ml  Output 230 ml  Net 395.54 ml    LBM: Last BM Date: 01/31/20 Baseline Weight: Weight: 57.7 kg Most recent weight: Weight: 55.7 kg(scale a)     Palliative Assessment/Data: 50 %   Discussed with Dr Cyndia Skeeters  Time In:  1600 Time Out: 1730 Time Total: 90 minutes Greater than 50%  of this time was spent counseling and coordinating care related to the above assessment and plan.  Signed by: Wadie Lessen, NP   Please contact Palliative Medicine Team phone at [phone] for questions and concerns.  For individual provider:  See Amion

## 2020-02-02 NOTE — Progress Notes (Signed)
Referring Physician(s): Mercy Riding, MD  Supervising Physician: Corrie Mckusick  Patient Status:  [facility] - In-pt  Chief Complaint: Follow up hepatic abscess drain placed 01/28/20 by Dr. Annamaria Boots  Subjective:  Patient laying in bed, alert but does not interact much during exam. Lunch tray at bedside with nothing eaten. Denies pain when asked in Spanish.  Allergies: Patient has no known allergies.  Medications: Prior to Admission medications   Medication Sig Start Date End Date Taking? Authorizing Provider  bismuth subsalicylate (PEPTO-BISMOL) 262 MG/15ML suspension Take 30 mLs by mouth 4 (four) times daily -  before meals and at bedtime. 01/28/20   Mercy Riding, MD  feeding supplement, ENSURE ENLIVE, (ENSURE ENLIVE) LIQD Take 237 mLs by mouth 3 (three) times daily between meals. 01/28/20 02/27/20  Mercy Riding, MD  ferrous sulfate 325 (65 FE) MG tablet Take 1 tablet (325 mg total) by mouth 2 (two) times daily with a meal. 01/28/20   Mercy Riding, MD  imatinib (GLEEVEC) 400 MG tablet Take 1 tablet (400 mg total) by mouth daily. Take with meals and large glass of water.Caution:Chemotherapy. 01/27/20   Ladell Pier, MD  metroNIDAZOLE (FLAGYL) 250 MG tablet Take 1 tablet (250 mg total) by mouth 4 (four) times daily for 10 days. 01/28/20 02/07/20  Mercy Riding, MD  Multiple Vitamins-Minerals (MULTIVITAMIN WITH MINERALS) tablet Take 1 tablet by mouth daily. 01/28/20 07/26/20  Mercy Riding, MD  nicotine (NICODERM CQ - DOSED IN MG/24 HOURS) 14 mg/24hr patch Place 1 patch (14 mg total) onto the skin daily. 01/28/20   Mercy Riding, MD  ondansetron (ZOFRAN) 4 MG tablet Take 1 tablet (4 mg total) by mouth daily as needed for nausea or vomiting. 01/28/20 01/27/21  Mercy Riding, MD  pantoprazole (PROTONIX) 40 MG tablet Take 1 tablet (40 mg total) by mouth 2 (two) times daily for 10 days, THEN 1 tablet (40 mg total) daily for 20 days. 01/28/20 02/27/20  Mercy Riding, MD  senna-docusate (SENOKOT S) 8.6-50 MG  tablet Take 1 tablet by mouth daily. 01/28/20   Mercy Riding, MD  tetracycline (SUMYCIN) 250 MG capsule Take 1 capsule (250 mg total) by mouth 4 (four) times daily. 01/28/20   Mercy Riding, MD     Vital Signs: BP 100/63 (BP Location: Left Arm)   Pulse 85   Temp 98.9 F (37.2 C) (Oral)   Resp 20   Ht 5\' 6"  (1.676 m)   Wt 122 lb 12.8 oz (55.7 kg) Comment: scale a  SpO2 98%   BMI 19.82 kg/m   Physical Exam Vitals and nursing note reviewed.  Constitutional:      Appearance: He is ill-appearing.  HENT:     Head: Normocephalic.  Cardiovascular:     Rate and Rhythm: Normal rate.  Pulmonary:     Effort: Pulmonary effort is normal.  Abdominal:     General: There is no distension.     Palpations: Abdomen is soft.     Tenderness: There is no abdominal tenderness.     Comments: (+) RUQ drain to suction with ~10 cc of cloudy blood tinged output. Drain flushes and aspirates easily. Insertion site unremarkable. Suture and stat lock in tact. Flushing/palpation does not appear to illicit pain however patient minimally interactive on exam.  Skin:    General: Skin is warm and dry.  Neurological:     Mental Status: He is alert. Mental status is at baseline.  Imaging: No results found.  Labs:  CBC: Recent Labs    01/30/20 0400 01/31/20 0323 02/01/20 0553 02/02/20 0437  WBC 8.9 11.3* 11.1* 9.0  HGB 7.6* 7.9* 7.4* 7.5*  HCT 25.4* 26.3* 24.7* 25.5*  PLT 469* 599* 563* 625*    COAGS: Recent Labs    01/28/20 1035  INR 1.4*    BMP: Recent Labs    01/30/20 0400 01/31/20 0323 02/01/20 0553 02/02/20 0437  NA 133* 133* 130* 135  K 4.1 4.1 4.2 4.3  CL 101 99 98 102  CO2 24 22 22 23   GLUCOSE 127* 125* 123* 103*  BUN 18 13 15 21   CALCIUM 7.1* 7.1* 7.1* 7.2*  CREATININE 1.00 1.03 0.97 0.85  GFRNONAA >60 >60 >60 >60  GFRAA >60 >60 >60 >60    LIVER FUNCTION TESTS: Recent Labs    01/29/20 0532 01/29/20 0532 01/30/20 0400 01/31/20 0323 02/01/20 0553 02/02/20  0437  BILITOT 0.8  --   --  0.6 0.4 0.6  AST 56*  --   --  74* 66* 60*  ALT 45*  --   --  50* 50* 50*  ALKPHOS 162*  --   --  187* 165* 165*  PROT 6.0*  --   --  5.9* 5.6* 5.8*  ALBUMIN 1.1*  1.1*   < > <1.0* 1.0*  1.0* <1.0*  <1.0* <1.0*  <1.0*   < > = values in this interval not displayed.    Assessment and Plan:  69 y/o M s/p hepatic abscess drain placement 01/28/20 in IR seen today for drain follow up.  Patient minimally interactive today, denies pain when asked in Spanish - does not volunteer further history. Affect very flat, lunch tray at bedside with nothing eaten. Drain flushes/aspirates easily on my exam, no leakage or bleeding noted. No apparently tenderness to palpation or with flushing. Per I/O 30 cc output in last 24H with ~10 cc cloudy, blood tinged output on my exam today. Cx aspirate (+) for strep intermedius - currently receiving Unasyn per primary team. Tmax 98.9, WBC improved to 9.0, hgb 7.5, plt 625, albumin <1.0, LFTs elevated but stable.   Continue TID flushes with 5 cc NS, record output Qshift, dressing changes QD, call IR if difficulty flushing drain or sudden change in output amount or appearance. Discharge planning per primary service - if output remains significant he will go home with the drain and come back to see [place] in clinic approximately 10 days after discharge for follow up imaging/possible injection. If output <10 mL in 24H (not including flush) while inpatient will plan for repeat imaging prior to d/c.   I have placed follow up orders/instructions in patient's AVS should he be discharged prior to our next follow up - he will need to flush the drain QD with 5 cc NS (will need rx for this at d/c), record output QD, dressing changes every 2-3 days or earlier if soiled. May shower with drain as long as it is covered with a water tight dressing (may use saran wrap or similar).   IR will continue to follow - please call with questions or concerns.   Electronically Signed: Joaquim Nam, PA-C 02/02/2020, 1:32 PM   I spent a total of 15 Minutes at the the patient's bedside AND on the patient's hospital floor or unit, greater than 50% of which was counseling/coordinating care for hepatic abscess follow up.

## 2020-02-02 NOTE — Plan of Care (Signed)
  Problem: Nutrition: Goal: Adequate nutrition will be maintained Outcome: Progressing   Problem: Safety: Goal: Ability to remain free from injury will improve Outcome: Progressing   

## 2020-02-02 NOTE — Progress Notes (Signed)
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  Ongoing  GOAL:   Patient will meet greater than or equal to 90% of their needs  Progressing   MONITOR:   PO intake, Supplement acceptance, Labs, Weight trends, Skin, I & O's  REASON FOR ASSESSMENT:   Consult Assessment of nutrition requirement/status  ASSESSMENT:   69 year old male with no pertinent past medical history presented with loss of appetite, weight loss, and black stools. CT abdomen pelvis showed large necrotic mass centered in posterior wall of stomach consistent gastric adenocarcinoma and irregular hypodense mass in right lobe of liver concerning for metastatic disease.  2/15- s/p upper GI- revealed malignant gastric tumor (biopsied) 2/16- advanced to regular diet 2/18- Gleevec initiated by oncology 2/20- s/p drain placement for hepatic abscess (130 ml pus aspirated)  Reviewed I/O's: +396 ml x 24 hours and +1.3 L since admission  UOP: 200 ml x 24 hours  Drain output: 30 ml x 24 hours  Pt remains with poor appetite, however, improving since early this week. Noted meal completion 15-75%. Pt is taking Porstat supplements, however, with variable acceptance of Boost Breeze.   Palliative care consult pending for goals of care discussions. Per TOC notes, pt wife has contacted hospice.   Labs reviewed.   Diet Order:   Diet Order            Diet regular  Room service appropriate? Yes; Fluid consistency: Thin  Diet effective now        Diet general              EDUCATION NEEDS:   No education needs have been identified at this time  Skin:  Skin Assessment: Reviewed RN Assessment  Last BM:  02/02/20  Height:   Ht Readings from Last 1 Encounters:  01/22/20 5\' 6"  (1.676 m)    Weight:   Wt Readings from Last 1 Encounters:  02/02/20 55.7 kg    Ideal Body Weight:  64.6 kg  BMI:  Body mass index is 19.82 kg/m.  Estimated Nutritional Needs:   Kcal:  1900-2100  Protein:  95-110 grams  Fluid:  > 1.9 L    Loistine Chance, RD, LDN, [place] Registered Dietitian II Certified Diabetes Care and Education Specialist Please refer to [facility] for RD and/or RD on-call/weekend/after hours pager

## 2020-02-03 ENCOUNTER — Inpatient Hospital Stay: Payer: Self-pay

## 2020-02-03 ENCOUNTER — Encounter: Payer: Self-pay | Admitting: General Practice

## 2020-02-03 ENCOUNTER — Other Ambulatory Visit: Payer: Self-pay | Admitting: Oncology

## 2020-02-03 DIAGNOSIS — C49A2 Gastrointestinal stromal tumor of stomach: Secondary | ICD-10-CM

## 2020-02-03 DIAGNOSIS — Z515 Encounter for palliative care: Secondary | ICD-10-CM

## 2020-02-03 DIAGNOSIS — R4589 Other symptoms and signs involving emotional state: Secondary | ICD-10-CM

## 2020-02-03 DIAGNOSIS — R748 Abnormal levels of other serum enzymes: Secondary | ICD-10-CM

## 2020-02-03 DIAGNOSIS — Z66 Do not resuscitate: Secondary | ICD-10-CM

## 2020-02-03 DIAGNOSIS — D509 Iron deficiency anemia, unspecified: Secondary | ICD-10-CM

## 2020-02-03 LAB — RENAL FUNCTION PANEL
Albumin: <1 g/dL — ABNORMAL LOW (ref 3.5–5.0)
Anion gap: 9 (ref 5–15)
BUN: 17 mg/dL (ref 8–23)
CO2: 24 mmol/L (ref 22–32)
Calcium: 7.2 mg/dL — ABNORMAL LOW (ref 8.9–10.3)
Chloride: 103 mmol/L (ref 98–111)
Creatinine, Ser: 0.79 mg/dL (ref 0.61–1.24)
GFR calc Af Amer: >60 mL/min (ref >60)
GFR calc non Af Amer: >60 mL/min (ref >60)
Glucose, Bld: 107 mg/dL — ABNORMAL HIGH (ref 70–99)
Phosphorus: 3.4 mg/dL (ref 2.5–4.6)
Potassium: 3.8 mmol/L (ref 3.5–5.1)
Sodium: 136 mmol/L (ref 135–145)

## 2020-02-03 LAB — CBC
HCT: 25 % — ABNORMAL LOW (ref 39.0–52.0)
Hemoglobin: 7.3 g/dL — ABNORMAL LOW (ref 13.0–17.0)
MCH: 25.1 pg — ABNORMAL LOW (ref 26.0–34.0)
MCHC: 29.2 g/dL — ABNORMAL LOW (ref 30.0–36.0)
MCV: 85.9 fL (ref 80.0–100.0)
Platelets: 582 10*3/uL — ABNORMAL HIGH (ref 150–400)
RBC: 2.91 MIL/uL — ABNORMAL LOW (ref 4.22–5.81)
RDW: 19.4 % — ABNORMAL HIGH (ref 11.5–15.5)
WBC: 7.1 10*3/uL (ref 4.0–10.5)
nRBC: 0 % (ref 0.0–0.2)

## 2020-02-03 LAB — MAGNESIUM: Magnesium: 2.1 mg/dL (ref 1.7–2.4)

## 2020-02-03 MED ORDER — MIRTAZAPINE 15 MG PO TABS: 15.0000 mg | ORAL_TABLET | Freq: Every day | ORAL | 1 refills | Status: DC

## 2020-02-03 MED ORDER — ONDANSETRON HCL 4 MG PO TABS: 4.0000 mg | ORAL_TABLET | Freq: Three times a day (TID) | ORAL | 0 refills | Status: AC | PRN

## 2020-02-03 MED ORDER — PEPTO-BISMOL 524 MG/30ML PO SUSP: 30.0000 mL | Freq: Three times a day (TID) | ORAL | 0 refills | Status: DC

## 2020-02-03 MED ORDER — TETRACYCLINE HCL 250 MG PO CAPS: 250.0000 mg | ORAL_CAPSULE | Freq: Four times a day (QID) | ORAL | 0 refills | Status: DC

## 2020-02-03 MED ORDER — PANTOPRAZOLE SODIUM 40 MG PO TBEC: DELAYED_RELEASE_TABLET | ORAL | 0 refills | Status: DC

## 2020-02-03 MED ORDER — SACCHAROMYCES BOULARDII 250 MG PO CAPS: 250.0000 mg | ORAL_CAPSULE | Freq: Two times a day (BID) | ORAL | 0 refills | Status: DC

## 2020-02-03 MED ORDER — AMOXICILLIN-POT CLAVULANATE 875-125 MG PO TABS: 1.0000 | ORAL_TABLET | Freq: Two times a day (BID) | ORAL | 0 refills | Status: AC

## 2020-02-03 MED ORDER — METRONIDAZOLE 250 MG PO TABS: 250.0000 mg | ORAL_TABLET | Freq: Four times a day (QID) | ORAL | 0 refills | Status: AC

## 2020-02-03 MED FILL — AMOX-CLAV 875-125 MG TABLET: 875-125 | 10 days supply | Qty: 20 | Fill #0

## 2020-02-03 MED FILL — IMATINIB MESYLATE 400 MG TA: 400 | 30 days supply | Qty: 30 | Fill #0

## 2020-02-03 MED FILL — MIRTAZAPINE 15 MG TABLET: 15 | 30 days supply | Qty: 30 | Fill #0

## 2020-02-03 NOTE — Progress Notes (Signed)
Patient refused morning lab draw, will try again later.

## 2020-02-03 NOTE — Progress Notes (Signed)
Pt belongings returned from security, in plans for discharge. Spoke with pt regarding tube maintanence and she stated he wanted his wife to learn how to flush and drain, also demonstrated and educated how to do so once home

## 2020-02-03 NOTE — Progress Notes (Signed)
Received a call back from Kevin [name] she will be able to pick up her husband after work today. Instructed her to please pick up Medication  [place] first and then to pick up her husband for education

## 2020-02-03 NOTE — Progress Notes (Signed)
[place] CSW Progress Note  Request from Merceda Elks, oncologist desk nurse, to determine if patient has been assisted w application for Medicaid while inpatient at [facility]  Call to [facility], they are in process of screening him and will call w update when available.  Edwyna Shell, LCSW Clinical Social Worker Phone:  [phone] Cell:  [phone]

## 2020-02-03 NOTE — Progress Notes (Signed)
[place] 3E-05 [facility] RN Note  Referral received from Tomi Bamberger for [facility]. We are reaching out to Dr. Ammie Dalton to notify. A member of our Palliative Care Team will reach out to the patient and family to establish a visit once the patient has discharged from the hospital.  Should you have any questions or concerns, please call [phone].  Thank you for this referral.  Margaretmary Eddy, RN, BSN [place] [phone]  [place] are on [place].

## 2020-02-03 NOTE — Progress Notes (Signed)
Physical Therapy Treatment Patient Details Name: Kevin [name] MRN: [id number] DOB: 1950-12-23 Today's Date: 02/03/2020    History of Present Illness 69 yo male admitted to ED on 2/12 with significant weight loss, loss of appetite. 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 PMH includes tobacco use.    PT Comments    Pt was able to ambulate 100' with HHA and min A for 3 LOB with turns.  Required cues for posture and increased BOS with gait.  Recommended and educated on use of RW for ambulation or having Min guard from family.  Pt was able to perform toileting ADLs with assist for line management. Noted plan is for pt to return home with pallative services -  Changed recommendation to HHPT and RW vs outpatient.  Follow Up Recommendations  Supervision for mobility/OOB;Home health PT     Equipment Recommendations  Rolling walker with 5" wheels    Recommendations for Other Services       Precautions / Restrictions Precautions Precautions: Fall Precaution Comments: R JP drain    Mobility  Bed Mobility Overal bed mobility: Needs Assistance Bed Mobility: Sidelying to Sit   Sidelying to sit: Min assist;HOB elevated       General bed mobility comments: min A for trunk elevation  Transfers Overall transfer level: Needs assistance Equipment used: 1 person hand held assist Transfers: Sit to/from Stand Sit to Stand: Min guard         General transfer comment: min guard for safety  Ambulation/Gait Ambulation/Gait assistance: Min assist Gait Distance (Feet): 120 Feet Assistive device: 1 person hand held assist Gait Pattern/deviations: Decreased stride length;Step-through pattern;Narrow base of support;Scissoring Gait velocity: decr   General Gait Details: Cued for increased BOS; occasional scissoring with turns - cues to correct; pt  with forward head and looking down - cued for posture but needed repeated cues.  Ambulated in room in multiple circles so that could stay with intrepretor - had 3 LOB with turns requiring min A   Stairs             Wheelchair Mobility    Modified Rankin (Stroke Patients Only)       Balance Overall balance assessment: Needs assistance Sitting-balance support: No upper extremity supported;Feet supported Sitting balance-Leahy Scale: Good     Standing balance support: Single extremity supported;During functional activity Standing balance-Leahy Scale: Fair                              Cognition Arousal/Alertness: Awake/alert Behavior During Therapy: Flat affect Overall Cognitive Status: Within Functional Limits for tasks assessed                                 General Comments: Pt responds appropriately to questions, answers PT questions with very few words.  Used intrepretor Mariann ID [id number]      Exercises      General Comments General comments (skin integrity, edema, etc.): -No dizziness or lightheadedness reported;  Pt performed toielting ADLs with min guard for JP drain management; wife present at end of session.  Recommended use of RW or having family close by with mobility due to unsteadiness      Pertinent Vitals/Pain Pain Assessment: No/denies pain    Home Living  Prior Function            PT Goals (current goals can now be found in the care plan section) Acute Rehab PT Goals Patient Stated Goal: go home Progress towards PT goals: Progressing toward goals    Frequency    Min 3X/week      PT Plan Discharge plan needs to be updated;Equipment recommendations need to be updated    Co-evaluation              AM-PAC PT "6 Clicks" Mobility   Outcome Measure  Help needed turning from your back to your side while in a flat bed without using bedrails?: None Help needed moving from lying  on your back to sitting on the side of a flat bed without using bedrails?: A Little Help needed moving to and from a bed to a chair (including a wheelchair)?: None Help needed standing up from a chair using your arms (e.g., wheelchair or bedside chair)?: None Help needed to walk in hospital room?: A Little Help needed climbing 3-5 steps with a railing? : A Little 6 Click Score: 21    End of Session Equipment Utilized During Treatment: Gait belt Activity Tolerance: Patient tolerated treatment well Patient left: in bed;with call bell/phone within reach;with family/visitor present(pt wanted to sit EOB, wife beside him, discharging shortly - notified RN wife present) Nurse Communication: Mobility status PT Visit Diagnosis: Muscle weakness (generalized) (M62.81);Difficulty in walking, not elsewhere classified (R26.2)     Time: [id number] PT Time Calculation (min) (ACUTE ONLY): 23 min  Charges:  $Gait Training: 8-22 mins $Therapeutic Activity: 8-22 mins                     Maggie Font, PT Acute Rehab Services Pager [phone] [facility] [phone] [facility] [phone]    Karlton Lemon 02/03/2020, 5:17 PM

## 2020-02-03 NOTE — Progress Notes (Signed)
Referring Physician(s): Dr Bettina Gavia  Supervising Physician: Corrie Mckusick  Patient Status:  [facility] - In-pt  Chief Complaint:  Hepatic abscess  Subjective:  Hepatic abscess drain placed 01/28/20 by Dr. Annamaria Boots Up in bed eating some lunch To go home today per notes  Allergies: Patient has no known allergies.  Medications: Prior to Admission medications   Medication Sig Start Date End Date Taking? Authorizing Provider  amoxicillin-clavulanate (AUGMENTIN) 875-125 MG tablet Take 1 tablet by mouth 2 (two) times daily for 10 days. 02/03/20 02/13/20  Mercy Riding, MD  bismuth subsalicylate (PEPTO-BISMOL) 262 MG/15ML suspension Take 30 mLs by mouth 4 (four) times daily -  before meals and at bedtime. 02/13/20   Mercy Riding, MD  feeding supplement, ENSURE ENLIVE, (ENSURE ENLIVE) LIQD Take 237 mLs by mouth 3 (three) times daily between meals. 01/28/20 02/27/20  Mercy Riding, MD  ferrous sulfate 325 (65 FE) MG tablet Take 1 tablet (325 mg total) by mouth 2 (two) times daily with a meal. 01/28/20   Mercy Riding, MD  imatinib (GLEEVEC) 400 MG tablet Take 1 tablet (400 mg total) by mouth daily. Take with meals and large glass of water.Caution:Chemotherapy. 01/27/20   Ladell Pier, MD  metroNIDAZOLE (FLAGYL) 250 MG tablet Take 1 tablet (250 mg total) by mouth 4 (four) times daily for 10 days. 02/13/20 02/23/20  Mercy Riding, MD  mirtazapine (REMERON) 15 MG tablet Take 1 tablet (15 mg total) by mouth at bedtime. 02/03/20   Mercy Riding, MD  Multiple Vitamins-Minerals (MULTIVITAMIN WITH MINERALS) tablet Take 1 tablet by mouth daily. 01/28/20 07/26/20  Mercy Riding, MD  ondansetron (ZOFRAN) 4 MG tablet Take 1 tablet (4 mg total) by mouth every 8 (eight) hours as needed for up to 10 days for nausea or vomiting. 02/13/20 02/23/20  Mercy Riding, MD  pantoprazole (PROTONIX) 40 MG tablet Take 1 tablet (40 mg total) by mouth 2 (two) times daily for 10 days, THEN 1 tablet (40 mg total) daily for 20 days. 02/13/20  03/14/20  Mercy Riding, MD  saccharomyces boulardii (FLORASTOR) 250 MG capsule Take 1 capsule (250 mg total) by mouth 2 (two) times daily. 02/03/20   Mercy Riding, MD  senna-docusate (SENOKOT S) 8.6-50 MG tablet Take 1 tablet by mouth daily. 01/28/20   Mercy Riding, MD  tetracycline (SUMYCIN) 250 MG capsule Take 1 capsule (250 mg total) by mouth 4 (four) times daily. 02/13/20   Mercy Riding, MD     Vital Signs: BP 108/66 (BP Location: Left Arm)   Pulse 87   Temp 98.3 F (36.8 C) (Oral)   Resp 18   Ht 5\' 6"  (1.676 m)   Wt 122 lb 8 oz (55.6 kg)   SpO2 98%   BMI 19.77 kg/m   Physical Exam Vitals reviewed.  Skin:    General: Skin is warm and dry.     Comments: Site is clean and dry NT no bleeding OP blood tinged 35 cc yesterday 10 cc in JP 02/02/2020 FINAL   Organism ID, Bacteria STREPTOCOCCUS INTERMEDIUS    Neurological:     Mental Status: He is alert.     Imaging: No results found.  Labs:  CBC: Recent Labs    01/31/20 0323 02/01/20 0553 02/02/20 0437 02/03/20 0736  WBC 11.3* 11.1* 9.0 7.1  HGB 7.9* 7.4* 7.5* 7.3*  HCT 26.3* 24.7* 25.5* 25.0*  PLT 599* 563* 625* 582*    COAGS: Recent Labs  01/28/20 1035  INR 1.4*    BMP: Recent Labs    01/31/20 0323 02/01/20 0553 02/02/20 0437 02/03/20 0736  NA 133* 130* 135 136  K 4.1 4.2 4.3 3.8  CL 99 98 102 103  CO2 22 22 23 24   GLUCOSE 125* 123* 103* 107*  BUN 13 15 21 17   CALCIUM 7.1* 7.1* 7.2* 7.2*  CREATININE 1.03 0.97 0.85 0.79  GFRNONAA >60 >60 >60 >60  GFRAA >60 >60 >60 >60    LIVER FUNCTION TESTS: Recent Labs    01/29/20 0532 01/30/20 0400 01/31/20 0323 02/01/20 0553 02/02/20 0437 02/03/20 0736  BILITOT 0.8  --  0.6 0.4 0.6  --   AST 56*  --  74* 66* 60*  --   ALT 45*  --  50* 50* 50*  --   ALKPHOS 162*  --  187* 165* 165*  --   PROT 6.0*  --  5.9* 5.6* 5.8*  --   ALBUMIN 1.1*  1.1*   < > 1.0*  1.0* <1.0*  <1.0* <1.0*  <1.0* <1.0*   < > = values in this interval not  displayed.    Assessment and Plan:  Hepatic abscess Drain intact Will need to flush drain daily 5-10 cc sterile saline at home Record OP IR will call pt with date and time for follow up appt in OP Clinic   Electronically Signed: Lavonia Drafts, PA-C 02/03/2020, 1:34 PM   I spent a total of 15 Minutes at the the patient's bedside AND on the patient's hospital floor or unit, greater than 50% of which was counseling/coordinating care for hepatic abscess

## 2020-02-03 NOTE — Progress Notes (Signed)
Left a message with wife she needs to be educated on drain, flush and care. Waiting a call back

## 2020-02-03 NOTE — Progress Notes (Signed)
Patient ID: Kevin [name], male   DOB: August 29, 1951, 69 y.o.   MRN: [id number]  This NP spoke to Stella at [facility] regarding f/u visit for this patient.  Anne confirmed f/u appointment with Wendall Papa NP/ Dr Benay Spice       February 09, 2020 Wednesday         10:45 for labs         11:15 with provider  [facility] [place]      February 18, 2020           09:50 for f/u visit post hospitalization and to set up PCP  Patient is to pick up medicine at the [place]  I left a detailed message on Ms Thorley's cell phone as discussed yesterday   No charge  Wadie Lessen NP  Palliative Medicine Team Team Phone # [phone] Pager [phone]

## 2020-02-03 NOTE — TOC Progression Note (Addendum)
Transition of Care [facility]) - Progression Note    Patient Details  Name: Kevin [name] MRN: [id number] Date of Birth: [birth year]  Transition of Care [facility] CM/SW Contact  Zenon Mayo, RN Phone Number: 02/03/2020, 10:12 AM  Clinical Narrative:    NCM was informed this patient is for dc today, NCM made referral to [place] with [facility] for [facility] for charity, awaiting call back.  Patient will need HHRN for dressing change and drain flushes. Wife will need to be educated on doing this because [facility] will not come every day and with charity he will probably get just a couple of visits. Staff RN is calling wife to see if she can come in to learn how to do the dressing and the fushes.    1200 2/25- NCM spoke with wife, informed her that she will need to be here today to get teaching on the drain and she will need to go to [facility] first to pick up his medication and then come to [facility], she states she understands. She gets off work at 3:30 pm.  She also states she would like outpt palliative services with [facility], NCM made referral to Olivia Mackie with [employer] for outpt palliative services.    NCM received call from [place] with [facility], they are not able to take for [facility].  Will see if can get another agency.  NCM contacted [facility] to see if they could do LOG for [facility] for twice a week for two weeks. Awaiting to hear back. NCM heard back from [facility] from [employer], she states they can do the LOG for 4 RN visits for 150.00 per RN visit, for twice a week for 2 weeks with [facility] beginning on 2/26 and next visit on 01/10/27 and she will schedule the next visit with patient wife.          Expected Discharge Plan and Services           Expected Discharge Date: 02/03/20                                     Social Determinants of Health (SDOH) Interventions    Readmission Risk Interventions No flowsheet data found.

## 2020-02-03 NOTE — Discharge Summary (Addendum)
Physician Discharge Summary  Kevin [name] GDJ:[id number] DOB: 10-04-1951 DOA: 01/21/2020  PCP: Patient, No Pcp Per  Admit date: 01/21/2020 Discharge date: 02/03/2020  Admitted From: Home Disposition: Home  Recommendations for Outpatient Follow-up:  1. Follow ups as below. 2. Oncology to arrange outpatient follow-up. 3. Please obtain CBC/CMP/Mag at follow up 4. Please follow up on the following pending results: None  Home Health: [facility] RN for drain managment Equipment/Devices: None required  Discharge Condition: Stable CODE STATUS: DNR/DNI  Follow-up Information    [facility]. Go on 02/03/2020.   Why: '@8' :50am.  Please utilize on site pharmacy and cost with range from $4 - $10 per medication that you get filled. Contact information: [place] [address] [phone]       Stark Klein, MD. Call in 1 day(s).   Specialty: General Surgery Why: Call to schedule follow up appointment after discharge  Contact information: [place] [address] [phone]        Greggory Keen, MD Follow up.   Specialties: Interventional Radiology, Radiology Why: El programador de IR lo llamar con la fecha/hora de la cita (~ 10 das despus del procedimiento). Si tiene alguna pregunta o inquietud, llame antes de su cita de seguimiento. Contact information: [address] [place] [address] [phone]           Hospital Course: 69 year old Spanish male with history of tobacco use disorder presented to ED 2/12 with difficulty eating and unintentional weight loss of about 65 pounds.  Also had melanotic stool.  In ED, CT abdomen and pelvis revealed large necrotic mass in the posterior wall of the stomach and irregular hypodense mass in the right lobe of the liver concerning for metastatic disease.  Hgb 6.2 with normal MCV.   Patient received 2 units of packed red blood cells with  appropriate response.  Started on PPI.  GI consulted, and he had EGD and biopsy of the mass on 2/15.  Pathology consistent with gastric stromal tumor.  Also positive for H. pylori infection.  General surgery consulted and recommended neoadjuvant Gleevec prior to resection.  Oncology consulted and started [place], and ordered MRI liver which revealed complex multilocular 10.7 x 8.2 cm cystic right liver mass that has increased in size from his recent CT about a week ago.  IR consulted, and he had drainage of liver abscess and drain placement on 2/19.  He was a started on IV Zosyn after discussion with ID, Dr. Tommy Medal. Of note, patient had fever on 12/17 and 2/18.  Liver abscess culture grew Streptococcus intermedius.  Antibiotic escalated to IV penicillin G based on culture sensitivity.  However, patient spiked another fever on 01/31/2020.  Antibiotics changed to IV Unasyn, and he was discharged on Augmentin for 10 more days.   Patient to follow-up with IR for drain removal in 10 days.  Patient was evaluated by palliative care.  CODE STATUS changed to DNR/DNI.  Ambulatory referral to palliative care ordered.  Oncology to arrange outpatient follow-up.   Patient be discharged H. pylori treatment after he completes antibiotic course for liver abscess.  See individual problem list below for more hospital course.  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  Pathology confirmed gastric stromal tumor. -General surgery recommends neoadjuvant chemotherapy prior to resection -Oncology started [place] on 2/18  Liver abscess: MRI on 2/19 revealed complex multilocular 10.7 x  8.2 cm cystic liver mass that has increased in size in short interval since the CT on 2/12.  IR drain and drain placement on 2/19.  Culture grew Streptococcus intermedius.  Antibiotics de-escalating to penicillin G and patient spiked fever on 2/22.  Changed antibiotic to Unasyn on  2/23.  JP drain 35 cc in the last 24 hours. -IV Zosyn 2/19-2/21>> IV penicillin G 2/21> 2/23> Unasyn 2/23> 2/24.  Augmentin 2/24> 3/7. -IR recommended repeat CT if  drain <10 cc or discharge with JP drain and outpatient follow-up in 10 days. Per IR, Continue TID flushes with 5 cc NS, record output Qshift, dressing changes QD, call IR if difficulty flushing drain or sudden change in output amount or appearance. -Continue probiotics  Gastritis/H. 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  Iron sat 7%. -Hgb 6.2 (admit)>2u>> 8.7> 7.7> 7.9> 7.5 -IV Feraheme on 2/18 and 2/21 -P.o. iron twice daily  Mild transaminitis: Likely due to liver abscess and IR procedure.  Acute hepatitis panel and HIV negative.  Improved. -Recheck CMP at follow-up.  Hyponatremia: Resolved. -Continue monitoring  Metabolic acidosis: Resolved.  Constipation: Resolved.  Hypoalbuminemia: Albumin less than 1.0.  Likely due to malnutrition and poor p.o. intake in the setting of illness.  Discharged on Ensure, multivitamin and Remeron  Language barrier affecting care and communication -Used video interpreter.  Leukocytosis/thrombocytosis: Likely reactive due to liver abscess -Recheck CBC at follow-up  Goal of care: Patient reach out to [facility] for hospice service.  Palliative care consulted and met with patient.  CODE STATUS changed to DNR/DNI  Severe malnutrition/unintentional weight loss due to poor p.o. intake and GIST-he is down from 127 to 120 pounds during this hospitalization. -Appreciate dietitian recommendations -Discharged on Ensure, multivitamin and Remeron  Addendum in red as above  Discharge Instructions  Discharge Instructions    Call MD for:  extreme fatigue   Complete by: As directed    Call MD for:  persistant dizziness or light-headedness   Complete by: As  directed    Call MD for:  persistant nausea and vomiting   Complete by: As directed    Call MD for:  severe uncontrolled pain   Complete by: As directed    Call MD for:  temperature >100.4   Complete by: As directed    Diet general   Complete by: As directed    Discharge instructions   Complete by: As directed    It has been a pleasure taking care of you! You were hospitalized with difficulty eating and unintentional weight loss.  You were diagnosed with stomach cancer, stromal stomach infection and liver abscess.  We started you on medication to treat the cancer and the liver abscess.  We are discharging you on Gleevec for stomach cancer.  We are discharging you on Augmentin to complete treatment course for liver abscess.  We also gave you a prescription for stomach infection that you will start after completing Augmentin for liver abscess.  It is very important that you take those medications as prescribed. Please review your new medication list and the directions before you take your medications.  Please go to your follow-up appointments as recommended/listed under the follow-up section.   Take care,   Increase activity slowly   Complete by: As directed      Allergies as of 02/03/2020   No Known Allergies     Medication List  TAKE these medications   amoxicillin-clavulanate 875-125 MG tablet Commonly known as: Augmentin Take 1 tablet by mouth 2 (two) times daily for 10 days.   feeding supplement (ENSURE ENLIVE) Liqd Take 237 mLs by mouth 3 (three) times daily between meals.   ferrous sulfate 325 (65 FE) MG tablet Take 1 tablet (325 mg total) by mouth 2 (two) times daily with a meal.   imatinib 400 MG tablet Commonly known as: GLEEVEC Take 1 tablet (400 mg total) by mouth daily. Take with meals and large glass of water.Caution:Chemotherapy.   metroNIDAZOLE 250 MG tablet Commonly known as: Flagyl Take 1 tablet (250 mg total) by mouth 4 (four) times daily for 10  days. Start taking on: February 13, 2020   mirtazapine 15 MG tablet Commonly known as: REMERON Take 1 tablet (15 mg total) by mouth at bedtime.   multivitamin with minerals tablet Take 1 tablet by mouth daily.   ondansetron 4 MG tablet Commonly known as: Zofran Take 1 tablet (4 mg total) by mouth every 8 (eight) hours as needed for up to 10 days for nausea or vomiting. Start taking on: February 13, 2020   pantoprazole 40 MG tablet Commonly known as: Protonix Take 1 tablet (40 mg total) by mouth 2 (two) times daily for 10 days, THEN 1 tablet (40 mg total) daily for 20 days. Start taking on: February 13, 2020   Pepto-Bismol 262 MG/15ML suspension Generic drug: bismuth subsalicylate Take 30 mLs by mouth 4 (four) times daily -  before meals and at bedtime. Start taking on: February 13, 2020   saccharomyces boulardii 250 MG capsule Commonly known as: Florastor Take 1 capsule (250 mg total) by mouth 2 (two) times daily.   senna-docusate 8.6-50 MG tablet Commonly known as: Senokot S Take 1 tablet by mouth daily.   tetracycline 250 MG capsule Commonly known as: SUMYCIN Take 1 capsule (250 mg total) by mouth 4 (four) times daily. Start taking on: February 13, 2020       Consultations:  Gastroenterology, general surgery, IR, oncology and palliative care  Procedures/Studies:  2/15-EGD normal esophagus, a large infiltrative mass with no bleeding was found in the posterior wall of the stomach.   2/29-IR drainage of liver abscess with JP drain placement  DG Chest 2 View  Result Date: 01/27/2020 CLINICAL DATA:  Fever EXAM: CHEST - 2 VIEW COMPARISON:  01/22/2020 FINDINGS: The heart size and mediastinal contours are within normal limits. Both lungs are clear. Disc degenerative disease of the thoracic spine. IMPRESSION: No acute abnormality of the lungs. Electronically Signed   By: Eddie Candle M.D.   On: 01/27/2020 13:04   CT ABDOMEN PELVIS W CONTRAST  Result Date: 01/21/2020 CLINICAL DATA:  Loss  of appetite EXAM: CT ABDOMEN AND PELVIS WITH CONTRAST TECHNIQUE: Multidetector CT imaging of the abdomen and pelvis was performed using the standard protocol following bolus administration of intravenous contrast. CONTRAST:  152m OMNIPAQUE IOHEXOL 300 MG/ML  SOLN COMPARISON:  None. FINDINGS: Lower chest: No acute pleural or parenchymal lung disease. Hepatobiliary: Hypodense mass right lobe liver measures up to 5.2 x 6.9 cm coarsened for metastatic disease. Gallbladder is unremarkable. Pancreas: Unremarkable. No pancreatic ductal dilatation or surrounding inflammatory changes. Spleen: Normal in size without focal abnormality. Adrenals/Urinary Tract: Left renal cyst. Areas of right renal cortical scarring are noted. No obstructive uropathy. The adrenals are normal. Bladder is unremarkable. Stomach/Bowel: There is a large necrotic mass centered in the gastric body and antrum, measuring 16.5 by 10 cm in transverse  dimension, and extending approximately 17.3 cm in craniocaudal length. Findings are consistent with gastric adenocarcinoma. This mass results and mass effect upon the upper abdominal viscera but I do not see any direct invasion. No bowel obstruction.  Normal appendix. Vascular/Lymphatic: Vascular structures are patent. No critical stenosis. I do not see any discrete adenopathy within the abdomen or pelvis. Reproductive: The prostate is enlarged measuring 5.3 x 5.3 cm in transverse dimension and extending approximately 6.8 cm in craniocaudal length. There is mass effect upon the posterior wall the bladder. Other: No free fluid.  No free intraperitoneal gas. Musculoskeletal: There are no acute or destructive bony lesions. Reconstructed images demonstrate no additional findings. IMPRESSION: 1. Large necrotic mass centered in the posterior wall of the stomach, consistent with gastric adenocarcinoma. Endoscopy is recommended for further evaluation. 2. Irregular hypodense mass right lobe liver, worrisome for  metastatic disease. 3. Enlarged prostate. Electronically Signed   By: Randa Ngo M.D.   On: 01/21/2020 21:56   MR LIVER W WO CONTRAST  Result Date: 01/28/2020 CLINICAL DATA:  Inpatient. Weight loss. Low-grade fever. 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 Indeterminate liver mass on recent CT. EXAM: MRI ABDOMEN WITHOUT AND WITH CONTRAST TECHNIQUE: Multiplanar multisequence MR imaging of the abdomen was performed both before and after the administration of intravenous contrast. CONTRAST:  40m GADAVIST GADOBUTROL 1 MMOL/ML IV SOLN COMPARISON:  01/21/2020 CT abdomen/pelvis. FINDINGS: Lower chest: Trace dependent right pleural effusion. Hepatobiliary: Top-normal liver size. 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 There is a complex multilocular 10.7 x 8.2 cm cystic liver mass in segment 7/8 right liver lobe (series 10/image 9) with restricted diffusion, thickened enhancing wall and thickened irregular internal septations, increased in size from 7.0 x 5.1 cm on 01/21/2020 CT using similar measurement technique. No additional liver lesions. Normal gallbladder with no cholelithiasis. No biliary ductal dilatation. Common bile duct diameter 3 mm. No choledocholithiasis. No biliary strictures, masses or beading. Pancreas: No pancreatic mass or duct dilation.  No pancreas divisum. Spleen: Normal size spleen. Splenic hemosiderosis. No splenic mass. Adrenals/Urinary Tract: Normal adrenals. No hydronephrosis. Simple 2.5 cm lateral upper left renal cyst. Additional subcentimeter simple lower left renal cyst. No suspicious renal masses. Stomach/Bowel: Large exophytic centrally necrotic 17.3 x 12.3 cm gastric mass arising from posterior proximal stomach centered in the left upper quadrant (series 11/image 21) with thick enhancing wall, previously 17.0 x 11.0 cm on recent CT, not substantially changed. Visualized small and large bowel is normal caliber, with no bowel wall thickening. Vascular/Lymphatic: Normal  caliber abdominal aorta. Patent portal, splenic, hepatic and renal veins. No pathologically enlarged lymph nodes in the abdomen. Other: Trace perihepatic ascites.  No focal fluid collection. Musculoskeletal: No aggressive appearing focal osseous lesions. IMPRESSION: 1. Complex multilocular 10.7 x 8.2 cm cystic right liver mass in segment 7/8, increased in size in the short interval since 01/21/2020 CT, indeterminate but with MRI features most suggestive of a liver abscess. Consider IR consultation for consideration of percutaneous drainage. 2. Large exophytic centrally necrotic 17.3 x 12.3 cm enhancing gastric mass arising from the posterior proximal stomach, compatible with biopsy-proven GIST. 3. Trace dependent right pleural effusion.  Trace ascites. 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 Electronically Signed   By: JIlona SorrelM.D.   On: 01/28/2020 07:38   DG CHEST PORT 1 VIEW  Result Date: 01/22/2020 CLINICAL DATA:  Unable to eat, loss of appetite, no abdominal pain EXAM: PORTABLE CHEST 1 VIEW COMPARISON:  None. FINDINGS: Mixed streaky and patchy opacities are present  in lung bases more focally in the retrocardiac/left lung base. No pneumothorax or effusion. The cardiomediastinal contours are unremarkable. No acute osseous or soft tissue abnormality. Degenerative changes are present in the imaged spine and shoulders. Telemetry leads overlie the chest. IMPRESSION: Mixed streaky and patchy opacities in the bases favored to reflect atelectasis though early infection could have a similar appearance. Electronically Signed   By: Lovena Le M.D.   On: 01/22/2020 00:18   CT IMAGE GUIDED DRAINAGE BY PERCUTANEOUS CATHETER  Result Date: 01/28/2020 INDICATION: Enlarging hepatic abscess EXAM: CT-guided posterior right hepatic abscess drain MEDICATIONS: The patient is currently admitted to the hospital and receiving intravenous antibiotics. The antibiotics were administered within an appropriate time frame prior  to the initiation of the procedure. ANESTHESIA/SEDATION: Fentanyl 25 mcg IV; Versed 0.5 mg IV Moderate Sedation Time:  10 minutes The patient was continuously monitored during the procedure by the interventional radiology nurse under my direct supervision. COMPLICATIONS: None immediate. PROCEDURE: Informed written consent was obtained from the patient after a thorough discussion of the procedural risks, benefits and alternatives. All questions were addressed. Maximal Sterile Barrier Technique was utilized including caps, mask, sterile gowns, sterile gloves, sterile drape, hand hygiene and skin antiseptic. A timeout was performed prior to the initiation of the procedure. Previous imaging reviewed. Patient position slightly right anterior oblique. Noncontrast localization CT performed. The large posterior right hepatic lobe abscess was localized and marked for a lower intercostal approach in the mid axillary line. Under sterile conditions and local anesthesia, a 18 gauge needle was advanced under CT guidance into the fluid collection through a lower intercostal space. Needle position confirmed with CT. Syringe aspiration yielded purulent fluid. Sample sent for culture. Guidewire inserted followed by tract dilatation to a 12 [place] drain. Drain catheter position confirmed with CT. Syringe aspiration yielded 130 cc total blood tinged purulent fluid. No immediate complication. Patient tolerated the procedure well. Catheter secured with Prolene suture and connected to external suction bulb. Sterile dressing applied. IMPRESSION: Successful CT-guided right posterior hepatic abscess drain placement. Electronically Signed   By: Jerilynn Mages.  Shick M.D.   On: 01/28/2020 14:34       Discharge Exam: Vitals:   02/03/20 0324 02/03/20 0324  BP:  99/65  Pulse:  86  Resp:  18  Temp: 98.2 F (36.8 C)   SpO2:  92%    GENERAL: No acute distress.  Appears well.  HEENT: MMM.  Vision and hearing grossly intact.  NECK: Supple.  No  apparent JVD.  RESP:  No IWOB. Good air movement bilaterally. CVS:  RRR. Heart sounds normal.  ABD/GI/GU: Bowel sounds present. Soft. Non tender.  JP drain in right chest with serosanguineous fluid MSK/EXT:  Moves extremities. No apparent deformity or edema.  SKIN: no apparent skin lesion or wound NEURO: Awake, alert and oriented appropriately.  No apparent focal neuro deficit. PSYCH: Calm. Normal affect.   The results of significant diagnostics from this hospitalization (including imaging, microbiology, ancillary and laboratory) are listed below for reference.     Microbiology: Recent Results (from the past 240 hour(s))  Aerobic/Anaerobic Culture (surgical/deep wound)     Status: None   Collection Time: 01/28/20 12:55 PM   Specimen: Abscess  Result Value Ref Range Status   Specimen Description ABSCESS LIVER  Final   Special Requests Normal  Final   Gram Stain   Final    ABUNDANT WBC PRESENT,BOTH PMN AND MONONUCLEAR ABUNDANT GRAM POSITIVE COCCI    Culture   Final  ABUNDANT STREPTOCOCCUS INTERMEDIUS NO ANAEROBES ISOLATED Performed at [facility], [place] [address] [place], [place] [address]    Report Status 02/02/2020 FINAL  Final   Organism ID, Bacteria STREPTOCOCCUS INTERMEDIUS  Final      Susceptibility   Streptococcus intermedius - MIC*    PENICILLIN <=0.06 SENSITIVE Sensitive     CEFTRIAXONE <=0.12 SENSITIVE Sensitive     ERYTHROMYCIN <=0.12 SENSITIVE Sensitive     LEVOFLOXACIN 0.5 SENSITIVE Sensitive     VANCOMYCIN 0.5 SENSITIVE Sensitive     * ABUNDANT STREPTOCOCCUS INTERMEDIUS  Culture, blood (routine x 2)     Status: None   Collection Time: 01/28/20  6:02 PM   Specimen: BLOOD  Result Value Ref Range Status   Specimen Description BLOOD RIGHT ANTECUBITAL  Final   Special Requests   Final    BOTTLES DRAWN AEROBIC AND ANAEROBIC Blood Culture adequate volume   Culture   Final    NO GROWTH 5 DAYS Performed at [facility], [place] [address]  [place], [place] [address]    Report Status 02/02/2020 FINAL  Final  Culture, blood (routine x 2)     Status: None   Collection Time: 01/28/20  6:17 PM   Specimen: BLOOD RIGHT HAND  Result Value Ref Range Status   Specimen Description BLOOD RIGHT HAND  Final   Special Requests   Final    BOTTLES DRAWN AEROBIC ONLY Blood Culture adequate volume   Culture   Final    NO GROWTH 5 DAYS Performed at [facility], [place] [address] [place], [place] [address]    Report Status 02/02/2020 FINAL  Final     Labs: BNP (last 3 results) No results for input(s): BNP in the last 8760 hours. Basic Metabolic Panel: Recent Labs  Lab 01/29/20 0532 01/30/20 0400 01/31/20 0323 02/01/20 0553 02/02/20 0437  NA 132* 133* 133* 130* 135  K 4.5 4.1 4.1 4.2 4.3  CL 100 101 99 98 102  CO2 21* '24 22 22 23  ' GLUCOSE 121* 127* 125* 123* 103*  BUN 24* '18 13 15 21  ' CREATININE 1.00 1.00 1.03 0.97 0.85  CALCIUM 7.2* 7.1* 7.1* 7.1* 7.2*  MG 2.5* 2.5* 2.4 2.1 2.3  PHOS 4.4 3.8 3.1 2.9 3.7   Liver Function Tests: Recent Labs  Lab 01/29/20 0532 01/30/20 0400 01/31/20 0323 02/01/20 0553 02/02/20 0437  AST 56*  --  74* 66* 60*  ALT 45*  --  50* 50* 50*  ALKPHOS 162*  --  187* 165* 165*  BILITOT 0.8  --  0.6 0.4 0.6  PROT 6.0*  --  5.9* 5.6* 5.8*  ALBUMIN 1.1*   1.1* <1.0* 1.0*   1.0* <1.0*   <1.0* <1.0*   <1.0*   No results for input(s): LIPASE, AMYLASE in the last 168 hours. No results for input(s): AMMONIA in the last 168 hours. CBC: Recent Labs  Lab 01/29/20 0532 01/30/20 0400 01/31/20 0323 02/01/20 0553 02/02/20 0437  WBC 8.4 8.9 11.3* 11.1* 9.0  HGB 7.9* 7.6* 7.9* 7.4* 7.5*  HCT 25.4* 25.4* 26.3* 24.7* 25.5*  MCV 81.4 82.7 83.5 83.7 84.7  PLT 477* 469* 599* 563* 625*   Cardiac Enzymes: No results for input(s): CKTOTAL, CKMB, CKMBINDEX, TROPONINI in the last 168 hours. BNP: Invalid input(s): POCBNP CBG: No results for input(s): GLUCAP in the last 168 hours. D-Dimer No results for  input(s): DDIMER in the last 72 hours. Hgb A1c No results for input(s): HGBA1C in the last  72 hours. Lipid Profile No results for input(s): CHOL, HDL, LDLCALC, TRIG, CHOLHDL, LDLDIRECT in the last 72 hours. Thyroid function studies No results for input(s): TSH, T4TOTAL, T3FREE, THYROIDAB in the last 72 hours.  Invalid input(s): FREET3 Anemia work up No results for input(s): VITAMINB12, FOLATE, FERRITIN, TIBC, IRON, RETICCTPCT in the last 72 hours. Urinalysis    Component Value Date/Time   COLORURINE YELLOW 01/22/2020 0418   APPEARANCEUR CLEAR 01/22/2020 0418   LABSPEC 1.040 (H) 01/22/2020 0418   PHURINE 5.0 01/22/2020 0418   GLUCOSEU NEGATIVE 01/22/2020 0418   HGBUR NEGATIVE 01/22/2020 0418   BILIRUBINUR NEGATIVE 01/22/2020 0418   KETONESUR NEGATIVE 01/22/2020 0418   PROTEINUR NEGATIVE 01/22/2020 0418   NITRITE NEGATIVE 01/22/2020 0418   LEUKOCYTESUR NEGATIVE 01/22/2020 0418   Sepsis Labs Invalid input(s): PROCALCITONIN,  WBC,  LACTICIDVEN   Time coordinating discharge: 45 minutes  SIGNED:  Mercy Riding, MD  [facility] 02/03/2020, 7:16 AM  If 7PM-7AM, please contact night-coverage [email or web address] Password [email or web address]

## 2020-02-03 NOTE — Discharge Instructions (Signed)
-   he will need to flush the drain QD with 5 cc NS (will need rx for this at d/c), record output every day, dressing changes every 2-3 days or earlier if soiled. May shower with drain as long as it is covered with a water tight dressing (may use saran wrap or similar).

## 2020-02-04 ENCOUNTER — Other Ambulatory Visit: Payer: Self-pay | Admitting: General Surgery

## 2020-02-04 ENCOUNTER — Telehealth: Payer: Self-pay

## 2020-02-04 DIAGNOSIS — K750 Abscess of liver: Secondary | ICD-10-CM

## 2020-02-04 NOTE — Telephone Encounter (Signed)
Received verbal ok for Palliative care referral  From [place] with Dr. Benay Spice

## 2020-02-07 ENCOUNTER — Encounter: Payer: Self-pay | Admitting: Oncology

## 2020-02-07 NOTE — Progress Notes (Signed)
Called pt to introduce myself as his Arboriculturist and to discuss the [employer].  I left a msg requesting he return my call regarding the [employer].

## 2020-02-09 ENCOUNTER — Other Ambulatory Visit: Payer: Self-pay

## 2020-02-09 ENCOUNTER — Inpatient Hospital Stay: Payer: Self-pay | Attending: Nurse Practitioner | Admitting: Nurse Practitioner

## 2020-02-09 ENCOUNTER — Encounter: Payer: Self-pay | Admitting: *Deleted

## 2020-02-09 ENCOUNTER — Telehealth: Payer: Self-pay | Admitting: Pharmacist

## 2020-02-09 ENCOUNTER — Inpatient Hospital Stay: Payer: Self-pay

## 2020-02-09 ENCOUNTER — Encounter: Payer: Self-pay | Admitting: Nurse Practitioner

## 2020-02-09 VITALS — BP 136/71 | HR 87 | Temp 97.8°F | Resp 16 | Ht 66.0 in | Wt 129.4 lb

## 2020-02-09 DIAGNOSIS — K750 Abscess of liver: Secondary | ICD-10-CM | POA: Insufficient documentation

## 2020-02-09 DIAGNOSIS — R110 Nausea: Secondary | ICD-10-CM | POA: Insufficient documentation

## 2020-02-09 DIAGNOSIS — D509 Iron deficiency anemia, unspecified: Secondary | ICD-10-CM | POA: Insufficient documentation

## 2020-02-09 DIAGNOSIS — C49A2 Gastrointestinal stromal tumor of stomach: Secondary | ICD-10-CM

## 2020-02-09 LAB — CBC WITH DIFFERENTIAL (CANCER CENTER ONLY)
Abs Immature Granulocytes: 0.02 10*3/uL (ref 0.00–0.07)
Basophils Absolute: 0 10*3/uL (ref 0.0–0.1)
Basophils Relative: 0 %
Eosinophils Absolute: 0.1 10*3/uL (ref 0.0–0.5)
Eosinophils Relative: 1 %
HCT: 27.3 % — ABNORMAL LOW (ref 39.0–52.0)
Hemoglobin: 8.3 g/dL — ABNORMAL LOW (ref 13.0–17.0)
Immature Granulocytes: 0 %
Lymphocytes Relative: 22 %
Lymphs Abs: 1 10*3/uL (ref 0.7–4.0)
MCH: 26.3 pg (ref 26.0–34.0)
MCHC: 30.4 g/dL (ref 30.0–36.0)
MCV: 86.4 fL (ref 80.0–100.0)
Monocytes Absolute: 0.4 10*3/uL (ref 0.1–1.0)
Monocytes Relative: 9 %
Neutro Abs: 3.2 10*3/uL (ref 1.7–7.7)
Neutrophils Relative %: 68 %
Platelet Count: 471 10*3/uL — ABNORMAL HIGH (ref 150–400)
RBC: 3.16 MIL/uL — ABNORMAL LOW (ref 4.22–5.81)
RDW: 20.2 % — ABNORMAL HIGH (ref 11.5–15.5)
WBC Count: 4.7 10*3/uL (ref 4.0–10.5)
nRBC: 0 % (ref 0.0–0.2)

## 2020-02-09 LAB — CMP (CANCER CENTER ONLY)
ALT: 30 U/L (ref 0–44)
AST: 31 U/L (ref 15–41)
Albumin: 1.4 g/dL — ABNORMAL LOW (ref 3.5–5.0)
Alkaline Phosphatase: 150 U/L — ABNORMAL HIGH (ref 38–126)
Anion gap: 7 (ref 5–15)
BUN: 14 mg/dL (ref 8–23)
CO2: 26 mmol/L (ref 22–32)
Calcium: 7.5 mg/dL — ABNORMAL LOW (ref 8.9–10.3)
Chloride: 107 mmol/L (ref 98–111)
Creatinine: 0.75 mg/dL (ref 0.61–1.24)
GFR, Est AFR Am: >60 mL/min (ref >60)
GFR, Estimated: >60 mL/min (ref >60)
Glucose, Bld: 95 mg/dL (ref 70–99)
Potassium: 3.7 mmol/L (ref 3.5–5.1)
Sodium: 140 mmol/L (ref 135–145)
Total Bilirubin: 0.3 mg/dL (ref 0.3–1.2)
Total Protein: 6.7 g/dL (ref 6.5–8.1)

## 2020-02-09 MED ORDER — ONDANSETRON 8 MG PO TBDP
8.0000 mg | ORAL_TABLET | Freq: Once | ORAL | Status: AC
  Administered 2020-02-09: 8 mg via ORAL
  Filled 2020-02-09: qty 1

## 2020-02-09 NOTE — Progress Notes (Signed)
[place] Work   Clinical Social Work met with patient, family member and video interpreter in exam room at [facility] to offer support and assess for needs.  Patient stated he "thinks" he remembers talking to someone in the hospital about medicaid.  CSW informed patient that his medication had been initiated by the in-patient financial advocate.  Patient and family had questions regarding social security disability.  CSW provided education on SSD vs social security.  Because patient is over 3 he would not qualify for disability, and would need to apply for social security and medicare.  Patients family member stated they would assist patient in calling and starting the application process.  CSW and patient discussed resources at [facility] and CSW enrolled patient in the sherrill fund program.  CSW also encouraged patient to apply for the [employer].  CSW informed patient to bring proof of income or letter of support to financial advocate.  CSW provided contact information and encouraged patient/family to call with needs or concerns.    Johnnye Lana, MSW, LCSW, OSW-C Clinical Social Worker [facility] [phone]

## 2020-02-09 NOTE — Progress Notes (Addendum)
[place] OFFICE PROGRESS NOTE   Diagnosis: Gastrointestinal stromal tumor  INTERVAL HISTORY:   Kevin [name] returns for follow-up.  He is accompanied by an interpreter and his daughter.  He continues Kevin [name].  Overall he feels well.  He is experiencing nausea with one of his medications.  He is unsure which medication it is.  No diarrhea.  No rash.  No shortness of breath or cough.  No leg swelling.  He denies fever.  No bleeding.  Objective:  Vital signs in last 24 hours:  Blood pressure 136/71, pulse 87, temperature 97.8 F (36.6 C), temperature source Temporal, resp. rate 16, height 5\' 6"  (1.676 m), weight 129 lb 6.4 oz (58.7 kg), SpO2 100 %.    HEENT: Mild white coating of her tongue.  No buccal thrush. Resp: Lungs are clear.  Breath sounds diminished at the right lower lung field.  No respiratory distress. Cardio: Regular rate and rhythm. GI: Abdomen soft and nontender.  No hepatomegaly.  Drain right abdomen. Vascular: No leg edema.  Skin: No rash.   Lab Results:  Lab Results  Component Value Date   WBC 4.7 02/09/2020   HGB 8.3 (L) 02/09/2020   HCT 27.3 (L) 02/09/2020   MCV 86.4 02/09/2020   PLT 471 (H) 02/09/2020   NEUTROABS 3.2 02/09/2020    Imaging:  No results found.  Medications: I have reviewed the patient's current medications.  Assessment/Plan: 1.GISTof the stomach  CT abdomen/pelvis 01/21/2020-large necrotic mass centered in the posterior wall of the stomach, irregular hypodense mass right lobe liver   Upper endoscopy 01/24/2020-large infiltrative mass with no bleeding found in the posterior wall of the stomach.  Biopsy-gastrointestinal stromal tumor   MRI liver 01/28/2020-large exophytic centrally necrotic 17.3 x 12.3 cm gastric mass arising from posterior proximal stomach centered in the left upper quadrant with thick enhancing wall.  Complex multilocular 10.7 x 8.2 cm cystic liver mass right liver lobe.    Neoadjuvant Gleevec  01/28/2020 2. Liver lesion in the right lobe             -Liver mass consistent with abscess on MRI of the liver from 01/27/2020             -Status post CT-guided drainage of the liver abscess on 01/28/2020-culture positive for Streptococcus intermedius  -IV antibiotics and discharged home on 10-day course of Augmentin 3. Iron deficiency anemia-oral iron. 4. Gastritis and H. pylori infection 5. Fever secondary to the liver abscess 6. Protein calorie malnutrition  Disposition: Kevin [name] was recently diagnosed with a gastrointestinal stromal tumor involving the stomach.  He began neoadjuvant Gleevec on 01/28/2020.  He seems to be tolerating the [place] well.  Plan to continue the same.  He has a drainage catheter for the liver abscess.  He is scheduled to follow-up with interventional radiology on 02/15/2020.  He will continue Augmentin to complete the 10-day course.  We reviewed the CBC from today.  Hemoglobin is better.  He will continue oral iron.  He will return for lab and follow-up in 2 weeks.  He will contact the office in the interim with any problems.  Patient seen with Dr. Benay Spice.      Ned Card ANP/GNP-BC   02/09/2020  11:19 AM This was a shared visit with Ned Card.  Kevin [name] was interviewed and examined.  He appears to be tolerating the [place] well.  He continues antibiotics for treatment of the liver abscess and he is maintained on iron.  We discussed  the diagnosis and treatment plan with his daughter.  Julieanne Manson, MD

## 2020-02-09 NOTE — Telephone Encounter (Signed)
Oral Chemotherapy Pharmacist Encounter  Patient was started on imatinib while inpatient and picked up imatinib from [facility] upon discharge.  Patient Education I spoke with patient and daughter via interpretor in clinic following his appt. I provided an overview of new oral chemotherapy medication: Gleevec (imatinib) for the neoadjuvant treatment of newly diagnosed GIST, planned duration until able to proceed with surgery.   Counseled patient on administration, dosing, side effects, monitoring, drug-food interactions, safe handling, storage, and disposal. Patient will take 1 tablet (400 mg total) by mouth daily. Take with meals and large glass of water.  Side effects include but not limited to: N/V, diarrhea, fatigue, rash/itchy skin, edema, decreased wbc, HA.  Patient had an episode of nausea while in the office today. Discussed with patient the management of nausea. His daughter believes it is because he took his medication without eating. His daughter says they received a message about not eating prior to lab work today, but she usually has him take his medication with food as instructed.   Reviewed with patient importance of keeping a medication schedule and plan for any missed doses.  Mr. Caballeros and his daughter voiced understanding and appreciation. All questions answered. Medication handout provided.  Provided patient with Oral [facility] phone number. Patient knows to call the office with questions or concerns. Oral Chemotherapy Navigation Clinic will continue to follow.  Darl Pikes, PharmD, BCPS, [facility] Hematology/Oncology Clinical Pharmacist [facility] [place] [facility] [phone]  02/09/2020 1:45 PM

## 2020-02-10 ENCOUNTER — Telehealth: Payer: Self-pay | Admitting: Hospice

## 2020-02-10 NOTE — Telephone Encounter (Signed)
Phone call placed to patient to offer to schedule a visit with [facility]. Phone rang, with no answer I left a voicemail for call back. 

## 2020-02-10 NOTE — Telephone Encounter (Signed)
[facility] visit scheduled for 03-13-20 at 1:00.

## 2020-02-14 ENCOUNTER — Telehealth: Payer: Self-pay

## 2020-02-14 NOTE — Telephone Encounter (Signed)
Left VM offering to move up Palliative care visit to 02/17/20 @ 12 noon. Contact information provided

## 2020-02-14 NOTE — Telephone Encounter (Signed)
VM left offering to move Palliative care visit to 02/17/20 @ 12 noon. Contact information provided.

## 2020-02-15 ENCOUNTER — Other Ambulatory Visit: Payer: Self-pay

## 2020-02-15 ENCOUNTER — Encounter: Payer: Self-pay | Admitting: *Deleted

## 2020-02-15 ENCOUNTER — Ambulatory Visit
Admission: RE | Admit: 2020-02-15 | Discharge: 2020-02-15 | Disposition: A | Discharge status: Home / Self Care | Payer: Self-pay | Source: Ambulatory Visit | Attending: Physician Assistant | Admitting: Physician Assistant

## 2020-02-15 ENCOUNTER — Ambulatory Visit
Admission: RE | Admit: 2020-02-15 | Discharge: 2020-02-15 | Disposition: A | Discharge status: Home / Self Care | Payer: Self-pay | Source: Ambulatory Visit | Attending: General Surgery | Admitting: General Surgery

## 2020-02-15 ENCOUNTER — Other Ambulatory Visit (HOSPITAL_COMMUNITY): Payer: Self-pay | Admitting: General Surgery

## 2020-02-15 DIAGNOSIS — K750 Abscess of liver: Secondary | ICD-10-CM

## 2020-02-15 HISTORY — PX: IR RADIOLOGIST EVAL & MGMT: IMG5224

## 2020-02-15 MED ORDER — IOPAMIDOL (ISOVUE-300) INJECTION 61%
100.0000 mL | Freq: Once | INTRAVENOUS | Status: AC | PRN
  Administered 2020-02-15: 100 mL via INTRAVENOUS

## 2020-02-15 NOTE — Progress Notes (Signed)
Referring Physician(s): Byerly,F  Chief Complaint: The patient is seen in follow up today s/p image guided drainage of a right posterior hepatic abscess on 01/28/2020  History of present illness: Kevin [name] is a 69 year old male with history of GIST of the stomach, currently on [place], who underwent image guided drainage of a right posterior hepatic abscess on 01/28/2020.  Cultures revealed Streptococcus intermedius.  He presents today for follow-up imaging and drain evaluation.  Kevin [name] was addressed today with the aid of an interpreter as well as his spouse.  He currently denies fever, headache, chest pain, dyspnea, cough, back pain, vomiting or bleeding.  He does have some minimal soreness at right upper quadrant drain site, intermittent nausea and some generalized fatigue/weakness.  He has completed antibiotic therapy.  His daughter flushes the right upper quadrant drain once daily with 5 cc sterile saline. Output has ranged from 5 to 20 cc/day of turbid beige-colored fluid.   No past medical history on file.  Past Surgical History:  Procedure Laterality Date  . BIOPSY  01/24/2020   Procedure: BIOPSY;  Surgeon: Wonda Horner, MD;  Location: [place];  Service: Endoscopy;;  . ESOPHAGOGASTRODUODENOSCOPY (EGD) WITH PROPOFOL N/A 01/24/2020   Procedure: ESOPHAGOGASTRODUODENOSCOPY (EGD) WITH PROPOFOL;  Surgeon: Wonda Horner, MD;  Location: [facility] ENDOSCOPY;  Service: Endoscopy;  Laterality: N/A;  . SCLEROTHERAPY  01/24/2020   Procedure: SCLEROTHERAPY;  Surgeon: Wonda Horner, MD;  Location: [facility] ENDOSCOPY;  Service: Endoscopy;;    Allergies: Patient has no known allergies.  Medications: Prior to Admission medications   Medication Sig Start Date End Date Taking? Authorizing Provider  bismuth subsalicylate (PEPTO-BISMOL) 262 MG/15ML suspension Take 30 mLs by mouth 4 (four) times daily -  before meals and at bedtime. Patient not taking: Reported on 02/09/2020 02/13/20   Mercy Riding, MD    feeding supplement, ENSURE ENLIVE, (ENSURE ENLIVE) LIQD Take 237 mLs by mouth 3 (three) times daily between meals. 01/28/20 02/27/20  Mercy Riding, MD  ferrous sulfate 325 (65 FE) MG tablet Take 1 tablet (325 mg total) by mouth 2 (two) times daily with a meal. 01/28/20   Mercy Riding, MD  imatinib (GLEEVEC) 400 MG tablet Take 1 tablet (400 mg total) by mouth daily. Take with meals and large glass of water.Caution:Chemotherapy. 01/27/20   Ladell Pier, MD  metroNIDAZOLE (FLAGYL) 250 MG tablet Take 1 tablet (250 mg total) by mouth 4 (four) times daily for 10 days. Patient not taking: Reported on 02/09/2020 02/13/20 02/23/20  Mercy Riding, MD  mirtazapine (REMERON) 15 MG tablet Take 1 tablet (15 mg total) by mouth at bedtime. 02/03/20   Mercy Riding, MD  Multiple Vitamins-Minerals (MULTIVITAMIN WITH MINERALS) tablet Take 1 tablet by mouth daily. 01/28/20 07/26/20  Mercy Riding, MD  ondansetron (ZOFRAN) 4 MG tablet Take 1 tablet (4 mg total) by mouth every 8 (eight) hours as needed for up to 10 days for nausea or vomiting. 02/13/20 02/23/20  Mercy Riding, MD  pantoprazole (PROTONIX) 40 MG tablet Take 1 tablet (40 mg total) by mouth 2 (two) times daily for 10 days, THEN 1 tablet (40 mg total) daily for 20 days. Patient not taking: Reported on 02/09/2020 02/13/20 03/14/20  Mercy Riding, MD  saccharomyces boulardii (FLORASTOR) 250 MG capsule Take 1 capsule (250 mg total) by mouth 2 (two) times daily. Patient not taking: Reported on 02/09/2020 02/03/20   Mercy Riding, MD  senna-docusate (SENOKOT S) 8.6-50 MG tablet Take 1  tablet by mouth daily. 01/28/20   Mercy Riding, MD  tetracycline (SUMYCIN) 250 MG capsule Take 1 capsule (250 mg total) by mouth 4 (four) times daily. Patient not taking: Reported on 02/09/2020 02/13/20   Mercy Riding, MD     Family History  Family history unknown: Yes    Social History   Socioeconomic History  . Marital status: Married    Spouse name: Not on file  . Number of children: 3  .  Years of education: Not on file  . Highest education level: Not on file  Occupational History  . Not on file  Tobacco Use  . Smoking status: Current Every Day Smoker    Types: Cigarettes  . Smokeless tobacco: Never Used  . Tobacco comment: 01/27/2020 - Pt denied smoking cigarettes  Substance and Sexual Activity  . Alcohol use: Not Currently  . Drug use: Not on file  . Sexual activity: Not on file  Other Topics Concern  . Not on file  Social History Narrative  . Not on file   Social Determinants of Health   Financial Resource Strain:   . Difficulty of Paying Living Expenses: Not on file  Food Insecurity:   . Worried About Charity fundraiser in the Last Year: Not on file  . Ran Out of Food in the Last Year: Not on file  Transportation Needs:   . Lack of Transportation (Medical): Not on file  . Lack of Transportation (Non-Medical): Not on file  Physical Activity:   . Days of Exercise per Week: Not on file  . Minutes of Exercise per Session: Not on file  Stress:   . Feeling of Stress : Not on file  Social Connections:   . Frequency of Communication with Friends and Family: Not on file  . Frequency of Social Gatherings with Friends and Family: Not on file  . Attends Religious Services: Not on file  . Active Member of Clubs or Organizations: Not on file  . Attends Archivist Meetings: Not on file  . Marital Status: Not on file     Vital Signs: Vitals:   02/15/20 0900  BP: 133/75  Pulse: 85  Temp: 98 F (36.7 C)  SpO2: 100%      Physical Exam awake, alert.  Right upper quadrant/hepatic drain intact, insertion site okay, mildly tender to palpation.  Abdomen slightly distended.  Approximately 5 cc of turbid beige-colored fluid in JP bulb.  Imaging: No results found.  Labs:  CBC: Recent Labs    02/01/20 0553 02/02/20 0437 02/03/20 0736 02/09/20 1055  WBC 11.1* 9.0 7.1 4.7  HGB 7.4* 7.5* 7.3* 8.3*  HCT 24.7* 25.5* 25.0* 27.3*  PLT 563* 625* 582*  471*    COAGS: Recent Labs    01/28/20 1035  INR 1.4*    BMP: Recent Labs    02/01/20 0553 02/02/20 0437 02/03/20 0736 02/09/20 1055  NA 130* 135 136 140  K 4.2 4.3 3.8 3.7  CL 98 102 103 107  CO2 22 23 24 26   GLUCOSE 123* 103* 107* 95  BUN 15 21 17 14   CALCIUM 7.1* 7.2* 7.2* 7.5*  CREATININE 0.97 0.85 0.79 0.75  GFRNONAA >60 >60 >60 >60  GFRAA >60 >60 >60 >60    LIVER FUNCTION TESTS: Recent Labs    01/31/20 0323 01/31/20 0323 02/01/20 0553 02/02/20 0437 02/03/20 0736 02/09/20 1055  BILITOT 0.6  --  0.4 0.6  --  0.3  AST 74*  --  66* 60*  --  31  ALT 50*  --  50* 50*  --  30  ALKPHOS 187*  --  165* 165*  --  150*  PROT 5.9*  --  5.6* 5.8*  --  6.7  ALBUMIN 1.0*  1.0*   < > <1.0*  <1.0* <1.0*  <1.0* <1.0* 1.4*   < > = values in this interval not displayed.    Assessment: 69 year old male with history of GIST of the stomach, currently on Gleevec, who underwent image guided drainage of a right posterior hepatic abscess on 01/28/2020.  Cultures revealed Streptococcus intermedius.  He presents today for follow-up imaging and drain evaluation.  Kevin [name] was addressed today with the aid of an interpreter as well as his spouse.  He currently denies fever, headache, chest pain, worsening dyspnea, cough, back pain, vomiting or bleeding.  He does have some minimal soreness at right upper quadrant drain site, intermittent nausea and some generalized fatigue/weakness.  He has completed antibiotic therapy.  His daughter flushes the right upper quadrant drain once daily with 5 cc sterile saline. Output has ranged from 5 to 20 cc/day of turbid beige-colored fluid.  Follow-up CT abdomen pelvis today reveals: Overall some decrease in size of the posterior right hepatic complex fluid collection compatible with abscess. Retraction of the drain catheter to the peripheral aspect. Cultures grew Streptococcus intermedius.  Slight decrease in size of the large exophytic centrally  necrotic enhancing gastric mass posteriorly compatible with biopsy-proven GIST.  Pleural effusions and basilar atelectasis, worse on the right.  Slight increase in volume of abdominopelvic ascites  Following review of imaging by Dr. Annamaria Boots patient will be scheduled for hepatic drain repositioning/upsizing at [facility] on 3/12.  Preprocedure instructions given to patient.  He will continue current medications and drain flush regimen for now.  Recommend submitting drain fluid for repeat cultures as well as cytology after drain exchange.   Signed: D. Rowe Robert, PA-C 02/15/2020, 9:52 AM   Please refer to Dr. Fritz Pickerel attestation of this note for management and plan.      Patient ID: Kevin [name], male   DOB: 10-26-1951, 69 y.o.   MRN: [id number]

## 2020-02-18 ENCOUNTER — Ambulatory Visit (HOSPITAL_COMMUNITY)
Admission: RE | Admit: 2020-02-18 | Discharge: 2020-02-18 | Disposition: A | Discharge status: Home / Self Care | Payer: Self-pay | Source: Ambulatory Visit | Attending: General Surgery | Admitting: General Surgery

## 2020-02-18 ENCOUNTER — Inpatient Hospital Stay: Payer: Self-pay | Admitting: Nurse Practitioner

## 2020-02-18 ENCOUNTER — Other Ambulatory Visit: Payer: Self-pay

## 2020-02-18 DIAGNOSIS — Z4803 Encounter for change or removal of drains: Secondary | ICD-10-CM | POA: Insufficient documentation

## 2020-02-18 DIAGNOSIS — K750 Abscess of liver: Secondary | ICD-10-CM | POA: Insufficient documentation

## 2020-02-18 HISTORY — PX: IR CATHETER TUBE CHANGE: IMG717

## 2020-02-18 MED ORDER — LIDOCAINE HCL (PF) 1 % IJ SOLN
INTRAMUSCULAR | Status: DC | PRN
  Administered 2020-02-18: 5 mL

## 2020-02-18 MED ORDER — LIDOCAINE HCL 1 % IJ SOLN
INTRAMUSCULAR | Status: AC
  Filled 2020-02-18: qty 20

## 2020-02-18 MED ORDER — IOHEXOL 300 MG/ML  SOLN
50.0000 mL | Freq: Once | INTRAMUSCULAR | Status: AC | PRN
  Administered 2020-02-18: 10:00:00 15 mL

## 2020-02-18 NOTE — Procedures (Signed)
Interventional Radiology Procedure Note  Procedure: fluoro hepatic abscess drain reposition  Complications: None  Estimated Blood Loss: min  Findings: 18cc pus aspirated from dominant residual loculation  Glenn T. Kathlene Cote, M.D Pager:  [phone]

## 2020-02-23 ENCOUNTER — Encounter: Payer: Self-pay | Admitting: General Practice

## 2020-02-23 ENCOUNTER — Inpatient Hospital Stay: Payer: Self-pay

## 2020-02-23 ENCOUNTER — Telehealth: Payer: Self-pay

## 2020-02-23 ENCOUNTER — Inpatient Hospital Stay: Payer: Self-pay | Admitting: Nurse Practitioner

## 2020-02-23 ENCOUNTER — Inpatient Hospital Stay: Payer: Self-pay | Admitting: General Practice

## 2020-02-23 LAB — AEROBIC/ANAEROBIC CULTURE W GRAM STAIN (SURGICAL/DEEP WOUND): Culture: NO GROWTH

## 2020-02-23 NOTE — Telephone Encounter (Signed)
VM left regarding missed appointment today

## 2020-02-23 NOTE — Progress Notes (Signed)
[place] CSW Progress Notes  Patient no showed for scheduled appointments today. CSW contacted Spanish interpreter Consuella Lose, she has attempted to contact patient by phone and text.  Thus far, no response.  Edwyna Shell, LCSW Clinical Social Worker Phone:  [phone]

## 2020-02-24 ENCOUNTER — Encounter: Payer: Self-pay | Admitting: General Practice

## 2020-02-24 ENCOUNTER — Telehealth: Payer: Self-pay | Admitting: Nurse Practitioner

## 2020-02-24 ENCOUNTER — Other Ambulatory Visit: Payer: Self-pay | Admitting: Oncology

## 2020-02-24 NOTE — Telephone Encounter (Signed)
Called pt per 3/18 sch message -unable to reach pt . Left message for pt to call back to reschedule missed appt   interp ID # [id number] Quillian Quince

## 2020-02-24 NOTE — Progress Notes (Signed)
[place] CSW Progress Notes  Call from wife, Cortlandt Kosky.  States family was unaware of yesterday's appointment and thus did not come.  Says daughter Zakaree Finkel is the family member most responsible for keeping track of appointments and transportation to/from.  Asked that patient be asked to add daughter to his contact list, sign DPR for her, sign up for My Chart and add daughter as user so she can keep track of appointments and needs.  Wife states that appointments should be scheduled on [phone] number - her number [phone]) cannot be answered during the day.  Also states that patient has appt on 3/31 at 1 PM to sign up for Social Security retirement benefits.  Wife states he is not eligible for Medicare, but she believes he will be granted Medicaid.  They are working w a Chief Strategy Officer for this - she does not know their name.  Application was started by [facility] when pt was inpt at [facility].  Request for new appointment/reschedule conveyed to treatment team.  Message sent to [facility] for update on Medicaid.  Edwyna Shell, LCSW Clinical Social Worker Phone:  [phone] Cell:  [phone]

## 2020-03-01 ENCOUNTER — Other Ambulatory Visit: Payer: Self-pay | Admitting: General Surgery

## 2020-03-01 ENCOUNTER — Other Ambulatory Visit (HOSPITAL_COMMUNITY): Payer: Self-pay | Admitting: General Surgery

## 2020-03-01 DIAGNOSIS — K750 Abscess of liver: Secondary | ICD-10-CM

## 2020-03-02 MED FILL — MIRTAZAPINE 15 MG TABLET: 15 | 30 days supply | Qty: 30 | Fill #0

## 2020-03-03 ENCOUNTER — Ambulatory Visit (HOSPITAL_COMMUNITY)
Admission: RE | Admit: 2020-03-03 | Discharge: 2020-03-03 | Disposition: A | Discharge status: Home / Self Care | Payer: Self-pay | Source: Ambulatory Visit | Attending: General Surgery | Admitting: General Surgery

## 2020-03-03 ENCOUNTER — Other Ambulatory Visit: Payer: Self-pay

## 2020-03-03 DIAGNOSIS — K750 Abscess of liver: Secondary | ICD-10-CM

## 2020-03-03 DIAGNOSIS — J90 Pleural effusion, not elsewhere classified: Secondary | ICD-10-CM | POA: Insufficient documentation

## 2020-03-03 DIAGNOSIS — R188 Other ascites: Secondary | ICD-10-CM | POA: Insufficient documentation

## 2020-03-03 DIAGNOSIS — R599 Enlarged lymph nodes, unspecified: Secondary | ICD-10-CM | POA: Insufficient documentation

## 2020-03-03 MED ORDER — IOHEXOL 300 MG/ML  SOLN
100.0000 mL | Freq: Once | INTRAMUSCULAR | Status: AC | PRN
  Administered 2020-03-03: 100 mL via INTRAVENOUS

## 2020-03-03 MED FILL — IMATINIB MESYLATE 400 MG TA: 400 | 30 days supply | Qty: 30 | Fill #0

## 2020-03-03 NOTE — Progress Notes (Signed)
Referring Physician(s): Byerly,Faera  Chief Complaint: The patient is seen in follow up today s/p hepatic abscess  History of present illness:  Kevin [name] is a 69 year old male with history of GIST of the stomach, currently on [place], who underwent image guided drainage of a right posterior hepatic abscess on 01/28/2020.  Cultures revealed Streptococcus intermedius. He was recently seen in Interventional Radiology drain clinic 02/15/20 at which time it was determined that he needed drain repositioning. He underwent drain exchange/repositioning 02/18/20.  He contact our office a few days ago due to decreased output from his drain and was scheduled for fluoro injection today. After discussion with patient, it appears his output has, in fact, slowed considerably.  He currently denies fever, headache, chest pain, dyspnea, cough, back pain, vomiting or bleeding.  He does not believe he is on antibiotics at this time.  His daughter flushes the right upper quadrant drain once daily with 5 mL sterile saline.  No past medical history on file.  Past Surgical History:  Procedure Laterality Date  . BIOPSY  01/24/2020   Procedure: BIOPSY;  Surgeon: Wonda Horner, MD;  Location: [place];  Service: Endoscopy;;  . ESOPHAGOGASTRODUODENOSCOPY (EGD) WITH PROPOFOL N/A 01/24/2020   Procedure: ESOPHAGOGASTRODUODENOSCOPY (EGD) WITH PROPOFOL;  Surgeon: Wonda Horner, MD;  Location: [facility] ENDOSCOPY;  Service: Endoscopy;  Laterality: N/A;  . IR CATHETER TUBE CHANGE  02/18/2020  . IR RADIOLOGIST EVAL & MGMT  02/15/2020  . SCLEROTHERAPY  01/24/2020   Procedure: SCLEROTHERAPY;  Surgeon: Wonda Horner, MD;  Location: [facility] ENDOSCOPY;  Service: Endoscopy;;    Allergies: Patient has no known allergies.  Medications: Prior to Admission medications   Medication Sig Start Date End Date Taking? Authorizing Provider  bismuth subsalicylate (PEPTO-BISMOL) 262 MG/15ML suspension Take 30 mLs by mouth 4 (four) times daily -   before meals and at bedtime. Patient not taking: Reported on 02/09/2020 02/13/20   Mercy Riding, MD  ferrous sulfate 325 (65 FE) MG tablet Take 1 tablet (325 mg total) by mouth 2 (two) times daily with a meal. 01/28/20   Gonfa, Charlesetta Ivory, MD  imatinib (GLEEVEC) 400 MG tablet TAKE 1 TABLET (400 MG TOTAL) BY MOUTH DAILY. TAKE WITH MEALS AND LARGE GLASS OF WATER. CAUTION:CHEMOTHERAPY. 02/25/20   Ladell Pier, MD  mirtazapine (REMERON) 15 MG tablet Take 1 tablet (15 mg total) by mouth at bedtime. 02/03/20   Mercy Riding, MD  Multiple Vitamins-Minerals (MULTIVITAMIN WITH MINERALS) tablet Take 1 tablet by mouth daily. 01/28/20 07/26/20  Mercy Riding, MD  pantoprazole (PROTONIX) 40 MG tablet Take 1 tablet (40 mg total) by mouth 2 (two) times daily for 10 days, THEN 1 tablet (40 mg total) daily for 20 days. Patient not taking: Reported on 02/09/2020 02/13/20 03/14/20  Mercy Riding, MD  saccharomyces boulardii (FLORASTOR) 250 MG capsule Take 1 capsule (250 mg total) by mouth 2 (two) times daily. Patient not taking: Reported on 02/09/2020 02/03/20   Mercy Riding, MD  senna-docusate (SENOKOT S) 8.6-50 MG tablet Take 1 tablet by mouth daily. 01/28/20   Mercy Riding, MD  tetracycline (SUMYCIN) 250 MG capsule Take 1 capsule (250 mg total) by mouth 4 (four) times daily. Patient not taking: Reported on 02/09/2020 02/13/20   Mercy Riding, MD     Family History  Family history unknown: Yes    Social History   Socioeconomic History  . Marital status: Married    Spouse name: Not on file  . Number  of children: 3  . Years of education: Not on file  . Highest education level: Not on file  Occupational History  . Not on file  Tobacco Use  . Smoking status: Current Every Day Smoker    Types: Cigarettes  . Smokeless tobacco: Never Used  . Tobacco comment: 01/27/2020 - Pt denied smoking cigarettes  Substance and Sexual Activity  . Alcohol use: Not Currently  . Drug use: Not on file  . Sexual activity: Not on file  Other  Topics Concern  . Not on file  Social History Narrative  . Not on file   Social Determinants of Health   Financial Resource Strain:   . Difficulty of Paying Living Expenses:   Food Insecurity:   . Worried About Charity fundraiser in the Last Year:   . Arboriculturist in the Last Year:   Transportation Needs:   . Film/video editor (Medical):   Marland Kitchen Lack of Transportation (Non-Medical):   Physical Activity:   . Days of Exercise per Week:   . Minutes of Exercise per Session:   Stress:   . Feeling of Stress :   Social Connections:   . Frequency of Communication with Friends and Family:   . Frequency of Social Gatherings with Friends and Family:   . Attends Religious Services:   . Active Member of Clubs or Organizations:   . Attends Archivist Meetings:   Marland Kitchen Marital Status:      Vital Signs: There were no vitals taken for this visit.  Physical Exam  NAD, alert, spanish-speaking male Abdomen: soft, non-tender. Insertion site intact, clean, and dry. 1-2 mL of thin, yellow fluid in JP.   Imaging: No results found.  Labs:  CBC: Recent Labs    02/01/20 0553 02/02/20 0437 02/03/20 0736 02/09/20 1055  WBC 11.1* 9.0 7.1 4.7  HGB 7.4* 7.5* 7.3* 8.3*  HCT 24.7* 25.5* 25.0* 27.3*  PLT 563* 625* 582* 471*    COAGS: Recent Labs    01/28/20 1035  INR 1.4*    BMP: Recent Labs    02/01/20 0553 02/02/20 0437 02/03/20 0736 02/09/20 1055  NA 130* 135 136 140  K 4.2 4.3 3.8 3.7  CL 98 102 103 107  CO2 22 23 24 26   GLUCOSE 123* 103* 107* 95  BUN 15 21 17 14   CALCIUM 7.1* 7.2* 7.2* 7.5*  CREATININE 0.97 0.85 0.79 0.75  GFRNONAA >60 >60 >60 >60  GFRAA >60 >60 >60 >60    LIVER FUNCTION TESTS: Recent Labs    01/31/20 0323 01/31/20 0323 02/01/20 0553 02/02/20 0437 02/03/20 0736 02/09/20 1055  BILITOT 0.6  --  0.4 0.6  --  0.3  AST 74*  --  66* 60*  --  31  ALT 50*  --  50* 50*  --  30  ALKPHOS 187*  --  165* 165*  --  150*  PROT 5.9*  --   5.6* 5.8*  --  6.7  ALBUMIN 1.0*  1.0*   < > <1.0*  <1.0* <1.0*  <1.0* <1.0* 1.4*   < > = values in this interval not displayed.    Assessment: 69 year old male with history of GIST of the stomach, currently on G[place], who underwent image guided drainage of a right posterior hepatic abscess on 01/28/2020.  Cultures revealed Streptococcus intermedius.  He presents today for follow-up imaging and drain evaluation.   Patient presents today with concern that his drain was not working properly. He  reports decreased output from the past few days.  He is feeling well and has no change in his symptoms- denies new nausea, vomiting, abdominal pain, fevers, chills.    Initially presented for drain injection, however per IR MD he would benefit more from CT Abdomen Pelvis with contrast first which is obtained today and reviewed by Dr. Anselm Pancoast who notes improvement in the fluid component, however ongoing area of abnormality and edema.  Recommends monitoring output for the next several days with repeat assessment at the end of next week.  If output remains low, and no new symptoms present, would pull drain.  No repeat imaging needed at this time.   All of the above was discussed with the patient via interpreter.  He understands schedulers will contact him with date and time of appointment.   Signed: Docia Barrier, PA 03/03/2020, 12:53 PM   Please refer to Dr. Anselm Pancoast attestation of this note for management and plan.

## 2020-03-06 ENCOUNTER — Ambulatory Visit: Payer: Self-pay | Admitting: Nurse Practitioner

## 2020-03-08 ENCOUNTER — Ambulatory Visit: Payer: Self-pay | Attending: Nurse Practitioner | Admitting: Nurse Practitioner

## 2020-03-08 ENCOUNTER — Other Ambulatory Visit: Payer: Self-pay

## 2020-03-09 ENCOUNTER — Encounter: Payer: Self-pay | Admitting: *Deleted

## 2020-03-09 ENCOUNTER — Ambulatory Visit
Admission: RE | Admit: 2020-03-09 | Discharge: 2020-03-09 | Disposition: A | Discharge status: Home / Self Care | Payer: Self-pay | Source: Ambulatory Visit | Attending: Student | Admitting: Student

## 2020-03-09 DIAGNOSIS — K750 Abscess of liver: Secondary | ICD-10-CM

## 2020-03-09 HISTORY — PX: IR RADIOLOGIST EVAL & MGMT: IMG5224

## 2020-03-09 NOTE — Progress Notes (Signed)
Chief Complaint: Patient was seen in consultation today for hepatic drain management.  Referring Physician(s): Ned Card; Byerly,Faera  History of Present Illness: Kevin [name] is a 69 y.o. 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  Image guided a liver abscess drain was initially placed on 01/28/2020.  The drain was exchanged and repositioned on 02/18/2020.  Patient had a follow-up CT last week that demonstrated residual edema around the hepatic drain but the fluid component had resolved.  At that time, the patient was having minimal output.  Patient presents back to clinic today for drain follow-up with a translator.  Patient continues to have minimal output and sometimes no output from the drain.  He denies fevers or chills.  No abdominal pain.  Patient's daughter is still taking care of the drain and flushes.  No past medical history on file.  Past Surgical History:  Procedure Laterality Date  . BIOPSY  01/24/2020   Procedure: BIOPSY;  Surgeon: Wonda Horner, MD;  Location: [place];  Service: Endoscopy;;  . ESOPHAGOGASTRODUODENOSCOPY (EGD) WITH PROPOFOL N/A 01/24/2020   Procedure: ESOPHAGOGASTRODUODENOSCOPY (EGD) WITH PROPOFOL;  Surgeon: Wonda Horner, MD;  Location: [facility] ENDOSCOPY;  Service: Endoscopy;  Laterality: N/A;  . IR CATHETER TUBE CHANGE  02/18/2020  . IR RADIOLOGIST EVAL & MGMT  02/15/2020  . SCLEROTHERAPY  01/24/2020   Procedure: SCLEROTHERAPY;  Surgeon: Wonda Horner, MD;  Location: [facility] ENDOSCOPY;  Service: Endoscopy;;    Allergies: Patient has no known allergies.  Medications: Prior to Admission medications   Medication Sig Start Date End Date Taking? Authorizing Provider  bismuth subsalicylate (PEPTO-BISMOL) 262 MG/15ML suspension Take 30 mLs by mouth 4 (four) times daily -  before meals and at bedtime. Patient not taking: Reported on 02/09/2020 02/13/20   Mercy Riding, MD  ferrous sulfate 325 (65 FE) MG tablet Take 1 tablet (325 mg total) by mouth 2  (two) times daily with a meal. 01/28/20   Gonfa, Charlesetta Ivory, MD  imatinib (GLEEVEC) 400 MG tablet TAKE 1 TABLET (400 MG TOTAL) BY MOUTH DAILY. TAKE WITH MEALS AND LARGE GLASS OF WATER. CAUTION:CHEMOTHERAPY. 02/25/20   Ladell Pier, MD  mirtazapine (REMERON) 15 MG tablet Take 1 tablet (15 mg total) by mouth at bedtime. 02/03/20   Mercy Riding, MD  Multiple Vitamins-Minerals (MULTIVITAMIN WITH MINERALS) tablet Take 1 tablet by mouth daily. 01/28/20 07/26/20  Mercy Riding, MD  pantoprazole (PROTONIX) 40 MG tablet Take 1 tablet (40 mg total) by mouth 2 (two) times daily for 10 days, THEN 1 tablet (40 mg total) daily for 20 days. Patient not taking: Reported on 02/09/2020 02/13/20 03/14/20  Mercy Riding, MD  saccharomyces boulardii (FLORASTOR) 250 MG capsule Take 1 capsule (250 mg total) by mouth 2 (two) times daily. Patient not taking: Reported on 02/09/2020 02/03/20   Mercy Riding, MD  senna-docusate (SENOKOT S) 8.6-50 MG tablet Take 1 tablet by mouth daily. 01/28/20   Mercy Riding, MD  tetracycline (SUMYCIN) 250 MG capsule Take 1 capsule (250 mg total) by mouth 4 (four) times daily. Patient not taking: Reported on 02/09/2020 02/13/20   Mercy Riding, MD     Family History  Family history unknown: Yes    Social History   Socioeconomic History  . Marital status: Married    Spouse name: Not on file  . Number of children: 3  . Years of education: Not on file  . Highest education level: Not on file  Occupational History  . Not on file  Tobacco Use  . Smoking status: Current Every Day Smoker    Types: Cigarettes  . Smokeless tobacco: Never Used  . Tobacco comment: 01/27/2020 - Pt denied smoking cigarettes  Substance and Sexual Activity  . Alcohol use: Not Currently  . Drug use: Not on file  . Sexual activity: Not on file  Other Topics Concern  . Not on file  Social History Narrative  . Not on file   Social Determinants of Health   Financial Resource Strain:   . Difficulty of Paying Living  Expenses:   Food Insecurity:   . Worried About Charity fundraiser in the Last Year:   . Arboriculturist in the Last Year:   Transportation Needs:   . Film/video editor (Medical):   Marland Kitchen Lack of Transportation (Non-Medical):   Physical Activity:   . Days of Exercise per Week:   . Minutes of Exercise per Session:   Stress:   . Feeling of Stress :   Social Connections:   . Frequency of Communication with Friends and Family:   . Frequency of Social Gatherings with Friends and Family:   . Attends Religious Services:   . Active Member of Clubs or Organizations:   . Attends Archivist Meetings:   Marland Kitchen Marital Status:       Review of Systems  Constitutional: Negative for chills and fever.  Gastrointestinal: Negative for abdominal pain.    Vital Signs: There were no vitals taken for this visit.  Physical Exam Constitutional:      Appearance: Normal appearance. He is not ill-appearing.  Abdominal:     General: Abdomen is flat.     Palpations: Abdomen is soft.     Comments: Right upper abdominal drain is intact.  No drainage or erythema around the tube.  1 to 2 mL of mildly cloudy yellow fluid within the suction bulb.  Neurological:     Mental Status: He is alert.        Imaging: CT ABDOMEN PELVIS W CONTRAST  Result Date: 03/03/2020 CLINICAL DATA:  Hepatic abscess, post percutaneous drain catheter placement, with catheter revision 02/18/2020. Decreasing output. History of gastric GIST . EXAM: CT ABDOMEN AND PELVIS WITH CONTRAST TECHNIQUE: Multidetector CT imaging of the abdomen and pelvis was performed using the standard protocol following bolus administration of intravenous contrast. CONTRAST:  172mL OMNIPAQUE IOHEXOL 300 MG/ML  SOLN COMPARISON:  02/15/2020 FINDINGS: Lower chest: Trace right pleural effusion, decreased since previous. Improved aeration posteriorly at the right lung base. Hepatobiliary: Interval repositioning of hepatic abscess drain catheter.  Interval clearance of previously noted fluid component of the liver abscess. 6.7 x 5.4 cm inflammatory/of phlegmonous change in hepatic segment 7 centered around the drain catheter (previously 7.7 x 6.6). No new or undrained component. No new liver lesion. No biliary ductal dilatation. Gallbladder unremarkable. Pancreas: Unremarkable. No pancreatic ductal dilatation or surrounding inflammatory changes. Spleen: Normal in size without focal abnormality. Adrenals/Urinary Tract: Adrenals unremarkable. Stable upper pole left renal cyst. Focal parenchymal loss in the right kidney as before. No hydronephrosis. Urinary bladder physiologically distended. Stomach/Bowel: 12.8 cm hypoechoic/necrotic ulcerated mass posterior to the gastric body. Small bowel decompressed. Normal appendix. The colon is nondilated, unremarkable. Vascular/Lymphatic: Left para-aortic and aortocaval adenopathy stable. No vascular pathology identified. Reproductive: Prostate enlargement. Other: Trace scattered abdominal ascites, decreased compared to prior study. No free air. Musculoskeletal: Multilevel lower thoracic and lumbar spondylitic change. No fracture or worrisome bone lesion. IMPRESSION:  1. Interval repositioning of right hepatic abscess drain catheter with resolution of fluid component, some improvement in phlegmonous change. 2. Little change in large ulcerated mass posterior to the gastric body. 3. Decrease in small right pleural effusion and abdominal ascites. 4. Stable left para-aortic and aortocaval adenopathy. Electronically Signed   By: Lucrezia Europe M.D.   On: 03/03/2020 15:57   CT ABDOMEN PELVIS W CONTRAST  Result Date: 02/15/2020 CLINICAL DATA:  Recent fever, known large gastric gist tumor, enlarging indeterminate liver mass status post percutaneous drain compatible with abscess. Outpatient. EXAM: CT ABDOMEN AND PELVIS WITH CONTRAST TECHNIQUE: Multidetector CT imaging of the abdomen and pelvis was performed using the standard  protocol following bolus administration of intravenous contrast. CONTRAST:  132mL ISOVUE-300 IOPAMIDOL (ISOVUE-300) INJECTION 61% COMPARISON:  01/27/2020, 01/21/2020 FINDINGS: Lower chest: Small pleural effusions noted bilaterally, slightly larger and worse on the right. Minor associated dependent basilar atelectasis. Normal heart size. No pericardial effusion. Hepatobiliary: Patient is status post percutaneous drainage of the right posterior complex hepatic abnormality with enhancement and loculations. Drain catheter has retracted but remains within the periphery of the hepatic abscess. Overall the hepatic abnormality measures 8.5 cm in diameter. Largest residual loculation measures up to 4.3 cm. No new hepatic abnormality. No intrahepatic biliary dilatation. Hepatic and portal veins remain patent. Gallbladder and biliary system unremarkable. Common bile duct nondilated. Pancreas: Unremarkable. No pancreatic ductal dilatation or surrounding inflammatory changes. Spleen: Normal in size without focal abnormality. Adrenals/Urinary Tract: Normal adrenal glands. Stable renal cysts and cortical scarring bilaterally. No renal obstruction or hydronephrosis. No hydroureter. Ureters are symmetric and decompressed. Urinary bladder under distended. Stomach/Bowel: Overall slight interval decrease in size of the large exophytic centrally necrotic mass arising from the posterior proximal stomach. Measuring at similar levels the lesion roughly measures 13.03 x 9.0 cm, previously 17 x 12 cm. No associated obstruction pattern, ileus, or free air. Vascular/Lymphatic: Negative for aneurysm. Minor atherosclerotic change. No occlusive process. Mesenteric and renal vasculature all remain patent. No veno-occlusive disease. No bulky adenopathy. Reproductive: Prostate gland is enlarged. Symmetric seminal vesicles. Other: Slight increased volume of the abdominopelvic ascites overall volume remains small. No loculated fluid collection.  Musculoskeletal: Degenerative changes noted of the spine. No acute osseous finding. IMPRESSION: Overall some decrease in size of the posterior right hepatic complex fluid collection compatible with abscess. Retraction of the drain catheter to the peripheral aspect. Cultures grew Streptococcus intermedius. Slight decrease in size of the large exophytic centrally necrotic enhancing gastric mass posteriorly compatible with biopsy-proven GIST. Pleural effusions and basilar atelectasis, worse on the right. Slight increase in volume of abdominopelvic ascites. Electronically Signed   By: Jerilynn Mages.  Shick M.D.   On: 02/15/2020 10:27   IR Catheter Tube Change  Result Date: 02/18/2020 INDICATION: Hepatic abscess status post percutaneous drain. Residual undrained loculation by CT EXAM: FLUOROSCOPIC REPOSITION AND EXCHANGE OF THE RIGHT HEPATIC ABSCESS MEDICATIONS: 1% lidocaine local ANESTHESIA/SEDATION: Moderate Sedation Time:  None. The patient was continuously monitored during the procedure by the interventional radiology nurse under my direct supervision. COMPLICATIONS: None immediate. PROCEDURE: Informed written consent was obtained from the the patient with an interpreter after a thorough discussion of the procedural risks, benefits and alternatives. All questions were addressed. Maximal Sterile Barrier Technique was utilized including caps, mask, sterile gowns, sterile gloves, sterile drape, hand hygiene and skin antiseptic. A timeout was performed prior to the initiation of the procedure. Under sterile conditions and local anesthesia, the existing abscess catheter was injected with contrast confirming a residual loculation more  medial. This correlates with the CT. Catheter was repositioned and exchanged over an Amplatz guidewire for a new 12 French catheter position in the residual dominant loculation. Syringe aspiration yielded 18 cc purulent fluid. Sample sent for culture. Catheter secured with Prolene suture and  connected to external suction bulb. Sterile dressing applied. No immediate complication. Patient tolerated the procedure well. IMPRESSION: Successful fluoroscopic exchange and reposition of the right hepatic abscess within the residual dominant loculation. 18 cc pus aspirated. Culture sent. Electronically Signed   By: Jerilynn Mages.  Shick M.D.   On: 02/18/2020 10:27   IR Radiologist Eval & Mgmt  Result Date: 02/15/2020 Please refer to notes tab for details about interventional procedure. (Op Note)   Labs:  CBC: Recent Labs    02/01/20 0553 02/02/20 0437 02/03/20 0736 02/09/20 1055  WBC 11.1* 9.0 7.1 4.7  HGB 7.4* 7.5* 7.3* 8.3*  HCT 24.7* 25.5* 25.0* 27.3*  PLT 563* 625* 582* 471*    COAGS: Recent Labs    01/28/20 1035  INR 1.4*    BMP: Recent Labs    02/01/20 0553 02/02/20 0437 02/03/20 0736 02/09/20 1055  NA 130* 135 136 140  K 4.2 4.3 3.8 3.7  CL 98 102 103 107  CO2 22 23 24 26   GLUCOSE 123* 103* 107* 95  BUN 15 21 17 14   CALCIUM 7.1* 7.2* 7.2* 7.5*  CREATININE 0.97 0.85 0.79 0.75  GFRNONAA >60 >60 >60 >60  GFRAA >60 >60 >60 >60    LIVER FUNCTION TESTS: Recent Labs    01/31/20 0323 01/31/20 0323 02/01/20 0553 02/02/20 0437 02/03/20 0736 02/09/20 1055  BILITOT 0.6  --  0.4 0.6  --  0.3  AST 74*  --  66* 60*  --  31  ALT 50*  --  50* 50*  --  30  ALKPHOS 187*  --  165* 165*  --  150*  PROT 5.9*  --  5.6* 5.8*  --  6.7  ALBUMIN 1.0*  1.0*   < > <1.0*  <1.0* <1.0*  <1.0* <1.0* 1.4*   < > = values in this interval not displayed.    TUMOR MARKERS: No results for input(s): AFPTM, CEA, CA199, CHROMGRNA in the last 8760 hours.  Assessment and Plan:   69 year old male with history of a hepatic abscess and GIST of the stomach.  Recent imaging demonstrates that the fluid component of the liver abscess has resolved.  There continues to be edema or phlegmonous changes around the drain.  Patient has had minimal output since the CT scan on 03/03/2020.  As result, the  drain was removed at today's visit.  I instructed the patient that he may have mild drainage for a few days.  In addition, the drain was near the right lung base and told the patient that a pneumothorax or pleural effusion following removal is possible.  I instructed him to contact us if he has new shortness of breath or chest pain.  Instructed patient to continue his other regular follow-up with oncology.  No plans for additional IR visits at this time.  Thank you for this interesting consult.  I greatly enjoyed meeting Kevin [name] and look forward to participating in their care.  A copy of this report was sent to the requesting provider on this date.  Electronically Signed: Burman Riis 03/09/2020, 12:01 PM   I spent a total of    10 Minutes in face to face in clinical consultation, greater than 50% of which was counseling/coordinating  care for drain management.  patient ID: Kevin [name], male   DOB: 1950/12/15, 69 y.o.   MRN: [id number]

## 2020-03-13 ENCOUNTER — Other Ambulatory Visit: Payer: Self-pay

## 2020-03-13 ENCOUNTER — Other Ambulatory Visit: Payer: Self-pay | Admitting: Hospice

## 2020-03-13 DIAGNOSIS — C49A2 Gastrointestinal stromal tumor of stomach: Secondary | ICD-10-CM

## 2020-03-13 DIAGNOSIS — Z515 Encounter for palliative care: Secondary | ICD-10-CM

## 2020-03-13 NOTE — Progress Notes (Signed)
Kevin [name]: [phone]  Fax: [phone]  PATIENT NAME: Kevin [name] DOB: 1950-12-28 MRN: [id number]  PRIMARY CARE PROVIDER:    Ladell Pier, MD REFERRING PROVIDER:  Ladell Pier, MD Kevin [name],  [place] [address]  RESPONSIBLE PARTYVivi [name]: ID:[id number] c TELEHEALTH VISIT STATEMENT Due to the COVID-19 crisis, this visit was done via [name] from my office. It was initiated and consented to by this patient and/or family.  RECOMMENDATIONS/PLAN:   Advance Care Planning/Goals of Care: Visit at the request of  Ladell Pier, MD for palliative consult. NP at his home for scheduled visit; patient was out with his brother, available via telehealth while NP at home with [name]. Visit consisted of building trust and discussions on Palliative Medicine as specialized medical care for people living with serious illness, aimed at facilitating better quality of life through symptoms relief, assisting with advance care plan and establishing goals of care. Patient is a DNR;  Kevin [name] not sure where the signed DNR is at home. NP signed DNR form; also uploaded to Epic. Goals of care include to maximize quality of life and symptom management. Family is open to Hospice services in the future but will not like patient to be sent to Hospice home. Patient's step daughter 06-Apr-2023 died of stomach cancer 06-Aug-2019; she died in a nursing home facility and could not be seen by family prior to two days to her death. Therapeutic listening and emotional support provided as Kevin [name] shared family experiences.  Symptom management: Patient denied pain/discomfort. Bleeding from rectum, nausea/vomiting all resolved. 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 He has Zofran for nausea. His appetite is  fair, on and off. Encouraged Mirtazapine as ordered. Weight is stable at this time. Plan is to do surgery for the tumor after taking Gleevec as ordered. [name] reports patient is independent in ADLs, gets SOB on moderate exertion. No acute concern.  Follow up: Palliative care will continue to follow patient for goals of care clarification and symptom management. Another in person visit scheduled for 03/30/2020 at patient's request. I spent one hour and 18 minutes providing this consultation; time includes chart review and documentation. More than 50% of the time in this consultation was spent on coordinating communication  HISTORY OF PRESENT ILLNESS:  Kevin [name] is a 69 y.o. year old male with multiple medical problems including malignant DI stromal tumor of stomach. Palliative Care was asked to help address goals of care.   CODE STATUS: DNR  PPS: 60% HOSPICE ELIGIBILITY/DIAGNOSIS: TBD  PAST MEDICAL HISTORY: No past medical history on file.  SOCIAL HX:  Social History   Tobacco Use  . Smoking status: Current Every Day Smoker    Types: Cigarettes  . Smokeless tobacco: Never Used  . Tobacco comment: 01/27/2020 - Pt denied smoking cigarettes  Substance Use Topics  . Alcohol use: Not Currently    ALLERGIES: No Known Allergies   PERTINENT MEDICATIONS:  Outpatient Encounter Medications as of 03/13/2020  Medication Sig  . bismuth subsalicylate (PEPTO-BISMOL) 262 MG/15ML suspension Take 30 mLs by mouth 4 (four) times daily -  before meals and at bedtime. (Patient not taking: Reported on 02/09/2020)  . ferrous sulfate 325 (65 FE) MG tablet Take 1 tablet (325 mg total) by mouth 2 (two) times daily with a meal.  .  imatinib (GLEEVEC) 400 MG tablet TAKE 1 TABLET (400 MG TOTAL) BY MOUTH DAILY. TAKE WITH MEALS AND LARGE GLASS OF WATER. CAUTION:CHEMOTHERAPY.  . mirtazapine (REMERON) 15 MG tablet Take 1 tablet (15 mg total) by mouth at bedtime.  . Multiple Vitamins-Minerals (MULTIVITAMIN WITH MINERALS)  tablet Take 1 tablet by mouth daily.  . pantoprazole (PROTONIX) 40 MG tablet Take 1 tablet (40 mg total) by mouth 2 (two) times daily for 10 days, THEN 1 tablet (40 mg total) daily for 20 days. (Patient not taking: Reported on 02/09/2020)  . saccharomyces boulardii (FLORASTOR) 250 MG capsule Take 1 capsule (250 mg total) by mouth 2 (two) times daily. (Patient not taking: Reported on 02/09/2020)  . senna-docusate (SENOKOT S) 8.6-50 MG tablet Take 1 tablet by mouth daily.  Marland Kitchen tetracycline (SUMYCIN) 250 MG capsule Take 1 capsule (250 mg total) by mouth 4 (four) times daily. (Patient not taking: Reported on 02/09/2020)   No facility-administered encounter medications on file as of 03/13/2020.    Teodoro Spray, NP

## 2020-03-16 MED FILL — PANTOPRAZOLE SOD DR 40 MG T: 40 | 30 days supply | Qty: 40 | Fill #0

## 2020-03-22 ENCOUNTER — Other Ambulatory Visit: Payer: Self-pay | Admitting: Nurse Practitioner

## 2020-03-22 DIAGNOSIS — C49A2 Gastrointestinal stromal tumor of stomach: Secondary | ICD-10-CM

## 2020-03-24 ENCOUNTER — Other Ambulatory Visit: Payer: Self-pay | Admitting: Oncology

## 2020-03-29 ENCOUNTER — Inpatient Hospital Stay: Payer: Self-pay

## 2020-03-29 ENCOUNTER — Inpatient Hospital Stay: Payer: Self-pay | Attending: Nurse Practitioner | Admitting: Nurse Practitioner

## 2020-03-29 DIAGNOSIS — C49A2 Gastrointestinal stromal tumor of stomach: Secondary | ICD-10-CM | POA: Insufficient documentation

## 2020-03-29 DIAGNOSIS — D509 Iron deficiency anemia, unspecified: Secondary | ICD-10-CM | POA: Insufficient documentation

## 2020-03-29 DIAGNOSIS — E46 Unspecified protein-calorie malnutrition: Secondary | ICD-10-CM | POA: Insufficient documentation

## 2020-03-29 MED FILL — IMATINIB MESYLATE 400 MG TA: 400 | 30 days supply | Qty: 30 | Fill #0

## 2020-03-30 ENCOUNTER — Other Ambulatory Visit: Payer: Self-pay

## 2020-03-30 ENCOUNTER — Other Ambulatory Visit: Payer: Self-pay | Admitting: Hospice

## 2020-03-30 DIAGNOSIS — C49A2 Gastrointestinal stromal tumor of stomach: Secondary | ICD-10-CM

## 2020-03-30 DIAGNOSIS — Z515 Encounter for palliative care: Secondary | ICD-10-CM

## 2020-03-30 NOTE — Progress Notes (Signed)
[facility] Consult Note Telephone: [phone]  Fax: [phone]  PATIENT NAME: Kevin [name] DOB: 09/10/1951 MRN: [id number]  PRIMARY CARE PROVIDER:    Ladell Pier, MD REFERRING PROVIDER:  Ladell Pier, MD [address] [place],  [place] [address]  RESPONSIBLE PARTY:  Self  Loura Pardon and [place]: ID:[id number] c Delmon Kondo [phone] Patient's phone number [phone]   RECOMMENDATIONS/PLAN:   Advance Care Planning/Goals of Care: Patient, Kevin [name] and [place]- patient's friend present during visit. Renaldo interpreting. Patient affirmed he wishes to remain a DNR. Goals of care include to maximize quality of life and symptom management. Patient/family is open to Hospice services in the future but will not like patient to be sent to Hospice home. After discussions and questions answered, MOST form signed today. MOST selections include limited additional intervention, feeding tube, antibiotics/IV fluids as indicated. MOST form uploaded to Epic.   Symptom management: Patient denied pain/discomfort; he continues with occasional small rectal bleed; no nausea/vomiting. 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 He has Zofran for nausea. His appetite is good. He continues on  Mirtazapine as ordered. Weight is stable at this time. [name] affirmed that plan is to do surgery for the tumor resection after taking Gleevec as ordered. Patient is independent in ADLs, gets SOB on moderate exertion. He is ambulatory without device. No acute concerns at this time. Encouraged ongoing care and keeping to all scheduled appointments.  Follow up: Palliative care will continue to follow patient for goals of care clarification and symptom management.  I spent one hour and 30 minutes providing this consultation; time includes chart  review and documentation. More than 50% of the time in this consultation was spent on coordinating communication  HISTORY OF PRESENT ILLNESS:  Kevin [name] is a 69 y.o. year old male with multiple medical problems including malignant DI stromal tumor of stomach. Palliative Care was asked to help address goals of care.   CODE STATUS: DNR  PPS: 60% HOSPICE ELIGIBILITY/DIAGNOSIS: TBD  PAST MEDICAL HISTORY: No past medical history on file.  SOCIAL HX:  Social History   Tobacco Use  . Smoking status: Current Every Day Smoker    Types: Cigarettes  . Smokeless tobacco: Never Used  . Tobacco comment: 01/27/2020 - Pt denied smoking cigarettes  Substance Use Topics  . Alcohol use: Not Currently    ALLERGIES: No Known Allergies   PERTINENT MEDICATIONS:  Outpatient Encounter Medications as of 03/30/2020  Medication Sig  . bismuth subsalicylate (PEPTO-BISMOL) 262 MG/15ML suspension Take 30 mLs by mouth 4 (four) times daily -  before meals and at bedtime. (Patient not taking: Reported on 02/09/2020)  . ferrous sulfate 325 (65 FE) MG tablet Take 1 tablet (325 mg total) by mouth 2 (two) times daily with a meal.  . imatinib (GLEEVEC) 400 MG tablet TAKE 1 TABLET (400 MG TOTAL) BY MOUTH DAILY. TAKE WITH MEALS AND LARGE GLASS OF WATER. CAUTION:CHEMOTHERAPY.  . mirtazapine (REMERON) 15 MG tablet Take 1 tablet (15 mg total) by mouth at bedtime.  . Multiple Vitamins-Minerals (MULTIVITAMIN WITH MINERALS) tablet Take 1 tablet by mouth daily.  . pantoprazole (PROTONIX) 40 MG tablet Take 1 tablet (40 mg total) by mouth 2 (two) times daily for 10 days, THEN 1 tablet (40 mg total) daily for 20 days. (Patient not taking: Reported on 02/09/2020)  . saccharomyces  boulardii (FLORASTOR) 250 MG capsule Take 1 capsule (250 mg total) by mouth 2 (two) times daily. (Patient not taking: Reported on 02/09/2020)  . senna-docusate (SENOKOT S) 8.6-50 MG tablet Take 1 tablet by mouth daily.  Marland Kitchen tetracycline (SUMYCIN) 250 MG capsule  Take 1 capsule (250 mg total) by mouth 4 (four) times daily. (Patient not taking: Reported on 02/09/2020)   No facility-administered encounter medications on file as of 03/30/2020.    PHYSICAL EXAM:   General: NAD, cooperative Cardiovascular: regular rate and rhythm Pulmonary: clear ant fields Abdomen: soft, nontender, + bowel sounds GU: no suprapubic tenderness Extremities: no edema, no joint deformities Skin: no rashes to exposed skin Neurological: Weakness but otherwise nonfocal  Teodoro Spray, NP

## 2020-04-03 ENCOUNTER — Telehealth: Payer: Self-pay | Admitting: *Deleted

## 2020-04-03 ENCOUNTER — Ambulatory Visit: Payer: Self-pay | Admitting: Nurse Practitioner

## 2020-04-03 NOTE — Telephone Encounter (Signed)
Unable to reach patient at his home #. Interpreter, Almyra Free was able to speak w/friend Blanch Media, who reports Mr. Ouimette lives w/him. Appointment information was provided and he will bring him

## 2020-04-06 ENCOUNTER — Telehealth: Payer: Self-pay | Admitting: *Deleted

## 2020-04-06 ENCOUNTER — Other Ambulatory Visit: Payer: Self-pay

## 2020-04-06 ENCOUNTER — Telehealth: Payer: Self-pay | Admitting: Oncology

## 2020-04-06 ENCOUNTER — Inpatient Hospital Stay: Payer: Self-pay

## 2020-04-06 ENCOUNTER — Inpatient Hospital Stay (HOSPITAL_BASED_OUTPATIENT_CLINIC_OR_DEPARTMENT_OTHER): Payer: Self-pay | Admitting: Oncology

## 2020-04-06 VITALS — BP 146/80 | HR 84 | Temp 98.5°F | Resp 17 | Ht 66.0 in | Wt 144.4 lb

## 2020-04-06 DIAGNOSIS — C49A2 Gastrointestinal stromal tumor of stomach: Secondary | ICD-10-CM

## 2020-04-06 LAB — CMP (CANCER CENTER ONLY)
ALT: 17 U/L (ref 0–44)
AST: 24 U/L (ref 15–41)
Albumin: 3.1 g/dL — ABNORMAL LOW (ref 3.5–5.0)
Alkaline Phosphatase: 123 U/L (ref 38–126)
Anion gap: 6 (ref 5–15)
BUN: 17 mg/dL (ref 8–23)
CO2: 25 mmol/L (ref 22–32)
Calcium: 8.2 mg/dL — ABNORMAL LOW (ref 8.9–10.3)
Chloride: 106 mmol/L (ref 98–111)
Creatinine: 0.88 mg/dL (ref 0.61–1.24)
GFR, Est AFR Am: >60 mL/min (ref >60)
GFR, Estimated: >60 mL/min (ref >60)
Glucose, Bld: 145 mg/dL — ABNORMAL HIGH (ref 70–99)
Potassium: 3.5 mmol/L (ref 3.5–5.1)
Sodium: 137 mmol/L (ref 135–145)
Total Bilirubin: 0.4 mg/dL (ref 0.3–1.2)
Total Protein: 7.8 g/dL (ref 6.5–8.1)

## 2020-04-06 LAB — CBC WITH DIFFERENTIAL (CANCER CENTER ONLY)
Abs Immature Granulocytes: 0.01 10*3/uL (ref 0.00–0.07)
Basophils Absolute: 0 10*3/uL (ref 0.0–0.1)
Basophils Relative: 0 %
Eosinophils Absolute: 0.1 10*3/uL (ref 0.0–0.5)
Eosinophils Relative: 3 %
HCT: 34 % — ABNORMAL LOW (ref 39.0–52.0)
Hemoglobin: 11.2 g/dL — ABNORMAL LOW (ref 13.0–17.0)
Immature Granulocytes: 0 %
Lymphocytes Relative: 40 %
Lymphs Abs: 1.8 10*3/uL (ref 0.7–4.0)
MCH: 31.1 pg (ref 26.0–34.0)
MCHC: 32.9 g/dL (ref 30.0–36.0)
MCV: 94.4 fL (ref 80.0–100.0)
Monocytes Absolute: 0.4 10*3/uL (ref 0.1–1.0)
Monocytes Relative: 10 %
Neutro Abs: 2 10*3/uL (ref 1.7–7.7)
Neutrophils Relative %: 47 %
Platelet Count: 212 10*3/uL (ref 150–400)
RBC: 3.6 MIL/uL — ABNORMAL LOW (ref 4.22–5.81)
RDW: 15.9 % — ABNORMAL HIGH (ref 11.5–15.5)
WBC Count: 4.4 10*3/uL (ref 4.0–10.5)
nRBC: 0 % (ref 0.0–0.2)

## 2020-04-06 MED ORDER — PANTOPRAZOLE SODIUM 40 MG PO TBEC: 40.0000 mg | DELAYED_RELEASE_TABLET | Freq: Every day | ORAL | 2 refills | Status: DC

## 2020-04-06 NOTE — Telephone Encounter (Signed)
Left VM at both #'s requesting return call with daughter's phone # to call and discuss his current medications.

## 2020-04-06 NOTE — Progress Notes (Signed)
[facility] OFFICE PROGRESS NOTE   Diagnosis: Gastrointestinal stromal tumor INTERVAL HISTORY:   [name] returns after missing a scheduled appointment last month.  He is here with a [name] interpreter.  [name] feels well.  He reports taking Gleevec daily.  No rash or diarrhea.  Good appetite.  He reports feeling much better compared to when he was hospitalized at [facility].  The hepatic abscess drain was removed on 03/09/2020.   Objective:  Vital signs in last 24 hours:  Blood pressure (!) 146/80, pulse 84, temperature 98.5 F (36.9 C), temperature source Temporal, resp. rate 17, height 5\' 6"  (1.676 m), weight 144 lb 6.4 oz (65.5 kg), SpO2 100 %.    Resp: Lungs clear bilaterally Cardio: Regular rate and rhythm GI: No mass, no hepatosplenomegaly, no apparent ascites Vascular: No leg edema  Skin: No rash   Lab Results:  Lab Results  Component Value Date   WBC 4.4 04/06/2020   HGB 11.2 (L) 04/06/2020   HCT 34.0 (L) 04/06/2020   MCV 94.4 04/06/2020   PLT 212 04/06/2020   NEUTROABS 2.0 04/06/2020    CMP  Lab Results  Component Value Date   NA 137 04/06/2020   K 3.5 04/06/2020   CL 106 04/06/2020   CO2 25 04/06/2020   GLUCOSE 145 (H) 04/06/2020   BUN 17 04/06/2020   CREATININE 0.88 04/06/2020   CALCIUM 8.2 (L) 04/06/2020   PROT 7.8 04/06/2020   ALBUMIN 3.1 (L) 04/06/2020   AST 24 04/06/2020   ALT 17 04/06/2020   ALKPHOS 123 04/06/2020   BILITOT 0.4 04/06/2020   GFRNONAA >60 04/06/2020   GFRAA >60 04/06/2020    Medications: I have reviewed the patient's current medications.   Assessment/Plan: 1.GISTof the stomach  CT abdomen/pelvis 01/21/2020-large necrotic mass centered in the posterior wall of the stomach, irregular hypodense mass right lobe liver   Upper endoscopy 01/24/2020-large infiltrative mass with no bleeding found in the posterior wall of the stomach.  Biopsy-gastrointestinal stromal tumor   MRI liver 01/28/2020-large exophytic  centrally necrotic 17.3 x 12.3 cm gastric mass arising from posterior proximal stomach centered in the left upper quadrant with thick enhancing wall.  Complex multilocular 10.7 x 8.2 cm cystic liver mass right liver lobe.    Neoadjuvant Gleevec 01/28/2020  CT 03/03/2020, resolution of fluid component of right hepatic abscess, unchanged mass posterior to the gastric body, unchanged left periaortic and aortocaval adenopathy 2. Liver lesion in the right lobe             -Liver mass consistent with abscess on MRI of the liver from 01/27/2020             -Status post CT-guided drainage of the liver abscess on 01/28/2020-culture positive for Streptococcus intermedius  -IV antibiotics and discharged home on 10-day course of Augmentin             -CT 03/03/2020-clearance of fluid component of liver abscess, unchanged mass posterior to the gastric body, stable left periaortic and aortocaval adenopathy             -Abscess drain removed 03/09/2020 3. Iron deficiency anemia-oral iron, improved 4. Gastritis and H. pylori infection 5.  History of fever secondary to the liver abscess 6. Protein calorie malnutrition    Disposition: [name] has been maintained on [place] for the past 2-1/2 months.  His clinical status is much improved.  He has gained weight and the hemoglobin is higher.  He appears to be tolerating the  [place] well.  He will continue Gleevec.  We will schedule a restaging CT for within the next few months.  I encouraged him to obtain the COVID-19 vaccine.  [name] will return for an office and lab visit in 1 month.  Betsy Coder, MD  04/06/2020  2:05 PM

## 2020-04-06 NOTE — Telephone Encounter (Signed)
Scheduled per los. Gave avs and calendar  

## 2020-04-10 ENCOUNTER — Ambulatory Visit: Payer: Self-pay | Attending: Internal Medicine

## 2020-04-10 DIAGNOSIS — Z23 Encounter for immunization: Secondary | ICD-10-CM

## 2020-04-10 NOTE — Progress Notes (Signed)
   Covid-19 Vaccination Clinic  Name:  Kevin [name]    MRN: [id number] DOB: Mar 28, 1951  04/10/2020  Kevin [name] was observed post Covid-19 immunization for 15 minutes without incident. He was provided with Vaccine Information Sheet and instruction to access the V-Safe system.   Kevin [name] was instructed to call 911 with any severe reactions post vaccine: Marland Kitchen Difficulty breathing  . Swelling of face and throat  . A fast heartbeat  . A bad rash all over body  . Dizziness and weakness   Immunizations Administered    Name Date Dose VIS Date Route   Pfizer COVID-19 Vaccine 04/10/2020 11:25 AM 0.3 mL 02/02/2019 Intramuscular   Manufacturer: [place]   Lot: [id number]   [place]: [id number]

## 2020-04-12 MED FILL — PANTOPRAZOLE SOD DR 40 MG T: 40 | 30 days supply | Qty: 40 | Fill #0

## 2020-04-12 MED FILL — MIRTAZAPINE 15 MG TABLET: 15 | 30 days supply | Qty: 30 | Fill #1

## 2020-04-13 ENCOUNTER — Other Ambulatory Visit: Payer: Self-pay | Admitting: *Deleted

## 2020-04-13 MED ORDER — IMATINIB MESYLATE 400 MG PO TABS: 400.0000 mg | ORAL_TABLET | Freq: Every day | ORAL | 0 refills | Status: DC

## 2020-04-13 NOTE — Progress Notes (Signed)
Refilled Gleevec today and called Almyra Free, [place] interpreter to help contact daughter tomorrow to discuss his meds.

## 2020-04-14 NOTE — Telephone Encounter (Signed)
Was able to obtain phone # for his daughter, Kevin [name] with assistance of hospital interpreter. Left VM requesting return call to discuss Kevin [name]'s meds and refills.

## 2020-04-19 ENCOUNTER — Telehealth: Payer: Self-pay | Admitting: *Deleted

## 2020-04-19 NOTE — Telephone Encounter (Signed)
Spoke w/daughter and she confirms he is taking the [place] daily. May miss a dose on occasion. They have already picked up his refill. Requested for next appointment to have him put all the meds he is taking in a bag and bring to the visit to review. She agrees.

## 2020-04-24 ENCOUNTER — Other Ambulatory Visit: Payer: Self-pay

## 2020-04-24 ENCOUNTER — Other Ambulatory Visit: Payer: Self-pay | Admitting: Hospice

## 2020-04-24 DIAGNOSIS — Z515 Encounter for palliative care: Secondary | ICD-10-CM

## 2020-04-24 DIAGNOSIS — C49A2 Gastrointestinal stromal tumor of stomach: Secondary | ICD-10-CM

## 2020-04-24 NOTE — Progress Notes (Signed)
[facility] Consult Note Telephone: [phone]  Fax: [phone]  PATIENT NAME: Tahjee Monday DOB: 26-Mar-1951 MRN: [id number]  RESPONSIBLE PARTY:Self Vivi Barrack: ID:[id number] c Denman George Ferran Galeano [phone] Patient's phone number [phone]    TELEHEALTH VISIT STATEMENT Due to the COVID-19 crisis, this visit was done via telephone from my office. It was initiated and consented to by this patient and/or family.    RECOMMENDATIONS/PLAN:  Advance Care Planning/Goals of Care: Telehealth visit to build trust and check up on patient status.  Patient and her daughter [name] were together during visit; [name] interpreting.  Patient  remains a DNR. Goals of care include to maximize quality of life and symptom management.Patient/family is open to Hospice services in the future but will not likepatient to be sent toHospice home.  MOST selections include limited additional intervention, feeding tube, antibiotics/IV fluids as indicated. MOST form uploaded to Epic.   Visit consisted of counseling and education dealing with the complex and emotionally intense issues of symptom management and palliative care in the setting of serious and potentially life-threatening illness. Palliative care team will continue to support patient, patient's family, and medical team. Symptom management:Patient denied pain/discomfort; no report of rectal bleed, no nausea or vomiting.  Patient was seen at the cancer center 04/06/2020: No acute findings, no change to plan of care.  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 Weight is stable at this time; appetite is good. [name] reported that patient has appointment at the cancer center 04/29/2020 for restaging CT. Patient is independent in ADLs, gets SOB  on moderate exertion. He is ambulatory without device. No acute concerns at this time. Encouraged ongoing care and keeping to all scheduled appointments.  Follow [id number] care will continue to follow patient for goals of care clarification and symptom management.  I spent46 minutesproviding this consultation; time includes chart review and documentation. More than 50% of the time in this consultation was spent on coordinating communication  HISTORY OF PRESENT ILLNESS:Rykker Sanchezis a 69 y.o.year oldmalewith multiple medical problems including malignant DI stromal tumor of stomach. Palliative Care was asked to help address goals of care.   CODE STATUS: DNR  PPS: 60% HOSPICE ELIGIBILITY/DIAGNOSIS: TBD  PAST MEDICAL HISTORY: No past medical history on file.  SOCIAL HX:  Social History   Tobacco Use  . Smoking status: Current Every Day Smoker    Types: Cigarettes  . Smokeless tobacco: Never Used  . Tobacco comment: 01/27/2020 - Pt denied smoking cigarettes  Substance Use Topics  . Alcohol use: Not Currently    ALLERGIES: No Known Allergies   PERTINENT MEDICATIONS:  Outpatient Encounter Medications as of 04/24/2020  Medication Sig  . ferrous sulfate 325 (65 FE) MG tablet Take 1 tablet (325 mg total) by mouth 2 (two) times daily with a meal.  . imatinib (GLEEVEC) 400 MG tablet Take 1 tablet (400 mg total) by mouth daily. Take with meals and large glass of water.Caution:Chemotherapy.  . mirtazapine (REMERON) 15 MG tablet Take 1 tablet (15 mg total) by mouth at bedtime.  . Multiple Vitamins-Minerals (MULTIVITAMIN WITH MINERALS) tablet Take 1 tablet by mouth daily.  . pantoprazole (PROTONIX) 40 MG tablet Take 1 tablet (40 mg total) by mouth daily.  Marland Kitchen saccharomyces boulardii (FLORASTOR) 250 MG capsule Take 1 capsule (250 mg total) by mouth 2 (two) times  daily.  . senna-docusate (SENOKOT S) 8.6-50 MG tablet Take 1 tablet by mouth daily.  Marland Kitchen tetracycline (SUMYCIN) 250 MG capsule  Take 1 capsule (250 mg total) by mouth 4 (four) times daily.   No facility-administered encounter medications on file as of 04/24/2020.    Teodoro Spray, NP

## 2020-05-01 ENCOUNTER — Ambulatory Visit: Payer: Self-pay | Attending: Internal Medicine

## 2020-05-01 DIAGNOSIS — Z23 Encounter for immunization: Secondary | ICD-10-CM

## 2020-05-01 NOTE — Progress Notes (Signed)
   Covid-19 Vaccination Clinic  Name:  Kevin [name]    MRN: [id number] DOB: 06/27/1951  05/01/2020  [name] was observed post Covid-19 immunization for 15 minutes without incident. He was provided with Vaccine Information Sheet and instruction to access the V-Safe system.   [name] was instructed to call 911 with any severe reactions post vaccine: Marland Kitchen Difficulty breathing  . Swelling of face and throat  . A fast heartbeat  . A bad rash all over body  . Dizziness and weakness   Immunizations Administered    Name Date Dose VIS Date Route   Pfizer COVID-19 Vaccine 05/01/2020 11:46 AM 0.3 mL 02/02/2019 Intramuscular   Manufacturer: [place]   Lot: [id number]   [place]: [id number]

## 2020-05-01 NOTE — Progress Notes (Signed)
   Covid-19 Vaccination Clinic  Name:  Ervil Lainhart    MRN: [id number] DOB: 05-06-1951  05/01/2020  Mr. Ranft was observed post Covid-19 immunization for 15 minutes without incident. He was provided with Vaccine Information Sheet and instruction to access the V-Safe system.   Mr. Dobias was instructed to call 911 with any severe reactions post vaccine: Marland Kitchen Difficulty breathing  . Swelling of face and throat  . A fast heartbeat  . A bad rash all over body  . Dizziness and weakness   Immunizations Administered    Name Date Dose VIS Date Route   Pfizer COVID-19 Vaccine 05/01/2020 11:46 AM 0.3 mL 02/02/2019 Intramuscular   Manufacturer: [place]   Lot: [id number]   [place]: [id number]    interpretor used Shanon Brow [id number]

## 2020-05-03 MED FILL — IMATINIB MESYLATE 400 MG TA: 400 | 30 days supply | Qty: 30 | Fill #0

## 2020-05-04 ENCOUNTER — Inpatient Hospital Stay: Payer: Self-pay

## 2020-05-04 ENCOUNTER — Inpatient Hospital Stay: Payer: Self-pay | Attending: Nurse Practitioner | Admitting: Nurse Practitioner

## 2020-05-04 ENCOUNTER — Encounter: Payer: Self-pay | Admitting: Nurse Practitioner

## 2020-05-04 VITALS — BP 169/86 | HR 80 | Temp 97.9°F | Resp 18 | Ht 66.0 in | Wt 149.7 lb

## 2020-05-04 DIAGNOSIS — C49A2 Gastrointestinal stromal tumor of stomach: Secondary | ICD-10-CM | POA: Insufficient documentation

## 2020-05-04 DIAGNOSIS — D709 Neutropenia, unspecified: Secondary | ICD-10-CM | POA: Insufficient documentation

## 2020-05-04 DIAGNOSIS — D509 Iron deficiency anemia, unspecified: Secondary | ICD-10-CM | POA: Insufficient documentation

## 2020-05-04 LAB — CBC WITH DIFFERENTIAL (CANCER CENTER ONLY)
Abs Immature Granulocytes: 0.01 10*3/uL (ref 0.00–0.07)
Basophils Absolute: 0 10*3/uL (ref 0.0–0.1)
Basophils Relative: 0 %
Eosinophils Absolute: 0.1 10*3/uL (ref 0.0–0.5)
Eosinophils Relative: 4 %
HCT: 34.7 % — ABNORMAL LOW (ref 39.0–52.0)
Hemoglobin: 12 g/dL — ABNORMAL LOW (ref 13.0–17.0)
Immature Granulocytes: 0 %
Lymphocytes Relative: 48 %
Lymphs Abs: 1.8 10*3/uL (ref 0.7–4.0)
MCH: 32 pg (ref 26.0–34.0)
MCHC: 34.6 g/dL (ref 30.0–36.0)
MCV: 92.5 fL (ref 80.0–100.0)
Monocytes Absolute: 0.4 10*3/uL (ref 0.1–1.0)
Monocytes Relative: 12 %
Neutro Abs: 1.3 10*3/uL — ABNORMAL LOW (ref 1.7–7.7)
Neutrophils Relative %: 36 %
Platelet Count: 229 10*3/uL (ref 150–400)
RBC: 3.75 MIL/uL — ABNORMAL LOW (ref 4.22–5.81)
RDW: 12.9 % (ref 11.5–15.5)
WBC Count: 3.7 10*3/uL — ABNORMAL LOW (ref 4.0–10.5)
nRBC: 0 % (ref 0.0–0.2)

## 2020-05-04 LAB — CMP (CANCER CENTER ONLY)
ALT: 26 U/L (ref 0–44)
AST: 32 U/L (ref 15–41)
Albumin: 3.6 g/dL (ref 3.5–5.0)
Alkaline Phosphatase: 100 U/L (ref 38–126)
Anion gap: 8 (ref 5–15)
BUN: 13 mg/dL (ref 8–23)
CO2: 25 mmol/L (ref 22–32)
Calcium: 8.4 mg/dL — ABNORMAL LOW (ref 8.9–10.3)
Chloride: 107 mmol/L (ref 98–111)
Creatinine: 0.9 mg/dL (ref 0.61–1.24)
GFR, Est AFR Am: >60 mL/min (ref >60)
GFR, Estimated: >60 mL/min (ref >60)
Glucose, Bld: 131 mg/dL — ABNORMAL HIGH (ref 70–99)
Potassium: 3.9 mmol/L (ref 3.5–5.1)
Sodium: 140 mmol/L (ref 135–145)
Total Bilirubin: 0.6 mg/dL (ref 0.3–1.2)
Total Protein: 7.5 g/dL (ref 6.5–8.1)

## 2020-05-04 NOTE — Progress Notes (Signed)
  [facility] OFFICE PROGRESS NOTE   Diagnosis: Gastrointestinal stromal tumor  INTERVAL HISTORY:   Kevin [name] returns as scheduled.  He continues [place].  He feels well.  No nausea or vomiting.  No mouth sores.  No diarrhea.  No skin rash.  He denies abdominal pain.  Appetite is stable.  Objective:  Vital signs in last 24 hours:  Blood pressure (!) 169/86, pulse 80, temperature 97.9 F (36.6 C), temperature source Temporal, resp. rate 18, height 5\' 6"  (1.676 m), weight 149 lb 11.2 oz (67.9 kg), SpO2 100 %.    HEENT: No thrush or ulcers. Resp: Lungs clear bilaterally. Cardio: Regular rate and rhythm. GI: Abdomen soft and nontender.  No hepatomegaly.  No mass. Vascular: No leg edema.  Skin: No rash.   Lab Results:  Lab Results  Component Value Date   WBC 3.7 (L) 05/04/2020   HGB 12.0 (L) 05/04/2020   HCT 34.7 (L) 05/04/2020   MCV 92.5 05/04/2020   PLT 229 05/04/2020   NEUTROABS 1.3 (L) 05/04/2020    Imaging:  No results found.  Medications: I have reviewed the patient's current medications.  Assessment/Plan: 1.GISTof the stomach  CT abdomen/pelvis 01/21/2020-large necrotic mass centered in the posterior wall of the stomach, irregular hypodense mass right lobe liver   Upper endoscopy 01/24/2020-large infiltrative mass with no bleeding found in the posterior wall of the stomach.  Biopsy-gastrointestinal stromal tumor   MRI liver 01/28/2020-large exophytic centrally necrotic 17.3 x 12.3 cm gastric mass arising from posterior proximal stomach centered in the left upper quadrant with thick enhancing wall.  Complex multilocular 10.7 x 8.2 cm cystic liver mass right liver lobe.    Neoadjuvant Gleevec 01/28/2020  CT 03/03/2020, resolution of fluid component of right hepatic abscess, unchanged mass posterior to the gastric body, unchanged left periaortic and aortocaval adenopathy 2. Liver lesion in the right lobe -Liver mass consistent with  abscess on MRI of the liver from 01/27/2020 -Status post CT-guided drainage of the liver abscess on 01/28/2020-culture positive for Streptococcus intermedius             -IV antibiotics and discharged home on 10-day course of Augmentin             -CT 03/03/2020-clearance of fluid component of liver abscess, unchanged mass posterior to the gastric body, stable left periaortic and aortocaval adenopathy             -Abscess drain removed 03/09/2020 3. Iron deficiency anemia-oral iron, improved 4. Gastritis and H. pylori infection 5.  History of feversecondary to the liver abscess 6. Protein calorie malnutrition  Disposition: [name] appears stable.  He continues [place] which he is tolerating well.  He will undergo restaging CTs prior to his next visit in 1 month.  We reviewed the CBC from today.  Hemoglobin is better.  He has mild neutropenia.  He will contact the office with fever, chills, other signs of infection.  Plan for repeat CBC in 2 weeks.  He will return for follow-up in 1 month, restaging CTs 1 to 2 days prior.  He will contact the office in the interim as outlined above or with any other problems.    Ned Card ANP/GNP-BC   05/04/2020  2:57 PM

## 2020-05-16 ENCOUNTER — Telehealth: Payer: Self-pay | Admitting: Oncology

## 2020-05-16 NOTE — Telephone Encounter (Signed)
Called and left msg about upcoming appts. Mailed printout  

## 2020-05-18 ENCOUNTER — Inpatient Hospital Stay: Payer: Self-pay | Attending: Nurse Practitioner

## 2020-05-18 DIAGNOSIS — C49A2 Gastrointestinal stromal tumor of stomach: Secondary | ICD-10-CM | POA: Insufficient documentation

## 2020-05-26 ENCOUNTER — Other Ambulatory Visit: Payer: Self-pay | Admitting: *Deleted

## 2020-05-26 MED ORDER — IMATINIB MESYLATE 400 MG PO TABS: 400.0000 mg | ORAL_TABLET | Freq: Every day | ORAL | 0 refills | Status: DC

## 2020-05-29 MED FILL — IMATINIB MESYLATE 400 MG TA: 400 | 30 days supply | Qty: 30 | Fill #0

## 2020-06-01 ENCOUNTER — Ambulatory Visit (HOSPITAL_COMMUNITY)
Admission: RE | Admit: 2020-06-01 | Discharge: 2020-06-01 | Disposition: A | Discharge status: Home / Self Care | Payer: Self-pay | Source: Ambulatory Visit | Attending: Nurse Practitioner | Admitting: Nurse Practitioner

## 2020-06-01 ENCOUNTER — Inpatient Hospital Stay: Payer: Self-pay

## 2020-06-01 ENCOUNTER — Other Ambulatory Visit: Payer: Self-pay

## 2020-06-01 ENCOUNTER — Encounter (HOSPITAL_COMMUNITY): Payer: Self-pay

## 2020-06-01 DIAGNOSIS — C49A2 Gastrointestinal stromal tumor of stomach: Secondary | ICD-10-CM | POA: Insufficient documentation

## 2020-06-01 LAB — CBC WITH DIFFERENTIAL (CANCER CENTER ONLY)
Abs Immature Granulocytes: 0.01 10*3/uL (ref 0.00–0.07)
Basophils Absolute: 0 10*3/uL (ref 0.0–0.1)
Basophils Relative: 0 %
Eosinophils Absolute: 0.1 10*3/uL (ref 0.0–0.5)
Eosinophils Relative: 3 %
HCT: 39 % (ref 39.0–52.0)
Hemoglobin: 13.7 g/dL (ref 13.0–17.0)
Immature Granulocytes: 0 %
Lymphocytes Relative: 41 %
Lymphs Abs: 1.7 10*3/uL (ref 0.7–4.0)
MCH: 32.4 pg (ref 26.0–34.0)
MCHC: 35.1 g/dL (ref 30.0–36.0)
MCV: 92.2 fL (ref 80.0–100.0)
Monocytes Absolute: 0.4 10*3/uL (ref 0.1–1.0)
Monocytes Relative: 9 %
Neutro Abs: 2 10*3/uL (ref 1.7–7.7)
Neutrophils Relative %: 47 %
Platelet Count: 232 10*3/uL (ref 150–400)
RBC: 4.23 MIL/uL (ref 4.22–5.81)
RDW: 12.8 % (ref 11.5–15.5)
WBC Count: 4.2 10*3/uL (ref 4.0–10.5)
nRBC: 0 % (ref 0.0–0.2)

## 2020-06-01 LAB — CMP (CANCER CENTER ONLY)
ALT: 23 U/L (ref 0–44)
AST: 26 U/L (ref 15–41)
Albumin: 3.8 g/dL (ref 3.5–5.0)
Alkaline Phosphatase: 131 U/L — ABNORMAL HIGH (ref 38–126)
Anion gap: 9 (ref 5–15)
BUN: 14 mg/dL (ref 8–23)
CO2: 24 mmol/L (ref 22–32)
Calcium: 8.5 mg/dL — ABNORMAL LOW (ref 8.9–10.3)
Chloride: 105 mmol/L (ref 98–111)
Creatinine: 0.98 mg/dL (ref 0.61–1.24)
GFR, Est AFR Am: >60 mL/min (ref >60)
GFR, Estimated: >60 mL/min (ref >60)
Glucose, Bld: 103 mg/dL — ABNORMAL HIGH (ref 70–99)
Potassium: 3.5 mmol/L (ref 3.5–5.1)
Sodium: 138 mmol/L (ref 135–145)
Total Bilirubin: 0.8 mg/dL (ref 0.3–1.2)
Total Protein: 8.2 g/dL — ABNORMAL HIGH (ref 6.5–8.1)

## 2020-06-01 MED ORDER — SODIUM CHLORIDE (PF) 0.9 % IJ SOLN
INTRAMUSCULAR | Status: AC
  Filled 2020-06-01: qty 50

## 2020-06-01 MED ORDER — IOHEXOL 300 MG/ML  SOLN
100.0000 mL | Freq: Once | INTRAMUSCULAR | Status: AC | PRN
  Administered 2020-06-01: 100 mL via INTRAVENOUS

## 2020-06-02 ENCOUNTER — Inpatient Hospital Stay: Payer: Self-pay | Admitting: Oncology

## 2020-06-02 ENCOUNTER — Telehealth: Payer: Self-pay | Admitting: Oncology

## 2020-06-02 NOTE — Telephone Encounter (Signed)
R/s 6/25 to 6/28 per patient request

## 2020-06-05 ENCOUNTER — Telehealth: Payer: Self-pay | Admitting: Oncology

## 2020-06-05 ENCOUNTER — Other Ambulatory Visit: Payer: Self-pay

## 2020-06-05 ENCOUNTER — Inpatient Hospital Stay (HOSPITAL_BASED_OUTPATIENT_CLINIC_OR_DEPARTMENT_OTHER): Payer: Self-pay | Admitting: Oncology

## 2020-06-05 VITALS — BP 194/97 | HR 80 | Temp 97.5°F | Resp 18 | Ht 66.0 in | Wt 155.8 lb

## 2020-06-05 DIAGNOSIS — C49A2 Gastrointestinal stromal tumor of stomach: Secondary | ICD-10-CM

## 2020-06-05 NOTE — Telephone Encounter (Signed)
Scheduled per los. Gave avs and calendar  

## 2020-06-05 NOTE — Progress Notes (Signed)
[facility] OFFICE PROGRESS NOTE   Diagnosis: Gastrointestinal stromal tumor  INTERVAL HISTORY:   Mr. Broadhead is here today with a Spanish interpreter.  He feels well.  He reports mild nausea when taking Gleevec.  He takes the [place] with food.  No pain.  No rash or diarrhea.  He has leg swelling when he is on his feet.  Objective:  Vital signs in last 24 hours:  Blood pressure (!) 194/97, pulse 80, temperature (!) 97.5 F (36.4 C), temperature source Tympanic, resp. rate 18, height 5\' 6"  (1.676 m), weight 155 lb 12.8 oz (70.7 kg), SpO2 100 %.    HEENT: No periorbital edema Resp: Lungs clear bilaterally Cardio: Regular rate and rhythm GI: No hepatosplenomegaly Vascular: No leg edema  Skin: No rash   Lab Results:  Lab Results  Component Value Date   WBC 4.2 06/01/2020   HGB 13.7 06/01/2020   HCT 39.0 06/01/2020   MCV 92.2 06/01/2020   PLT 232 06/01/2020   NEUTROABS 2.0 06/01/2020    CMP  Lab Results  Component Value Date   NA 138 06/01/2020   K 3.5 06/01/2020   CL 105 06/01/2020   CO2 24 06/01/2020   GLUCOSE 103 (H) 06/01/2020   BUN 14 06/01/2020   CREATININE 0.98 06/01/2020   CALCIUM 8.5 (L) 06/01/2020   PROT 8.2 (H) 06/01/2020   ALBUMIN 3.8 06/01/2020   AST 26 06/01/2020   ALT 23 06/01/2020   ALKPHOS 131 (H) 06/01/2020   BILITOT 0.8 06/01/2020   GFRNONAA >60 06/01/2020   GFRAA >60 06/01/2020    Medications: I have reviewed the patient's current medications.   Assessment/Plan:  1.GISTof the stomach  CT abdomen/pelvis 01/21/2020-large necrotic mass centered in the posterior wall of the stomach, irregular hypodense mass right lobe liver   Upper endoscopy 01/24/2020-large infiltrative mass with no bleeding found in the posterior wall of the stomach.  Biopsy-gastrointestinal stromal tumor   MRI liver 01/28/2020-large exophytic centrally necrotic 17.3 x 12.3 cm gastric mass arising from posterior proximal stomach centered in the left  upper quadrant with thick enhancing wall.  Complex multilocular 10.7 x 8.2 cm cystic liver mass right liver lobe.    Neoadjuvant Gleevec 01/28/2020  CT 03/03/2020, resolution of fluid component of right hepatic abscess, unchanged mass posterior to the gastric body, unchanged left periaortic and aortocaval adenopathy  CT 06/01/2020-decreased size of a multilobulated mass arising from the posterior stomach, unchanged retroperitoneal lymph nodes, removal of liver drain resolution of abscess 2. Liver lesion in the right lobe -Liver mass consistent with abscess on MRI of the liver from 01/27/2020 -Status post CT-guided drainage of the liver abscess on 01/28/2020-culture positive for Streptococcus intermedius             -IV antibiotics and discharged home on 10-day course of Augmentin             -CT 03/03/2020-clearance of fluid component of liver abscess, unchanged mass posterior to the gastric body, stable left periaortic and aortocaval adenopathy             -Abscess drain removed 03/09/2020             -Liver abscess resolved on CT 06/01/2020 3. Iron deficiency anemia-oral iron,  resolved 4. Gastritis and H. pylori infection 5.  History of feversecondary to the liver abscess 6. Protein calorie malnutrition   Disposition: Mr. Hackenberg appears stable.  He is tolerating the [place] well.  The gastric mass is smaller.  Iron deficiency anemia  has resolved.  A hepatic abscess has resolved.  Mr. Lavell has entered a partial clinical remission from the gastrointestinal stromal tumor.  He has gained weight.  I discussed the CT findings and reviewed the images with him.  He will continue Gleevec.  I will present his case at the GI tumor conference to discuss the indication for proceeding with resection of the gastric mass versus continuing Gleevec.  Mr. Humphres will return for an office and lab visit in 1 month.  Betsy Coder, MD  06/05/2020  11:49 AM

## 2020-06-09 MED FILL — MIRTAZAPINE 15 MG TABS: 15 | 30 days supply | Qty: 30 | Fill #2

## 2020-06-14 ENCOUNTER — Other Ambulatory Visit: Payer: Self-pay

## 2020-06-26 MED FILL — MIRTAZAPINE 15 MG TABLET: 15 | 30 days supply | Qty: 30 | Fill #2

## 2020-07-03 ENCOUNTER — Other Ambulatory Visit: Payer: Self-pay

## 2020-07-03 ENCOUNTER — Other Ambulatory Visit: Payer: Self-pay | Admitting: Oncology

## 2020-07-03 NOTE — Progress Notes (Signed)
Medication refill complete for gleevec

## 2020-07-04 ENCOUNTER — Telehealth: Payer: Self-pay | Admitting: *Deleted

## 2020-07-04 MED ORDER — IMATINIB MESYLATE 100 MG PO TABS
400.0000 mg | ORAL_TABLET | Freq: Every day | ORAL | 0 refills | Status: DC
Start: 2020-07-04 — End: 2020-07-26

## 2020-07-04 MED FILL — IMATINIB MESYLATE 100 MG TA: 100 | 30 days supply | Qty: 120 | Fill #0

## 2020-07-04 NOTE — Telephone Encounter (Signed)
Patient has requested to change Gleevec 400 mg tablet to the 100 mg tablets due to size of tablet. He is willing to take #4 tablets/day.

## 2020-07-05 ENCOUNTER — Inpatient Hospital Stay: Payer: Self-pay

## 2020-07-05 ENCOUNTER — Encounter: Payer: Self-pay | Admitting: Nurse Practitioner

## 2020-07-05 ENCOUNTER — Other Ambulatory Visit: Payer: Self-pay

## 2020-07-05 ENCOUNTER — Inpatient Hospital Stay: Payer: Self-pay | Attending: Nurse Practitioner | Admitting: Nurse Practitioner

## 2020-07-05 VITALS — BP 192/97 | HR 88 | Temp 98.4°F | Resp 18 | Ht 66.0 in | Wt 161.4 lb

## 2020-07-05 DIAGNOSIS — C49A2 Gastrointestinal stromal tumor of stomach: Secondary | ICD-10-CM

## 2020-07-05 DIAGNOSIS — R030 Elevated blood-pressure reading, without diagnosis of hypertension: Secondary | ICD-10-CM | POA: Insufficient documentation

## 2020-07-05 LAB — CBC WITH DIFFERENTIAL (CANCER CENTER ONLY)
Abs Immature Granulocytes: 0.02 10*3/uL (ref 0.00–0.07)
Basophils Absolute: 0 10*3/uL (ref 0.0–0.1)
Basophils Relative: 0 %
Eosinophils Absolute: 0.1 10*3/uL (ref 0.0–0.5)
Eosinophils Relative: 2 %
HCT: 35.3 % — ABNORMAL LOW (ref 39.0–52.0)
Hemoglobin: 12.5 g/dL — ABNORMAL LOW (ref 13.0–17.0)
Immature Granulocytes: 1 %
Lymphocytes Relative: 36 %
Lymphs Abs: 1.6 10*3/uL (ref 0.7–4.0)
MCH: 32.6 pg (ref 26.0–34.0)
MCHC: 35.4 g/dL (ref 30.0–36.0)
MCV: 91.9 fL (ref 80.0–100.0)
Monocytes Absolute: 0.5 10*3/uL (ref 0.1–1.0)
Monocytes Relative: 11 %
Neutro Abs: 2.2 10*3/uL (ref 1.7–7.7)
Neutrophils Relative %: 50 %
Platelet Count: 198 10*3/uL (ref 150–400)
RBC: 3.84 MIL/uL — ABNORMAL LOW (ref 4.22–5.81)
RDW: 12.9 % (ref 11.5–15.5)
WBC Count: 4.4 10*3/uL (ref 4.0–10.5)
nRBC: 0 % (ref 0.0–0.2)

## 2020-07-05 LAB — CMP (CANCER CENTER ONLY)
ALT: 17 U/L (ref 0–44)
AST: 22 U/L (ref 15–41)
Albumin: 3.7 g/dL (ref 3.5–5.0)
Alkaline Phosphatase: 116 U/L (ref 38–126)
Anion gap: 9 (ref 5–15)
BUN: 18 mg/dL (ref 8–23)
CO2: 23 mmol/L (ref 22–32)
Calcium: 9.3 mg/dL (ref 8.9–10.3)
Chloride: 106 mmol/L (ref 98–111)
Creatinine: 0.97 mg/dL (ref 0.61–1.24)
GFR, Est AFR Am: >60 mL/min (ref >60)
GFR, Estimated: >60 mL/min (ref >60)
Glucose, Bld: 129 mg/dL — ABNORMAL HIGH (ref 70–99)
Potassium: 3.6 mmol/L (ref 3.5–5.1)
Sodium: 138 mmol/L (ref 135–145)
Total Bilirubin: 0.6 mg/dL (ref 0.3–1.2)
Total Protein: 7.7 g/dL (ref 6.5–8.1)

## 2020-07-05 NOTE — Progress Notes (Signed)
  [facility] OFFICE PROGRESS NOTE   Diagnosis: Gastrointestinal stromal tumor  INTERVAL HISTORY:   Mr. Gilmer returns as scheduled.  A Spanish language interpreter is present.  He continues [place].  He feels well.  He denies nausea/vomiting.  No mouth sores.  No diarrhea.  No rash.  He denies abdominal pain.  No leg swelling.  No shortness of breath.  Objective:  Vital signs in last 24 hours:  Blood pressure (!) 192/97, pulse 88, temperature 98.4 F (36.9 C), temperature source Temporal, resp. rate 18, height 5\' 6"  (1.676 m), weight 161 lb 6.4 oz (73.2 kg), SpO2 98 %.    HEENT: No thrush or ulcers. Resp: Lungs clear bilaterally. Cardio: Regular rate and rhythm. GI: Abdomen soft and nontender.  No hepatomegaly.  No mass. Vascular: No leg edema.  Skin: No rash.   Lab Results:  Lab Results  Component Value Date   WBC 4.4 07/05/2020   HGB 12.5 (L) 07/05/2020   HCT 35.3 (L) 07/05/2020   MCV 91.9 07/05/2020   PLT 198 07/05/2020   NEUTROABS 2.2 07/05/2020    Imaging:  No results found.  Medications: I have reviewed the patient's current medications.  Assessment/Plan: 1.GISTof the stomach  CT abdomen/pelvis 01/21/2020-large necrotic mass centered in the posterior wall of the stomach, irregular hypodense mass right lobe liver   Upper endoscopy 01/24/2020-large infiltrative mass with no bleeding found in the posterior wall of the stomach. Biopsy-gastrointestinal stromal tumor   MRI liver 01/28/2020-large exophytic centrally necrotic 17.3 x 12.3 cm gastric mass arising from posterior proximal stomach centered in the left upper quadrant with thick enhancing wall. Complex multilocular 10.7 x 8.2 cm cystic liver mass right liver lobe.   Neoadjuvant Gleevec 01/28/2020  CT 03/03/2020, resolution of fluid component of right hepatic abscess, unchanged mass posterior to the gastric body, unchanged left periaortic and aortocaval adenopathy  CT 06/01/2020-decreased  size of a multilobulated mass arising from the posterior stomach, unchanged retroperitoneal lymph nodes, removal of liver drain resolution of abscess 2. Liver lesion in the right lobe -Liver mass consistent with abscess on MRI of the liver from 01/27/2020 -Status post CT-guided drainage of the liver abscess on 01/28/2020-culture positive for Streptococcus intermedius -IV antibiotics and discharged home on 10-day course of Augmentin -CT 03/03/2020-clearance of fluid component of liver abscess, unchanged mass posterior to the gastric body, stable left periaortic and aortocaval adenopathy -Abscess drain removed 03/09/2020             -Liver abscess resolved on CT 06/01/2020 3. Iron deficiency anemia-oral iron, resolved 4. Gastritis and H. 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 Protein calorie malnutrition    Disposition: Mr. Ballweg appears well.  He will continue Gleevec.  We reviewed the CBC and chemistry panel from today.  Plan to proceed as above.  His blood pressure has been elevated in our office on multiple occasions.  We made a referral to establish with a primary care provider.  He will return for lab and follow-up in approximately 1 month.    Ned Card ANP/GNP-BC   07/05/2020  2:55 PM

## 2020-07-06 ENCOUNTER — Telehealth: Payer: Self-pay | Admitting: Nurse Practitioner

## 2020-07-06 ENCOUNTER — Telehealth: Payer: Self-pay

## 2020-07-06 NOTE — Telephone Encounter (Signed)
Scheduled appointments per 7/28 los. I used the interpreter line to call patient and left a message on patient's voicemail with appointments date and times.

## 2020-07-06 NOTE — Telephone Encounter (Signed)
Calls placed to both Jaamal Farooqui [phone] and Xayvion Shirah [phone] to try and get PCP info for follow up per np for elevated Bp and general care awaiting calls back

## 2020-07-11 ENCOUNTER — Telehealth: Payer: Self-pay

## 2020-07-11 NOTE — Telephone Encounter (Signed)
Several attempts made to contact family to learn who is primary care provider to follow up for HTN unable to reach anyone at multiple numbers attempts will continue

## 2020-07-16 ENCOUNTER — Encounter (HOSPITAL_COMMUNITY): Payer: Self-pay

## 2020-07-16 ENCOUNTER — Emergency Department (HOSPITAL_COMMUNITY)
Admission: EM | Admit: 2020-07-16 | Discharge: 2020-07-16 | Disposition: A | Discharge status: Home / Self Care | Payer: Self-pay | Attending: Emergency Medicine | Admitting: Emergency Medicine

## 2020-07-16 DIAGNOSIS — Z79899 Other long term (current) drug therapy: Secondary | ICD-10-CM | POA: Insufficient documentation

## 2020-07-16 DIAGNOSIS — F17210 Nicotine dependence, cigarettes, uncomplicated: Secondary | ICD-10-CM | POA: Insufficient documentation

## 2020-07-16 DIAGNOSIS — I10 Essential (primary) hypertension: Secondary | ICD-10-CM | POA: Insufficient documentation

## 2020-07-16 LAB — URINALYSIS, ROUTINE W REFLEX MICROSCOPIC
Bilirubin Urine: NEGATIVE
Glucose, UA: NEGATIVE mg/dL
Ketones, ur: NEGATIVE mg/dL
Leukocytes,Ua: NEGATIVE
Nitrite: NEGATIVE
Protein, ur: NEGATIVE mg/dL
Specific Gravity, Urine: 1.012 (ref 1.005–1.030)
pH: 6 (ref 5.0–8.0)

## 2020-07-16 LAB — BASIC METABOLIC PANEL
Anion gap: 11 (ref 5–15)
BUN: 15 mg/dL (ref 8–23)
CO2: 21 mmol/L — ABNORMAL LOW (ref 22–32)
Calcium: 8.8 mg/dL — ABNORMAL LOW (ref 8.9–10.3)
Chloride: 105 mmol/L (ref 98–111)
Creatinine, Ser: 0.95 mg/dL (ref 0.61–1.24)
GFR calc Af Amer: >60 mL/min (ref >60)
GFR calc non Af Amer: >60 mL/min (ref >60)
Glucose, Bld: 103 mg/dL — ABNORMAL HIGH (ref 70–99)
Potassium: 4.2 mmol/L (ref 3.5–5.1)
Sodium: 137 mmol/L (ref 135–145)

## 2020-07-16 LAB — CBC
HCT: 39.8 % (ref 39.0–52.0)
Hemoglobin: 13.6 g/dL (ref 13.0–17.0)
MCH: 31.8 pg (ref 26.0–34.0)
MCHC: 34.2 g/dL (ref 30.0–36.0)
MCV: 93 fL (ref 80.0–100.0)
Platelets: 246 10*3/uL (ref 150–400)
RBC: 4.28 MIL/uL (ref 4.22–5.81)
RDW: 13.1 % (ref 11.5–15.5)
WBC: 5.5 10*3/uL (ref 4.0–10.5)
nRBC: 0 % (ref 0.0–0.2)

## 2020-07-16 MED ORDER — AMLODIPINE BESYLATE 10 MG PO TABS
10.0000 mg | ORAL_TABLET | Freq: Every day | ORAL | 0 refills | Status: DC
Start: 2020-07-16 — End: 2020-10-30

## 2020-07-16 MED ORDER — AMLODIPINE BESYLATE 5 MG PO TABS
10.0000 mg | ORAL_TABLET | Freq: Once | ORAL | Status: AC
  Administered 2020-07-16: 10 mg via ORAL
  Filled 2020-07-16: qty 2

## 2020-07-16 NOTE — ED Notes (Signed)
Patient verbalizes understanding of discharge instructions. Opportunity for questioning and answers were provided. Armband removed by staff, pt discharged from ED ambulatory.   

## 2020-07-16 NOTE — Discharge Instructions (Signed)
Empiece a tomar amlodipino Conservator, museum/gallery. Este es un medicamento para la presin arterial. Puede comprar un brazalete de presin arterial en lnea o en una farmacia. Recomendara controlar su presin arterial una vez al da aproximadamente a la misma [employer].  Llame a la clnica de salud y bienestar [facility] maana despus de las 9 a. M. Romie Minus programar una cita para la reevaluacin de su presin arterial alta.  Regrese al departamento de emergencias si se presentan signos o sntomas preocupantes, como dolor en el pecho, vmitos, prdida del conocimiento, dificultad para respirar, disminucin de la produccin de orina o fiebre alta.  Start taking amlodipine once daily.  You can purchase a blood pressure cuff online or at a pharmacy.  I would recommend checking your blood pressure once daily around the same time every day.  Please call the [facility] and wellness clinic tomorrow after 9 AM to schedule appointment for reevaluation of your high blood pressure.  Return to the emergency department if any concerning signs or symptoms develop such as chest pain, vomiting, loss of consciousness, shortness of breath, decreased urine output, or high fevers.

## 2020-07-16 NOTE — ED Provider Notes (Signed)
[facility] EMERGENCY DEPARTMENT Provider Note   CSN: [id number] Arrival date & time: 07/16/20  1528     History Chief Complaint  Patient presents with  . Hypertension    Kevin [name] is a 69 y.o. male with history of gastrointestinal stromal tumor, protein calorie malnutrition presents for evaluation of persistently elevated blood pressures.  He states that this was identified at his last oncology visit on 07/05/2020.  There are multiple telephone encounters from the oncologist office noting that they have attempted to reach out to the patient on the phone to follow-up on the elevated blood pressure and to see if he has a PCP.  He tells me he does not have a PCP.  He says he has no history of hypertension.  Today while at [facility] he checked his blood pressure and was noted to be hypertensive with blood pressure 409 systolic.  He was advised to come to the ED for further evaluation and management.  He denies headaches, vision changes, numbness or weakness of the extremities, chest pain, shortness of breath, abdominal pain, nausea, vomiting, or change in urination.  He is a former smoker. He is primarily Spanish-speaking and a formal translator was used throughout the encounter.  The history is provided by the patient. The history is limited by a language barrier. A language interpreter was used.       History reviewed. No pertinent past medical history.  Patient Active Problem List   Diagnosis Date Noted  . Malignant gastrointestinal stromal tumor (GIST) of stomach ([place])   . DNR (do not resuscitate)   . Palliative care by specialist   . Protein-calorie malnutrition, severe 01/31/2020  . Weight loss, unintentional 01/24/2020  . Tobacco abuse 01/24/2020  . Gastric mass 01/22/2020  . Anemia 01/21/2020  . Mass of stomach 01/21/2020    Past Surgical History:  Procedure Laterality Date  . BIOPSY  01/24/2020   Procedure: BIOPSY;  Surgeon: Wonda Horner, MD;  Location:  [facility];  Service: Endoscopy;;  . ESOPHAGOGASTRODUODENOSCOPY (EGD) WITH PROPOFOL N/A 01/24/2020   Procedure: ESOPHAGOGASTRODUODENOSCOPY (EGD) WITH PROPOFOL;  Surgeon: Wonda Horner, MD;  Location: [facility] ENDOSCOPY;  Service: Endoscopy;  Laterality: N/A;  . IR CATHETER TUBE CHANGE  02/18/2020  . IR RADIOLOGIST EVAL & MGMT  02/15/2020  . IR RADIOLOGIST EVAL & MGMT  03/09/2020  . SCLEROTHERAPY  01/24/2020   Procedure: SCLEROTHERAPY;  Surgeon: Wonda Horner, MD;  Location: [facility] ENDOSCOPY;  Service: Endoscopy;;       Family History  Family history unknown: Yes    Social History   Tobacco Use  . Smoking status: Current Every Day Smoker    Types: Cigarettes  . Smokeless tobacco: Never Used  . Tobacco comment: 01/27/2020 - Pt denied smoking cigarettes  Substance Use Topics  . Alcohol use: Not Currently  . Drug use: Not on file    Home Medications Prior to Admission medications   Medication Sig Start Date End Date Taking? Authorizing Provider  amLODipine (NORVASC) 10 MG tablet Take 1 tablet (10 mg total) by mouth daily. 07/16/20   Andi Mahaffy A, PA-C  ferrous sulfate 325 (65 FE) MG tablet Take 1 tablet (325 mg total) by mouth 2 (two) times daily with a meal. 01/28/20   Mercy Riding, MD  imatinib (GLEEVEC) 100 MG tablet Take 4 tablets (400 mg total) by mouth daily. Take with meals and large glass of water.Caution:Chemotherapy 07/04/20   Ladell Pier, MD  mirtazapine (REMERON) 15 MG tablet  Take 1 tablet (15 mg total) by mouth at bedtime. 02/03/20   Mercy Riding, MD  Multiple Vitamins-Minerals (MULTIVITAMIN WITH MINERALS) tablet Take 1 tablet by mouth daily. Patient not taking: Reported on 06/05/2020 01/28/20 07/26/20  Mercy Riding, MD  pantoprazole (PROTONIX) 40 MG tablet Take 1 tablet (40 mg total) by mouth daily. 04/06/20   Ladell Pier, MD  saccharomyces boulardii (FLORASTOR) 250 MG capsule Take 1 capsule (250 mg total) by mouth 2 (two) times daily. 02/03/20   Mercy Riding, MD    senna-docusate (SENOKOT S) 8.6-50 MG tablet Take 1 tablet by mouth daily. 01/28/20   Mercy Riding, MD  tetracycline (SUMYCIN) 250 MG capsule Take 1 capsule (250 mg total) by mouth 4 (four) times daily. 02/13/20   Mercy Riding, MD    Allergies    Patient has no known allergies.  Review of Systems   Review of Systems  Constitutional: Negative for chills and fever.  Respiratory: Negative for shortness of breath.   Cardiovascular: Negative for chest pain.  Gastrointestinal: Negative for abdominal pain, nausea and vomiting.  Genitourinary: Negative for decreased urine volume and difficulty urinating.  Neurological: Negative for weakness, numbness and headaches.  All other systems reviewed and are negative.   Physical Exam Updated Vital Signs BP (!) 217/99   Pulse 72   Temp 98.4 F (36.9 C) (Oral)   Resp 18   SpO2 99%   Physical Exam Vitals and nursing note reviewed.  Constitutional:      General: He is not in acute distress.    Appearance: He is well-developed.     Comments: Resting comfortably in bed, sitting upright  HENT:     Head: Normocephalic and atraumatic.  Eyes:     General:        Right eye: No discharge.        Left eye: No discharge.     Extraocular Movements: Extraocular movements intact.     Conjunctiva/sclera: Conjunctivae normal.     Pupils: Pupils are equal, round, and reactive to light.  Neck:     Vascular: No JVD.     Trachea: No tracheal deviation.  Cardiovascular:     Rate and Rhythm: Normal rate and regular rhythm.  Pulmonary:     Effort: Pulmonary effort is normal.     Breath sounds: Normal breath sounds.  Abdominal:     General: Bowel sounds are normal. There is no distension.     Palpations: Abdomen is soft.     Tenderness: There is no abdominal tenderness. There is no right CVA tenderness, left CVA tenderness, guarding or rebound.  Musculoskeletal:     Cervical back: Neck supple.  Skin:    General: Skin is warm and dry.     Findings: No  erythema.  Neurological:     Mental Status: He is alert.     Comments: Speech is fluent and goal oriented.  No cranial nerve deficit noted.  Moves all extremities spontaneously without difficulty.  Psychiatric:        Behavior: Behavior normal.     ED Results / Procedures / Treatments   Labs (all labs ordered are listed, but only abnormal results are displayed) Labs Reviewed  BASIC METABOLIC PANEL - Abnormal; Notable for the following components:      Result Value   CO2 21 (*)    Glucose, Bld 103 (*)    Calcium 8.8 (*)    All other components within normal limits  URINALYSIS, ROUTINE  W REFLEX MICROSCOPIC - Abnormal; Notable for the following components:   Hgb urine dipstick MODERATE (*)    Bacteria, UA RARE (*)    All other components within normal limits  CBC    EKG EKG Interpretation  Date/Time:  Sunday July 16 2020 19:11:12 EDT Ventricular Rate:  69 PR Interval:  162 QRS Duration: 92 QT Interval:  418 QTC Calculation: 447 R Axis:   68 Text Interpretation: Normal sinus rhythm Left ventricular hypertrophy with repolarization abnormality ( Sokolow-Lyon , Romhilt-Estes ) Abnormal ECG Confirmed by Kohut, Stephen ([id number]) on 07/16/2020 7:30:28 PM   Radiology No results found.  Procedures Procedures (including critical care time)  Medications Ordered in ED Medications  amLODipine (NORVASC) tablet 10 mg (10 mg Oral Given 07/16/20 1912)    ED Course  I have reviewed the triage vital signs and the nursing notes.  Pertinent labs & imaging results that were available during my care of the patient were reviewed by me and considered in my medical decision making (see chart for details).    MDM Rules/Calculators/A&P                          Patient presenting for evaluation of hypertension.  Was noted to be hypertensive at his oncology appointment a couple of days ago.  Since then it appears the oncology clinic has attempted to make contact multiple times to arrange for  follow-up with PCP.  The patient tells me he does not have a PCP.  He is afebrile, hypertensive in the ED.  He is asymptomatic from a hypertension standpoint.  EKG shows no acute ischemic abnormalities but does show some findings consistent with hypertension.  Lab work reviewed and interpreted by myself shows no leukocytosis, no anemia, no metabolic derangements, no renal insufficiency.  UA is not concerning for hypertensive nephropathy at this time.  No chest pain to suggest ACS/MI or dissection.  He was given a dose of amlodipine in the ED with minimal improvement in blood pressure.  We discussed the importance of close follow-up with PCP for reevaluation of symptoms and for medication management.  Case management has been consulted to help arrange follow-up with PCP.  Discuss strict ED return precautions.  Patient verbalized understanding of and agreement with plan and is stable for discharge at this time.  Patient is Spanish-speaking and a formal translator was used throughout the encounter.    Final Clinical Impression(s) / ED Diagnoses Final diagnoses:  Asymptomatic hypertension    Rx / DC Orders ED Discharge Orders         Ordered    amLODipine (NORVASC) 10 MG tablet  Daily     Discontinue  Reprint     08 /08/21 2003           Renita Papa, PA-C 07/18/20 Marcellus Scott, MD 07/24/20 [phone]

## 2020-07-16 NOTE — ED Triage Notes (Signed)
Pt here for high BP.  Went to [facility] today and BP 604 systolic, was told by staff to come to ED.    Pt not on any blood pressure meds.  Review of chart, noticed several notes where the office has been trying to reach patient to set up PCP appointment for blood pressure.   Pt is having no complaints, denies chest pain, shortness of breath, blurred vision, headache.    Requests to be evaluated today.

## 2020-07-18 ENCOUNTER — Telehealth: Payer: Self-pay

## 2020-07-18 NOTE — Telephone Encounter (Signed)
Message received from Laurena Slimmer, RN CM requesting ED follow up appointment for patient. He has no PCP. Calls placed to patient # [phone] and [phone]  With assistance of Spanish interpreter # [phone] with [place].  messages left on both numbers requesting a call back to this CM # [phone].  The patient also has the phone number for [facility] on his AVS.  He may be scheduled at [facility] for follow up as they may be able to see him before he can be seen at [facility].

## 2020-07-24 ENCOUNTER — Telehealth: Payer: Self-pay | Admitting: Surgery

## 2020-07-24 NOTE — Telephone Encounter (Signed)
[facility] Case Manager attempted to reach patient to arrange an ED  follow up appointment.

## 2020-07-26 ENCOUNTER — Other Ambulatory Visit: Payer: Self-pay | Admitting: Oncology

## 2020-07-28 ENCOUNTER — Telehealth: Payer: Self-pay

## 2020-07-28 NOTE — Telephone Encounter (Signed)
Attempted to contact the patient again about scheduling an ED follow up appointment.  He has no PCP   Call placed to # [phone] with assistance of Spanish interpreter # [phone] with [facility] [employer].  Message left with call back requested to this CM # [phone]. Call also placed to # [phone] the interpreter said that the phone did not ring, the message just said that the person is not available.   The patient also has the phone number for [facility] on his AVS.  He may be scheduled at [facility] for follow up as they may be able to see him before he can be seen at [facility].

## 2020-07-31 MED FILL — IMATINIB MESYLATE 100 MG TA: 100 | 30 days supply | Qty: 120 | Fill #0

## 2020-08-03 ENCOUNTER — Other Ambulatory Visit: Payer: Self-pay

## 2020-08-03 ENCOUNTER — Ambulatory Visit: Payer: Self-pay | Admitting: Oncology

## 2020-08-07 ENCOUNTER — Telehealth: Payer: Self-pay | Admitting: Oncology

## 2020-08-07 ENCOUNTER — Other Ambulatory Visit: Payer: Self-pay

## 2020-08-07 ENCOUNTER — Inpatient Hospital Stay (HOSPITAL_BASED_OUTPATIENT_CLINIC_OR_DEPARTMENT_OTHER): Payer: Self-pay | Admitting: Oncology

## 2020-08-07 ENCOUNTER — Inpatient Hospital Stay: Payer: Self-pay | Attending: Nurse Practitioner

## 2020-08-07 VITALS — BP 147/81 | HR 79 | Temp 97.2°F | Resp 18 | Ht 66.0 in | Wt 160.4 lb

## 2020-08-07 DIAGNOSIS — C49A2 Gastrointestinal stromal tumor of stomach: Secondary | ICD-10-CM | POA: Insufficient documentation

## 2020-08-07 DIAGNOSIS — D709 Neutropenia, unspecified: Secondary | ICD-10-CM | POA: Insufficient documentation

## 2020-08-07 LAB — CBC WITH DIFFERENTIAL (CANCER CENTER ONLY)
Abs Immature Granulocytes: 0.01 10*3/uL (ref 0.00–0.07)
Basophils Absolute: 0 10*3/uL (ref 0.0–0.1)
Basophils Relative: 0 %
Eosinophils Absolute: 0.1 10*3/uL (ref 0.0–0.5)
Eosinophils Relative: 3 %
HCT: 36.8 % — ABNORMAL LOW (ref 39.0–52.0)
Hemoglobin: 13.1 g/dL (ref 13.0–17.0)
Immature Granulocytes: 0 %
Lymphocytes Relative: 46 %
Lymphs Abs: 1.6 10*3/uL (ref 0.7–4.0)
MCH: 33.2 pg (ref 26.0–34.0)
MCHC: 35.6 g/dL (ref 30.0–36.0)
MCV: 93.2 fL (ref 80.0–100.0)
Monocytes Absolute: 0.3 10*3/uL (ref 0.1–1.0)
Monocytes Relative: 9 %
Neutro Abs: 1.5 10*3/uL — ABNORMAL LOW (ref 1.7–7.7)
Neutrophils Relative %: 42 %
Platelet Count: 233 10*3/uL (ref 150–400)
RBC: 3.95 MIL/uL — ABNORMAL LOW (ref 4.22–5.81)
RDW: 13 % (ref 11.5–15.5)
WBC Count: 3.5 10*3/uL — ABNORMAL LOW (ref 4.0–10.5)
nRBC: 0 % (ref 0.0–0.2)

## 2020-08-07 LAB — CMP (CANCER CENTER ONLY)
ALT: 16 U/L (ref 0–44)
AST: 21 U/L (ref 15–41)
Albumin: 3.7 g/dL (ref 3.5–5.0)
Alkaline Phosphatase: 113 U/L (ref 38–126)
Anion gap: 5 (ref 5–15)
BUN: 17 mg/dL (ref 8–23)
CO2: 26 mmol/L (ref 22–32)
Calcium: 9.4 mg/dL (ref 8.9–10.3)
Chloride: 107 mmol/L (ref 98–111)
Creatinine: 0.92 mg/dL (ref 0.61–1.24)
GFR, Est AFR Am: >60 mL/min (ref >60)
GFR, Estimated: >60 mL/min (ref >60)
Glucose, Bld: 130 mg/dL — ABNORMAL HIGH (ref 70–99)
Potassium: 3.3 mmol/L — ABNORMAL LOW (ref 3.5–5.1)
Sodium: 138 mmol/L (ref 135–145)
Total Bilirubin: 0.8 mg/dL (ref 0.3–1.2)
Total Protein: 7.8 g/dL (ref 6.5–8.1)

## 2020-08-07 NOTE — Telephone Encounter (Signed)
Scheduled appointments per 8/30 los. Gave patient updated calendar at time of service.  

## 2020-08-07 NOTE — Progress Notes (Signed)
[name] OFFICE PROGRESS NOTE   Diagnosis: Gastrointestinal stromal tumor  INTERVAL HISTORY:   Kevin [name] returns for a scheduled visit.  He is here today with his son and a Spanish interpreter.  He reports feeling well.  No nausea, abdominal pain, or diarrhea.  No bleeding.  He reports swelling at the right knee, improved today.  Objective:  Vital signs in last 24 hours:  Blood pressure (!) 147/81, pulse 79, temperature (!) 97.2 F (36.2 C), temperature source Axillary, resp. rate 18, height 5\' 6"  (1.676 m), weight 160 lb 6.4 oz (72.8 kg), SpO2 99 %.    HEENT: No thrush or ulcers Resp: Lungs clear bilaterally Cardio: Regular rate and rhythm GI: No hepatosplenomegaly, no mass Vascular: No leg edema  Skin: No rash Musculoskeletal: Mild swelling at the right knee joint   Lab Results:  Lab Results  Component Value Date   WBC 3.5 (L) 08/07/2020   HGB 13.1 08/07/2020   HCT 36.8 (L) 08/07/2020   MCV 93.2 08/07/2020   PLT 233 08/07/2020   NEUTROABS 1.5 (L) 08/07/2020    CMP  Lab Results  Component Value Date   NA 138 08/07/2020   K 3.3 (L) 08/07/2020   CL 107 08/07/2020   CO2 26 08/07/2020   GLUCOSE 130 (H) 08/07/2020   BUN 17 08/07/2020   CREATININE 0.92 08/07/2020   CALCIUM 9.4 08/07/2020   PROT 7.8 08/07/2020   ALBUMIN 3.7 08/07/2020   AST 21 08/07/2020   ALT 16 08/07/2020   ALKPHOS 113 08/07/2020   BILITOT 0.8 08/07/2020   GFRNONAA >60 08/07/2020   GFRAA >60 08/07/2020     Medications: I have reviewed the patient's current medications.   Assessment/Plan:  1.GISTof the stomach  CT abdomen/pelvis 01/21/2020-large necrotic mass centered in the posterior wall of the stomach, irregular hypodense mass right lobe liver   Upper endoscopy 01/24/2020-large infiltrative mass with no bleeding found in the posterior wall of the stomach. Biopsy-gastrointestinal stromal tumor   MRI liver 01/28/2020-large exophytic centrally necrotic 17.3 x  12.3 cm gastric mass arising from posterior proximal stomach centered in the left upper quadrant with thick enhancing wall. Complex multilocular 10.7 x 8.2 cm cystic liver mass right liver lobe.   Neoadjuvant Gleevec 01/28/2020  CT 03/03/2020, resolution of fluid component of right hepatic abscess, unchanged mass posterior to the gastric body, unchanged left periaortic and aortocaval adenopathy  CT 06/01/2020-decreased size of a multilobulated mass arising from the posterior stomach, unchanged retroperitoneal lymph nodes, removal of liver drain resolution of abscess 2. Liver lesion in the right lobe -Liver mass consistent with abscess on MRI of the liver from 01/27/2020 -Status post CT-guided drainage of the liver abscess on 01/28/2020-culture positive for Streptococcus intermedius -IV antibiotics and discharged home on 10-day course of Augmentin -CT 03/03/2020-clearance of fluid component of liver abscess, unchanged mass posterior to the gastric body, stable left periaortic and aortocaval adenopathy -Abscess drain removed 03/09/2020             -Liver abscess resolved on CT 06/01/2020 3. Iron deficiency anemia-oral iron, resolved 4. Gastritis and H. 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 Protein calorie malnutrition-resolved     Disposition: Kevin [name] appears unchanged.  He is tolerating the [place] well.  He will be scheduled for a restaging CT prior to an office visit in 1 month.  We will refer him to Dr. Barry Dienes depending on results of the restaging CT.  The mild neutropenia is likely secondary to  Gleevec.  He will call for a fever or symptoms of infection.  Kevin [name] will receive a COVID-19 booster vaccine when he is here next month.  Betsy Coder, MD  08/07/2020  12:18 PM

## 2020-08-11 ENCOUNTER — Telehealth: Payer: Self-pay | Admitting: *Deleted

## 2020-08-11 MED ORDER — POTASSIUM CHLORIDE ER 10 MEQ PO TBCR: 10.0000 meq | EXTENDED_RELEASE_TABLET | Freq: Every day | ORAL | 1 refills | Status: DC

## 2020-08-11 NOTE — Telephone Encounter (Signed)
Notified his wife that K+ was low last week and he needs to begin KCL 10 meq daily. She requests script be sent to [facility] at [facility].  His home # listed was not in service and daughter's # is not in service either.

## 2020-09-01 ENCOUNTER — Ambulatory Visit (HOSPITAL_COMMUNITY): Payer: Self-pay

## 2020-09-01 ENCOUNTER — Inpatient Hospital Stay: Payer: Self-pay | Attending: Nurse Practitioner

## 2020-09-01 ENCOUNTER — Other Ambulatory Visit: Payer: Self-pay

## 2020-09-01 DIAGNOSIS — C49A2 Gastrointestinal stromal tumor of stomach: Secondary | ICD-10-CM | POA: Insufficient documentation

## 2020-09-01 LAB — CBC WITH DIFFERENTIAL (CANCER CENTER ONLY)
Abs Immature Granulocytes: 0.03 10*3/uL (ref 0.00–0.07)
Basophils Absolute: 0 10*3/uL (ref 0.0–0.1)
Basophils Relative: 0 %
Eosinophils Absolute: 0.2 10*3/uL (ref 0.0–0.5)
Eosinophils Relative: 4 %
HCT: 36.8 % — ABNORMAL LOW (ref 39.0–52.0)
Hemoglobin: 13.1 g/dL (ref 13.0–17.0)
Immature Granulocytes: 1 %
Lymphocytes Relative: 47 %
Lymphs Abs: 1.9 10*3/uL (ref 0.7–4.0)
MCH: 33.1 pg (ref 26.0–34.0)
MCHC: 35.6 g/dL (ref 30.0–36.0)
MCV: 92.9 fL (ref 80.0–100.0)
Monocytes Absolute: 0.4 10*3/uL (ref 0.1–1.0)
Monocytes Relative: 10 %
Neutro Abs: 1.5 10*3/uL — ABNORMAL LOW (ref 1.7–7.7)
Neutrophils Relative %: 38 %
Platelet Count: 247 10*3/uL (ref 150–400)
RBC: 3.96 MIL/uL — ABNORMAL LOW (ref 4.22–5.81)
RDW: 12.7 % (ref 11.5–15.5)
WBC Count: 4.1 10*3/uL (ref 4.0–10.5)
nRBC: 0 % (ref 0.0–0.2)

## 2020-09-01 LAB — CMP (CANCER CENTER ONLY)
ALT: 13 U/L (ref 0–44)
AST: 16 U/L (ref 15–41)
Albumin: 3.6 g/dL (ref 3.5–5.0)
Alkaline Phosphatase: 120 U/L (ref 38–126)
Anion gap: 6 (ref 5–15)
BUN: 16 mg/dL (ref 8–23)
CO2: 30 mmol/L (ref 22–32)
Calcium: 9.1 mg/dL (ref 8.9–10.3)
Chloride: 104 mmol/L (ref 98–111)
Creatinine: 0.89 mg/dL (ref 0.61–1.24)
GFR, Est AFR Am: >60 mL/min (ref >60)
GFR, Estimated: >60 mL/min (ref >60)
Glucose, Bld: 126 mg/dL — ABNORMAL HIGH (ref 70–99)
Potassium: 3.5 mmol/L (ref 3.5–5.1)
Sodium: 140 mmol/L (ref 135–145)
Total Bilirubin: 0.7 mg/dL (ref 0.3–1.2)
Total Protein: 7.9 g/dL (ref 6.5–8.1)

## 2020-09-04 ENCOUNTER — Other Ambulatory Visit: Payer: Self-pay | Admitting: Oncology

## 2020-09-04 ENCOUNTER — Inpatient Hospital Stay: Payer: Self-pay | Admitting: Nurse Practitioner

## 2020-09-04 ENCOUNTER — Inpatient Hospital Stay: Payer: Self-pay

## 2020-09-05 ENCOUNTER — Telehealth: Payer: Self-pay | Admitting: *Deleted

## 2020-09-05 MED FILL — IMATINIB MESYLATE 100 MG TA: 100 | 30 days supply | Qty: 120 | Fill #0

## 2020-09-05 NOTE — Telephone Encounter (Signed)
Called wife to reschedule his missed CT scan and OV. She is not able to talk now. Requests nurse call her cell # again and leave voice mail. Confirmed they have the oral contrast at home. Called back and left CT appointment time/date at [facility]. NPO 4 hours prior,except oral contrast at 12:30 and 1:30 pm that day. OV will be on 10/11 at 2:45 pm. Requested return call to confirm.

## 2020-09-14 ENCOUNTER — Ambulatory Visit (HOSPITAL_COMMUNITY)
Admission: RE | Admit: 2020-09-14 | Discharge: 2020-09-14 | Disposition: A | Discharge status: Home / Self Care | Payer: Self-pay | Source: Ambulatory Visit | Attending: Oncology | Admitting: Oncology

## 2020-09-14 ENCOUNTER — Other Ambulatory Visit: Payer: Self-pay

## 2020-09-14 ENCOUNTER — Encounter (HOSPITAL_COMMUNITY): Payer: Self-pay

## 2020-09-14 DIAGNOSIS — C49A2 Gastrointestinal stromal tumor of stomach: Secondary | ICD-10-CM | POA: Insufficient documentation

## 2020-09-14 MED ORDER — IOHEXOL 300 MG/ML  SOLN
100.0000 mL | Freq: Once | INTRAMUSCULAR | Status: AC | PRN
  Administered 2020-09-14: 100 mL via INTRAVENOUS

## 2020-09-18 ENCOUNTER — Inpatient Hospital Stay: Payer: Self-pay | Attending: Nurse Practitioner | Admitting: Nurse Practitioner

## 2020-09-18 ENCOUNTER — Encounter: Payer: Self-pay | Admitting: Nurse Practitioner

## 2020-09-18 ENCOUNTER — Other Ambulatory Visit: Payer: Self-pay

## 2020-09-18 VITALS — BP 170/84 | HR 78 | Temp 97.1°F | Resp 18 | Ht 66.0 in | Wt 162.9 lb

## 2020-09-18 DIAGNOSIS — C49A2 Gastrointestinal stromal tumor of stomach: Secondary | ICD-10-CM | POA: Insufficient documentation

## 2020-09-18 NOTE — Progress Notes (Signed)
[facility] OFFICE PROGRESS NOTE   Diagnosis: Gastrointestinal stromal tumor  INTERVAL HISTORY:   Kevin [name] returns as scheduled.  A Spanish interpreter is present.  He continues [place].  He has occasional mild nausea after taking one of his medications.  He is not sure which medication.  No diarrhea.  No rash.  He denies shortness of breath.  He reports a good appetite.  Objective:  Vital signs in last 24 hours:  Blood pressure (!) 170/84, pulse 78, temperature (!) 97.1 F (36.2 C), temperature source Tympanic, resp. rate 18, height 5\' 6"  (1.676 m), weight 162 lb 14.4 oz (73.9 kg), SpO2 100 %.  Repeat blood pressure improved, 170/84    HEENT: No thrush or ulcers. Resp: Lungs clear bilaterally. Cardio: Regular rate and rhythm. GI: Abdomen soft and nontender.  No hepatomegaly.  No mass. Vascular: No leg edema.  Skin: No rash.   Lab Results:  Lab Results  Component Value Date   WBC 4.1 09/01/2020   HGB 13.1 09/01/2020   HCT 36.8 (L) 09/01/2020   MCV 92.9 09/01/2020   PLT 247 09/01/2020   NEUTROABS 1.5 (L) 09/01/2020    Imaging:  No results found.  Medications: I have reviewed the patient's current medications.  Assessment/Plan: 1.GISTof the stomach  CT abdomen/pelvis 01/21/2020-large necrotic mass centered in the posterior wall of the stomach, irregular hypodense mass right lobe liver   Upper endoscopy 01/24/2020-large infiltrative mass with no bleeding found in the posterior wall of the stomach. Biopsy-gastrointestinal stromal tumor   MRI liver 01/28/2020-large exophytic centrally necrotic 17.3 x 12.3 cm gastric mass arising from posterior proximal stomach centered in the left upper quadrant with thick enhancing wall. Complex multilocular 10.7 x 8.2 cm cystic liver mass right liver lobe.   Neoadjuvant Gleevec 01/28/2020  CT 03/03/2020, resolution of fluid component of right hepatic abscess, unchanged mass posterior to the gastric body,  unchanged left periaortic and aortocaval adenopathy  CT 06/01/2020-decreased size of a multilobulated mass arising from the posterior stomach, unchanged retroperitoneal lymph nodes, removal of liver drain resolution of abscess  CT 09/14/20-no significant change in the large left upper quadrant mass arising from the proximal stomach.  Near complete resolution of right hepatic lobe abscess. 2. Liver lesion in the right lobe -Liver mass consistent with abscess on MRI of the liver from 01/27/2020 -Status post CT-guided drainage of the liver abscess on 01/28/2020-culture positive for Streptococcus intermedius -IV antibiotics and discharged home on 10-day course of Augmentin -CT 03/03/2020-clearance of fluid component of liver abscess, unchanged mass posterior to the gastric body, stable left periaortic and aortocaval adenopathy -Abscess drain removed 03/09/2020 -Liver abscess resolved on CT 06/01/2020 and 09/14/2020 3. Iron deficiency anemia-oral iron,resolved 4. Gastritis and H. 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 Protein calorie malnutrition-resolved  Disposition: Kevin [name] appears unchanged.  Dr. Benay Spice reviewed the CT report/images with him at today's visit.  The gastric mass is stable.  He will continue Gleevec.  We are making a referral to Dr. Barry Dienes.  He will return for lab and follow-up in 6 weeks.  Patient seen with Dr. Benay Spice.    Ned Card ANP/GNP-BC   09/18/2020  4:35 PM This was a shared visit with Ned Card.  Kevin [name] appears well.  The restaging CT reveals no significant change in the gastric mass.  He will continue Gleevec.  We will refer him to Dr. Barry Dienes to consider a partial gastrectomy.  Julieanne Manson, MD

## 2020-09-26 MED FILL — MIRTAZAPINE 15 MG TABS: 15 | 30 days supply | Qty: 30 | Fill #3

## 2020-10-30 ENCOUNTER — Inpatient Hospital Stay (HOSPITAL_BASED_OUTPATIENT_CLINIC_OR_DEPARTMENT_OTHER): Payer: Self-pay | Admitting: Oncology

## 2020-10-30 ENCOUNTER — Other Ambulatory Visit: Payer: Self-pay

## 2020-10-30 ENCOUNTER — Inpatient Hospital Stay: Payer: Self-pay | Attending: Nurse Practitioner

## 2020-10-30 ENCOUNTER — Other Ambulatory Visit: Payer: Self-pay | Admitting: Oncology

## 2020-10-30 VITALS — BP 172/70 | HR 97 | Temp 97.8°F | Resp 17 | Ht 66.0 in | Wt 162.9 lb

## 2020-10-30 DIAGNOSIS — C49A2 Gastrointestinal stromal tumor of stomach: Secondary | ICD-10-CM | POA: Insufficient documentation

## 2020-10-30 DIAGNOSIS — I10 Essential (primary) hypertension: Secondary | ICD-10-CM | POA: Insufficient documentation

## 2020-10-30 LAB — CMP (CANCER CENTER ONLY)
ALT: 11 U/L (ref 0–44)
AST: 16 U/L (ref 15–41)
Albumin: 3.7 g/dL (ref 3.5–5.0)
Alkaline Phosphatase: 120 U/L (ref 38–126)
Anion gap: 8 (ref 5–15)
BUN: 19 mg/dL (ref 8–23)
CO2: 25 mmol/L (ref 22–32)
Calcium: 8.9 mg/dL (ref 8.9–10.3)
Chloride: 105 mmol/L (ref 98–111)
Creatinine: 1 mg/dL (ref 0.61–1.24)
GFR, Estimated: >60 mL/min (ref >60)
Glucose, Bld: 113 mg/dL — ABNORMAL HIGH (ref 70–99)
Potassium: 3.5 mmol/L (ref 3.5–5.1)
Sodium: 138 mmol/L (ref 135–145)
Total Bilirubin: 0.6 mg/dL (ref 0.3–1.2)
Total Protein: 8.1 g/dL (ref 6.5–8.1)

## 2020-10-30 LAB — CBC WITH DIFFERENTIAL (CANCER CENTER ONLY)
Abs Immature Granulocytes: 0.01 10*3/uL (ref 0.00–0.07)
Basophils Absolute: 0 10*3/uL (ref 0.0–0.1)
Basophils Relative: 0 %
Eosinophils Absolute: 0.3 10*3/uL (ref 0.0–0.5)
Eosinophils Relative: 6 %
HCT: 36.6 % — ABNORMAL LOW (ref 39.0–52.0)
Hemoglobin: 12.9 g/dL — ABNORMAL LOW (ref 13.0–17.0)
Immature Granulocytes: 0 %
Lymphocytes Relative: 37 %
Lymphs Abs: 1.8 10*3/uL (ref 0.7–4.0)
MCH: 32 pg (ref 26.0–34.0)
MCHC: 35.2 g/dL (ref 30.0–36.0)
MCV: 90.8 fL (ref 80.0–100.0)
Monocytes Absolute: 0.6 10*3/uL (ref 0.1–1.0)
Monocytes Relative: 12 %
Neutro Abs: 2.2 10*3/uL (ref 1.7–7.7)
Neutrophils Relative %: 45 %
Platelet Count: 246 10*3/uL (ref 150–400)
RBC: 4.03 MIL/uL — ABNORMAL LOW (ref 4.22–5.81)
RDW: 12.5 % (ref 11.5–15.5)
WBC Count: 4.8 10*3/uL (ref 4.0–10.5)
nRBC: 0 % (ref 0.0–0.2)

## 2020-10-30 MED ORDER — IMATINIB MESYLATE 100 MG PO TABS: 400.0000 mg | ORAL_TABLET | Freq: Every day | ORAL | 1 refills | Status: DC

## 2020-10-30 MED ORDER — PROCHLORPERAZINE MALEATE 10 MG PO TABS
10.0000 mg | ORAL_TABLET | Freq: Four times a day (QID) | ORAL | 0 refills | Status: DC | PRN
Start: 2020-10-30 — End: 2020-10-30

## 2020-10-30 MED ORDER — AMLODIPINE BESYLATE 10 MG PO TABS: 10.0000 mg | ORAL_TABLET | Freq: Every day | ORAL | 1 refills | Status: DC

## 2020-10-30 MED ORDER — POTASSIUM CHLORIDE ER 10 MEQ PO TBCR: 10.0000 meq | EXTENDED_RELEASE_TABLET | Freq: Every day | ORAL | 1 refills | Status: DC

## 2020-10-30 MED FILL — POTASSIUM CL ER 10 MEQ TAB: 10 | 30 days supply | Qty: 30 | Fill #0

## 2020-10-30 MED FILL — PROCHLORPERAZINE 10 MG TAB: 10 | 15 days supply | Qty: 60 | Fill #0

## 2020-10-30 MED FILL — AMLODIPINE BESYLATE 10 MG T: 10 | 30 days supply | Qty: 30 | Fill #0

## 2020-10-30 NOTE — Progress Notes (Signed)
[place] OFFICE PROGRESS NOTE   Diagnosis: Gastrointestinal stromal tumor  INTERVAL HISTORY:   Mr. Knauer returns for a scheduled visit.  He is seen today with the aid of a Spanish interpreter.  He reports running out of his medication at least 1 week ago.  He is not taking Gleevec or blood pressure medication.  He feels well.  Good appetite.  No bleeding.  He has nausea lasting for seconds after taking Gleevec.  No emesis. He did not see Dr. Barry Dienes as scheduled on 2020-10-13.  Objective:  Vital signs in last 24 hours:  Blood pressure (!) 172/70, pulse 97, temperature 97.8 F (36.6 C), temperature source Tympanic, resp. rate 17, height 5\' 6"  (1.676 m), weight 162 lb 14.4 oz (73.9 kg), SpO2 97 %.    HEENT: No thrush or ulcers Resp: Lungs with end inspiratory rhonchi at the left posterior base, no respiratory distress Cardio: Regular rate and rhythm GI: No hepatosplenomegaly, no mass, nontender Vascular: No leg edema     Lab Results:  Lab Results  Component Value Date   WBC 4.8 10/30/2020   HGB 12.9 (L) 10/30/2020   HCT 36.6 (L) 10/30/2020   MCV 90.8 10/30/2020   PLT 246 10/30/2020   NEUTROABS 2.2 10/30/2020    CMP  Lab Results  Component Value Date   NA 138 10/30/2020   K 3.5 10/30/2020   CL 105 10/30/2020   CO2 25 10/30/2020   GLUCOSE 113 (H) 10/30/2020   BUN 19 10/30/2020   CREATININE 1.00 10/30/2020   CALCIUM 8.9 10/30/2020   PROT 8.1 10/30/2020   ALBUMIN 3.7 10/30/2020   AST 16 10/30/2020   ALT 11 10/30/2020   ALKPHOS 120 10/30/2020   BILITOT 0.6 10/30/2020   GFRNONAA >60 10/30/2020   GFRAA >60 09/01/2020    No results found for: CEA1  Lab Results  Component Value Date   INR 1.4 (H) 01/28/2020    Imaging:  No results found.  Medications: I have reviewed the patient's current medications.   Assessment/Plan: 1. GISTof the stomach  CT abdomen/pelvis 01/21/2020-large necrotic mass centered in the posterior wall of the  stomach, irregular hypodense mass right lobe liver   Upper endoscopy 01/24/2020-large infiltrative mass with no bleeding found in the posterior wall of the stomach. Biopsy-gastrointestinal stromal tumor   MRI liver 01/28/2020-large exophytic centrally necrotic 17.3 x 12.3 cm gastric mass arising from posterior proximal stomach centered in the left upper quadrant with thick enhancing wall. Complex multilocular 10.7 x 8.2 cm cystic liver mass right liver lobe.   Neoadjuvant Gleevec 01/28/2020  CT 03/03/2020, resolution of fluid component of right hepatic abscess, unchanged mass posterior to the gastric body, unchanged left periaortic and aortocaval adenopathy  CT 06/01/2020-decreased size of a multilobulated mass arising from the posterior stomach, unchanged retroperitoneal lymph nodes, removal of liver drain resolution of abscess  CT 09/14/20-no significant change in the large left upper quadrant mass arising from the proximal stomach.  Near complete resolution of right hepatic lobe abscess. 2. Liver lesion in the right lobe -Liver mass consistent with abscess on MRI of the liver from 01/27/2020 -Status post CT-guided drainage of the liver abscess on 01/28/2020-culture positive for Streptococcus intermedius -IV antibiotics and discharged home on 10-day course of Augmentin -CT 03/03/2020-clearance of fluid component of liver abscess, unchanged mass posterior to the gastric body, stable left periaortic and aortocaval adenopathy -Abscess drain removed 03/09/2020 -Liver abscess resolved on CT 06/01/2020 and 09/14/2020 3. Iron deficiency anemia-oral iron,resolved 4. Gastritis and  H. 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 Protein calorie malnutrition-resolved    Disposition: Kevin [name] appears stable.  He will continue Gleevec for treatment of the gastrointestinal stromal tumor.  There is no  clinical evidence of disease progression.  We stressed the importance of continuing [place].  Arrangements were made for him to obtain [place] and blood pressure medication via the [employer].  We made a referral to her primary provider for management of hypertension.  An appointment is scheduled with Dr. Barry Dienes on  11/06/20.  I stressed the importance of attending this appointment.  Mr. Piscitello will return for an office visit in 6 weeks.  Betsy Coder, MD  10/30/2020  3:50 PM

## 2020-10-30 NOTE — Progress Notes (Signed)
Called daughter and stressed the importance of him getting in with primary care to get his BP under control. Made appointment for him at [facility] for January. Provided appointment date/time/phone # and address. Using interpreter service instructed him to give this to his daughter to keep up with. Refilled his Gleevec, K+ and amlodipine for 2 months per Dr. Benay Spice to have until he gets established w/primary care.

## 2020-10-31 NOTE — Progress Notes (Signed)
Attempted to reach patient's daughter [name] on file and her number is not in service.  This was regarding his upcoming appointment with Dr. Barry Dienes at [facility] for Monday 11/29 to arrive by 2:30 to discuss surgery for GIST.  I had written his appointment down yesterday and reviewed with him using our video interpreter service (in [name]).  So patient is aware.

## 2020-11-06 ENCOUNTER — Other Ambulatory Visit: Payer: Self-pay | Admitting: General Surgery

## 2020-11-06 ENCOUNTER — Encounter: Payer: Self-pay | Admitting: *Deleted

## 2020-11-06 NOTE — Progress Notes (Signed)
Dr. Marcello Moores informed Dr. Benay Spice that he was out of [place]. Refill was sent to [facility] on 10/30/20. Called [facility]: Script on hold due to not being able to reach patient. Provided daughter's phone # to call to arrange delivery.

## 2020-11-07 MED FILL — IMATINIB MESYLATE 100 MG TA: 100 | 30 days supply | Qty: 120 | Fill #0

## 2020-12-04 MED FILL — IMATINIB MESYLATE 100 MG TA: 100 | 30 days supply | Qty: 120 | Fill #1

## 2020-12-11 ENCOUNTER — Inpatient Hospital Stay: Payer: Self-pay | Attending: Nurse Practitioner

## 2020-12-11 ENCOUNTER — Encounter: Payer: Self-pay | Admitting: Nurse Practitioner

## 2020-12-11 ENCOUNTER — Inpatient Hospital Stay (HOSPITAL_BASED_OUTPATIENT_CLINIC_OR_DEPARTMENT_OTHER): Payer: Self-pay | Admitting: Nurse Practitioner

## 2020-12-11 ENCOUNTER — Telehealth: Payer: Self-pay

## 2020-12-11 ENCOUNTER — Other Ambulatory Visit: Payer: Self-pay

## 2020-12-11 ENCOUNTER — Other Ambulatory Visit: Payer: Self-pay | Admitting: Nurse Practitioner

## 2020-12-11 ENCOUNTER — Telehealth: Payer: Self-pay | Admitting: Nurse Practitioner

## 2020-12-11 ENCOUNTER — Inpatient Hospital Stay: Payer: Self-pay

## 2020-12-11 VITALS — BP 175/94 | HR 69 | Temp 98.8°F | Resp 18 | Ht 66.0 in | Wt 161.6 lb

## 2020-12-11 DIAGNOSIS — C49A2 Gastrointestinal stromal tumor of stomach: Secondary | ICD-10-CM | POA: Insufficient documentation

## 2020-12-11 DIAGNOSIS — I10 Essential (primary) hypertension: Secondary | ICD-10-CM | POA: Insufficient documentation

## 2020-12-11 DIAGNOSIS — Z23 Encounter for immunization: Secondary | ICD-10-CM

## 2020-12-11 LAB — CBC WITH DIFFERENTIAL (CANCER CENTER ONLY)
Abs Immature Granulocytes: 0.01 10*3/uL (ref 0.00–0.07)
Basophils Absolute: 0 10*3/uL (ref 0.0–0.1)
Basophils Relative: 0 %
Eosinophils Absolute: 0.1 10*3/uL (ref 0.0–0.5)
Eosinophils Relative: 2 %
HCT: 41.4 % (ref 39.0–52.0)
Hemoglobin: 14.2 g/dL (ref 13.0–17.0)
Immature Granulocytes: 0 %
Lymphocytes Relative: 40 %
Lymphs Abs: 1.8 10*3/uL (ref 0.7–4.0)
MCH: 30.7 pg (ref 26.0–34.0)
MCHC: 34.3 g/dL (ref 30.0–36.0)
MCV: 89.4 fL (ref 80.0–100.0)
Monocytes Absolute: 0.4 10*3/uL (ref 0.1–1.0)
Monocytes Relative: 10 %
Neutro Abs: 2.1 10*3/uL (ref 1.7–7.7)
Neutrophils Relative %: 48 %
Platelet Count: 230 10*3/uL (ref 150–400)
RBC: 4.63 MIL/uL (ref 4.22–5.81)
RDW: 12.3 % (ref 11.5–15.5)
WBC Count: 4.4 10*3/uL (ref 4.0–10.5)
nRBC: 0 % (ref 0.0–0.2)

## 2020-12-11 LAB — CMP (CANCER CENTER ONLY)
ALT: 14 U/L (ref 0–44)
AST: 18 U/L (ref 15–41)
Albumin: 3.7 g/dL (ref 3.5–5.0)
Alkaline Phosphatase: 96 U/L (ref 38–126)
Anion gap: 7 (ref 5–15)
BUN: 14 mg/dL (ref 8–23)
CO2: 24 mmol/L (ref 22–32)
Calcium: 8.4 mg/dL — ABNORMAL LOW (ref 8.9–10.3)
Chloride: 105 mmol/L (ref 98–111)
Creatinine: 1.13 mg/dL (ref 0.61–1.24)
GFR, Estimated: >60 mL/min (ref >60)
Glucose, Bld: 136 mg/dL — ABNORMAL HIGH (ref 70–99)
Potassium: 3.5 mmol/L (ref 3.5–5.1)
Sodium: 136 mmol/L (ref 135–145)
Total Bilirubin: 0.7 mg/dL (ref 0.3–1.2)
Total Protein: 8.4 g/dL — ABNORMAL HIGH (ref 6.5–8.1)

## 2020-12-11 MED ORDER — AMOXICILLIN-POT CLAVULANATE 875-125 MG PO TABS: 1.0000 | ORAL_TABLET | Freq: Two times a day (BID) | ORAL | 0 refills | Status: DC

## 2020-12-11 MED FILL — AMOX-CLAV 875-125 MG TABLET: 875-125 | 14 days supply | Qty: 14 | Fill #0

## 2020-12-11 NOTE — Progress Notes (Signed)
[facility] OFFICE PROGRESS NOTE   Diagnosis: Gastrointestinal stromal tumor  INTERVAL HISTORY:   Kevin [name] returns as scheduled.  He continues Gleevec.  He reports feeling well.  No nausea or vomiting.  No mouth sores.  No diarrhea.  No skin rash.  He denies abdominal pain.  Main complaint today is recent pain involving a fractured tooth.  He saw Dr. Donell Beers 11/06/2020 with the plan for a robotic partial gastrectomy.  He is not aware of a surgery date.  Objective:  Vital signs in last 24 hours:  Blood pressure (!) 175/94, pulse 69, temperature 98.8 F (37.1 C), temperature source Tympanic, resp. rate 18, height 5\' 6"  (1.676 m), weight 161 lb 9.6 oz (73.3 kg), SpO2 99 %.    HEENT: Multiple missing and fractured teeth.  Upper anterior gumline tenderness at site of a fractured tooth.  No drainage noted. Resp: Lungs clear bilaterally. Cardio: Regular rate and rhythm. GI: Abdomen soft and nontender.  No hepatosplenomegaly.  No mass. Vascular: No leg edema.  Skin: No rash.   Lab Results:  Lab Results  Component Value Date   WBC 4.4 12/11/2020   HGB 14.2 12/11/2020   HCT 41.4 12/11/2020   MCV 89.4 12/11/2020   PLT 230 12/11/2020   NEUTROABS 2.1 12/11/2020    Imaging:  No results found.  Medications: I have reviewed the patient's current medications.  Assessment/Plan: 1. GISTof the stomach  CT abdomen/pelvis 01/21/2020-large necrotic mass centered in the posterior wall of the stomach, irregular hypodense mass right lobe liver   Upper endoscopy 01/24/2020-large infiltrative mass with no bleeding found in the posterior wall of the stomach. Biopsy-gastrointestinal stromal tumor   MRI liver 01/28/2020-large exophytic centrally necrotic 17.3 x 12.3 cm gastric mass arising from posterior proximal stomach centered in the left upper quadrant with thick enhancing wall. Complex multilocular 10.7 x 8.2 cm cystic liver mass right liver lobe.   Neoadjuvant  Gleevec 01/28/2020  CT 03/03/2020, resolution of fluid component of right hepatic abscess, unchanged mass posterior to the gastric body, unchanged left periaortic and aortocaval adenopathy  CT 06/01/2020-decreased size of a multilobulated mass arising from the posterior stomach, unchanged retroperitoneal lymph nodes, removal of liver drain resolution of abscess  CT 09/14/20-no significant change in the large left upper quadrant mass arising from the proximal stomach.  Near complete resolution of right hepatic lobe abscess. 2. Liver lesion in the right lobe -Liver mass consistent with abscess on MRI of the liver from 01/27/2020 -Status post CT-guided drainage of the liver abscess on 01/28/2020-culture positive for Streptococcus intermedius -IV antibiotics and discharged home on 10-day course of Augmentin -CT 03/03/2020-clearance of fluid component of liver abscess, unchanged mass posterior to the gastric body, stable left periaortic and aortocaval adenopathy -Abscess drain removed 03/09/2020 -Liver abscess resolved on CT 06/01/2020 and 09/14/2020 3. Iron deficiency anemia-oral iron,resolved 4. Gastritis and H. 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 Protein calorie malnutrition-resolved    Disposition: Kevin [name] appears unchanged.  He is accompanied by his daughter and a Spanish interpreter.  He continues Gleevec.  We contacted Dr. Mordecai Maes office.  They have been trying to reach him to schedule surgery.  We provided his daughter's phone number.  We reviewed the CBC and chemistry panel from today.  Labs adequate to continue Gleevec.  Covid booster given in the office today.  He may have a dental infection.  I prescribed a 7-day course of Augmentin and made a referral to the dentist at [facility].  He will return for lab and follow-up in approximately 6 weeks.  Plan reviewed with Dr.  Benay Spice.    Ned Card ANP/GNP-BC   12/11/2020  3:00 PM

## 2020-12-11 NOTE — Telephone Encounter (Signed)
Spoke with Diane at [facility] as a follow up to his last appointment on 11/06/2020 with Dr. Donell Beers.  He was to be scheduled for surgery.  Per Diane [facility] has attempted to reach patient three times and he has not answered and has no voice mail set up.  I gave them his daughter [name]'s phone number who is [place] speaking for them to reach out to.  The patient and his daughter were made aware of their attempts to reach him to get this scheduled.

## 2020-12-11 NOTE — Telephone Encounter (Signed)
Scheduled appointment per 1/3 los. Spoke to patient who is aware of appointment date and time. Gave patient calendar print out.  

## 2020-12-20 ENCOUNTER — Telehealth: Payer: Self-pay

## 2020-12-20 NOTE — Telephone Encounter (Signed)
Volunteer called patient on behalf of Palliative Care. Patient is doing well at this time.  

## 2021-01-01 ENCOUNTER — Other Ambulatory Visit: Payer: Self-pay | Admitting: Oncology

## 2021-01-01 ENCOUNTER — Other Ambulatory Visit: Payer: Self-pay | Admitting: *Deleted

## 2021-01-01 ENCOUNTER — Ambulatory Visit (INDEPENDENT_AMBULATORY_CARE_PROVIDER_SITE_OTHER): Payer: Self-pay

## 2021-01-01 MED ORDER — IMATINIB MESYLATE 100 MG PO TABS: 400.0000 mg | ORAL_TABLET | Freq: Every day | ORAL | 1 refills | Status: DC

## 2021-01-01 MED FILL — IMATINIB MESYLATE 100 MG TA: 100 | 30 days supply | Qty: 120 | Fill #0

## 2021-01-22 ENCOUNTER — Inpatient Hospital Stay (HOSPITAL_BASED_OUTPATIENT_CLINIC_OR_DEPARTMENT_OTHER): Payer: Self-pay | Admitting: Oncology

## 2021-01-22 ENCOUNTER — Other Ambulatory Visit: Payer: Self-pay

## 2021-01-22 ENCOUNTER — Inpatient Hospital Stay: Payer: Self-pay | Attending: Oncology

## 2021-01-22 VITALS — BP 160/74 | HR 78 | Temp 97.8°F | Resp 18 | Ht 66.0 in | Wt 163.9 lb

## 2021-01-22 DIAGNOSIS — C49A2 Gastrointestinal stromal tumor of stomach: Secondary | ICD-10-CM

## 2021-01-22 LAB — CMP (CANCER CENTER ONLY)
ALT: 16 U/L (ref 0–44)
AST: 19 U/L (ref 15–41)
Albumin: 3.9 g/dL (ref 3.5–5.0)
Alkaline Phosphatase: 107 U/L (ref 38–126)
Anion gap: 6 (ref 5–15)
BUN: 14 mg/dL (ref 8–23)
CO2: 24 mmol/L (ref 22–32)
Calcium: 8.8 mg/dL — ABNORMAL LOW (ref 8.9–10.3)
Chloride: 106 mmol/L (ref 98–111)
Creatinine: 1.03 mg/dL (ref 0.61–1.24)
GFR, Estimated: >60 mL/min (ref >60)
Glucose, Bld: 135 mg/dL — ABNORMAL HIGH (ref 70–99)
Potassium: 3.2 mmol/L — ABNORMAL LOW (ref 3.5–5.1)
Sodium: 136 mmol/L (ref 135–145)
Total Bilirubin: 0.6 mg/dL (ref 0.3–1.2)
Total Protein: 8.1 g/dL (ref 6.5–8.1)

## 2021-01-22 LAB — CBC WITH DIFFERENTIAL (CANCER CENTER ONLY)
Abs Immature Granulocytes: 0.01 10*3/uL (ref 0.00–0.07)
Basophils Absolute: 0 10*3/uL (ref 0.0–0.1)
Basophils Relative: 0 %
Eosinophils Absolute: 0.1 10*3/uL (ref 0.0–0.5)
Eosinophils Relative: 2 %
HCT: 39.1 % (ref 39.0–52.0)
Hemoglobin: 13.5 g/dL (ref 13.0–17.0)
Immature Granulocytes: 0 %
Lymphocytes Relative: 38 %
Lymphs Abs: 1.6 10*3/uL (ref 0.7–4.0)
MCH: 30.5 pg (ref 26.0–34.0)
MCHC: 34.5 g/dL (ref 30.0–36.0)
MCV: 88.3 fL (ref 80.0–100.0)
Monocytes Absolute: 0.4 10*3/uL (ref 0.1–1.0)
Monocytes Relative: 8 %
Neutro Abs: 2.3 10*3/uL (ref 1.7–7.7)
Neutrophils Relative %: 52 %
Platelet Count: 241 10*3/uL (ref 150–400)
RBC: 4.43 MIL/uL (ref 4.22–5.81)
RDW: 13.4 % (ref 11.5–15.5)
WBC Count: 4.3 10*3/uL (ref 4.0–10.5)
nRBC: 0 % (ref 0.0–0.2)

## 2021-01-22 NOTE — Progress Notes (Signed)
[place] OFFICE PROGRESS NOTE   Diagnosis: Gastrointestinal stromal tumor  INTERVAL HISTORY:   Mr. Kovaleski returns for a scheduled visit.  A Spanish interpreter is present for today's visit.  He has no complaint.  No bleeding, nausea, or abdominal pain.  No diarrhea.  He is taking Gleevec and iron.  Objective:  Vital signs in last 24 hours:  Blood pressure (!) 160/74, pulse 78, temperature 97.8 F (36.6 C), temperature source Tympanic, resp. rate 18, height 5\' 6"  (1.676 m), weight 163 lb 14.4 oz (74.3 kg), SpO2 99 %.    HEENT: No thrush or ulcers Resp: Lungs clear bilaterally Cardio: Regular rate and rhythm GI: No hepatosplenomegaly, no mass, nontender Vascular: No leg edema  Skin: No rash   Lab Results:  Lab Results  Component Value Date   WBC 4.3 01/22/2021   HGB 13.5 01/22/2021   HCT 39.1 01/22/2021   MCV 88.3 01/22/2021   PLT 241 01/22/2021   NEUTROABS 2.3 01/22/2021    CMP  Lab Results  Component Value Date   NA 136 01/22/2021   K 3.2 (L) 01/22/2021   CL 106 01/22/2021   CO2 24 01/22/2021   GLUCOSE 135 (H) 01/22/2021   BUN 14 01/22/2021   CREATININE 1.03 01/22/2021   CALCIUM 8.8 (L) 01/22/2021   PROT 8.1 01/22/2021   ALBUMIN 3.9 01/22/2021   AST 19 01/22/2021   ALT 16 01/22/2021   ALKPHOS 107 01/22/2021   BILITOT 0.6 01/22/2021   GFRNONAA >60 01/22/2021   GFRAA >60 09/01/2020     Medications: I have reviewed the patient's current medications.   Assessment/Plan: 1. GISTof the stomach  CT abdomen/pelvis 01/21/2020-large necrotic mass centered in the posterior wall of the stomach, irregular hypodense mass right lobe liver   Upper endoscopy 01/24/2020-large infiltrative mass with no bleeding found in the posterior wall of the stomach. Biopsy-gastrointestinal stromal tumor   MRI liver 01/28/2020-large exophytic centrally necrotic 17.3 x 12.3 cm gastric mass arising from posterior proximal stomach centered in the left upper  quadrant with thick enhancing wall. Complex multilocular 10.7 x 8.2 cm cystic liver mass right liver lobe.   Neoadjuvant Gleevec 01/28/2020  CT 03/03/2020, resolution of fluid component of right hepatic abscess, unchanged mass posterior to the gastric body, unchanged left periaortic and aortocaval adenopathy  CT 06/01/2020-decreased size of a multilobulated mass arising from the posterior stomach, unchanged retroperitoneal lymph nodes, removal of liver drain resolution of abscess  CT 09/14/20-no significant change in the large left upper quadrant mass arising from the proximal stomach.  Near complete resolution of right hepatic lobe abscess. 2. Liver lesion in the right lobe -Liver mass consistent with abscess on MRI of the liver from 01/27/2020 -Status post CT-guided drainage of the liver abscess on 01/28/2020-culture positive for Streptococcus intermedius -IV antibiotics and discharged home on 10-day course of Augmentin -CT 03/03/2020-clearance of fluid component of liver abscess, unchanged mass posterior to the gastric body, stable left periaortic and aortocaval adenopathy -Abscess drain removed 03/09/2020 -Liver abscess resolved on CT 06/01/2020 and 09/14/2020 3. Iron deficiency anemia-oral iron,resolved 4. Gastritis and H. 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 Protein calorie malnutrition-resolved    Disposition: Kevin [name] appears stable.  He is tolerating the [place] well.  He understands the only potentially curative therapy for the gastrointestinal stromal tumor is surgery.  He does not wish to undergo surgery at present.  He says he needs more time to "think about it ".  I explained the likelihood  of developing resistance to Gleevec at some point.  He will continue Gleevec.  Mr. Renfroe will return for an office and lab visit in 6 weeks.  Betsy Coder, MD  01/22/2021  1:10  PM

## 2021-01-23 ENCOUNTER — Other Ambulatory Visit: Payer: Self-pay | Admitting: *Deleted

## 2021-01-23 ENCOUNTER — Other Ambulatory Visit: Payer: Self-pay | Admitting: Oncology

## 2021-01-23 MED ORDER — POTASSIUM CHLORIDE ER 10 MEQ PO TBCR: 10.0000 meq | EXTENDED_RELEASE_TABLET | Freq: Every day | ORAL | 3 refills | Status: DC

## 2021-01-23 MED FILL — POTASSIUM CL ER 10 MEQ TAB: 10 | 30 days supply | Qty: 30 | Fill #0

## 2021-01-23 NOTE — Progress Notes (Signed)
Spoke w/daughter, Orlean Bradford and informed her that script for his K+ has been sent to pharmacy and needs to get started on this. Also confirmed that he does not have PCP--CSW note of 07/28/20 reports trying to get him established with [facility]. Daughter states OK to call her with the appointment.

## 2021-01-26 MED FILL — IMATINIB MESYLATE 100 MG TA: 100 | 30 days supply | Qty: 120 | Fill #1

## 2021-01-31 ENCOUNTER — Telehealth: Payer: Self-pay | Admitting: *Deleted

## 2021-01-31 NOTE — Telephone Encounter (Signed)
Spoke with representative at [facility] regarding getting Kevin [name] in to establish primary care. Was told all he has to do is call [phone] to make an appointment. They are located at [address] in [place], [place] [address]. Nearest landmark is [address].

## 2021-03-01 ENCOUNTER — Other Ambulatory Visit: Payer: Self-pay | Admitting: Oncology

## 2021-03-07 ENCOUNTER — Telehealth: Payer: Self-pay | Admitting: *Deleted

## 2021-03-07 ENCOUNTER — Other Ambulatory Visit: Payer: Self-pay

## 2021-03-07 ENCOUNTER — Telehealth: Payer: Self-pay | Admitting: Nurse Practitioner

## 2021-03-07 ENCOUNTER — Encounter: Payer: Self-pay | Admitting: Nurse Practitioner

## 2021-03-07 ENCOUNTER — Inpatient Hospital Stay (HOSPITAL_BASED_OUTPATIENT_CLINIC_OR_DEPARTMENT_OTHER): Payer: Self-pay | Admitting: Nurse Practitioner

## 2021-03-07 ENCOUNTER — Inpatient Hospital Stay: Payer: Self-pay | Attending: Nurse Practitioner

## 2021-03-07 VITALS — BP 195/97 | HR 69 | Temp 97.8°F | Resp 18 | Ht 66.0 in | Wt 162.7 lb

## 2021-03-07 DIAGNOSIS — C49A2 Gastrointestinal stromal tumor of stomach: Secondary | ICD-10-CM | POA: Insufficient documentation

## 2021-03-07 LAB — CMP (CANCER CENTER ONLY)
ALT: 12 U/L (ref 0–44)
AST: 16 U/L (ref 15–41)
Albumin: 3.8 g/dL (ref 3.5–5.0)
Alkaline Phosphatase: 108 U/L (ref 38–126)
Anion gap: 11 (ref 5–15)
BUN: 12 mg/dL (ref 8–23)
CO2: 23 mmol/L (ref 22–32)
Calcium: 8.5 mg/dL — ABNORMAL LOW (ref 8.9–10.3)
Chloride: 104 mmol/L (ref 98–111)
Creatinine: 0.97 mg/dL (ref 0.61–1.24)
GFR, Estimated: >60 mL/min (ref >60)
Glucose, Bld: 123 mg/dL — ABNORMAL HIGH (ref 70–99)
Potassium: 3.8 mmol/L (ref 3.5–5.1)
Sodium: 138 mmol/L (ref 135–145)
Total Bilirubin: 0.6 mg/dL (ref 0.3–1.2)
Total Protein: 7.9 g/dL (ref 6.5–8.1)

## 2021-03-07 LAB — CBC WITH DIFFERENTIAL (CANCER CENTER ONLY)
Abs Immature Granulocytes: 0.01 10*3/uL (ref 0.00–0.07)
Basophils Absolute: 0 10*3/uL (ref 0.0–0.1)
Basophils Relative: 0 %
Eosinophils Absolute: 0.1 10*3/uL (ref 0.0–0.5)
Eosinophils Relative: 2 %
HCT: 37.9 % — ABNORMAL LOW (ref 39.0–52.0)
Hemoglobin: 13.3 g/dL (ref 13.0–17.0)
Immature Granulocytes: 0 %
Lymphocytes Relative: 37 %
Lymphs Abs: 1.6 10*3/uL (ref 0.7–4.0)
MCH: 30.9 pg (ref 26.0–34.0)
MCHC: 35.1 g/dL (ref 30.0–36.0)
MCV: 88.1 fL (ref 80.0–100.0)
Monocytes Absolute: 0.3 10*3/uL (ref 0.1–1.0)
Monocytes Relative: 8 %
Neutro Abs: 2.2 10*3/uL (ref 1.7–7.7)
Neutrophils Relative %: 53 %
Platelet Count: 247 10*3/uL (ref 150–400)
RBC: 4.3 MIL/uL (ref 4.22–5.81)
RDW: 14 % (ref 11.5–15.5)
WBC Count: 4.2 10*3/uL (ref 4.0–10.5)
nRBC: 0 % (ref 0.0–0.2)

## 2021-03-07 NOTE — Progress Notes (Signed)
  [facility] OFFICE PROGRESS NOTE   Diagnosis: Gastrointestinal stromal tumor  INTERVAL HISTORY:   [name] returns as scheduled.  A Spanish interpreter was present for today's visit.  He continues [place].  He feels well.  He has no complaints.  He denies nausea/vomiting.  No diarrhea.  No rash.  He has a good appetite.  He is exercising.  Objective:  Vital signs in last 24 hours:  Blood pressure (!) 195/97, pulse 69, temperature 97.8 F (36.6 C), temperature source Tympanic, resp. rate 18, height 5\' 6"  (1.676 m), weight 162 lb 11.2 oz (73.8 kg), SpO2 96 %.    HEENT: No thrush or ulcers. Resp: Lungs clear bilaterally. Cardio: Regular rate and rhythm. GI: Abdomen soft and nontender.  No hepatomegaly.  No mass. Vascular: No leg edema. Skin: No rash.   Lab Results:  Lab Results  Component Value Date   WBC 4.2 03/07/2021   HGB 13.3 03/07/2021   HCT 37.9 (L) 03/07/2021   MCV 88.1 03/07/2021   PLT 247 03/07/2021   NEUTROABS 2.2 03/07/2021    Imaging:  No results found.  Medications: I have reviewed the patient's current medications.  Assessment/Plan: 1.GISTof the stomach  CT abdomen/pelvis 01/21/2020-large necrotic mass centered in the posterior wall of the stomach, irregular hypodense mass right lobe liver   Upper endoscopy 01/24/2020-large infiltrative mass with no bleeding found in the posterior wall of the stomach. Biopsy-gastrointestinal stromal tumor   MRI liver 01/28/2020-large exophytic centrally necrotic 17.3 x 12.3 cm gastric mass arising from posterior proximal stomach centered in the left upper quadrant with thick enhancing wall. Complex multilocular 10.7 x 8.2 cm cystic liver mass right liver lobe.   Neoadjuvant Gleevec 01/28/2020  CT 03/03/2020, resolution of fluid component of right hepatic abscess, unchanged mass posterior to the gastric body, unchanged left periaortic and aortocaval adenopathy  CT 06/01/2020-decreased size of a  multilobulated mass arising from the posterior stomach, unchanged retroperitoneal lymph nodes, removal of liver drain resolution of abscess  CT 09/14/20-no significant change in the large left upper quadrant mass arising from the proximal stomach. Near complete resolution of right hepatic lobe abscess. 2. Liver lesion in the right lobe -Liver mass consistent with abscess on MRI of the liver from 01/27/2020 -Status post CT-guided drainage of the liver abscess on 01/28/2020-culture positive for Streptococcus intermedius -IV antibiotics and discharged home on 10-day course of Augmentin -CT 03/03/2020-clearance of fluid component of liver abscess, unchanged mass posterior to the gastric body, stable left periaortic and aortocaval adenopathy -Abscess drain removed 03/09/2020 -Liver abscess resolved on CT 06/01/2020 and 09/14/2020 3. Iron deficiency anemia-oral iron,resolved 4. Gastritis and H. 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 Protein calorie malnutrition-resolved    Disposition: Kevin [name] appears stable. There is no clinical evidence of disease progression.  He continues [place] and seems to be tolerating well.  We again discussed surgery for the gastrointestinal stromal tumor.  He states that he does not wish to proceed with surgery at this time.  We again discussed the likelihood of developing resistance to Gleevec at some point.  We reviewed the CBC from today.  Counts adequate to continue Gleevec.  He will return for lab and office visit in 6 weeks.    Ned Card ANP/GNP-BC   03/07/2021  1:46 PM

## 2021-03-07 NOTE — Telephone Encounter (Signed)
Patients daughter Angle Karel called.  Patient had appt in office today with Ned Card, NP and she was not able to get a report.  She is requesting a call back with update on her father.  Routed to Ned Card, NP

## 2021-03-07 NOTE — Telephone Encounter (Signed)
Called and patient transferred the phone to his family member.  I gave the family member the appointment date & time.  Patient does not speak English per 3/30 los

## 2021-03-08 ENCOUNTER — Other Ambulatory Visit (HOSPITAL_COMMUNITY): Payer: Self-pay

## 2021-03-08 ENCOUNTER — Telehealth: Payer: Self-pay | Admitting: Nurse Practitioner

## 2021-03-08 NOTE — Telephone Encounter (Signed)
Attempted to return call to [facility].  There was no answer, mailbox full, unable to leave a message.

## 2021-03-10 ENCOUNTER — Other Ambulatory Visit (HOSPITAL_COMMUNITY): Payer: Self-pay

## 2021-03-10 MED FILL — Imatinib Mesylate Tab 100 MG (Base Equivalent): ORAL | 30 days supply | Qty: 120 | Fill #0 | Status: AC

## 2021-03-10 MED FILL — Potassium Chloride Tab ER 10 mEq: ORAL | 30 days supply | Qty: 30 | Fill #0 | Status: AC

## 2021-03-11 ENCOUNTER — Other Ambulatory Visit (HOSPITAL_COMMUNITY): Payer: Self-pay

## 2021-03-12 ENCOUNTER — Other Ambulatory Visit (HOSPITAL_COMMUNITY): Payer: Self-pay

## 2021-03-13 ENCOUNTER — Other Ambulatory Visit (HOSPITAL_COMMUNITY): Payer: Self-pay

## 2021-03-14 ENCOUNTER — Other Ambulatory Visit (HOSPITAL_COMMUNITY): Payer: Self-pay

## 2021-03-29 ENCOUNTER — Other Ambulatory Visit (HOSPITAL_COMMUNITY): Payer: Self-pay

## 2021-04-11 ENCOUNTER — Other Ambulatory Visit (HOSPITAL_COMMUNITY): Payer: Self-pay

## 2021-04-11 MED FILL — Imatinib Mesylate Tab 100 MG (Base Equivalent): ORAL | 30 days supply | Qty: 120 | Fill #1 | Status: CN

## 2021-04-12 ENCOUNTER — Other Ambulatory Visit (HOSPITAL_COMMUNITY): Payer: Self-pay

## 2021-04-19 ENCOUNTER — Inpatient Hospital Stay: Payer: Self-pay | Admitting: Oncology

## 2021-04-19 ENCOUNTER — Inpatient Hospital Stay: Payer: Self-pay

## 2021-04-23 ENCOUNTER — Other Ambulatory Visit: Payer: Self-pay

## 2021-04-23 ENCOUNTER — Inpatient Hospital Stay: Payer: Self-pay | Attending: Oncology

## 2021-04-23 ENCOUNTER — Inpatient Hospital Stay (HOSPITAL_BASED_OUTPATIENT_CLINIC_OR_DEPARTMENT_OTHER): Payer: Self-pay | Admitting: Oncology

## 2021-04-23 ENCOUNTER — Encounter: Payer: Self-pay | Admitting: Oncology

## 2021-04-23 VITALS — BP 172/92 | HR 76 | Temp 98.7°F | Resp 18 | Ht 66.0 in | Wt 157.6 lb

## 2021-04-23 DIAGNOSIS — C49A2 Gastrointestinal stromal tumor of stomach: Secondary | ICD-10-CM | POA: Insufficient documentation

## 2021-04-23 LAB — CBC WITH DIFFERENTIAL (CANCER CENTER ONLY)
Abs Immature Granulocytes: 0.01 10*3/uL (ref 0.00–0.07)
Basophils Absolute: 0 10*3/uL (ref 0.0–0.1)
Basophils Relative: 0 %
Eosinophils Absolute: 0.1 10*3/uL (ref 0.0–0.5)
Eosinophils Relative: 2 %
HCT: 37.7 % — ABNORMAL LOW (ref 39.0–52.0)
Hemoglobin: 12.9 g/dL — ABNORMAL LOW (ref 13.0–17.0)
Immature Granulocytes: 0 %
Lymphocytes Relative: 33 %
Lymphs Abs: 1.4 10*3/uL (ref 0.7–4.0)
MCH: 30.7 pg (ref 26.0–34.0)
MCHC: 34.2 g/dL (ref 30.0–36.0)
MCV: 89.8 fL (ref 80.0–100.0)
Monocytes Absolute: 0.3 10*3/uL (ref 0.1–1.0)
Monocytes Relative: 8 %
Neutro Abs: 2.4 10*3/uL (ref 1.7–7.7)
Neutrophils Relative %: 57 %
Platelet Count: 250 10*3/uL (ref 150–400)
RBC: 4.2 MIL/uL — ABNORMAL LOW (ref 4.22–5.81)
RDW: 13 % (ref 11.5–15.5)
WBC Count: 4.1 10*3/uL (ref 4.0–10.5)
nRBC: 0 % (ref 0.0–0.2)

## 2021-04-23 LAB — CMP (CANCER CENTER ONLY)
ALT: 10 U/L (ref 0–44)
AST: 16 U/L (ref 15–41)
Albumin: 4.1 g/dL (ref 3.5–5.0)
Alkaline Phosphatase: 82 U/L (ref 38–126)
Anion gap: 9 (ref 5–15)
BUN: 13 mg/dL (ref 8–23)
CO2: 26 mmol/L (ref 22–32)
Calcium: 8.8 mg/dL — ABNORMAL LOW (ref 8.9–10.3)
Chloride: 103 mmol/L (ref 98–111)
Creatinine: 0.87 mg/dL (ref 0.61–1.24)
GFR, Estimated: >60 mL/min (ref >60)
Glucose, Bld: 111 mg/dL — ABNORMAL HIGH (ref 70–99)
Potassium: 3.4 mmol/L — ABNORMAL LOW (ref 3.5–5.1)
Sodium: 138 mmol/L (ref 135–145)
Total Bilirubin: 0.6 mg/dL (ref 0.3–1.2)
Total Protein: 7.4 g/dL (ref 6.5–8.1)

## 2021-04-23 NOTE — Progress Notes (Signed)
Provided patient the phone # and address of [facility] again today. They will accept him as a patient. He just needs to call and make the appointment. Interpreter translated for nurse.

## 2021-04-23 NOTE — Progress Notes (Signed)
[facility] OFFICE PROGRESS NOTE   Diagnosis: Gastrointestinal stromal tumor  INTERVAL HISTORY:   Kevin [name] returns for a scheduled visit.  He is seen today with the aid of a Spanish interpreter.  He denies rash, diarrhea, nausea, abdominal pain, and bleeding.  He is taking Gleevec.  He has not been taking his blood pressure medication.  He continues iron and potassium.  Objective:  Vital signs in last 24 hours:  Blood pressure (!) 172/92, pulse 76, temperature 98.7 F (37.1 C), temperature source Oral, resp. rate 18, height 5\' 6"  (1.676 m), weight 157 lb 9.6 oz (71.5 kg), SpO2 97 %.    HEENT: No thrush or ulcers Resp: Lungs clear bilaterally Cardio: Regular rate and rhythm GI: No mass, no hepatosplenomegaly, nontender Vascular: No leg edema  Skin: No rash   Lab Results:  Lab Results  Component Value Date   WBC 4.1 04/23/2021   HGB 12.9 (L) 04/23/2021   HCT 37.7 (L) 04/23/2021   MCV 89.8 04/23/2021   PLT 250 04/23/2021   NEUTROABS 2.4 04/23/2021    CMP  Lab Results  Component Value Date   NA 138 04/23/2021   K 3.4 (L) 04/23/2021   CL 103 04/23/2021   CO2 26 04/23/2021   GLUCOSE 111 (H) 04/23/2021   BUN 13 04/23/2021   CREATININE 0.87 04/23/2021   CALCIUM 8.8 (L) 04/23/2021   PROT 7.4 04/23/2021   ALBUMIN 4.1 04/23/2021   AST 16 04/23/2021   ALT 10 04/23/2021   ALKPHOS 82 04/23/2021   BILITOT 0.6 04/23/2021   GFRNONAA >60 04/23/2021   GFRAA >60 09/01/2020     Medications: I have reviewed the patient's current medications.   Assessment/Plan: 1. GISTof the stomach  CT abdomen/pelvis 01/21/2020-large necrotic mass centered in the posterior wall of the stomach, irregular hypodense mass right lobe liver   Upper endoscopy 01/24/2020-large infiltrative mass with no bleeding found in the posterior wall of the stomach. Biopsy-gastrointestinal stromal tumor   MRI liver 01/28/2020-large exophytic centrally necrotic 17.3 x 12.3 cm gastric mass  arising from posterior proximal stomach centered in the left upper quadrant with thick enhancing wall. Complex multilocular 10.7 x 8.2 cm cystic liver mass right liver lobe.   Neoadjuvant Gleevec 01/28/2020  CT 03/03/2020, resolution of fluid component of right hepatic abscess, unchanged mass posterior to the gastric body, unchanged left periaortic and aortocaval adenopathy  CT 06/01/2020-decreased size of a multilobulated mass arising from the posterior stomach, unchanged retroperitoneal lymph nodes, removal of liver drain resolution of abscess  CT 09/14/20-no significant change in the large left upper quadrant mass arising from the proximal stomach. Near complete resolution of right hepatic lobe abscess. 2. Liver lesion in the right lobe -Liver mass consistent with abscess on MRI of the liver from 01/27/2020 -Status post CT-guided drainage of the liver abscess on 01/28/2020-culture positive for Streptococcus intermedius -IV antibiotics and discharged home on 10-day course of Augmentin -CT 03/03/2020-clearance of fluid component of liver abscess, unchanged mass posterior to the gastric body, stable left periaortic and aortocaval adenopathy -Abscess drain removed 03/09/2020 -Liver abscess resolved on CT 06/01/2020 and 09/14/2020 3. Iron deficiency anemia-oral iron,resolved 4. Gastritis and H. 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 Protein calorie malnutrition-resolved      Disposition: Mr. Genter appears unchanged.  No clinical evidence for progression of the gastrointestinal stromal tumor.  We discussed our recommendation to proceed with resection of the gastric tumor.  He does not wish to have surgery.  He  will continue [place].  He will be scheduled for a restaging CT and office visit in approximately 6 weeks.  We reviewed his medication list and urged him to continue amlodipine,  potassium, Protonix, iron, and imatinib.  He does not wish to receive a fourth COVID-vaccine.  Betsy Coder, MD  04/23/2021  12:17 PM

## 2021-05-01 ENCOUNTER — Ambulatory Visit (HOSPITAL_BASED_OUTPATIENT_CLINIC_OR_DEPARTMENT_OTHER): Admission: RE | Admit: 2021-05-01 | Payer: Self-pay | Source: Ambulatory Visit

## 2021-05-09 ENCOUNTER — Other Ambulatory Visit (HOSPITAL_COMMUNITY): Payer: Self-pay

## 2021-05-11 ENCOUNTER — Other Ambulatory Visit (HOSPITAL_COMMUNITY): Payer: Self-pay

## 2021-05-11 ENCOUNTER — Encounter: Payer: Self-pay | Admitting: Internal Medicine

## 2021-05-11 ENCOUNTER — Ambulatory Visit: Payer: Self-pay | Attending: Internal Medicine | Admitting: Internal Medicine

## 2021-05-11 ENCOUNTER — Other Ambulatory Visit: Payer: Self-pay

## 2021-05-11 VITALS — BP 208/106 | HR 73 | Resp 16 | Ht 61.0 in | Wt 158.6 lb

## 2021-05-11 DIAGNOSIS — Z87891 Personal history of nicotine dependence: Secondary | ICD-10-CM | POA: Insufficient documentation

## 2021-05-11 DIAGNOSIS — C49A2 Gastrointestinal stromal tumor of stomach: Secondary | ICD-10-CM | POA: Insufficient documentation

## 2021-05-11 DIAGNOSIS — I10 Essential (primary) hypertension: Secondary | ICD-10-CM | POA: Insufficient documentation

## 2021-05-11 DIAGNOSIS — Z79899 Other long term (current) drug therapy: Secondary | ICD-10-CM | POA: Insufficient documentation

## 2021-05-11 DIAGNOSIS — Z7689 Persons encountering health services in other specified circumstances: Secondary | ICD-10-CM

## 2021-05-11 DIAGNOSIS — E876 Hypokalemia: Secondary | ICD-10-CM | POA: Insufficient documentation

## 2021-05-11 MED ORDER — LISINOPRIL 10 MG PO TABS
10.0000 mg | ORAL_TABLET | Freq: Every day | ORAL | 3 refills | Status: DC
  Filled 2021-05-11: qty 30, 30d supply, fill #0

## 2021-05-11 NOTE — Progress Notes (Signed)
Patient ID: Kevin [name], male    DOB: Aug 14, 1951  MRN: [id number]  CC: New Patient (Initial Visit)   Subjective: Kevin [name] is a 70 y.o. male who presents for new pt visit.  Wife, Loura Pardon is with him. Joaquim Lai from [facility] interprets.  His concerns today include:  Patient with malignant GIST, HTN, former smoker quit in 1998, [place],  [place] came to est care with [place] today.  No previous PCP.  Pt current receiving treatment for malignant GIST Currently being treated by Dr. Benay Spice.  On Gleevec.  He apparently declined surgery.   HYPERTENSION Currently taking: see medication list.  On Norvasc which he takes at nights.  I note that he has been having hypokalemia and is on potassium supplement. Med Adherence: [x]  Yes    []  No Medication side effects: []  Yes    [x]  No Adherence with salt restriction: [x]  Yes    []  No Home Monitoring?: []  Yes    [x]  No Monitoring Frequency: []  Yes    [x]  No - no device Home BP results range: []  Yes    []  No SOB? []  Yes    [x]  No Chest Pain?: []  Yes    [x]  No Leg swelling?: []  Yes    [x]  No Headaches?: []  Yes    [x]  No Dizziness? 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 Patient Active Problem List   Diagnosis Date Noted  . Malignant gastrointestinal stromal tumor (GIST) of stomach ([place])   . DNR (do not resuscitate)   . Palliative care by specialist   . Protein-calorie malnutrition, severe 01/31/2020  . Weight loss, unintentional 01/24/2020  . Tobacco abuse 01/24/2020  . Gastric mass 01/22/2020  . Anemia 01/21/2020  . Mass of stomach 01/21/2020     Current Outpatient Medications on File Prior to Visit  Medication Sig Dispense Refill  . amLODipine (NORVASC) 10 MG tablet TAKE 1 TABLET BY MOUTH DAILY. 30 tablet 1  . ferrous sulfate 325 (65 FE) MG tablet Take 1 tablet (325 mg total) by mouth 2 (two) times daily  with a meal. 180 tablet 1  . imatinib (GLEEVEC) 100 MG tablet TAKE 4 TABLETS (400 MG) BY MOUTH ONCE DAILY. TAKE WITH MEALS AND A FULL GLASS OF WATER. CAUTION: CHEMOTHERAPY 120 tablet 1  . pantoprazole (PROTONIX) 40 MG tablet Take 1 tablet (40 mg total) by mouth daily. (Patient not taking: No sig reported) 30 tablet 2  . potassium chloride (KLOR-CON) 10 MEQ tablet TAKE 1 TABLET BY MOUTH DAILY. 30 tablet 3  . prochlorperazine (COMPAZINE) 10 MG tablet TAKE 1 TABLET BY MOUTH EVERY 6 HOURS AS NEEDED (Patient not taking: No sig reported) 60 tablet 0  . saccharomyces boulardii (FLORASTOR) 250 MG capsule Take 1 capsule (250 mg total) by mouth 2 (two) times daily. (Patient not taking: No sig reported) 20 capsule 0  . senna-docusate (SENOKOT S) 8.6-50 MG tablet Take 1 tablet by mouth daily. 90 tablet 1   No current facility-administered medications on file prior to visit.    No Known Allergies  Social History   Socioeconomic History  . Marital status: Married    Spouse name: Not on file  . Number of children: 2  . Years of education: Not on file  . Highest education level: 6th grade  Occupational History  . 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 Smoking status: Former Smoker    Quit date: 1998    Years since quitting: 24.4  . Smokeless tobacco: Never Used  . Tobacco comment: 01/27/2020 - Pt denied smoking cigarettes  Vaping Use  . Vaping Use: Never used  Substance and Sexual Activity  . Alcohol use: Not Currently  . Drug use: Not Currently  . Sexual activity: Not on file  Other Topics Concern  . Not on file  Social History Narrative  . Not on file   Social Determinants of Health   Financial Resource Strain: Not on file  Food Insecurity: Not on file  Transportation Needs: Not on file  Physical Activity: Not on file  Stress: Not on file  Social Connections: Not on file  Intimate Partner Violence: Not on file    Family History  Family history unknown: Yes     Past Surgical History:  Procedure Laterality Date  . BIOPSY  01/24/2020   Procedure: BIOPSY;  Surgeon: Wonda Horner, MD;  Location: [facility];  Service: Endoscopy;;  . ESOPHAGOGASTRODUODENOSCOPY (EGD) WITH PROPOFOL N/A 01/24/2020   Procedure: ESOPHAGOGASTRODUODENOSCOPY (EGD) WITH PROPOFOL;  Surgeon: Wonda Horner, MD;  Location: [facility] ENDOSCOPY;  Service: Endoscopy;  Laterality: N/A;  . IR CATHETER TUBE CHANGE  02/18/2020  . IR RADIOLOGIST EVAL & MGMT  02/15/2020  . IR RADIOLOGIST EVAL & MGMT  03/09/2020  . SCLEROTHERAPY  01/24/2020   Procedure: SCLEROTHERAPY;  Surgeon: Wonda Horner, MD;  Location: [facility] ENDOSCOPY;  Service: Endoscopy;;    ROS: Review of Systems Negative except as stated above  PHYSICAL EXAM: BP (!) 208/106   Pulse 73   Resp 16   Ht 5\' 1"  (1.549 m)   Wt 158 lb 9.6 oz (71.9 kg)   SpO2 95%   BMI 29.97 kg/m   Physical Exam  General appearance - alert, well appearing, Hispanic male who looks younger than stated age.  And in no distress Mental status -patient is very talkative and has to be redirected at times.  He answers questions appropriately.  Normal mood, behavior, speech, dress, motor activity, and thought processes Neck - supple, no significant adenopathy Chest - clear to auscultation, no wheezes, rales or rhonchi, symmetric air entry Heart - normal rate, regular rhythm, normal S1, S2, no murmurs, rubs, clicks or gallops Abdomen - soft, nontender, nondistended, no masses or organomegaly Extremities - peripheral pulses normal, no pedal edema, no clubbing or cyanosis  CMP Latest Ref Rng & Units 04/23/2021 03/07/2021 01/22/2021  Glucose 70 - 99 mg/dL 111(H) 123(H) 135(H)  BUN 8 - 23 mg/dL 13 12 14   Creatinine 0.61 - 1.24 mg/dL 0.87 0.97 1.03  Sodium 135 - 145 mmol/L 138 138 136  Potassium 3.5 - 5.1 mmol/L 3.4(L) 3.8 3.2(L)  Chloride 98 - 111 mmol/L 103 104 106  CO2 22 - 32 mmol/L 26 23 24   Calcium 8.9 - 10.3 mg/dL 8.8(L) 8.5(L) 8.8(L)  Total Protein 6.5 -  8.1 g/dL 7.4 7.9 8.1  Total Bilirubin 0.3 - 1.2 mg/dL 0.6 0.6 0.6  Alkaline Phos 38 - 126 U/L 82 108 107  AST 15 - 41 U/L 16 16 19   ALT 0 - 44 U/L 10 12 16    Lipid Panel  No results found for: CHOL, TRIG, HDL, CHOLHDL, VLDL, LDLCALC, LDLDIRECT  CBC    Component Value Date/Time   WBC 4.1 04/23/2021 1115   WBC 5.5 07/16/2020 1926   RBC 4.20 (L) 04/23/2021 1115   HGB 12.9 (  L) 04/23/2021 1115   HCT 37.7 (L) 04/23/2021 1115   PLT 250 04/23/2021 1115   MCV 89.8 04/23/2021 1115   MCH 30.7 04/23/2021 1115   MCHC 34.2 04/23/2021 1115   RDW 13.0 04/23/2021 1115   LYMPHSABS 1.4 04/23/2021 1115   MONOABS 0.3 04/23/2021 1115   EOSABS 0.1 04/23/2021 1115   BASOSABS 0.0 04/23/2021 1115    ASSESSMENT AND PLAN: 1. Encounter to establish care  2. Essential hypertension Not at goal and significantly elevated.  Continue Norvasc.  Add lisinopril 10 mg daily.  Patient has a lab scheduled next week through his oncologist to include chemistry.  DASH diet discussed and encouraged. - lisinopril (ZESTRIL) 10 MG tablet; Take 1 tablet (10 mg total) by mouth daily.  Dispense: 30 tablet; Refill: 3  3. Hypokalemia Likely due to the medication Gleevac.  We will see if having him on lisinopril helps maintain potassium levels.  He will continue the potassium supplement as prescribed by the oncologist.  4. Malignant gastrointestinal stromal tumor (GIST) of stomach ([place]) Being managed by oncology.    Patient was given the opportunity to ask questions.  Patient verbalized understanding of the plan and was able to repeat key elements of the plan.    No orders of the defined types were placed in this encounter.    Requested Prescriptions   Signed Prescriptions Disp Refills  . lisinopril (ZESTRIL) 10 MG tablet 30 tablet 3    Sig: Take 1 tablet (10 mg total) by mouth daily.    Return in about 6 weeks (around 06/22/2021) for F/U with [facility] in 2 wks for BP recheck.  Karle Plumber, MD, FACP

## 2021-05-11 NOTE — Patient Instructions (Signed)
Hipertensin en los adultos Hypertension, Adult La presin arterial alta (hipertensin) se produce cuando la fuerza de la sangre bombea a travs de las arterias con mucha fuerza. Las arterias son los vasos sanguneos que transportan la sangre desde el corazn al resto del cuerpo. La hipertensin hace que el corazn haga ms esfuerzo para Chiropodist y [employer] que las arterias se Teacher, music o Advertising account executive. La hipertensin no tratada o no controlada puede causar infarto de miocardio, insuficiencia cardaca, accidente cerebrovascular, enfermedad renal y otros problemas. Una lectura de la presin arterial consta de un nmero ms alto sobre un nmero ms bajo. En condiciones ideales, la presin arterial debe estar por debajo de 120/80. El primer nmero ("superior") es la presin sistlica. Es la medida de la presin de las arterias cuando el corazn late. El segundo nmero ("inferior") es la presin diastlica. Es la medida de la presin en las arterias cuando el corazn se relaja. Cules son las causas? Se desconoce la causa exacta de esta afeccin. Hay algunas afecciones que causan presin arterial alta o estn relacionadas con ella. Qu incrementa el riesgo? Algunos factores de riesgo de hipertensin estn bajo su control. Los siguientes factores pueden hacer que sea ms propenso a Armed forces training and education officer afeccin:  Fumar.  Tener diabetes mellitus tipo 2, colesterol alto, o ambos.  No hacer la cantidad suficiente de actividad fsica o ejercicio.  Tener sobrepeso.  Consumir mucha grasa, azcar, caloras o sal (sodio) en su dieta.  Beber alcohol en exceso. Algunos factores de riesgo para la presin arterial alta pueden ser difciles o imposibles de Quarry manager. Algunos de estos factores son los siguientes:  Tener enfermedad renal crnica.  Tener antecedentes familiares de presin arterial alta.  Edad. Los riesgos aumentan con la edad.  Raza. El riesgo es mayor para las Administrator, arts.  Sexo. Antes de los 45aos, los hombres corren ms [employer]. Despus de los 65aos, las mujeres corren ms [employer].  Tener apnea obstructiva del sueo.  Estrs. Cules son los signos o los sntomas? Es posible que la presin arterial alta puede no cause sntomas. La presin arterial muy alta (crisis hipertensiva) puede provocar:  Dolor de cabeza.  Ansiedad.  Falta de aire.  Hemorragia nasal.  Nuseas y vmitos.  Cambios en la visin.  Dolor de pecho intenso.  Convulsiones. Cmo se diagnostica? Esta afeccin se diagnostica al medir su presin arterial mientras se encuentra sentado, con el brazo apoyado sobre una superficie plana, las piernas sin cruzar y los pies bien apoyados en el piso. El brazalete del tensimetro debe colocarse directamente sobre la piel de la parte superior del brazo y al nivel de su corazn. Debe medirla al [facility] veces en el mismo brazo. Determinadas condiciones pueden causar una diferencia de presin arterial entre el brazo izquierdo y Insurance underwriter. Ciertos factores pueden provocar que las lecturas de la presin arterial sean inferiores o superiores a lo normal por un perodo corto de tiempo:  Si su presin arterial es ms alta cuando se encuentra en el consultorio del mdico que cuando la mide en su hogar, se denomina "hipertensin de bata blanca". La [employer] de las personas que tienen esta afeccin no deben ser [employer].  Si su presin arterial es ms alta en el hogar que cuando se encuentra en el consultorio del mdico, se denomina "hipertensin enmascarada". La [employer] de las personas que tienen esta afeccin deben ser medicadas para Chief Technology Officer la presin arterial. Si tiene una lecturas de presin arterial alta durante  una visita o si tiene presin arterial normal con otros factores de riesgo, se le podr pedir que haga lo siguiente:  Que regrese otro da para volver a Chief Technology Officer su presin arterial  nuevamente.  Que se controle la presin arterial en su casa durante 1 semana o ms. Si se le diagnostica hipertensin, es posible que se le realicen otros anlisis de sangre o estudios de diagnstico por imgenes para ayudar a su mdico a comprender su riesgo general de tener otras afecciones. Cmo se trata? Esta afeccin se trata haciendo cambios saludables en el estilo de vida, tales como ingerir alimentos saludables, realizar ms ejercicio y reducir el consumo de alcohol. El mdico puede recetarle medicamentos si los cambios en el estilo de vida no son suficientes para Child psychotherapist la presin arterial y si:  Su presin arterial sistlica est por encima de 130.  Su presin arterial diastlica est por encima de 80. La presin arterial deseada puede variar en funcin de las enfermedades, la edad y otros factores personales. Siga estas instrucciones en su casa: Comida y bebida  Siga una dieta con alto contenido de fibras y [place], y con bajo contenido de sodio, Location manager agregada y Physicist, medical. Un ejemplo de plan alimenticio es la dieta DASH (Dietary Approaches to Stop Hypertension, Mtodos alimenticios para detener la hipertensin). Para alimentarse de esta manera: ? Coma mucha fruta y Berkley. Trate de que la mitad del plato de cada comida sea de frutas y verduras. ? Coma cereales integrales, como pasta integral, arroz integral o pan integral. Llene aproximadamente un cuarto del plato con cereales integrales. ? Coma y beba productos lcteos con bajo contenido de grasa, como leche descremada o yogur bajo en grasas. ? Evite la ingesta de cortes de carne grasa, carne procesada o curada, y carne de ave con piel. Llene aproximadamente un cuarto del plato con protenas magras, como pescado, pollo sin piel, frijoles, huevos o tofu. ? Evite ingerir alimentos prehechos y procesados. En general, estos tienen mayor cantidad de sodio, azcar agregada y Wendee Copp.  Reduzca su ingesta diaria de sodio. La  mayora de las personas que tienen hipertensin deben comer menos de 1500 mg de sodio por [employer].  No beba alcohol si: ? Su mdico le indica no hacerlo. ? Est embarazada, puede estar embarazada o est tratando de quedar embarazada.  Si bebe alcohol: ? Limite la cantidad que bebe a lo siguiente:  De 0 a 1 medida por da para las mujeres.  De 0 a 2 medidas por da para los hombres. ? Est atento a la cantidad de alcohol que hay en las bebidas que toma. En los [place], una medida equivale a una botella de cerveza de 12oz (357ml), un vaso de vino de 5oz (165ml) o un vaso de una bebida alcohlica de alta graduacin de 1oz (13ml).   Estilo de vida  Trabaje con su mdico para mantener un peso saludable o Administrator, Civil Service. Pregntele cul es el peso recomendado para usted.  Haga al menos 62minutos de ejercicio la [employer] de la [place]. Estas actividades pueden incluir caminar, nadar o andar en bicicleta.  Incluya ejercicios para fortalecer sus msculos (ejercicios de resistencia), como Pilates o levantamiento de pesas, como parte de su rutina semanal de ejercicios. Intente realizar 6minutos de este tipo de ejercicios al [employer] a la [place].  No consuma ningn producto que contenga nicotina o tabaco, como cigarrillos, cigarrillos electrnicos y tabaco de Higher education careers adviser. Si necesita ayuda para dejar de fumar, consulte al  mdico.  Contrlese la presin arterial en su casa segn las indicaciones del mdico.  Concurra a todas las visitas de seguimiento como se lo haya indicado el mdico. Esto es importante.   Medicamentos  [employer] de venta libre y los recetados solamente como se lo haya indicado el mdico. Siga cuidadosamente las indicaciones. Los medicamentos para la presin arterial deben tomarse segn las indicaciones.  No omita las dosis de medicamentos para la presin arterial. Si lo hace, estar en riesgo de tener problemas y puede hacer que los medicamentos  sean menos eficaces.  Pregntele a su mdico a qu efectos secundarios o reacciones a los Careers information officer. Comunquese con un mdico si:  Piensa que tiene una reaccin a un medicamento que est tomando.  Tiene dolores de cabeza frecuentes (recurrentes).  Se siente mareado.  Tiene hinchazn en los tobillos.  Tiene problemas de visin. Solicite ayuda inmediatamente si:  Siente un dolor de cabeza intenso o confusin.  Siente debilidad inusual o adormecimiento.  Siente que va a desmayarse.  Siente un dolor intenso en el pecho o el abdomen.  Vomita repetidas veces.  Tiene dificultad para respirar. Resumen  La hipertensin se produce cuando la sangre bombea en las arterias con mucha fuerza. Si esta afeccin no se controla, podra correr riesgo de tener complicaciones graves.  La presin arterial deseada puede variar en funcin de las enfermedades, la edad y otros factores personales. Para la [employer], una presin arterial normal es menor que 120/80.  La hipertensin se trata con cambios en el estilo de vida, medicamentos o una combinacin de [place]. Los McDonald[employer] Corporation estilo de vida incluyen prdida de peso, ingerir alimentos sanos, seguir una dieta baja en sodio, hacer ms ejercicio y Environmental consultant consumo de alcohol. Esta informacin no tiene Marine scientist el consejo del mdico. Asegrese de hacerle al mdico cualquier pregunta que tenga. Document Revised: 09/10/2018 Document Reviewed: 09/10/2018 Elsevier Patient Education  2021 [employer].

## 2021-05-14 ENCOUNTER — Other Ambulatory Visit (HOSPITAL_COMMUNITY): Payer: Self-pay

## 2021-05-16 ENCOUNTER — Other Ambulatory Visit (HOSPITAL_COMMUNITY): Payer: Self-pay

## 2021-05-17 ENCOUNTER — Other Ambulatory Visit (HOSPITAL_COMMUNITY): Payer: Self-pay

## 2021-05-22 NOTE — Progress Notes (Unsigned)
   S:     PCP: Dr. Wynetta Emery  Patient arrives ***. Presents to the clinic for hypertension evaluation, counseling, and management.  Patient was referred and last seen by Primary Care Provider on 05/11/21. At that visit, BP was 208/106 and lisinopril 10 mg daily was added.  Today, patient reports ***  Compliance? Took meds this morning? When do you take your meds? Dizziness, headaches, blurred vision? History of swelling? Check Clinic BP? Home BP logs? If no logs, bring to next visit w/ BP cuff Go over BP goals Additional BP therapy if needed Increase lisinopril - labs scheduled at oncology appointment on 6/28 (no current labs reflective on lisinopril initiation) - history of hypokalemia secondary to ?gleevec and on potassium supplement per oncology Avoid thiazides - hypokalemia  Diet??  Exercise??   Medication adherence *** .  Current BP Medications include: amlodipine 10 mg daily (PM), lisinopril 10 mg daily  Dietary habits include: *** Exercise habits include:*** Family / Social history: -Fhx: unknown -Tobacco use: former smoker quit in 1998  O:   Home BP readings: ***  Last 3 Office BP readings: BP Readings from Last 3 Encounters:  05/11/21 (!) 208/106  04/23/21 (!) 172/92  03/07/21 (!) 195/97    BMET    Component Value Date/Time   NA 138 04/23/2021 1115   K 3.4 (L) 04/23/2021 1115   CL 103 04/23/2021 1115   CO2 26 04/23/2021 1115   GLUCOSE 111 (H) 04/23/2021 1115   BUN 13 04/23/2021 1115   CREATININE 0.87 04/23/2021 1115   CALCIUM 8.8 (L) 04/23/2021 1115   GFRNONAA >60 04/23/2021 1115   GFRAA >60 09/01/2020 1206    Renal function: CrCl cannot be calculated (Patient's most recent lab result is older than the maximum 21 days allowed.).  Clinical ASCVD: {YES/NO:21197} The ASCVD Risk score Mikey Bussing DC Jr., et al., 2013) failed to calculate for the following reasons:   The valid systolic blood pressure range is 90 to 200 mmHg   Cannot find a previous HDL  lab   Cannot find a previous total cholesterol lab   A/P: Hypertension diagnosed *** currently *** on current medications. BP Goal = <130/80 mmHg. Medication adherence ***.  -{Meds adjust:18428} ***.  -F/u labs ordered - *** -Counseled on lifestyle modifications for blood pressure control including reduced dietary sodium, increased exercise, adequate sleep.  Results reviewed and written information provided.   Total time in face-to-face counseling *** minutes.   F/U Clinic Visit in ***.    Lorel Monaco, PharmD, [place] PGY2 Ambulatory Care Resident [place]

## 2021-05-23 ENCOUNTER — Ambulatory Visit: Payer: Self-pay | Admitting: Pharmacist

## 2021-05-25 ENCOUNTER — Other Ambulatory Visit (HOSPITAL_COMMUNITY): Payer: Self-pay

## 2021-06-05 ENCOUNTER — Inpatient Hospital Stay: Payer: Self-pay

## 2021-06-05 ENCOUNTER — Inpatient Hospital Stay: Payer: Self-pay | Admitting: Nurse Practitioner

## 2021-06-06 ENCOUNTER — Other Ambulatory Visit: Payer: Self-pay

## 2021-06-06 ENCOUNTER — Encounter (HOSPITAL_BASED_OUTPATIENT_CLINIC_OR_DEPARTMENT_OTHER): Payer: Self-pay

## 2021-06-06 ENCOUNTER — Inpatient Hospital Stay: Payer: Self-pay | Attending: Oncology

## 2021-06-06 ENCOUNTER — Ambulatory Visit (HOSPITAL_BASED_OUTPATIENT_CLINIC_OR_DEPARTMENT_OTHER)
Admission: RE | Admit: 2021-06-06 | Discharge: 2021-06-06 | Disposition: A | Discharge status: Home / Self Care | Payer: Self-pay | Source: Ambulatory Visit | Attending: Oncology | Admitting: Oncology

## 2021-06-06 DIAGNOSIS — C49A2 Gastrointestinal stromal tumor of stomach: Secondary | ICD-10-CM | POA: Insufficient documentation

## 2021-06-06 LAB — CMP (CANCER CENTER ONLY)
ALT: 17 U/L (ref 0–44)
AST: 19 U/L (ref 15–41)
Albumin: 4.3 g/dL (ref 3.5–5.0)
Alkaline Phosphatase: 85 U/L (ref 38–126)
Anion gap: 8 (ref 5–15)
BUN: 17 mg/dL (ref 8–23)
CO2: 26 mmol/L (ref 22–32)
Calcium: 9.2 mg/dL (ref 8.9–10.3)
Chloride: 102 mmol/L (ref 98–111)
Creatinine: 0.88 mg/dL (ref 0.61–1.24)
GFR, Estimated: >60 mL/min (ref >60)
Glucose, Bld: 109 mg/dL — ABNORMAL HIGH (ref 70–99)
Potassium: 3.7 mmol/L (ref 3.5–5.1)
Sodium: 136 mmol/L (ref 135–145)
Total Bilirubin: 0.8 mg/dL (ref 0.3–1.2)
Total Protein: 7.6 g/dL (ref 6.5–8.1)

## 2021-06-06 LAB — CBC WITH DIFFERENTIAL (CANCER CENTER ONLY)
Abs Immature Granulocytes: 0.01 10*3/uL (ref 0.00–0.07)
Basophils Absolute: 0 10*3/uL (ref 0.0–0.1)
Basophils Relative: 0 %
Eosinophils Absolute: 0.1 10*3/uL (ref 0.0–0.5)
Eosinophils Relative: 3 %
HCT: 41.9 % (ref 39.0–52.0)
Hemoglobin: 14.6 g/dL (ref 13.0–17.0)
Immature Granulocytes: 0 %
Lymphocytes Relative: 37 %
Lymphs Abs: 1.6 10*3/uL (ref 0.7–4.0)
MCH: 30.8 pg (ref 26.0–34.0)
MCHC: 34.8 g/dL (ref 30.0–36.0)
MCV: 88.4 fL (ref 80.0–100.0)
Monocytes Absolute: 0.4 10*3/uL (ref 0.1–1.0)
Monocytes Relative: 10 %
Neutro Abs: 2.2 10*3/uL (ref 1.7–7.7)
Neutrophils Relative %: 50 %
Platelet Count: 257 10*3/uL (ref 150–400)
RBC: 4.74 MIL/uL (ref 4.22–5.81)
RDW: 12.8 % (ref 11.5–15.5)
WBC Count: 4.3 10*3/uL (ref 4.0–10.5)
nRBC: 0 % (ref 0.0–0.2)

## 2021-06-06 LAB — POCT I-STAT CREATININE: Creatinine, Ser: 0.7 mg/dL (ref 0.61–1.24)

## 2021-06-06 MED ORDER — IOHEXOL 300 MG/ML  SOLN
85.0000 mL | Freq: Once | INTRAMUSCULAR | Status: AC | PRN
  Administered 2021-06-06: 85 mL via INTRAVENOUS

## 2021-06-08 ENCOUNTER — Inpatient Hospital Stay: Payer: Medicare Other

## 2021-06-08 ENCOUNTER — Inpatient Hospital Stay: Payer: Medicare Other | Attending: Oncology | Admitting: Nurse Practitioner

## 2021-06-08 ENCOUNTER — Other Ambulatory Visit: Payer: Self-pay

## 2021-06-08 DIAGNOSIS — C49A2 Gastrointestinal stromal tumor of stomach: Secondary | ICD-10-CM | POA: Insufficient documentation

## 2021-06-08 HISTORY — DX: Gastrointestinal stromal tumor of stomach: C49.A2

## 2021-06-13 ENCOUNTER — Telehealth: Payer: Self-pay | Admitting: Nurse Practitioner

## 2021-06-13 NOTE — Telephone Encounter (Signed)
I called and left a VM for patient regarding his appointments from 7/1 have been rescheduled for 7/7 @ 1:15.  I also called his spouse Bess Harvest and left her the same message per 7/5 sch msg

## 2021-06-14 ENCOUNTER — Inpatient Hospital Stay: Payer: Medicare Other

## 2021-06-14 ENCOUNTER — Other Ambulatory Visit: Payer: Self-pay

## 2021-06-14 ENCOUNTER — Encounter: Payer: Self-pay | Admitting: *Deleted

## 2021-06-14 ENCOUNTER — Inpatient Hospital Stay: Payer: Medicare Other | Admitting: Nurse Practitioner

## 2021-06-14 DIAGNOSIS — C49A2 Gastrointestinal stromal tumor of stomach: Secondary | ICD-10-CM

## 2021-06-14 NOTE — Progress Notes (Signed)
"  No show" for lab/OV today. Scheduling message sent to reschedule him.

## 2021-06-15 ENCOUNTER — Other Ambulatory Visit: Payer: Self-pay | Admitting: *Deleted

## 2021-06-15 DIAGNOSIS — C49A2 Gastrointestinal stromal tumor of stomach: Secondary | ICD-10-CM

## 2021-06-18 ENCOUNTER — Inpatient Hospital Stay: Payer: Medicare Other

## 2021-06-18 ENCOUNTER — Other Ambulatory Visit (HOSPITAL_COMMUNITY): Payer: Self-pay

## 2021-06-18 ENCOUNTER — Inpatient Hospital Stay: Payer: Medicare Other | Admitting: Oncology

## 2021-06-21 ENCOUNTER — Ambulatory Visit: Payer: Self-pay | Admitting: Internal Medicine

## 2021-06-22 ENCOUNTER — Other Ambulatory Visit: Payer: Self-pay

## 2021-06-22 ENCOUNTER — Inpatient Hospital Stay: Payer: Medicare Other

## 2021-06-22 ENCOUNTER — Inpatient Hospital Stay (HOSPITAL_BASED_OUTPATIENT_CLINIC_OR_DEPARTMENT_OTHER): Payer: Medicare Other | Admitting: Oncology

## 2021-06-22 VITALS — BP 198/100 | HR 68 | Temp 97.7°F | Resp 18 | Wt 158.0 lb

## 2021-06-22 DIAGNOSIS — C49A2 Gastrointestinal stromal tumor of stomach: Secondary | ICD-10-CM | POA: Diagnosis present

## 2021-06-22 NOTE — Progress Notes (Signed)
[facility] OFFICE PROGRESS NOTE   Diagnosis: Gastrointestinal stromal tumor  INTERVAL HISTORY:   Kevin [name] returns after missing scheduled follow-up visits.  He is here with a [name] interpreter.  He continues [place].  He denies mouth sores, rash, swelling, and diarrhea.  No complaint except for itching of the hands after his blood pressure was checked today.  He has not taken his blood pressure medication today.  Objective:  Vital signs in last 24 hours:  Blood pressure (!) 198/100, pulse 68, temperature 97.7 F (36.5 C), temperature source Oral, resp. rate 18, weight 158 lb (71.7 kg), SpO2 97 %.    HEENT: No thrush or ulcers Resp: Lungs clear bilaterally Cardio: Regular rate and rhythm GI: No mass, no hepatosplenomegaly, nontender Vascular: No leg edema  Skin: No rash, mild dryness of the palms  Portacath/PICC-without erythema  Lab Results:  Lab Results  Component Value Date   WBC 4.3 06/06/2021   HGB 14.6 06/06/2021   HCT 41.9 06/06/2021   MCV 88.4 06/06/2021   PLT 257 06/06/2021   NEUTROABS 2.2 06/06/2021    CMP  Lab Results  Component Value Date   NA 136 06/06/2021   K 3.7 06/06/2021   CL 102 06/06/2021   CO2 26 06/06/2021   GLUCOSE 109 (H) 06/06/2021   BUN 17 06/06/2021   CREATININE 0.70 06/06/2021   CALCIUM 9.2 06/06/2021   PROT 7.6 06/06/2021   ALBUMIN 4.3 06/06/2021   AST 19 06/06/2021   ALT 17 06/06/2021   ALKPHOS 85 06/06/2021   BILITOT 0.8 06/06/2021   GFRNONAA >60 06/06/2021   GFRAA >60 09/01/2020    Imaging: CT images from 06/06/2021 reviewed with Kevin [name]   Medications: I have reviewed the patient's current medications.   Assessment/Plan:  1. GIST of the stomach CT abdomen/pelvis 01/21/2020-large necrotic mass centered in the posterior wall of the stomach, irregular hypodense mass right lobe liver  Upper endoscopy 01/24/2020-large infiltrative mass with no bleeding found in the posterior wall of the stomach.   Biopsy-gastrointestinal stromal tumor  MRI liver 01/28/2020-large exophytic centrally necrotic 17.3 x 12.3 cm gastric mass arising from posterior proximal stomach centered in the left upper quadrant with thick enhancing wall.  Complex multilocular 10.7 x 8.2 cm cystic liver mass right liver lobe.   Neoadjuvant Gleevec 01/28/2020 CT 03/03/2020, resolution of fluid component of right hepatic abscess, unchanged mass posterior to the gastric body, unchanged left periaortic and aortocaval adenopathy CT 06/01/2020-decreased size of a multilobulated mass arising from the posterior stomach, unchanged retroperitoneal lymph nodes, removal of liver drain resolution of abscess CT 09/14/20-no significant change in the large left upper quadrant mass arising from the proximal stomach.  Near complete resolution of right hepatic lobe abscess. CT 06/06/2021-no change in mass arising from the posterior gastric fundus, no lymphadenopathy or evidence of metastatic disease 2. Liver lesion in the right lobe             -Liver mass consistent with abscess on MRI of the liver from 01/27/2020             -Status post CT-guided drainage of the liver abscess on 01/28/2020-culture positive for Streptococcus intermedius             -IV antibiotics and discharged home on 10-day course of Augmentin             -CT 03/03/2020-clearance of fluid component of liver abscess, unchanged mass posterior to the gastric body, stable left periaortic and aortocaval adenopathy             -  Abscess drain removed 03/09/2020             -Liver abscess resolved on CT 06/01/2020 and 09/14/2020 3. Iron deficiency anemia-oral iron,  resolved 4. Gastritis and H. pylori infection 5.  History of fever secondary to the liver abscess 6. Protein calorie malnutrition-resolved     Disposition: Mr. Joss appears stable.  He continues to tolerate the [place] well.  There is no clinical or radiologic evidence of disease progression.  He continues to have a large  gastric mass.  I explained the only potentially curative therapy is surgery.  The gastrointestinal stromal tumor appears to be stable on Gleevec, but this will not be curative and he will likely develop resistance to Gleevec in the future.  He request a Museum/gallery conservator.  We will make referral to Dr. Zenia Resides.  He will continue Gleevec.  He will return for an office and lab visit in approximately 1 month.  We encouraged him to resume his blood pressure medication.  Betsy Coder, MD  06/22/2021  3:24 PM

## 2021-06-25 ENCOUNTER — Encounter: Payer: Self-pay | Admitting: *Deleted

## 2021-06-25 NOTE — Progress Notes (Signed)
Faxed referral order, demographics and chart information to [facility]--Dr. Michaelle Birks for new patient referral.

## 2021-07-06 ENCOUNTER — Ambulatory Visit: Payer: Self-pay | Admitting: Surgery

## 2021-07-06 NOTE — H&P (Signed)
History of Present Illness: Kevin [name] is a 70 y.o. male who is seen today as an office consultation at the request of Dr. Benay Spice for evaluation of a gastric GIST. History was obtained with the assistance of a Spanish interpreter via phone. He was initially found to have a large gastric mass on imaging in February 2021. He underwent EGD which confirmed a large mass on the posterior wall of the body/fundus of the stomach. Biopsy confirmed a gastrointestinal stromal tumor. He was started on neoadjuvant Gleevec on 01/28/20. At that time he also had a large abscess in the right liver, which was percutaneously drained (cultures positive for Strep intermedius). He completed a course of antibiotics and follow-up imaging has confirmed resolution of the abscess.    He has remained on Gleevec and his gastric mass has remained stable in size. He saw Dr. Barry Dienes in November 2021, and robotic partial gastrectomy was recommended however he never scheduled surgery and decided that he preferred not to have surgery at that time. He is followed by Dr. Benay Spice and has remained on [place] but has declined to have surgery until his recent visit on 7/15. Surgery was discussed as the only potentially curative treatment and the patient requested a second opinion. He has been referred to me.   Today he reports he is feeling well. He has had no loss of appetite or abdominal pain. He is under the impression that his tumor has resolved and is no longer present. I discussed that although his tumor has not progressed or spread while he has been on [place], it is still present and will not resolve without surgical resection. He is here with his wife, and he is very concerned about the risks of surgery.     Review of Systems: A complete review of systems was obtained from the patient.  I have reviewed this information and discussed as appropriate with the patient.  See HPI as well for other ROS.         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 No pertinent surgical history.     Allergies No Known Allergies    Current Outpatient Medications on File Prior to Visit Medication Sig Dispense Refill  amLODIPine (NORVASC) 10 MG tablet Take 1 tablet by mouth once daily      ferrous sulfate 325 (65 FE) MG tablet Take 325 mg by mouth 2 (two) times daily with meals      Saccharomyces boulardii (FLORASTOR) 250 mg capsule Take 250 mg by mouth 2 (two) times daily      sennosides-docusate (SENOKOT-S) 8.6-50 mg tablet Take 1 tablet by mouth once daily      mirtazapine (REMERON) 15 MG tablet TAKE 1 TABLET (15 MG TOTAL) BY MOUTH AT BEDTIME.       No current facility-administered medications on file prior to visit.     Family History History reviewed. No pertinent family history.     Social History   Tobacco Use Smoking Status Never Smoker Smokeless Tobacco Never Used     Social History Social History    Socioeconomic History  Marital status: Married Tobacco Use  Smoking status: Never Smoker  Smokeless tobacco: Never Used Surveyor, mining Use: Never used Substance and Sexual Activity  Alcohol use: Never  Drug use:  Never      Objective:     Vitals:   07/06/21 1422 BP: (!) 180/80 Pulse: 78 Temp: 36.8 C (98.3 F) SpO2: 98% Weight: 71.4 kg (157 lb 6.4 oz) Height: 157.5 cm ('5\' 2"'$ )   Body mass index is 28.79 kg/m.   Physical Exam Vitals reviewed.  Constitutional:      General: He is not in acute distress.    Appearance: Normal appearance.  HENT:     Head: Normocephalic and atraumatic.  Eyes:     General: No scleral icterus.    Conjunctiva/sclera: Conjunctivae normal.  Cardiovascular:     Rate and Rhythm: Normal rate and regular rhythm.     Heart sounds: No murmur  heard. Pulmonary:     Effort: Pulmonary effort is normal. No respiratory distress.     Breath sounds: Normal breath sounds. No stridor.  Abdominal:     General: There is no distension.     Palpations: Abdomen is soft. There is no mass.     Tenderness: There is no abdominal tenderness.     Comments: No palpable mass. No surgical scars.  Musculoskeletal:        General: No swelling or deformity. Normal range of motion.     Cervical back: Normal range of motion.  Skin:    General: Skin is warm and dry.  Neurological:     General: No focal deficit present.     Mental Status: He is alert and oriented to person, place, and time.  Psychiatric:        Mood and Affect: Mood normal.        Behavior: Behavior normal.        Thought Content: Thought content normal.            Assessment and Plan: Diagnoses and all orders for this visit:   Malignant gastrointestinal stromal tumor (GIST) of stomach (CMS-HCC)       70 yo male with a large GIST of the posterior proximal stomach. I personally reviewed the patient's EGD reports and imaging, which shows a large mass that appears to originate from the proximal stomach on the lateral aspect. The prior liver abscess has completely resolved. There is no evidence of metastatic disease and the GIST has not progressed on Gleevec. As surgery is the only curative treatment and this is a resectable tumor, I recommend resection via a laparoscopic partial gastrectomy. I anticipate this mass is separate from the splenic vessels, but it is a possible a splenectomy and/or distal pancreatectomy would be required to achieve complete resection. I discussed the procedure details with the patient. He is very concerned about the risks and is concerned he may not survive the operation. I explained that he is overall a good surgical candidate and that while no surgery is risk free, I think the overall risk for him is relatively low and the risks are far outweighed by the  benefits. The alternative to surgery would be to continue [place] but he is likely to develop resistance at some point and have progression of his disease. He agrees to proceed with surgery. His wife has also encouraged him to proceed with surgery. They will be contacted to schedule a date.

## 2021-07-12 ENCOUNTER — Other Ambulatory Visit (HOSPITAL_COMMUNITY): Payer: Self-pay

## 2021-07-23 ENCOUNTER — Inpatient Hospital Stay: Payer: Medicare Other

## 2021-07-23 ENCOUNTER — Inpatient Hospital Stay: Payer: Medicare Other | Attending: Oncology | Admitting: Nurse Practitioner

## 2021-07-27 ENCOUNTER — Telehealth: Payer: Self-pay | Admitting: Oncology

## 2021-07-27 ENCOUNTER — Encounter: Payer: Self-pay | Admitting: *Deleted

## 2021-07-27 NOTE — Progress Notes (Signed)
Per Dr. Benay Spice: Needs lab/OV 2-3 weeks after surgery on 08/09/21. Scheduling message sent.

## 2021-07-27 NOTE — Telephone Encounter (Signed)
Called patient per 8/19 sch msg. No answer . Left message for patient to call back to schedule an appt for a follow up appointment after 9/1 surgery.   Interpreter Dietrich Pates - ID # [id number]   Message:  Scheduling Message Entered by Tania Ade on 07/27/2021 at 11:12 AM Priority: Routine <No visit type provided>  Department: [facility]  Provider:   Scheduling Notes:  Needs OV w/BS and lab 2-3 weeks after his surgery on 08/09/21

## 2021-08-03 NOTE — Pre-Procedure Instructions (Signed)
Surgical Instructions    Your procedure is scheduled on Thursday 08/09/21.   Report to [facility] Main Entrance "A" at 05:30 A.M., then check in with the Admitting office.  Call this number if you have problems the morning of surgery:  [phone]   If you have any questions prior to your surgery date call [phone]: Open Monday-Friday 8am-4pm    Remember:  Do not or drink eat after midnight the night before your surgery     Take these medicines the morning of surgery with A SIP OF WATER   amLODipine (NORVASC)  Please follow your surgeon's instructions regarding imatinib (GLEEVEC).    As of today, STOP taking any Aspirin (unless otherwise instructed by your surgeon) Aleve, Naproxen, Ibuprofen, Motrin, Advil, Goody's, BC's, all herbal medications, fish oil, and all vitamins.                     Do NOT Smoke (Tobacco/Vaping) or drink Alcohol 24 hours prior to your procedure.  If you use a CPAP at night, you may bring all equipment for your overnight stay.   Contacts, glasses, piercing's, hearing aid's, dentures or partials may not be worn into surgery, please bring cases for these belongings.    For patients admitted to the hospital, discharge time will be determined by your treatment team.   Patients discharged the day of surgery will not be allowed to drive home, and someone needs to stay with them for 24 hours.  ONLY 1 SUPPORT PERSON MAY BE PRESENT WHILE YOU ARE IN SURGERY. IF YOU ARE TO BE ADMITTED ONCE YOU ARE IN YOUR ROOM YOU WILL BE ALLOWED TWO (2) VISITORS.  Minor children may have two parents present. Special consideration for safety and communication needs will be reviewed on a case by case basis.   Special instructions:   [facility]- Preparing For Surgery  Before surgery, you can play an important role. Because skin is not sterile, your skin needs to be as free of germs as possible. You can reduce the number of germs on your skin by washing with CHG  (chlorahexidine gluconate) Soap before surgery.  CHG is an antiseptic cleaner which kills germs and bonds with the skin to continue killing germs even after washing.    Oral Hygiene is also important to reduce your risk of infection.  Remember - BRUSH YOUR TEETH THE MORNING OF SURGERY WITH YOUR REGULAR TOOTHPASTE  Please do not use if you have an allergy to CHG or antibacterial soaps. If your skin becomes reddened/irritated stop using the CHG.  Do not shave (including legs and underarms) for at least 48 hours prior to first CHG shower. It is OK to shave your face.  Please follow these instructions carefully.   Shower the NIGHT BEFORE SURGERY and the MORNING OF SURGERY  If you chose to wash your hair, wash your hair first as usual with your normal shampoo.  After you shampoo, rinse your hair and body thoroughly to remove the shampoo.  Use CHG Soap as you would any other liquid soap. You can apply CHG directly to the skin and wash gently with a scrungie or a clean washcloth.   Apply the CHG Soap to your body ONLY FROM THE NECK DOWN.  Do not use on open wounds or open sores. Avoid contact with your eyes, ears, mouth and genitals (private parts). Wash Face and genitals (private parts)  with your normal soap.   Wash thoroughly, paying special attention to the area where  your surgery will be performed.  Thoroughly rinse your body with warm water from the neck down.  DO NOT shower/wash with your normal soap after using and rinsing off the CHG Soap.  Pat yourself dry with a CLEAN TOWEL.  Wear CLEAN PAJAMAS to bed the night before surgery  Place CLEAN SHEETS on your bed the night before your surgery  DO NOT SLEEP WITH PETS.   Day of Surgery: Shower with CHG soap. Do not wear jewelry, make up, nail polish, gel polish, artificial nails, or any other type of covering on natural nails including finger and toenails. If patients have artificial nails, gel coating, etc. that need to be removed  by a nail salon please have this removed prior to surgery. Surgery may need to be canceled/delayed if the surgeon/ anesthesia feels like the patient is unable to be adequately monitored. Do not wear lotions, powders, perfumes/colognes, or deodorant. Do not shave 48 hours prior to surgery.  Men may shave face and neck. Do not bring valuables to the hospital. [facility] is not responsible for any belongings or valuables. Wear Clean/Comfortable clothing the morning of surgery Remember to brush your teeth WITH YOUR REGULAR TOOTHPASTE.   Please read over the following fact sheets that you were given.

## 2021-08-06 ENCOUNTER — Inpatient Hospital Stay (HOSPITAL_COMMUNITY)
Admission: RE | Admit: 2021-08-06 | Discharge: 2021-08-06 | Disposition: A | Discharge status: Home / Self Care | Payer: Medicare Other | Source: Ambulatory Visit

## 2021-08-06 NOTE — Progress Notes (Signed)
Patient a no show for PAT appointment. Called listed number for patient and daughter with no answer. Interpreter left a voicemail on patient's wife phone to return call to [place] Stay. Left contact number on voicemail.

## 2021-08-08 ENCOUNTER — Encounter (HOSPITAL_COMMUNITY): Payer: Self-pay | Admitting: Anesthesiology

## 2021-08-08 ENCOUNTER — Encounter (HOSPITAL_COMMUNITY): Payer: Self-pay | Admitting: Surgery

## 2021-08-08 ENCOUNTER — Encounter (HOSPITAL_COMMUNITY): Payer: Self-pay | Admitting: Physician Assistant

## 2021-08-08 NOTE — Anesthesia Preprocedure Evaluation (Deleted)
Anesthesia Evaluation    Airway        Dental   Pulmonary neg pulmonary ROS, former smoker,           Cardiovascular hypertension, Pt. on medications      Neuro/Psych    GI/Hepatic Neg liver ROS, GIST   Endo/Other  negative endocrine ROS  Renal/GU negative Renal ROS     Musculoskeletal negative musculoskeletal ROS (+)   Abdominal   Peds negative pediatric ROS (+)  Hematology  (+) anemia ,   Anesthesia Other Findings   Reproductive/Obstetrics negative OB ROS                             Anesthesia Physical Anesthesia Plan  ASA: 4  Anesthesia Plan: General   Post-op Pain Management:    Induction: Intravenous  PONV Risk Score and Plan: 2  Airway Management Planned: Oral ETT  Additional Equipment: None  Intra-op Plan:   Post-operative Plan: Extubation in OR  Informed Consent:   Plan Discussed with: Anesthesiologist  Anesthesia Plan Comments:         Anesthesia Quick Evaluation

## 2021-08-08 NOTE — Progress Notes (Signed)
DUE TO COVID-19 ONLY ONE VISITOR IS ALLOWED TO COME WITH YOU AND STAY IN THE WAITING ROOM ONLY DURING PRE OP AND PROCEDURE DAY OF SURGERY.  Two  VISITORS  MAY VISIT WITH YOU AFTER SURGERY IN YOUR PRIVATE ROOM DURING VISITING HOURS ONLY!  Patient is Spanish speaking, used [employer] ID [phone] (No answer), Tasia Catchings [id number] (no answer) and  Clifton James [phone] (no answer) for PAT information and instructions for DOS.  Unable to reach patient via phone.  LMOM with detailed instructions for DOS via [employer] ID [phone].  PCP - Dr Karle Plumber Cardiologist - n/a Oncology - Dr Betsy Coder  Chest x-ray - n/a EKG - DOS 08/09/21 Stress Test - n/a ECHO - n/a Cardiac Cath - n/a  Sleep Study -  n/a CPAP - n/a  STOP now taking any Aspirin (unless otherwise instructed by your surgeon), Aleve, Naproxen, Ibuprofen, Motrin, Advil, Goody's, BC's, all herbal medications, fish oil, and all vitamins.   Coronavirus Screening Covid test on DOS.  Do you have any of the following symptoms:  Cough yes/no: No Fever (>100.65F)  yes/no: No Runny nose yes/no: No Sore throat yes/no: No Difficulty breathing/shortness of breath  yes/no: No  Have you traveled in the last 14 days and where? yes/no: No  Patient verbalized understanding of instructions that were given via interpreter via phone.

## 2021-08-09 ENCOUNTER — Inpatient Hospital Stay (HOSPITAL_COMMUNITY): Admission: RE | Admit: 2021-08-09 | Payer: Medicare Other | Source: Home / Self Care | Admitting: Surgery

## 2021-08-09 ENCOUNTER — Encounter: Payer: Self-pay | Admitting: *Deleted

## 2021-08-09 HISTORY — DX: Essential (primary) hypertension: I10

## 2021-08-09 HISTORY — DX: Anemia, unspecified: D64.9

## 2021-08-09 SURGERY — LAPAROSCOPIC PARTIAL GASTRECTOMY
Anesthesia: General

## 2021-08-09 MED ORDER — MIDAZOLAM HCL 2 MG/2ML IJ SOLN
INTRAMUSCULAR | Status: AC
  Filled 2021-08-09: qty 2

## 2021-08-09 MED ORDER — PROPOFOL 10 MG/ML IV BOLUS
INTRAVENOUS | Status: AC
  Filled 2021-08-09: qty 20

## 2021-08-09 MED ORDER — FENTANYL CITRATE (PF) 250 MCG/5ML IJ SOLN
INTRAMUSCULAR | Status: AC
  Filled 2021-08-09: qty 5

## 2021-08-09 NOTE — Progress Notes (Signed)
Dr. Zenia Resides aware that we have been unable to reach patient and patient did not show for PAT appointment or DOS.  Multiple attempts made.

## 2021-08-09 NOTE — Progress Notes (Signed)
Patient was a "no show" for surgery today. Per Dr. Benay Spice: schedule for lab/OV in 1 month. Scheduling message sent.

## 2021-08-10 ENCOUNTER — Telehealth: Payer: Self-pay | Admitting: Nurse Practitioner

## 2021-08-10 NOTE — Telephone Encounter (Signed)
Appointment scheduled & updated calendar was mailed to patient's home address

## 2021-08-16 ENCOUNTER — Ambulatory Visit: Payer: Medicare Other | Attending: Internal Medicine | Admitting: Internal Medicine

## 2021-08-16 ENCOUNTER — Other Ambulatory Visit: Payer: Self-pay

## 2021-08-16 ENCOUNTER — Other Ambulatory Visit (HOSPITAL_COMMUNITY): Payer: Self-pay

## 2021-08-16 VITALS — BP 226/107 | HR 71 | Resp 16 | Wt 159.8 lb

## 2021-08-16 DIAGNOSIS — Z2821 Immunization not carried out because of patient refusal: Secondary | ICD-10-CM

## 2021-08-16 DIAGNOSIS — I10 Essential (primary) hypertension: Secondary | ICD-10-CM | POA: Insufficient documentation

## 2021-08-16 DIAGNOSIS — Z79899 Other long term (current) drug therapy: Secondary | ICD-10-CM | POA: Insufficient documentation

## 2021-08-16 DIAGNOSIS — C49A2 Gastrointestinal stromal tumor of stomach: Secondary | ICD-10-CM | POA: Diagnosis not present

## 2021-08-16 DIAGNOSIS — Z87891 Personal history of nicotine dependence: Secondary | ICD-10-CM | POA: Diagnosis not present

## 2021-08-16 MED ORDER — CLONIDINE HCL 0.1 MG PO TABS
0.1000 mg | ORAL_TABLET | Freq: Once | ORAL | Status: AC
  Administered 2021-08-16: 0.1 mg via ORAL

## 2021-08-16 MED ORDER — LISINOPRIL 10 MG PO TABS
10.0000 mg | ORAL_TABLET | Freq: Every day | ORAL | 6 refills | Status: DC
  Filled 2021-08-16: qty 30, 30d supply, fill #0
  Filled 2022-01-25: qty 30, 30d supply, fill #1

## 2021-08-16 MED ORDER — AMLODIPINE BESYLATE 10 MG PO TABS
ORAL_TABLET | Freq: Every day | ORAL | 6 refills | Status: DC
  Filled 2021-08-16: qty 30, 30d supply, fill #0
  Filled 2022-01-25: qty 30, 30d supply, fill #1

## 2021-08-16 NOTE — Progress Notes (Signed)
Patient ID: Kevin [name], male    DOB: 04/16/1951  MRN: [id number]  CC: Hypertension   Subjective: Kevin [name] is a 70 y.o. male who presents for chronic ds management His concerns today include:  Patient with malignant GIST, HTN, former smoker quit in 1998, [place] Currently taking: see medication list.  Lisinopril added to Norvasc on last visit. Med Adherence: '[x]'$  Yes.  Did not take meds today because he was coming to appt today and wanted to hold off until he sees me Medication side effects: '[]'$  Yes    '[x]'$  No Adherence with salt restriction: '[x]'$  Yes    '[]'$  No Home Monitoring?: '[x]'$  Yes -at [employer] Q2 wks to once a mth.  Monitoring Frequency:  Home BP results range: does not have a log with him and not able to give me an idea of his range SOB? '[]'$  Yes    '[x]'$  No Chest Pain?: '[]'$  Yes    '[x]'$  No Leg swelling?: '[]'$  Yes    '[x]'$  No Headaches?: '[]'$  Yes    '[x]'$  No Dizziness? '[]'$  Yes    '[x]'$  No Comments:   HM:  due for flu shot, Pneumonia vaccine.  He declines both. Had 3 COVID vaccines and does not want any more booster.   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 (Patient taking differently: Take 325 mg by mouth daily with breakfast.) 180 tablet 1   imatinib (GLEEVEC) 100 MG tablet TAKE 4 TABLETS (400 MG) BY MOUTH ONCE DAILY. TAKE WITH MEALS AND A FULL GLASS OF WATER. CAUTION: CHEMOTHERAPY 120  tablet 1   potassium chloride (KLOR-CON) 10 MEQ tablet TAKE 1 TABLET BY MOUTH DAILY. 30 tablet 3   senna-docusate (SENOKOT S) 8.6-50 MG tablet Take 1 tablet by mouth daily. (Patient not taking: Reported on 08/02/2021) 90 tablet 1   No current facility-administered medications on file prior to visit.    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  Tobacco Use   Smoking status: Former    Types: Cigarettes    Quit date: 1998    Years since quitting: 24.7   Smokeless tobacco: Never   Tobacco comments:    01/27/2020 - Pt denied smoking cigarettes  Vaping Use   Vaping Use: Never used  Substance and Sexual Activity   Alcohol use: Not Currently   Drug use: Not Currently   Sexual activity: Not on file  Other Topics Concern   Not on file  Social History Narrative   Not on file   Social Determinants of Health   Financial Resource Strain: Not on file  Food Insecurity: Not on file  Transportation Needs: Not on file  Physical Activity: Not on file  Stress: Not on file  Social Connections: Not on file  Intimate Partner Violence: Not on file    Family History  Family history unknown: Yes    Past Surgical History:  Procedure Laterality Date   BIOPSY  01/24/2020   Procedure: BIOPSY;  Surgeon: Wonda Horner, MD;  Location: [place];  Service: Endoscopy;;   ESOPHAGOGASTRODUODENOSCOPY (EGD) WITH PROPOFOL N/A 01/24/2020   Procedure: ESOPHAGOGASTRODUODENOSCOPY (EGD) WITH PROPOFOL;  Surgeon: Wonda Horner, MD;  Location: [place];  Service: Endoscopy;  Laterality: N/A;   IR CATHETER TUBE CHANGE  02/18/2020   IR RADIOLOGIST EVAL & MGMT  02/15/2020   IR RADIOLOGIST EVAL & MGMT  03/09/2020   SCLEROTHERAPY  01/24/2020   Procedure: SCLEROTHERAPY;   Surgeon: Wonda Horner, MD;  Location: [facility];  Service: Endoscopy;;    ROS: Review of Systems Negative except as stated above  PHYSICAL EXAM: BP (!) 226/107 (BP Location: Left Arm, Patient Position: Sitting, Cuff Size: Normal)   Pulse 71   Resp 16   Wt 159 lb 12.8 oz (72.5 kg)   SpO2 96%   BMI 29.23 kg/m   Physical Exam  Repeat BP 220/100 after being given Clonidine 0.1 mg 15 mins prior  General appearance - alert, well appearing, and in no distress Neck - supple, no significant adenopathy Chest - clear to auscultation, no wheezes, rales or rhonchi, symmetric air entry Heart - normal rate, regular rhythm, normal S1, S2, no murmurs, rubs, clicks or gallops Extremities - peripheral pulses normal, no pedal edema, no clubbing or cyanosis  CMP Latest Ref Rng & Units 06/06/2021 06/06/2021 04/23/2021  Glucose 70 - 99 mg/dL 109(H) - 111(H)  BUN 8 - 23 mg/dL 17 - 13  Creatinine 0.61 - 1.24 mg/dL 0.70 0.88 0.87  Sodium 135 - 145 mmol/L 136 - 138  Potassium 3.5 - 5.1 mmol/L 3.7 - 3.4(L)  Chloride 98 - 111 mmol/L 102 - 103  CO2 22 - 32 mmol/L 26 - 26  Calcium 8.9 - 10.3 mg/dL 9.2 - 8.8(L)  Total Protein 6.5 - 8.1 g/dL 7.6 - 7.4  Total Bilirubin 0.3 - 1.2 mg/dL 0.8 - 0.6  Alkaline Phos 38 - 126 U/L 85 - 82  AST 15 - 41 U/L 19 - 16  ALT 0 - 44 U/L 17 - 10   Lipid Panel  No results found for: CHOL, TRIG, HDL, CHOLHDL, VLDL, LDLCALC, LDLDIRECT  CBC    Component Value Date/Time   WBC 4.3 06/06/2021 1057   WBC 5.5 07/16/2020 1926   RBC 4.74 06/06/2021 1057   HGB 14.6 06/06/2021 1057   HCT 41.9 06/06/2021 1057   PLT 257 06/06/2021 1057   MCV 88.4 06/06/2021 1057   MCH 30.8 06/06/2021 1057   MCHC 34.8 06/06/2021 1057   RDW 12.8 06/06/2021 1057   LYMPHSABS 1.6 06/06/2021 1057   MONOABS 0.4 06/06/2021 1057   EOSABS 0.1 06/06/2021 1057   BASOSABS 0.0 06/06/2021 1057    ASSESSMENT AND PLAN:  1. Essential hypertension Blood pressure significantly elevated today.  He did  not take his blood pressure medications this morning.  Advised him to take his medicines every day as prescribed even if he is going to his doctor's appointment.  Discussed risks of acute cardiovascular events with uncontrolled blood pressure.  When I look at the last time the amlodipine was written, it was in November of last year  with 1 refill which means he has been out of this medication much longer than he indicated.  Refills sent on lisinopril and amlodipine to his pharmacy.  Follow-up with clinical pharmacist in 1 week for repeat blood pressure check. - cloNIDine (CATAPRES) tablet 0.1 mg  2. Influenza vaccination declined   3. Pneumococcal vaccination declined    Patient was given the opportunity to ask questions.  Patient verbalized understanding of the plan and was able to repeat key elements of the plan.  [employer] Language interpreter used during this encounter. [id number], Carlos  No orders of the defined types were placed in this encounter.    Requested Prescriptions   Signed Prescriptions Disp Refills   amLODipine (NORVASC) 10 MG tablet 30 tablet 6    Sig: TAKE 1 TABLET BY MOUTH DAILY.   lisinopril (ZESTRIL) 10 MG tablet 30 tablet 6    Sig: Take 1 tablet (10 mg total) by mouth daily.    Return in about 3 months (around 11/15/2021) for Give appt with [facility] in 1 wk for BP recheck.  Karle Plumber, MD, FACP

## 2021-08-17 ENCOUNTER — Other Ambulatory Visit (HOSPITAL_COMMUNITY): Payer: Self-pay

## 2021-08-21 ENCOUNTER — Other Ambulatory Visit (HOSPITAL_COMMUNITY): Payer: Self-pay

## 2021-08-22 IMAGING — CT CT ABD-PELV W/ CM
2 of 5 series · 16 of 46 positions shown, 18 images · IV contrast (Omni 300)
Comparison: None.

CLINICAL DATA: Loss of appetite

EXAM:
CT ABDOMEN AND PELVIS WITH CONTRAST
TECHNIQUE: Multidetector CT imaging of the abdomen and pelvis was performed
using the standard protocol following bolus administration of
intravenous contrast.
CONTRAST:  100mL OMNIPAQUE IOHEXOL 300 MG/ML  SOLN

[Series 3: a/p w/ 5mm · axial · 0.78mm/px · z∈[+666,+1096]mm · 13 of 98 slices shown, 15 images]
[im 6/98  soft-tissue]
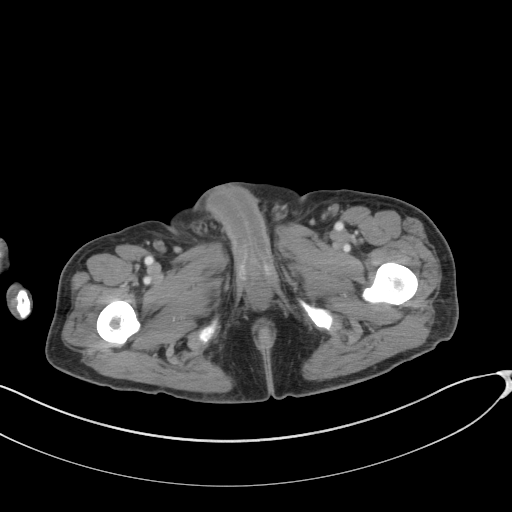
[im 6/98  bone]
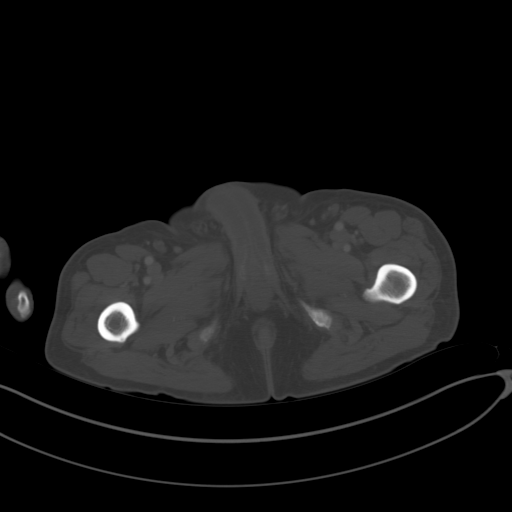
[im 16/98  soft-tissue]
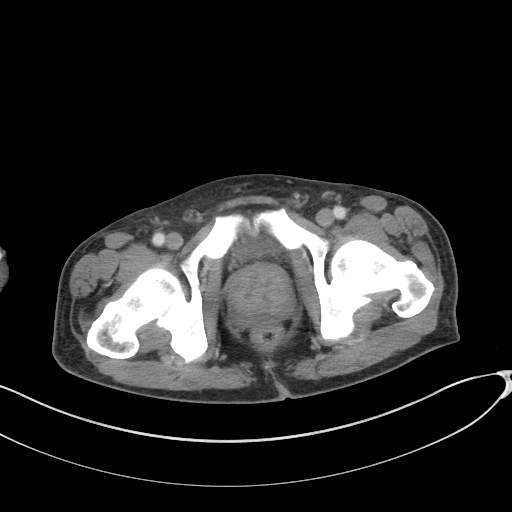
[im 21/98  soft-tissue]
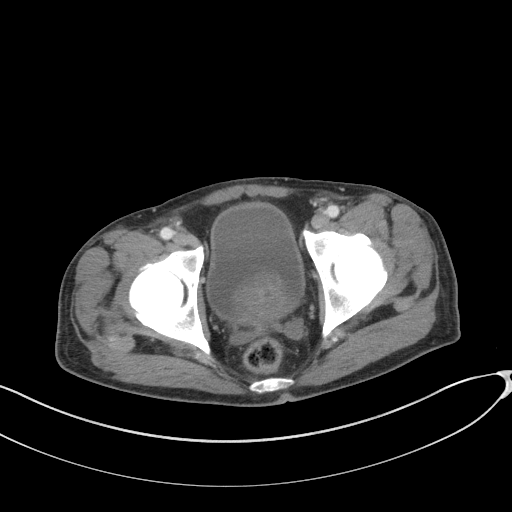
[im 26/98  soft-tissue]
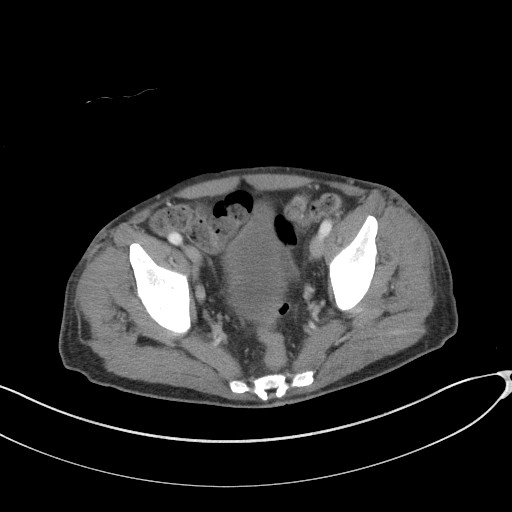
[im 36/98  soft-tissue]
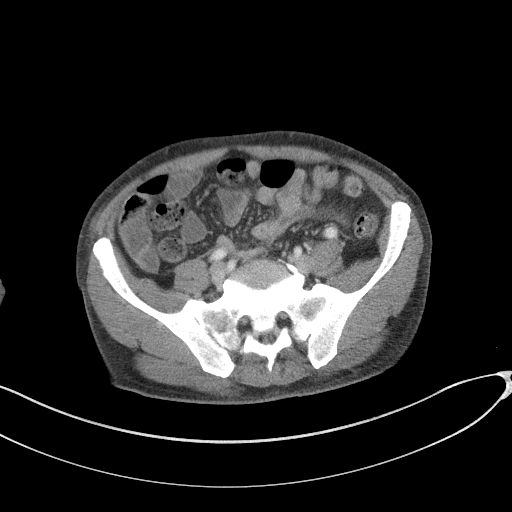
[im 41/98  soft-tissue]
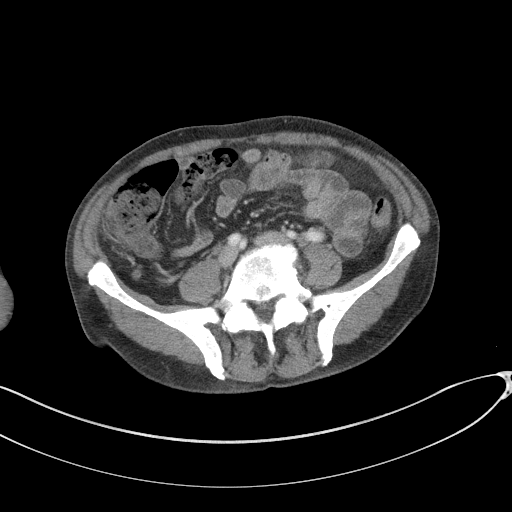
[im 52/98  soft-tissue]
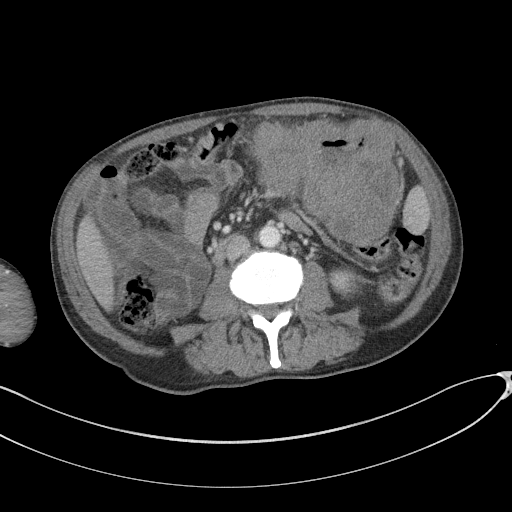
[im 57/98  soft-tissue]
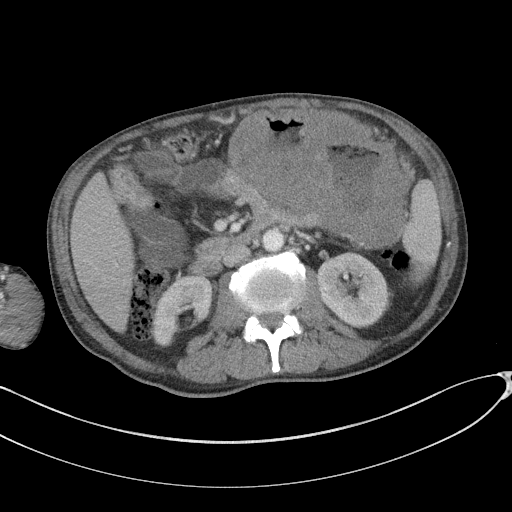
[im 62/98  soft-tissue]
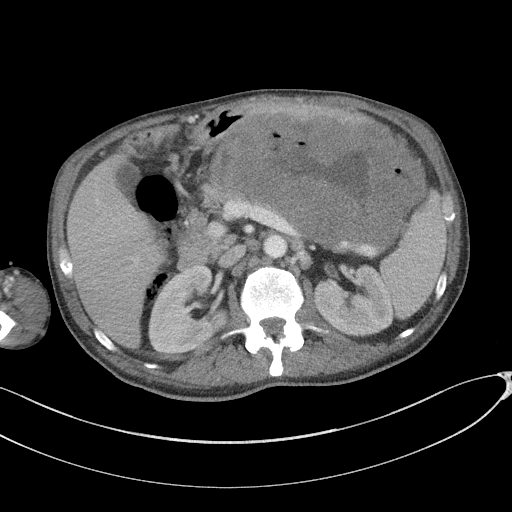
[im 62/98  bone]
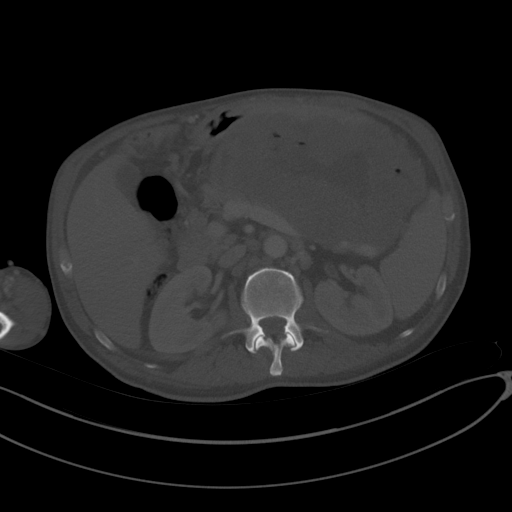
[im 72/98  soft-tissue]
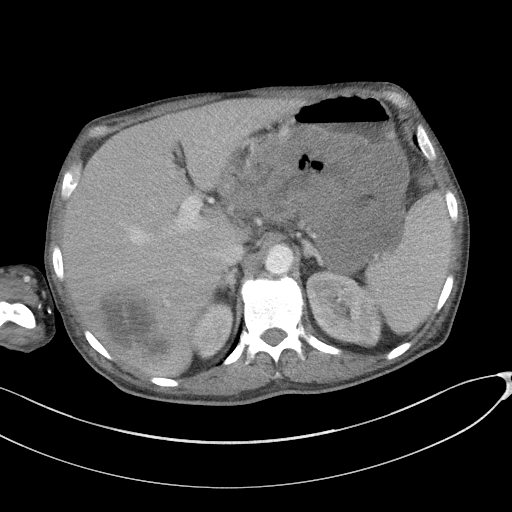
[im 77/98  soft-tissue]
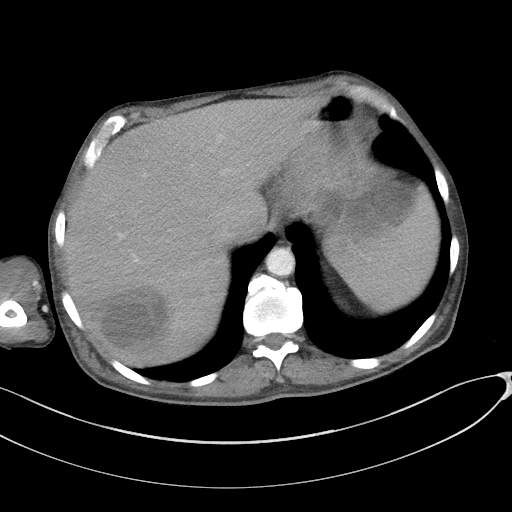
[im 82/98  soft-tissue]
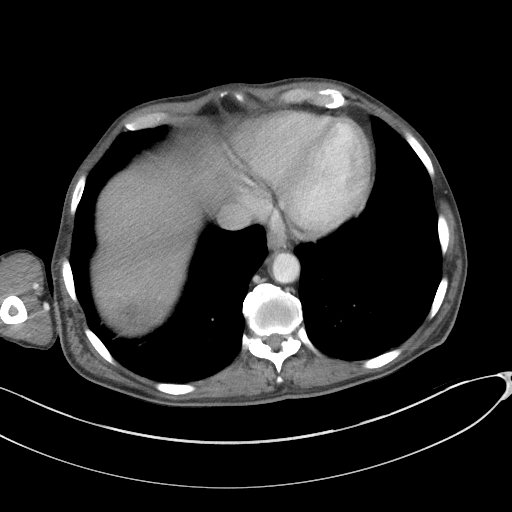
[im 92/98  soft-tissue]
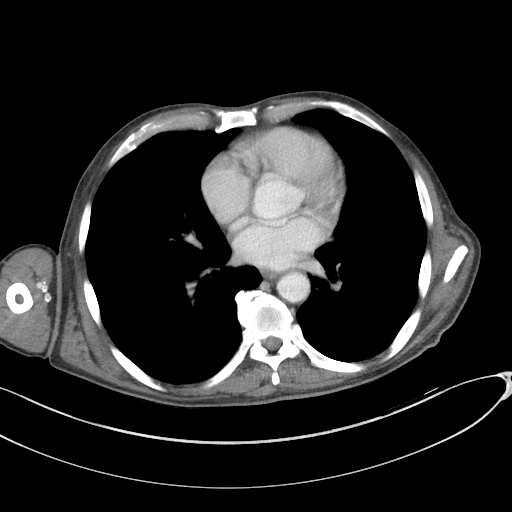

[Series 6: a/p w/ cor · coronal · 0.77mm/px · 3 of 145 slices shown]
[im 65/145  soft-tissue]
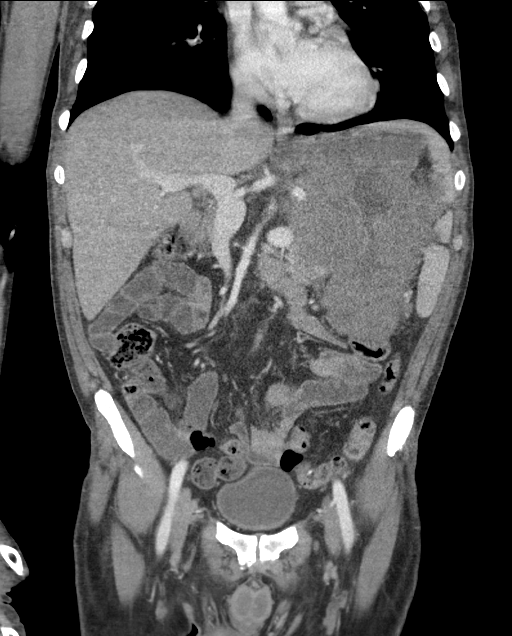
[im 81/145  soft-tissue]
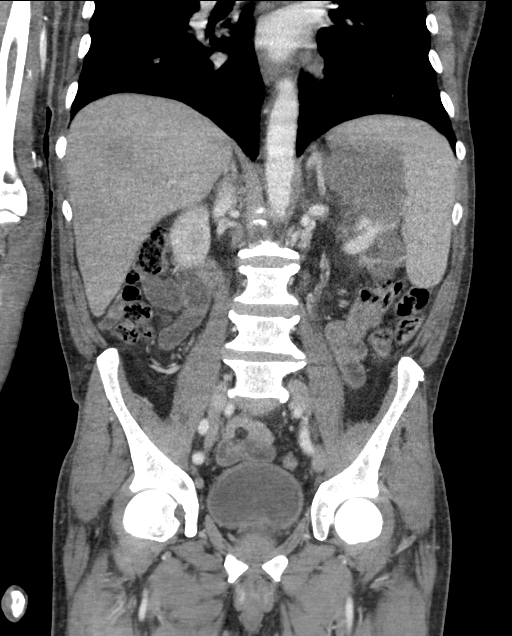
[im 97/145  soft-tissue]
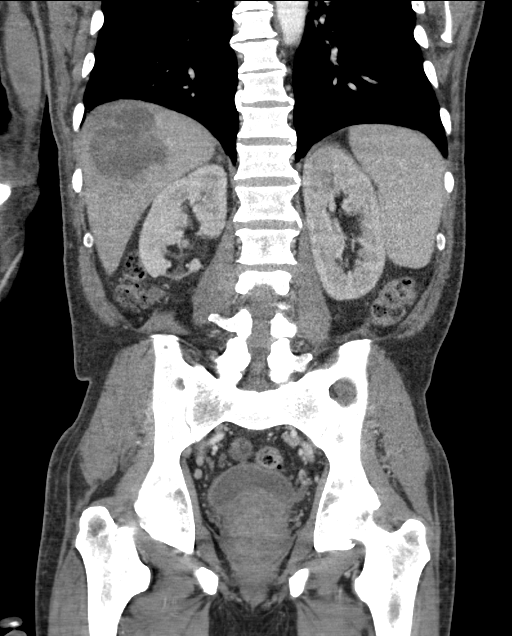

[16 of 46 positions shown; findings below may reference images not displayed]

FINDINGS: Lower chest: No acute pleural or parenchymal lung disease.

Hepatobiliary: Hypodense mass right lobe liver measures up to 5.2 x
6.9 cm coarsened for metastatic disease. Gallbladder is
unremarkable.

Pancreas: Unremarkable. No pancreatic ductal dilatation or
surrounding inflammatory changes.

Spleen: Normal in size without focal abnormality.

Adrenals/Urinary Tract: Left renal cyst. Areas of right renal
cortical scarring are noted. No obstructive uropathy. The adrenals
are normal. Bladder is unremarkable.

Stomach/Bowel: There is a large necrotic mass centered in the
gastric body and antrum, measuring 16.5 by 10 cm in transverse
dimension, and extending approximately 17.3 cm in craniocaudal
length. Findings are consistent with gastric adenocarcinoma. This
mass results and mass effect upon the upper abdominal viscera but I
do not see any direct invasion.

No bowel obstruction.  Normal appendix.

Vascular/Lymphatic: Vascular structures are patent. No critical
stenosis. I do not see any discrete adenopathy within the abdomen or
pelvis.

Reproductive: The prostate is enlarged measuring 5.3 x 5.3 cm in
transverse dimension and extending approximately 6.8 cm in
craniocaudal length. There is mass effect upon the posterior wall
the bladder.

Other: No free fluid.  No free intraperitoneal gas.

Musculoskeletal: There are no acute or destructive bony lesions.
Reconstructed images demonstrate no additional findings.
IMPRESSION: 1. Large necrotic mass centered in the posterior wall of the
stomach, consistent with gastric adenocarcinoma. Endoscopy is
recommended for further evaluation.
2. Irregular hypodense mass right lobe liver, worrisome for
metastatic disease.
3. Enlarged prostate.

## 2021-08-29 ENCOUNTER — Ambulatory Visit: Payer: Medicare Other | Admitting: Pharmacist

## 2021-09-05 ENCOUNTER — Other Ambulatory Visit (HOSPITAL_COMMUNITY): Payer: Self-pay

## 2021-09-05 MED FILL — Imatinib Mesylate Tab 100 MG (Base Equivalent): ORAL | 30 days supply | Qty: 120 | Fill #1 | Status: CN

## 2021-09-06 ENCOUNTER — Other Ambulatory Visit (HOSPITAL_COMMUNITY): Payer: Self-pay

## 2021-09-07 ENCOUNTER — Other Ambulatory Visit (HOSPITAL_COMMUNITY): Payer: Self-pay

## 2021-09-10 ENCOUNTER — Inpatient Hospital Stay: Payer: Medicare Other | Admitting: Nurse Practitioner

## 2021-09-10 ENCOUNTER — Inpatient Hospital Stay: Payer: Medicare Other | Attending: Oncology

## 2021-09-12 ENCOUNTER — Other Ambulatory Visit (HOSPITAL_COMMUNITY): Payer: Self-pay

## 2021-09-19 ENCOUNTER — Other Ambulatory Visit (HOSPITAL_COMMUNITY): Payer: Self-pay

## 2021-09-28 ENCOUNTER — Other Ambulatory Visit (HOSPITAL_COMMUNITY): Payer: Self-pay

## 2021-10-01 ENCOUNTER — Other Ambulatory Visit (HOSPITAL_COMMUNITY): Payer: Self-pay

## 2021-10-03 ENCOUNTER — Other Ambulatory Visit (HOSPITAL_COMMUNITY): Payer: Self-pay

## 2021-10-05 ENCOUNTER — Other Ambulatory Visit (HOSPITAL_COMMUNITY): Payer: Self-pay

## 2021-10-09 ENCOUNTER — Other Ambulatory Visit (HOSPITAL_COMMUNITY): Payer: Self-pay

## 2021-10-22 ENCOUNTER — Other Ambulatory Visit (HOSPITAL_COMMUNITY): Payer: Self-pay

## 2021-10-24 ENCOUNTER — Other Ambulatory Visit (HOSPITAL_COMMUNITY): Payer: Self-pay

## 2021-10-25 ENCOUNTER — Other Ambulatory Visit (HOSPITAL_COMMUNITY): Payer: Self-pay

## 2021-11-20 ENCOUNTER — Ambulatory Visit: Payer: Medicare Other | Admitting: Internal Medicine

## 2021-11-26 ENCOUNTER — Other Ambulatory Visit (HOSPITAL_COMMUNITY): Payer: Self-pay

## 2021-11-28 ENCOUNTER — Telehealth: Payer: Self-pay | Admitting: *Deleted

## 2021-11-28 ENCOUNTER — Other Ambulatory Visit (HOSPITAL_COMMUNITY): Payer: Self-pay

## 2021-11-28 NOTE — Telephone Encounter (Signed)
Call from wife, to report that Mr. Gulbranson refuses to take the [place] reporting it causes nausea and vomiting. She says he has anti-nausea med (nothing on med list), but does not want to take it before the [place]. Last refill of [place] per [place] was 03/13/21. Wife reports he still has #2 large bottles of medication on hand. She does not know when he stopped taking it. She reports he also refused to have the surgery with Dr. Zenia Resides, citing he was afraid he would die on the operating table. Currently is feeling well, eating and denies pain. Informed her he has missed appointments and she agrees to get him in the end of January when daughter returns with the car (she is out of town for 2 weeks). Scheduling message sent.

## 2021-11-29 ENCOUNTER — Encounter: Payer: Self-pay | Admitting: *Deleted

## 2021-11-29 ENCOUNTER — Telehealth: Payer: Self-pay | Admitting: *Deleted

## 2021-11-29 DIAGNOSIS — C49A2 Gastrointestinal stromal tumor of stomach: Secondary | ICD-10-CM

## 2021-11-29 NOTE — Telephone Encounter (Signed)
Lab/CT scan date/time and instructions provided to Mrs. Berwick as well as office visit the following day. Mailed appointments and scan instructions to her home as well after address was confirmed.

## 2021-11-29 NOTE — Progress Notes (Signed)
Per Dr. Benay Spice: he suspects patient is progressing. Needs CT scan abdomen prior to office visit. Orders placed for scan w/lab and office visit.

## 2021-11-29 NOTE — Telephone Encounter (Signed)
Made Kevin [name] aware that Dr. Benay Spice feels his cancer could be progressing, which is why the increase in N/V. She agrees they will have the car week of Jan 9th and can have a scan and see MD 1-2 days later. She will have son pick up the oral contrast before day of scan from [facility] radiology, which is closer to them. Notified managed care for PA.

## 2021-12-19 ENCOUNTER — Ambulatory Visit (HOSPITAL_BASED_OUTPATIENT_CLINIC_OR_DEPARTMENT_OTHER)
Admission: RE | Admit: 2021-12-19 | Discharge: 2021-12-19 | Disposition: A | Discharge status: Home / Self Care | Payer: Medicare Other | Source: Ambulatory Visit | Attending: Oncology | Admitting: Oncology

## 2021-12-19 ENCOUNTER — Inpatient Hospital Stay: Payer: Medicare Other | Attending: Oncology

## 2021-12-19 ENCOUNTER — Encounter (HOSPITAL_BASED_OUTPATIENT_CLINIC_OR_DEPARTMENT_OTHER): Payer: Self-pay

## 2021-12-19 ENCOUNTER — Other Ambulatory Visit: Payer: Self-pay

## 2021-12-19 DIAGNOSIS — C49A2 Gastrointestinal stromal tumor of stomach: Secondary | ICD-10-CM | POA: Insufficient documentation

## 2021-12-19 LAB — CBC WITH DIFFERENTIAL (CANCER CENTER ONLY)
Abs Immature Granulocytes: 0.02 10*3/uL (ref 0.00–0.07)
Basophils Absolute: 0 10*3/uL (ref 0.0–0.1)
Basophils Relative: 0 %
Eosinophils Absolute: 0.2 10*3/uL (ref 0.0–0.5)
Eosinophils Relative: 3 %
HCT: 35.1 % — ABNORMAL LOW (ref 39.0–52.0)
Hemoglobin: 11.1 g/dL — ABNORMAL LOW (ref 13.0–17.0)
Immature Granulocytes: 0 %
Lymphocytes Relative: 24 %
Lymphs Abs: 1.3 10*3/uL (ref 0.7–4.0)
MCH: 27.3 pg (ref 26.0–34.0)
MCHC: 31.6 g/dL (ref 30.0–36.0)
MCV: 86.2 fL (ref 80.0–100.0)
Monocytes Absolute: 0.6 10*3/uL (ref 0.1–1.0)
Monocytes Relative: 10 %
Neutro Abs: 3.5 10*3/uL (ref 1.7–7.7)
Neutrophils Relative %: 63 %
Platelet Count: 307 10*3/uL (ref 150–400)
RBC: 4.07 MIL/uL — ABNORMAL LOW (ref 4.22–5.81)
RDW: 13.5 % (ref 11.5–15.5)
WBC Count: 5.6 10*3/uL (ref 4.0–10.5)
nRBC: 0 % (ref 0.0–0.2)

## 2021-12-19 LAB — CMP (CANCER CENTER ONLY)
ALT: 10 U/L (ref 0–44)
AST: 17 U/L (ref 15–41)
Albumin: 4 g/dL (ref 3.5–5.0)
Alkaline Phosphatase: 73 U/L (ref 38–126)
Anion gap: 10 (ref 5–15)
BUN: 18 mg/dL (ref 8–23)
CO2: 27 mmol/L (ref 22–32)
Calcium: 9.1 mg/dL (ref 8.9–10.3)
Chloride: 100 mmol/L (ref 98–111)
Creatinine: 0.89 mg/dL (ref 0.61–1.24)
GFR, Estimated: >60 mL/min (ref >60)
Glucose, Bld: 112 mg/dL — ABNORMAL HIGH (ref 70–99)
Potassium: 3.7 mmol/L (ref 3.5–5.1)
Sodium: 137 mmol/L (ref 135–145)
Total Bilirubin: 0.5 mg/dL (ref 0.3–1.2)
Total Protein: 8.3 g/dL — ABNORMAL HIGH (ref 6.5–8.1)

## 2021-12-19 MED ORDER — IOHEXOL 300 MG/ML  SOLN
75.0000 mL | Freq: Once | INTRAMUSCULAR | Status: AC | PRN
  Administered 2021-12-19: 75 mL via INTRAVENOUS

## 2021-12-20 ENCOUNTER — Inpatient Hospital Stay: Payer: Medicare Other | Admitting: Oncology

## 2021-12-20 ENCOUNTER — Telehealth: Payer: Self-pay | Admitting: Oncology

## 2021-12-20 NOTE — Telephone Encounter (Signed)
Attempted to contact patient in regards of todays appt, if they will still come in or if they need to reschedule. Left voicemail for Wife to call back.

## 2021-12-24 ENCOUNTER — Telehealth: Payer: Self-pay | Admitting: *Deleted

## 2021-12-24 NOTE — Telephone Encounter (Signed)
Left VM for wife to call office to get [place] scheduled to see Dr.Sherrill or NP due to CT showing disease progression in stomach. Needs to be seen in next 2 weeks. Provided return call number and press option 2 for scheduler.

## 2021-12-24 NOTE — Telephone Encounter (Signed)
-----   Message from Ladell Pier, MD sent at 12/23/2021 11:57 AM EST ----- Please call patient, Kevin [name] is larger, he needs a f/u visit with Benay Spice or Lattie Haw ASAP to discuss treatment options. Please schedule next 2 weeks

## 2021-12-25 ENCOUNTER — Encounter: Payer: Self-pay | Admitting: Nurse Practitioner

## 2021-12-25 ENCOUNTER — Inpatient Hospital Stay (HOSPITAL_BASED_OUTPATIENT_CLINIC_OR_DEPARTMENT_OTHER): Payer: Medicare Other | Admitting: Nurse Practitioner

## 2021-12-25 ENCOUNTER — Other Ambulatory Visit: Payer: Self-pay

## 2021-12-25 VITALS — BP 170/94 | HR 82 | Temp 98.2°F | Resp 18 | Ht 62.0 in | Wt 156.4 lb

## 2021-12-25 DIAGNOSIS — C49A2 Gastrointestinal stromal tumor of stomach: Secondary | ICD-10-CM

## 2021-12-25 NOTE — Progress Notes (Signed)
[place] OFFICE PROGRESS NOTE   Diagnosis: Gastrointestinal stromal tumor  INTERVAL HISTORY:   Kevin [name] returns for follow-up.  He was last seen at the [facility] 06/22/2021.  He did not return for subsequent follow-up.  He did not proceed with surgery.  He reports discontinuing Gleevec in September of last year due to nausea/vomiting.  No rash or diarrhea.  He denies abdominal pain.  He has a good appetite.  His daughter reports he is losing weight.  Objective:  Vital signs in last 24 hours:  Blood pressure (!) 170/94, pulse 82, temperature 98.2 F (36.8 C), temperature source Oral, resp. rate 18, height 5\' 2"  (1.575 m), weight 156 lb 6.4 oz (70.9 kg), SpO2 98 %.    HEENT: No thrush or ulcers. Resp: Lungs clear bilaterally. Cardio: Regular rate and rhythm. GI: Abdomen soft and nontender.  No hepatomegaly.  Palpable mass left abdomen. Vascular: No leg edema.  Lab Results:  Lab Results  Component Value Date   WBC 5.6 12/19/2021   HGB 11.1 (L) 12/19/2021   HCT 35.1 (L) 12/19/2021   MCV 86.2 12/19/2021   PLT 307 12/19/2021   NEUTROABS 3.5 12/19/2021    Imaging:  No results found.  Medications: I have reviewed the patient's current medications.  Assessment/Plan:  1. GIST of the stomach CT abdomen/pelvis 01/21/2020-large necrotic mass centered in the posterior wall of the stomach, irregular hypodense mass right lobe liver  Upper endoscopy 01/24/2020-large infiltrative mass with no bleeding found in the posterior wall of the stomach.  Biopsy-gastrointestinal stromal tumor  MRI liver 01/28/2020-large exophytic centrally necrotic 17.3 x 12.3 cm gastric mass arising from posterior proximal stomach centered in the left upper quadrant with thick enhancing wall.  Complex multilocular 10.7 x 8.2 cm cystic liver mass right liver lobe.   Neoadjuvant Gleevec 01/28/2020 CT 03/03/2020, resolution of fluid component of right hepatic abscess, unchanged mass posterior to  the gastric body, unchanged left periaortic and aortocaval adenopathy CT 06/01/2020-decreased size of a multilobulated mass arising from the posterior stomach, unchanged retroperitoneal lymph nodes, removal of liver drain resolution of abscess CT 09/14/20-no significant change in the large left upper quadrant mass arising from the proximal stomach.  Near complete resolution of right hepatic lobe abscess. CT 06/06/2021-no change in mass arising from the posterior gastric fundus, no lymphadenopathy or evidence of metastatic disease Patient discontinued Gleevec around September 2022 per his report CT 12/19/2021-significant interval increase in size of the large gastric mass which is partially necrotic, measures 16.5 x 14 cm compared to previous measurement of 9 x 7.6 cm.  Lumen of the stomach is significantly compressed.  Also mass-effect on the pancreas, spleen, left kidney and left adrenal gland.  No definite findings for direct invasion of these organs.  No findings of hepatic metastatic disease.  Stable small scattered mesenteric and retroperitoneal lymph nodes. 12/25/2021 Gleevec 400 mg daily 2. Liver lesion in the right lobe             -Liver mass consistent with abscess on MRI of the liver from 01/27/2020             -Status post CT-guided drainage of the liver abscess on 01/28/2020-culture positive for Streptococcus intermedius             -IV antibiotics and discharged home on 10-day course of Augmentin             -CT 03/03/2020-clearance of fluid component of liver abscess, unchanged mass posterior to the gastric body,  stable left periaortic and aortocaval adenopathy             -Abscess drain removed 03/09/2020             -Liver abscess resolved on CT 06/01/2020 and 09/14/2020 3. Iron deficiency anemia-oral iron,  resolved 4. Gastritis and H. pylori infection 5.  History of fever secondary to the liver abscess 6. Protein calorie malnutrition-resolved    Disposition: Kevin [name] has a  gastrointestinal stromal tumor of the stomach.  A Spanish language interpreter is present for today's visit.  There was initial improvement on Gleevec with the plan for surgery.  He declined surgery.  He discontinued Gleevec in September 2022.  Recent CT scans show significant progression of the mass.  Images/results reviewed with Kevin [name] and his daughter.  Dr. Benay Spice recommends resuming Gleevec at a dose of 400 mg daily.  Kevin [name] agrees with this plan.  He will contact the office if he notes nausea.  CBC and chemistry panel reviewed.  Labs adequate to begin [place].  He will return for lab and follow-up in 3 weeks.  He will contact the office in the interim with any problems.  Patient seen with Dr. Benay Spice.    Ned Card ANP/GNP-BC   12/25/2021  1:46 PM This was a shared visit with Ned Card.  Kevin [name] was interviewed and examined.  We reviewed the CT findings and images with him.  A Spanish interpreter and his daughter were present for today's visit.  The gastric mass has enlarged significantly.  He has been off of [place] for at least 4 months.  We strongly encouraged him to resume [place].  We will consider changing to a different tyrosine kinase inhibitor if the tumor does not respond to [place].  I was present for greater than 50% of today's visit.  I performed medical decision making.  Julieanne Manson, MD

## 2021-12-26 ENCOUNTER — Other Ambulatory Visit (HOSPITAL_COMMUNITY): Payer: Self-pay

## 2021-12-26 MED FILL — Imatinib Mesylate Tab 100 MG (Base Equivalent): ORAL | 30 days supply | Qty: 120 | Fill #1 | Status: AC

## 2022-01-08 ENCOUNTER — Other Ambulatory Visit (HOSPITAL_COMMUNITY): Payer: Self-pay

## 2022-01-15 ENCOUNTER — Inpatient Hospital Stay: Payer: Medicare Other

## 2022-01-15 ENCOUNTER — Inpatient Hospital Stay: Payer: Medicare Other | Admitting: Oncology

## 2022-01-15 ENCOUNTER — Other Ambulatory Visit: Payer: Self-pay | Admitting: Oncology

## 2022-01-15 ENCOUNTER — Other Ambulatory Visit (HOSPITAL_COMMUNITY): Payer: Self-pay

## 2022-01-15 MED ORDER — IMATINIB MESYLATE 100 MG PO TABS
ORAL_TABLET | ORAL | 0 refills | Status: DC
  Filled 2022-01-15: qty 120, 30d supply, fill #0

## 2022-01-16 ENCOUNTER — Other Ambulatory Visit (HOSPITAL_COMMUNITY): Payer: Self-pay

## 2022-01-17 ENCOUNTER — Other Ambulatory Visit (HOSPITAL_COMMUNITY): Payer: Self-pay

## 2022-01-21 ENCOUNTER — Other Ambulatory Visit (HOSPITAL_COMMUNITY): Payer: Self-pay

## 2022-01-25 ENCOUNTER — Other Ambulatory Visit: Payer: Self-pay

## 2022-01-25 ENCOUNTER — Other Ambulatory Visit: Payer: Self-pay | Admitting: *Deleted

## 2022-01-25 ENCOUNTER — Encounter: Payer: Self-pay | Admitting: Oncology

## 2022-01-25 ENCOUNTER — Other Ambulatory Visit (HOSPITAL_COMMUNITY): Payer: Self-pay

## 2022-01-25 ENCOUNTER — Inpatient Hospital Stay (HOSPITAL_BASED_OUTPATIENT_CLINIC_OR_DEPARTMENT_OTHER): Payer: Medicare Other | Admitting: Oncology

## 2022-01-25 ENCOUNTER — Inpatient Hospital Stay: Payer: Medicare Other | Attending: Oncology

## 2022-01-25 VITALS — BP 156/89 | HR 83 | Temp 97.7°F | Resp 18 | Ht 62.0 in | Wt 155.2 lb

## 2022-01-25 DIAGNOSIS — C49A2 Gastrointestinal stromal tumor of stomach: Secondary | ICD-10-CM | POA: Insufficient documentation

## 2022-01-25 DIAGNOSIS — I10 Essential (primary) hypertension: Secondary | ICD-10-CM

## 2022-01-25 LAB — CBC WITH DIFFERENTIAL (CANCER CENTER ONLY)
Abs Immature Granulocytes: 0.01 10*3/uL (ref 0.00–0.07)
Basophils Absolute: 0 10*3/uL (ref 0.0–0.1)
Basophils Relative: 0 %
Eosinophils Absolute: 0.2 10*3/uL (ref 0.0–0.5)
Eosinophils Relative: 5 %
HCT: 35.3 % — ABNORMAL LOW (ref 39.0–52.0)
Hemoglobin: 11 g/dL — ABNORMAL LOW (ref 13.0–17.0)
Immature Granulocytes: 0 %
Lymphocytes Relative: 20 %
Lymphs Abs: 0.9 10*3/uL (ref 0.7–4.0)
MCH: 26.8 pg (ref 26.0–34.0)
MCHC: 31.2 g/dL (ref 30.0–36.0)
MCV: 85.9 fL (ref 80.0–100.0)
Monocytes Absolute: 0.6 10*3/uL (ref 0.1–1.0)
Monocytes Relative: 13 %
Neutro Abs: 2.6 10*3/uL (ref 1.7–7.7)
Neutrophils Relative %: 62 %
Platelet Count: 271 10*3/uL (ref 150–400)
RBC: 4.11 MIL/uL — ABNORMAL LOW (ref 4.22–5.81)
RDW: 15.2 % (ref 11.5–15.5)
WBC Count: 4.2 10*3/uL (ref 4.0–10.5)
nRBC: 0 % (ref 0.0–0.2)

## 2022-01-25 LAB — CMP (CANCER CENTER ONLY)
ALT: 12 U/L (ref 0–44)
AST: 17 U/L (ref 15–41)
Albumin: 3.7 g/dL (ref 3.5–5.0)
Alkaline Phosphatase: 78 U/L (ref 38–126)
Anion gap: 7 (ref 5–15)
BUN: 12 mg/dL (ref 8–23)
CO2: 27 mmol/L (ref 22–32)
Calcium: 8.4 mg/dL — ABNORMAL LOW (ref 8.9–10.3)
Chloride: 103 mmol/L (ref 98–111)
Creatinine: 0.99 mg/dL (ref 0.61–1.24)
GFR, Estimated: >60 mL/min (ref >60)
Glucose, Bld: 123 mg/dL — ABNORMAL HIGH (ref 70–99)
Potassium: 3.9 mmol/L (ref 3.5–5.1)
Sodium: 137 mmol/L (ref 135–145)
Total Bilirubin: 0.6 mg/dL (ref 0.3–1.2)
Total Protein: 7.4 g/dL (ref 6.5–8.1)

## 2022-01-25 MED ORDER — LISINOPRIL 10 MG PO TABS
10.0000 mg | ORAL_TABLET | Freq: Every day | ORAL | 0 refills | Status: DC
  Filled 2022-01-25: qty 30, 30d supply, fill #0

## 2022-01-25 MED ORDER — LISINOPRIL 10 MG PO TABS: 10.0000 mg | ORAL_TABLET | Freq: Every day | ORAL | 6 refills | Status: DC

## 2022-01-25 MED ORDER — AMLODIPINE BESYLATE 10 MG PO TABS
10.0000 mg | ORAL_TABLET | Freq: Every day | ORAL | 0 refills | Status: DC
  Filled 2022-01-25: qty 30, 30d supply, fill #0

## 2022-01-25 MED ORDER — AMLODIPINE BESYLATE 10 MG PO TABS: ORAL_TABLET | Freq: Every day | ORAL | 6 refills | Status: DC

## 2022-01-25 NOTE — Progress Notes (Signed)
[place] OFFICE PROGRESS NOTE   Diagnosis: Gastrointestinal stromal tumor  INTERVAL HISTORY:   Kevin [name] returns as scheduled.  He is here today with his daughter.  A Spanish interpreter was present for today's visit.  Kevin [name] is taking Gleevec daily.  No nausea, diarrhea, rash, or bleeding.  He reports a good appetite.  He has developed a cold characterized by rhinorrhea.  No fever or dyspnea.  Objective:  Vital signs in last 24 hours:  Blood pressure (!) 156/89, pulse 83, temperature 97.7 F (36.5 C), temperature source Oral, resp. rate 18, height 5\' 2"  (1.575 m), weight 155 lb 3.2 oz (70.4 kg), SpO2 97 %.    HEENT: Mild white coat over the tongue, no buccal thrush Resp: Lungs clear bilaterally, no respiratory distress Cardio: Regular rate and rhythm GI: There is a mass in the left upper abdomen, no hepatomegaly, nontender Vascular: No leg edema  Skin: No rash Lab Results:  Lab Results  Component Value Date   WBC 4.2 01/25/2022   HGB 11.0 (L) 01/25/2022   HCT 35.3 (L) 01/25/2022   MCV 85.9 01/25/2022   PLT 271 01/25/2022   NEUTROABS 2.6 01/25/2022    CMP  Lab Results  Component Value Date   NA 137 12/19/2021   K 3.7 12/19/2021   CL 100 12/19/2021   CO2 27 12/19/2021   GLUCOSE 112 (H) 12/19/2021   BUN 18 12/19/2021   CREATININE 0.89 12/19/2021   CALCIUM 9.1 12/19/2021   PROT 8.3 (H) 12/19/2021   ALBUMIN 4.0 12/19/2021   AST 17 12/19/2021   ALT 10 12/19/2021   ALKPHOS 73 12/19/2021   BILITOT 0.5 12/19/2021   GFRNONAA >60 12/19/2021   GFRAA >60 09/01/2020    Medications: I have reviewed the patient's current medications.   Assessment/Plan: GIST of the stomach CT abdomen/pelvis 01/21/2020-large necrotic mass centered in the posterior wall of the stomach, irregular hypodense mass right lobe liver  Upper endoscopy 01/24/2020-large infiltrative mass with no bleeding found in the posterior wall of the stomach.  Biopsy-gastrointestinal  stromal tumor  MRI liver 01/28/2020-large exophytic centrally necrotic 17.3 x 12.3 cm gastric mass arising from posterior proximal stomach centered in the left upper quadrant with thick enhancing wall.  Complex multilocular 10.7 x 8.2 cm cystic liver mass right liver lobe.   Neoadjuvant Gleevec 01/28/2020 CT 03/03/2020, resolution of fluid component of right hepatic abscess, unchanged mass posterior to the gastric body, unchanged left periaortic and aortocaval adenopathy CT 06/01/2020-decreased size of a multilobulated mass arising from the posterior stomach, unchanged retroperitoneal lymph nodes, removal of liver drain resolution of abscess CT 09/14/20-no significant change in the large left upper quadrant mass arising from the proximal stomach.  Near complete resolution of right hepatic lobe abscess. CT 06/06/2021-no change in mass arising from the posterior gastric fundus, no lymphadenopathy or evidence of metastatic disease Patient discontinued Gleevec around September 2022 per his report CT 12/19/2021-significant interval increase in size of the large gastric mass which is partially necrotic, measures 16.5 x 14 cm compared to previous measurement of 9 x 7.6 cm.  Lumen of the stomach is significantly compressed.  Also mass-effect on the pancreas, spleen, left kidney and left adrenal gland.  No definite findings for direct invasion of these organs.  No findings of hepatic metastatic disease.  Stable small scattered mesenteric and retroperitoneal lymph nodes. 12/25/2021 Gleevec 400 mg daily 2. Liver lesion in the right lobe             -Liver  mass consistent with abscess on MRI of the liver from 01/27/2020             -Status post CT-guided drainage of the liver abscess on 01/28/2020-culture positive for Streptococcus intermedius             -IV antibiotics and discharged home on 10-day course of Augmentin             -CT 03/03/2020-clearance of fluid component of liver abscess, unchanged mass posterior to the  gastric body, stable left periaortic and aortocaval adenopathy             -Abscess drain removed 03/09/2020             -Liver abscess resolved on CT 06/01/2020 and 09/14/2020 3. Iron deficiency anemia-oral iron,  resolved 4. Gastritis and H. pylori infection 5.  History of fever secondary to the liver abscess 6. Protein calorie malnutrition-resolved      Disposition: Kevin [name] resumed [place] last month.  He appears to be tolerating the [place] well.  He will continue Gleevec.  We refilled [place] and his blood pressure medications today.  He will return for an office visit in 1 month.  Betsy Coder, MD  01/25/2022  8:46 AM

## 2022-01-25 NOTE — Progress Notes (Signed)
With assistance of interpreter [phone] on AMN device spoke with [place] and payment arrangements made for his [place]. He can pick up medication today.

## 2022-01-25 NOTE — Progress Notes (Signed)
Per Dr. Benay Spice: Only give 1 refill on BP meds today. Needs to f/u with PCP.  [place] and confirmed that Dr. Karle Plumber is still his PCP and he is pas due f/u there. Called patient's wife and left VM with phone [phone] to get an appointment for him to see PCP. Dr. Benay Spice will not manage his BP long term.

## 2022-01-26 ENCOUNTER — Other Ambulatory Visit (HOSPITAL_COMMUNITY): Payer: Self-pay

## 2022-01-29 ENCOUNTER — Other Ambulatory Visit (HOSPITAL_COMMUNITY): Payer: Self-pay

## 2022-02-11 ENCOUNTER — Other Ambulatory Visit (HOSPITAL_COMMUNITY): Payer: Self-pay

## 2022-02-13 ENCOUNTER — Other Ambulatory Visit: Payer: Self-pay | Admitting: Oncology

## 2022-02-13 ENCOUNTER — Other Ambulatory Visit (HOSPITAL_COMMUNITY): Payer: Self-pay

## 2022-02-13 MED ORDER — IMATINIB MESYLATE 100 MG PO TABS
ORAL_TABLET | ORAL | 0 refills | Status: DC
  Filled 2022-02-13: qty 120, 30d supply, fill #0

## 2022-02-15 ENCOUNTER — Inpatient Hospital Stay: Payer: Medicare Other | Attending: Oncology

## 2022-02-15 ENCOUNTER — Encounter: Payer: Self-pay | Admitting: Nurse Practitioner

## 2022-02-15 ENCOUNTER — Other Ambulatory Visit: Payer: Self-pay

## 2022-02-15 ENCOUNTER — Inpatient Hospital Stay (HOSPITAL_BASED_OUTPATIENT_CLINIC_OR_DEPARTMENT_OTHER): Payer: Medicare Other | Admitting: Nurse Practitioner

## 2022-02-15 VITALS — BP 177/90 | HR 71 | Temp 97.8°F | Resp 20 | Ht 62.0 in | Wt 157.2 lb

## 2022-02-15 DIAGNOSIS — C49A2 Gastrointestinal stromal tumor of stomach: Secondary | ICD-10-CM

## 2022-02-15 LAB — CBC WITH DIFFERENTIAL (CANCER CENTER ONLY)
Abs Immature Granulocytes: 0.01 10*3/uL (ref 0.00–0.07)
Basophils Absolute: 0 10*3/uL (ref 0.0–0.1)
Basophils Relative: 0 %
Eosinophils Absolute: 0.4 10*3/uL (ref 0.0–0.5)
Eosinophils Relative: 10 %
HCT: 38 % — ABNORMAL LOW (ref 39.0–52.0)
Hemoglobin: 11.9 g/dL — ABNORMAL LOW (ref 13.0–17.0)
Immature Granulocytes: 0 %
Lymphocytes Relative: 26 %
Lymphs Abs: 1.1 10*3/uL (ref 0.7–4.0)
MCH: 26.8 pg (ref 26.0–34.0)
MCHC: 31.3 g/dL (ref 30.0–36.0)
MCV: 85.6 fL (ref 80.0–100.0)
Monocytes Absolute: 0.3 10*3/uL (ref 0.1–1.0)
Monocytes Relative: 8 %
Neutro Abs: 2.4 10*3/uL (ref 1.7–7.7)
Neutrophils Relative %: 56 %
Platelet Count: 245 10*3/uL (ref 150–400)
RBC: 4.44 MIL/uL (ref 4.22–5.81)
RDW: 15.9 % — ABNORMAL HIGH (ref 11.5–15.5)
WBC Count: 4.3 10*3/uL (ref 4.0–10.5)
nRBC: 0 % (ref 0.0–0.2)

## 2022-02-15 LAB — CMP (CANCER CENTER ONLY)
ALT: 13 U/L (ref 0–44)
AST: 18 U/L (ref 15–41)
Albumin: 3.9 g/dL (ref 3.5–5.0)
Alkaline Phosphatase: 80 U/L (ref 38–126)
Anion gap: 9 (ref 5–15)
BUN: 17 mg/dL (ref 8–23)
CO2: 25 mmol/L (ref 22–32)
Calcium: 8.6 mg/dL — ABNORMAL LOW (ref 8.9–10.3)
Chloride: 104 mmol/L (ref 98–111)
Creatinine: 0.96 mg/dL (ref 0.61–1.24)
GFR, Estimated: >60 mL/min (ref >60)
Glucose, Bld: 104 mg/dL — ABNORMAL HIGH (ref 70–99)
Potassium: 3.7 mmol/L (ref 3.5–5.1)
Sodium: 138 mmol/L (ref 135–145)
Total Bilirubin: 0.5 mg/dL (ref 0.3–1.2)
Total Protein: 7.9 g/dL (ref 6.5–8.1)

## 2022-02-15 NOTE — Progress Notes (Signed)
?[place] ?OFFICE PROGRESS NOTE ? ? ?Diagnosis: Gastrointestinal stromal tumor ? ?INTERVAL HISTORY:  ? ?Mr. Balestrieri returns as scheduled.  He feels well.  He continues [place].  No abdominal pain.  No nausea or vomiting.  No mouth sores.  No diarrhea.  No rash.  He has a good appetite and good energy level. ? ?Objective: ? ?Vital signs in last 24 hours: ? ?Blood pressure (!) 177/90, pulse 71, temperature 97.8 ?F (36.6 ?C), resp. rate 20, height '5\' 2"'$  (1.575 m), weight 157 lb 3.2 oz (71.3 kg), SpO2 100 %. ?  ? ?HEENT: No thrush or ulcers. ?Resp: Lungs clear bilaterally. ?Cardio: Regular rate and rhythm. ?GI: No hepatomegaly.  Mass in the left upper abdomen, seems smaller/less discrete.  Nontender. ?Vascular: No leg edema. ?Skin: No rash. ? ? ?Lab Results: ? ?Lab Results  ?Component Value Date  ? WBC 4.3 02/15/2022  ? HGB 11.9 (L) 02/15/2022  ? HCT 38.0 (L) 02/15/2022  ? MCV 85.6 02/15/2022  ? PLT 245 02/15/2022  ? NEUTROABS 2.4 02/15/2022  ? ? ?Imaging: ? ?No results found. ? ?Medications: I have reviewed the patient's current medications. ? ?Assessment/Plan: ?GIST of the stomach ?CT abdomen/pelvis 01/21/2020-large necrotic mass centered in the posterior wall of the stomach, irregular hypodense mass right lobe liver  ?Upper endoscopy 01/24/2020-large infiltrative mass with no bleeding found in the posterior wall of the stomach.  Biopsy-gastrointestinal stromal tumor  ?MRI liver 01/28/2020-large exophytic centrally necrotic 17.3 x 12.3 cm gastric mass arising from posterior proximal stomach centered in the left upper quadrant with thick enhancing wall.  Complex multilocular 10.7 x 8.2 cm cystic liver mass right liver lobe.   ?Neoadjuvant Gleevec 01/28/2020 ?CT 03/03/2020, resolution of fluid component of right hepatic abscess, unchanged mass posterior to the gastric body, unchanged left periaortic and aortocaval adenopathy ?CT 06/01/2020-decreased size of a multilobulated mass arising from the posterior  stomach, unchanged retroperitoneal lymph nodes, removal of liver drain resolution of abscess ?CT 09/14/20-no significant change in the large left upper quadrant mass arising from the proximal stomach.  Near complete resolution of right hepatic lobe abscess. ?CT 06/06/2021-no change in mass arising from the posterior gastric fundus, no lymphadenopathy or evidence of metastatic disease ?Patient discontinued Gleevec around September 2022 per his report ?CT 12/19/2021-significant interval increase in size of the large gastric mass which is partially necrotic, measures 16.5 x 14 cm compared to previous measurement of 9 x 7.6 cm.  Lumen of the stomach is significantly compressed.  Also mass-effect on the pancreas, spleen, left kidney and left adrenal gland.  No definite findings for direct invasion of these organs.  No findings of hepatic metastatic disease.  Stable small scattered mesenteric and retroperitoneal lymph nodes. ?12/25/2021 Gleevec 400 mg daily ?2. Liver lesion in the right lobe ?            -Liver mass consistent with abscess on MRI of the liver from 01/27/2020 ?            -Status post CT-guided drainage of the liver abscess on 01/28/2020-culture positive for Streptococcus intermedius ?            -IV antibiotics and discharged home on 10-day course of Augmentin ?            -CT 03/03/2020-clearance of fluid component of liver abscess, unchanged mass posterior to the gastric body, stable left periaortic and aortocaval adenopathy ?            -Abscess drain removed 03/09/2020 ?            -  Liver abscess resolved on CT 06/01/2020 and 09/14/2020 ?3. Iron deficiency anemia-oral iron,  resolved ?4. Gastritis and H. pylori infection ?5.  History of fever secondary to the liver abscess ?6. Protein calorie malnutrition-resolved ?  ? ?Disposition: Mr. Cedillos appears improved.  He is tolerating Gleevec well.  He will continue current dose of 400 mg daily.  By exam the mass is smaller.  He will have a restaging CT  abdomen/pelvis prior to the next office visit. ? ?CBC from today reviewed.  Labs adequate to continue [place]. ? ?Plan for CTs in approximately 1 month, follow-up appointment a few days later.  We are available to see him sooner if needed. ? ?A Spanish interpreter was present throughout today's visit. ? ? ?Ned Card ANP/GNP-BC  ? ?02/15/2022  ?10:40 AM ? ? ? ? ? ? ? ?

## 2022-02-15 NOTE — Progress Notes (Signed)
Kevin [name] with [facility] interpreter services present for visit with patients.  ?

## 2022-02-19 ENCOUNTER — Other Ambulatory Visit (HOSPITAL_COMMUNITY): Payer: Self-pay

## 2022-02-25 ENCOUNTER — Other Ambulatory Visit (HOSPITAL_COMMUNITY): Payer: Self-pay

## 2022-02-26 ENCOUNTER — Telehealth: Payer: Self-pay | Admitting: *Deleted

## 2022-02-26 ENCOUNTER — Other Ambulatory Visit (HOSPITAL_COMMUNITY): Payer: Self-pay

## 2022-02-26 NOTE — Telephone Encounter (Signed)
Notified Mrs. Mane of CT at [facility] on 03/12/22 at 1130 with 1100 arrival. NPO 4 hours and oral contrast at 0930 and 1030. Confirmed he has the contrast at home. ?

## 2022-03-06 ENCOUNTER — Other Ambulatory Visit (HOSPITAL_COMMUNITY): Payer: Self-pay

## 2022-03-12 ENCOUNTER — Other Ambulatory Visit (HOSPITAL_COMMUNITY): Payer: Self-pay

## 2022-03-12 ENCOUNTER — Ambulatory Visit (HOSPITAL_BASED_OUTPATIENT_CLINIC_OR_DEPARTMENT_OTHER): Admission: RE | Admit: 2022-03-12 | Payer: Medicare Other | Source: Ambulatory Visit

## 2022-03-14 ENCOUNTER — Other Ambulatory Visit (HOSPITAL_COMMUNITY): Payer: Self-pay

## 2022-03-14 ENCOUNTER — Other Ambulatory Visit: Payer: Self-pay | Admitting: *Deleted

## 2022-03-14 DIAGNOSIS — C49A2 Gastrointestinal stromal tumor of stomach: Secondary | ICD-10-CM

## 2022-03-15 ENCOUNTER — Encounter (HOSPITAL_BASED_OUTPATIENT_CLINIC_OR_DEPARTMENT_OTHER): Payer: Self-pay

## 2022-03-15 ENCOUNTER — Inpatient Hospital Stay (HOSPITAL_BASED_OUTPATIENT_CLINIC_OR_DEPARTMENT_OTHER): Payer: Medicare Other | Admitting: Oncology

## 2022-03-15 ENCOUNTER — Other Ambulatory Visit (HOSPITAL_COMMUNITY): Payer: Self-pay

## 2022-03-15 ENCOUNTER — Inpatient Hospital Stay: Payer: Medicare Other | Attending: Oncology

## 2022-03-15 ENCOUNTER — Other Ambulatory Visit: Payer: Self-pay | Admitting: Oncology

## 2022-03-15 ENCOUNTER — Ambulatory Visit (HOSPITAL_BASED_OUTPATIENT_CLINIC_OR_DEPARTMENT_OTHER)
Admission: RE | Admit: 2022-03-15 | Discharge: 2022-03-15 | Disposition: A | Discharge status: Home / Self Care | Payer: Medicare Other | Source: Ambulatory Visit | Attending: Nurse Practitioner | Admitting: Nurse Practitioner

## 2022-03-15 VITALS — BP 169/95 | HR 70 | Temp 98.7°F | Resp 20 | Wt 157.6 lb

## 2022-03-15 DIAGNOSIS — C49A2 Gastrointestinal stromal tumor of stomach: Secondary | ICD-10-CM

## 2022-03-15 LAB — CMP (CANCER CENTER ONLY)
ALT: 10 U/L (ref 0–44)
AST: 14 U/L — ABNORMAL LOW (ref 15–41)
Albumin: 3.9 g/dL (ref 3.5–5.0)
Alkaline Phosphatase: 73 U/L (ref 38–126)
Anion gap: 8 (ref 5–15)
BUN: 21 mg/dL (ref 8–23)
CO2: 26 mmol/L (ref 22–32)
Calcium: 9 mg/dL (ref 8.9–10.3)
Chloride: 104 mmol/L (ref 98–111)
Creatinine: 0.87 mg/dL (ref 0.61–1.24)
GFR, Estimated: >60 mL/min (ref >60)
Glucose, Bld: 105 mg/dL — ABNORMAL HIGH (ref 70–99)
Potassium: 3.7 mmol/L (ref 3.5–5.1)
Sodium: 138 mmol/L (ref 135–145)
Total Bilirubin: 0.4 mg/dL (ref 0.3–1.2)
Total Protein: 8.1 g/dL (ref 6.5–8.1)

## 2022-03-15 LAB — CBC WITH DIFFERENTIAL (CANCER CENTER ONLY)
Abs Immature Granulocytes: 0 10*3/uL (ref 0.00–0.07)
Basophils Absolute: 0 10*3/uL (ref 0.0–0.1)
Basophils Relative: 0 %
Eosinophils Absolute: 0.1 10*3/uL (ref 0.0–0.5)
Eosinophils Relative: 2 %
HCT: 35.5 % — ABNORMAL LOW (ref 39.0–52.0)
Hemoglobin: 11.1 g/dL — ABNORMAL LOW (ref 13.0–17.0)
Immature Granulocytes: 0 %
Lymphocytes Relative: 28 %
Lymphs Abs: 1.4 10*3/uL (ref 0.7–4.0)
MCH: 26.6 pg (ref 26.0–34.0)
MCHC: 31.3 g/dL (ref 30.0–36.0)
MCV: 84.9 fL (ref 80.0–100.0)
Monocytes Absolute: 0.4 10*3/uL (ref 0.1–1.0)
Monocytes Relative: 9 %
Neutro Abs: 3.1 10*3/uL (ref 1.7–7.7)
Neutrophils Relative %: 61 %
Platelet Count: 343 10*3/uL (ref 150–400)
RBC: 4.18 MIL/uL — ABNORMAL LOW (ref 4.22–5.81)
RDW: 15.9 % — ABNORMAL HIGH (ref 11.5–15.5)
WBC Count: 5.1 10*3/uL (ref 4.0–10.5)
nRBC: 0 % (ref 0.0–0.2)

## 2022-03-15 MED ORDER — IOHEXOL 300 MG/ML  SOLN
100.0000 mL | Freq: Once | INTRAMUSCULAR | Status: AC | PRN
  Administered 2022-03-15: 100 mL via INTRAVENOUS

## 2022-03-15 MED FILL — Imatinib Mesylate Tab 100 MG (Base Equivalent): ORAL | 30 days supply | Qty: 120 | Fill #0 | Status: AC

## 2022-03-15 NOTE — Progress Notes (Signed)
?[place] ?OFFICE PROGRESS NOTE ? ? ?Diagnosis: Gastrointestinal stromal tumor ? ?INTERVAL HISTORY:  ? ?Kevin [name] returns as scheduled.  He is seen today with a Spanish interpreter.  He continues [place].  No nausea, abdominal pain, bleeding, rash, or diarrhea.  No complaint. ? ?Objective: ? ?Vital signs in last 24 hours: ? ?Blood pressure (!) 169/95, pulse 70, temperature 98.7 ?F (37.1 ?C), temperature source Oral, resp. rate 20, weight 157 lb 9.6 oz (71.5 kg), SpO2 100 %. ?  ? ?HEENT: No thrush or ulcers ?Resp: Lungs clear bilaterally ?Cardio: Regular rate and rhythm ?GI: No hepatomegaly, firm fullness in the mid left subcostal region. ?Vascular: No leg edema ?  ? ? ?Lab Results: ? ?Lab Results  ?Component Value Date  ? WBC 5.1 03/15/2022  ? HGB 11.1 (L) 03/15/2022  ? HCT 35.5 (L) 03/15/2022  ? MCV 84.9 03/15/2022  ? PLT 343 03/15/2022  ? NEUTROABS 3.1 03/15/2022  ? ? ?CMP  ?Lab Results  ?Component Value Date  ? NA 138 03/15/2022  ? K 3.7 03/15/2022  ? CL 104 03/15/2022  ? CO2 26 03/15/2022  ? GLUCOSE 105 (H) 03/15/2022  ? BUN 21 03/15/2022  ? CREATININE 0.87 03/15/2022  ? CALCIUM 9.0 03/15/2022  ? PROT 8.1 03/15/2022  ? ALBUMIN 3.9 03/15/2022  ? AST 14 (L) 03/15/2022  ? ALT 10 03/15/2022  ? ALKPHOS 73 03/15/2022  ? BILITOT 0.4 03/15/2022  ? GFRNONAA >60 03/15/2022  ? GFRAA >60 09/01/2020  ? ? ?No results found for: CEA1, CEA, [id number], CA125 ? ?Lab Results  ?Component Value Date  ? INR 1.4 (H) 01/28/2020  ? LABPROT 17.2 (H) 01/28/2020  ? ? ?Imaging: ? ?No results found. ? ?Medications: I have reviewed the patient's current medications. ? ? ?Assessment/Plan: ?GIST of the stomach ?CT abdomen/pelvis 01/21/2020-large necrotic mass centered in the posterior wall of the stomach, irregular hypodense mass right lobe liver  ?Upper endoscopy 01/24/2020-large infiltrative mass with no bleeding found in the posterior wall of the stomach.  Biopsy-gastrointestinal stromal tumor  ?MRI liver 01/28/2020-large  exophytic centrally necrotic 17.3 x 12.3 cm gastric mass arising from posterior proximal stomach centered in the left upper quadrant with thick enhancing wall.  Complex multilocular 10.7 x 8.2 cm cystic liver mass right liver lobe.   ?Neoadjuvant Gleevec 01/28/2020 ?CT 03/03/2020, resolution of fluid component of right hepatic abscess, unchanged mass posterior to the gastric body, unchanged left periaortic and aortocaval adenopathy ?CT 06/01/2020-decreased size of a multilobulated mass arising from the posterior stomach, unchanged retroperitoneal lymph nodes, removal of liver drain resolution of abscess ?CT 09/14/20-no significant change in the large left upper quadrant mass arising from the proximal stomach.  Near complete resolution of right hepatic lobe abscess. ?CT 06/06/2021-no change in mass arising from the posterior gastric fundus, no lymphadenopathy or evidence of metastatic disease ?Patient discontinued Gleevec around September 2022 per his report ?CT 12/19/2021-significant interval increase in size of the large gastric mass which is partially necrotic, measures 16.5 x 14 cm compared to previous measurement of 9 x 7.6 cm.  Lumen of the stomach is significantly compressed.  Also mass-effect on the pancreas, spleen, left kidney and left adrenal gland.  No definite findings for direct invasion of these organs.  No findings of hepatic metastatic disease.  Stable small scattered mesenteric and retroperitoneal lymph nodes. ?12/25/2021 Gleevec 400 mg daily ?2. Liver lesion in the right lobe ?            -Liver mass consistent with abscess on  MRI of the liver from 01/27/2020 ?            -Status post CT-guided drainage of the liver abscess on 01/28/2020-culture positive for Streptococcus intermedius ?            -IV antibiotics and discharged home on 10-day course of Augmentin ?            -CT 03/03/2020-clearance of fluid component of liver abscess, unchanged mass posterior to the gastric body, stable left periaortic and  aortocaval adenopathy ?            -Abscess drain removed 03/09/2020 ?            -Liver abscess resolved on CT 06/01/2020 and 09/14/2020 ?3. Iron deficiency anemia-oral iron,  resolved ?4. Gastritis and H. pylori infection ?5.  History of fever secondary to the liver abscess ?6. Protein calorie malnutrition-resolved ?  ? ? ?Disposition: ?[name] appears stable.  He is tolerating the [place] well.  He will continue Gleevec daily.  He will undergo a restaging CT later today.  We will contact him with the CT result. ?[name] will return for office visit in 5 weeks. ? ?Betsy Coder, MD ? ?03/15/2022  ?12:21 PM ? ? ?

## 2022-03-18 ENCOUNTER — Telehealth: Payer: Self-pay | Admitting: *Deleted

## 2022-03-18 NOTE — Telephone Encounter (Signed)
-----   Message from Ladell Pier, MD sent at 03/16/2022  6:33 PM EDT ----- ?Please call daughter, the Gastric tumor is significantly smaller, continue gleevec, f/u as scheduled ? ?

## 2022-03-18 NOTE — Telephone Encounter (Signed)
Unable to reach daughter, so called Mrs. Lansdowne and informed her that gastric tumor is much smaller. Have Marcellas continue the [place] and follow up as scheduled. ?

## 2022-03-19 ENCOUNTER — Other Ambulatory Visit (HOSPITAL_COMMUNITY): Payer: Self-pay

## 2022-04-09 ENCOUNTER — Other Ambulatory Visit (HOSPITAL_COMMUNITY): Payer: Self-pay

## 2022-04-11 ENCOUNTER — Other Ambulatory Visit (HOSPITAL_COMMUNITY): Payer: Self-pay

## 2022-04-15 ENCOUNTER — Other Ambulatory Visit: Payer: Self-pay

## 2022-04-15 ENCOUNTER — Other Ambulatory Visit (HOSPITAL_COMMUNITY): Payer: Self-pay

## 2022-04-15 ENCOUNTER — Encounter (HOSPITAL_COMMUNITY): Payer: Self-pay

## 2022-04-15 ENCOUNTER — Emergency Department (HOSPITAL_COMMUNITY): Payer: Medicare Other

## 2022-04-15 ENCOUNTER — Inpatient Hospital Stay (HOSPITAL_COMMUNITY)
Admission: EM | Admit: 2022-04-15 | Discharge: 2022-04-18 | DRG: 375 | Disposition: A | Discharge status: Home / Self Care | Payer: Medicare Other | Attending: Student | Admitting: Student

## 2022-04-15 DIAGNOSIS — E876 Hypokalemia: Secondary | ICD-10-CM | POA: Diagnosis present

## 2022-04-15 DIAGNOSIS — R7989 Other specified abnormal findings of blood chemistry: Secondary | ICD-10-CM | POA: Diagnosis present

## 2022-04-15 DIAGNOSIS — Z20822 Contact with and (suspected) exposure to covid-19: Secondary | ICD-10-CM | POA: Diagnosis present

## 2022-04-15 DIAGNOSIS — R502 Drug induced fever: Secondary | ICD-10-CM | POA: Diagnosis present

## 2022-04-15 DIAGNOSIS — R823 Hemoglobinuria: Secondary | ICD-10-CM | POA: Diagnosis present

## 2022-04-15 DIAGNOSIS — A419 Sepsis, unspecified organism: Secondary | ICD-10-CM | POA: Diagnosis not present

## 2022-04-15 DIAGNOSIS — K769 Liver disease, unspecified: Secondary | ICD-10-CM | POA: Diagnosis present

## 2022-04-15 DIAGNOSIS — Z7969 Long term (current) use of other immunomodulators and immunosuppressants: Secondary | ICD-10-CM | POA: Diagnosis not present

## 2022-04-15 DIAGNOSIS — I248 Other forms of acute ischemic heart disease: Secondary | ICD-10-CM | POA: Diagnosis present

## 2022-04-15 DIAGNOSIS — Z79899 Other long term (current) drug therapy: Secondary | ICD-10-CM | POA: Diagnosis not present

## 2022-04-15 DIAGNOSIS — R6510 Systemic inflammatory response syndrome (SIRS) of non-infectious origin without acute organ dysfunction: Secondary | ICD-10-CM | POA: Diagnosis present

## 2022-04-15 DIAGNOSIS — D649 Anemia, unspecified: Secondary | ICD-10-CM | POA: Diagnosis not present

## 2022-04-15 DIAGNOSIS — I10 Essential (primary) hypertension: Secondary | ICD-10-CM | POA: Diagnosis present

## 2022-04-15 DIAGNOSIS — D509 Iron deficiency anemia, unspecified: Secondary | ICD-10-CM | POA: Diagnosis present

## 2022-04-15 DIAGNOSIS — I959 Hypotension, unspecified: Secondary | ICD-10-CM | POA: Diagnosis present

## 2022-04-15 DIAGNOSIS — Z8249 Family history of ischemic heart disease and other diseases of the circulatory system: Secondary | ICD-10-CM | POA: Diagnosis not present

## 2022-04-15 DIAGNOSIS — E440 Moderate protein-calorie malnutrition: Secondary | ICD-10-CM | POA: Diagnosis present

## 2022-04-15 DIAGNOSIS — Z87891 Personal history of nicotine dependence: Secondary | ICD-10-CM | POA: Diagnosis not present

## 2022-04-15 DIAGNOSIS — R531 Weakness: Secondary | ICD-10-CM | POA: Diagnosis present

## 2022-04-15 DIAGNOSIS — E872 Acidosis, unspecified: Secondary | ICD-10-CM

## 2022-04-15 DIAGNOSIS — R7881 Bacteremia: Secondary | ICD-10-CM

## 2022-04-15 DIAGNOSIS — R824 Acetonuria: Secondary | ICD-10-CM | POA: Diagnosis present

## 2022-04-15 DIAGNOSIS — D72810 Lymphocytopenia: Secondary | ICD-10-CM | POA: Diagnosis present

## 2022-04-15 DIAGNOSIS — E8809 Other disorders of plasma-protein metabolism, not elsewhere classified: Secondary | ICD-10-CM | POA: Diagnosis present

## 2022-04-15 DIAGNOSIS — E43 Unspecified severe protein-calorie malnutrition: Secondary | ICD-10-CM | POA: Diagnosis present

## 2022-04-15 DIAGNOSIS — R778 Other specified abnormalities of plasma proteins: Secondary | ICD-10-CM | POA: Diagnosis present

## 2022-04-15 DIAGNOSIS — C49A2 Gastrointestinal stromal tumor of stomach: Secondary | ICD-10-CM | POA: Diagnosis present

## 2022-04-15 DIAGNOSIS — T451X5A Adverse effect of antineoplastic and immunosuppressive drugs, initial encounter: Secondary | ICD-10-CM | POA: Diagnosis present

## 2022-04-15 LAB — CBC WITH DIFFERENTIAL/PLATELET
Abs Immature Granulocytes: 0.01 10*3/uL (ref 0.00–0.07)
Basophils Absolute: 0 10*3/uL (ref 0.0–0.1)
Basophils Relative: 0 %
Eosinophils Absolute: 0 10*3/uL (ref 0.0–0.5)
Eosinophils Relative: 0 %
HCT: 30.4 % — ABNORMAL LOW (ref 39.0–52.0)
Hemoglobin: 9.9 g/dL — ABNORMAL LOW (ref 13.0–17.0)
Immature Granulocytes: 0 %
Lymphocytes Relative: 4 %
Lymphs Abs: 0.2 10*3/uL — ABNORMAL LOW (ref 0.7–4.0)
MCH: 27.7 pg (ref 26.0–34.0)
MCHC: 32.6 g/dL (ref 30.0–36.0)
MCV: 84.9 fL (ref 80.0–100.0)
Monocytes Absolute: 0 10*3/uL — ABNORMAL LOW (ref 0.1–1.0)
Monocytes Relative: 1 %
Neutro Abs: 4.6 10*3/uL (ref 1.7–7.7)
Neutrophils Relative %: 95 %
Platelets: 241 10*3/uL (ref 150–400)
RBC: 3.58 MIL/uL — ABNORMAL LOW (ref 4.22–5.81)
RDW: 15.7 % — ABNORMAL HIGH (ref 11.5–15.5)
WBC: 4.9 10*3/uL (ref 4.0–10.5)
nRBC: 0 % (ref 0.0–0.2)

## 2022-04-15 LAB — COMPREHENSIVE METABOLIC PANEL
ALT: 15 U/L (ref 0–44)
AST: 18 U/L (ref 15–41)
Albumin: 2.9 g/dL — ABNORMAL LOW (ref 3.5–5.0)
Alkaline Phosphatase: 67 U/L (ref 38–126)
Anion gap: 9 (ref 5–15)
BUN: 20 mg/dL (ref 8–23)
CO2: 20 mmol/L — ABNORMAL LOW (ref 22–32)
Calcium: 7.7 mg/dL — ABNORMAL LOW (ref 8.9–10.3)
Chloride: 107 mmol/L (ref 98–111)
Creatinine, Ser: 1.13 mg/dL (ref 0.61–1.24)
GFR, Estimated: >60 mL/min (ref >60)
Glucose, Bld: 203 mg/dL — ABNORMAL HIGH (ref 70–99)
Potassium: 3.1 mmol/L — ABNORMAL LOW (ref 3.5–5.1)
Sodium: 136 mmol/L (ref 135–145)
Total Bilirubin: 0.9 mg/dL (ref 0.3–1.2)
Total Protein: 7 g/dL (ref 6.5–8.1)

## 2022-04-15 LAB — URINALYSIS, ROUTINE W REFLEX MICROSCOPIC
Bacteria, UA: NONE SEEN
Bilirubin Urine: NEGATIVE
Glucose, UA: NEGATIVE mg/dL
Ketones, ur: 5 mg/dL — AB
Leukocytes,Ua: NEGATIVE
Nitrite: NEGATIVE
Protein, ur: NEGATIVE mg/dL
Specific Gravity, Urine: 1.014 (ref 1.005–1.030)
pH: 5 (ref 5.0–8.0)

## 2022-04-15 LAB — RESP PANEL BY RT-PCR (FLU A&B, COVID) ARPGX2
Influenza A by PCR: NEGATIVE
Influenza B by PCR: NEGATIVE
SARS Coronavirus 2 by RT PCR: NEGATIVE

## 2022-04-15 LAB — LACTIC ACID, PLASMA
Lactic Acid, Venous: 1.9 mmol/L (ref 0.5–1.9)
Lactic Acid, Venous: 2.9 mmol/L (ref 0.5–1.9)
Lactic Acid, Venous: 1.5 mmol/L (ref 0.5–1.9)

## 2022-04-15 LAB — PROTIME-INR
INR: 1.1 (ref 0.8–1.2)
Prothrombin Time: 14.4 seconds (ref 11.4–15.2)

## 2022-04-15 LAB — TROPONIN I (HIGH SENSITIVITY)
Troponin I (High Sensitivity): 20 ng/L — ABNORMAL HIGH (ref <18)
Troponin I (High Sensitivity): 79 ng/L — ABNORMAL HIGH (ref <18)

## 2022-04-15 LAB — MAGNESIUM: Magnesium: 1.9 mg/dL (ref 1.7–2.4)

## 2022-04-15 LAB — LIPASE, BLOOD: Lipase: 23 U/L (ref 11–51)

## 2022-04-15 LAB — CK: Total CK: 100 U/L (ref 49–397)

## 2022-04-15 LAB — AMMONIA: Ammonia: 12 umol/L (ref 9–35)

## 2022-04-15 LAB — GLUCOSE, CAPILLARY: Glucose-Capillary: 144 mg/dL — ABNORMAL HIGH (ref 70–99)

## 2022-04-15 LAB — TSH: TSH: 0.784 u[IU]/mL (ref 0.350–4.500)

## 2022-04-15 LAB — APTT: aPTT: 26 seconds (ref 24–36)

## 2022-04-15 LAB — CBG MONITORING, ED: Glucose-Capillary: 214 mg/dL — ABNORMAL HIGH (ref 70–99)

## 2022-04-15 MED ORDER — VANCOMYCIN HCL IN DEXTROSE 1-5 GM/200ML-% IV SOLN
1000.0000 mg | INTRAVENOUS | Status: DC
Start: 2022-04-16 — End: 2022-04-16
  Administered 2022-04-16: 1000 mg via INTRAVENOUS
  Filled 2022-04-15: qty 200

## 2022-04-15 MED ORDER — SODIUM CHLORIDE 0.9 % IV SOLN
2.0000 g | Freq: Three times a day (TID) | INTRAVENOUS | Status: DC
  Administered 2022-04-15 – 2022-04-17 (×5): 2 g via INTRAVENOUS
  Filled 2022-04-15 (×6): qty 12.5

## 2022-04-15 MED ORDER — LACTATED RINGERS IV BOLUS (SEPSIS)
1000.0000 mL | Freq: Once | INTRAVENOUS | Status: AC
  Administered 2022-04-15: 1000 mL via INTRAVENOUS

## 2022-04-15 MED ORDER — ONDANSETRON HCL 4 MG/2ML IJ SOLN: 4.0000 mg | Freq: Four times a day (QID) | INTRAMUSCULAR | Status: DC | PRN

## 2022-04-15 MED ORDER — SODIUM CHLORIDE 0.9 % IV SOLN
2.0000 g | Freq: Once | INTRAVENOUS | Status: AC
  Administered 2022-04-15: 2 g via INTRAVENOUS
  Filled 2022-04-15: qty 12.5

## 2022-04-15 MED ORDER — METRONIDAZOLE 500 MG/100ML IV SOLN
500.0000 mg | Freq: Once | INTRAVENOUS | Status: AC
  Administered 2022-04-15: 500 mg via INTRAVENOUS
  Filled 2022-04-15: qty 100

## 2022-04-15 MED ORDER — ONDANSETRON HCL 4 MG PO TABS: 4.0000 mg | ORAL_TABLET | Freq: Four times a day (QID) | ORAL | Status: DC | PRN

## 2022-04-15 MED ORDER — VANCOMYCIN HCL IN DEXTROSE 1-5 GM/200ML-% IV SOLN
1000.0000 mg | Freq: Once | INTRAVENOUS | Status: AC
  Administered 2022-04-15: 1000 mg via INTRAVENOUS
  Filled 2022-04-15: qty 200

## 2022-04-15 MED ORDER — POTASSIUM CHLORIDE CRYS ER 20 MEQ PO TBCR
40.0000 meq | EXTENDED_RELEASE_TABLET | Freq: Once | ORAL | Status: AC
  Administered 2022-04-15: 40 meq via ORAL
  Filled 2022-04-15: qty 2

## 2022-04-15 MED ORDER — LACTATED RINGERS IV BOLUS (SEPSIS)
250.0000 mL | Freq: Once | INTRAVENOUS | Status: AC
  Administered 2022-04-15: 250 mL via INTRAVENOUS

## 2022-04-15 MED ORDER — ACETAMINOPHEN 325 MG PO TABS: 650.0000 mg | ORAL_TABLET | Freq: Four times a day (QID) | ORAL | Status: DC | PRN

## 2022-04-15 MED ORDER — MAGNESIUM SULFATE 2 GM/50ML IV SOLN
2.0000 g | Freq: Once | INTRAVENOUS | Status: AC
  Administered 2022-04-15: 2 g via INTRAVENOUS
  Filled 2022-04-15: qty 50

## 2022-04-15 MED ORDER — METRONIDAZOLE 500 MG/100ML IV SOLN
500.0000 mg | Freq: Two times a day (BID) | INTRAVENOUS | Status: DC
  Administered 2022-04-15 – 2022-04-16 (×3): 500 mg via INTRAVENOUS
  Filled 2022-04-15 (×3): qty 100

## 2022-04-15 MED ORDER — LACTATED RINGERS IV SOLN: INTRAVENOUS | Status: AC

## 2022-04-15 MED ORDER — IOHEXOL 300 MG/ML  SOLN
100.0000 mL | Freq: Once | INTRAMUSCULAR | Status: AC | PRN
  Administered 2022-04-15: 100 mL via INTRAVENOUS

## 2022-04-15 MED ORDER — ACETAMINOPHEN 650 MG RE SUPP: 650.0000 mg | Freq: Four times a day (QID) | RECTAL | Status: DC | PRN

## 2022-04-15 NOTE — ED Notes (Signed)
Pt ambulated to the restroom w/ steady gait w/ standby assistance from ED RN ?

## 2022-04-15 NOTE — Progress Notes (Signed)
Notified provider and bedside nurse of need to order and draw repeat lactic acid #3 at 1300.  ?

## 2022-04-15 NOTE — Progress Notes (Signed)
Pharmacy Antibiotic Note ? ?Kevin [name] is a 71 y.o. male admitted on 04/15/2022 with sepsis.  Pharmacy has  ?been consulted for vancomycin dosing. ? ?PMH is significant for hypertension, anemia, and malignant gastrointestinal stromal tumor (GIST) of stomach who presents with fatigue, myalgias, recent abdominal pain, and fever.   ? ?No weight entered in ED. Using listed weight of 71.kg from outpt visit from 03/15/2022 ? ? ?Plan: ?Cefepime 2 gr IV q8h  ?Metronidazole 500 mg IV q12h  ?Vancomycin 1000 mg IV q24h ( AUC 448, SCr 1.13)  ?Monitor clinical course, renal function, cultures as available ? ? ?  ? ?Temp (24hrs), Avg:101.8 ?F (38.8 ?C), Min:99.2 ?F (37.3 ?C), Max:105.1 ?F (40.6 ?C) ? ?Recent Labs  ?Lab 04/15/22 ?9211 04/15/22 ?1054  ?WBC 4.9  --   ?CREATININE 1.13  --   ?LATICACIDVEN 1.9 2.9*  ?  ?CrCl cannot be calculated (Unknown ideal weight.).   ? ?No Known Allergies ? ?Antimicrobials this admission: ?5/8  metronidazole >>  ?5/8 cefepime  >>  ?5/8 vancomycin >>  ? ?Dose adjustments this admission: ? ? ?Microbiology results: ?5/8 BCx:  ?5/8 UCx:   ?  ? ?Thank you for allowing pharmacy to be a part of this patient?s care. ? ? ?Royetta Asal, PharmD, BCPS ?04/15/2022 2:00 PM ? ? ?

## 2022-04-15 NOTE — ED Triage Notes (Signed)
Pt bib ems from home for weakness and fever.  Per ems temporal fever 101.3, tachycardia and hypertension.  CBG 180.  EMS gave 650 tylenol.  ?

## 2022-04-15 NOTE — Progress Notes (Signed)
Notified bedside nurse of need to draw lactic acid #3.  

## 2022-04-15 NOTE — ED Notes (Signed)
MD Olevia Bowens in room w/ pt  ?

## 2022-04-15 NOTE — ED Provider Notes (Signed)
?[place] DEPT ?Provider Note ? ? ?CSN: [id number] ?Arrival date & time: 04/15/22  0844 ? ?  ? ?History ? ?Chief Complaint  ?Patient presents with  ? Weakness  ? Fever  ? ? ?Kevin [name] is a 71 y.o. male. ? ?The history is provided by the patient and medical records. No language interpreter was used.  ?Weakness ?Severity:  Mild ?Onset quality:  Gradual ?Duration:  1 day ?Timing:  Constant ?Progression:  Improving ?Chronicity:  New ?Relieved by:  Nothing ?Worsened by:  Nothing ?Ineffective treatments:  None tried ?Associated symptoms: abdominal pain (resolved now), fever and myalgias   ?Associated symptoms: no aphasia, no chest pain, no cough, no diarrhea, no dysuria, no numbness in extremities, no falls, no foul-smelling urine, no frequency, no headaches, no loss of consciousness, no nausea, no near-syncope, no sensory-motor deficit, no shortness of breath, no stroke symptoms, no urgency, no vision change and no vomiting   ?Fever ?Associated symptoms: chills and myalgias   ?Associated symptoms: no chest pain, no congestion, no cough, no diarrhea, no dysuria, no headaches, no nausea, no rash and no vomiting   ? ?  ? ?Home Medications ?Prior to Admission medications   ?Medication Sig Start Date End Date Taking? Authorizing Provider  ?amLODipine (NORVASC) 10 MG tablet TAKE 1 TABLET BY MOUTH DAILY. 08/16/21 08/16/22  Ladell Pier, MD  ?amLODipine (NORVASC) 10 MG tablet Take 1 tablet (10 mg total) by mouth daily. 01/25/22   Ladell Pier, MD  ?ferrous sulfate 325 (65 FE) MG tablet Take 1 tablet (325 mg total) by mouth 2 (two) times daily with a meal. 01/28/20   Mercy Riding, MD  ?imatinib (GLEEVEC) 100 MG tablet TAKE 4 TABLETS (400 MG) BY MOUTH ONCE DAILY. TAKE WITH MEALS AND A FULL GLASS OF WATER. CAUTION: CHEMOTHERAPY 03/15/22 03/15/23  Ladell Pier, MD  ?lisinopril (ZESTRIL) 10 MG tablet Take 1 tablet (10 mg total) by mouth daily. 08/16/21   Ladell Pier, MD  ?lisinopril  (ZESTRIL) 10 MG tablet Take 1 tablet (10 mg total) by mouth daily. 01/25/22   Ladell Pier, MD  ?   ? ?Allergies    ?Patient has no known allergies.   ? ?Review of Systems   ?Review of Systems  ?Constitutional:  Positive for chills, fatigue and fever. Negative for diaphoresis.  ?HENT:  Negative for congestion.   ?Eyes:  Negative for visual disturbance.  ?Respiratory:  Negative for cough, chest tightness, shortness of breath and wheezing.   ?Cardiovascular:  Negative for chest pain, palpitations, leg swelling and near-syncope.  ?Gastrointestinal:  Positive for abdominal pain (resolved now). Negative for constipation, diarrhea, nausea and vomiting.  ?Genitourinary:  Negative for dysuria, flank pain, frequency, hematuria and urgency.  ?Musculoskeletal:  Positive for myalgias. Negative for back pain, falls, neck pain and neck stiffness.  ?Skin:  Negative for rash and wound.  ?Neurological:  Positive for weakness. Negative for loss of consciousness, speech difficulty, light-headedness, numbness and headaches.  ?Psychiatric/Behavioral:  Negative for agitation.   ?All other systems reviewed and are negative. ? ?Physical Exam ?Updated Vital Signs ?BP 137/74 (BP Location: Right Arm)   Pulse (!) 136   Temp (!) 105.1 ?F (40.6 ?C) (Rectal)   Resp 20   SpO2 97%  ?Physical Exam ?Vitals and nursing note reviewed.  ?Constitutional:   ?   General: He is not in acute distress. ?   Appearance: He is well-developed. He is ill-appearing. He is not toxic-appearing or diaphoretic.  ?HENT:  ?  Head: Normocephalic and atraumatic.  ?   Nose: No congestion or rhinorrhea.  ?   Mouth/Throat:  ?   Mouth: Mucous membranes are dry.  ?   Pharynx: No oropharyngeal exudate or posterior oropharyngeal erythema.  ?Eyes:  ?   Extraocular Movements: Extraocular movements intact.  ?   Conjunctiva/sclera: Conjunctivae normal.  ?   Pupils: Pupils are equal, round, and reactive to light.  ?Cardiovascular:  ?   Rate and Rhythm: Regular rhythm.  Tachycardia present.  ?   Heart sounds: No murmur heard. ?Pulmonary:  ?   Effort: Pulmonary effort is normal. No respiratory distress.  ?   Breath sounds: Normal breath sounds. No wheezing, rhonchi or rales.  ?Chest:  ?   Chest wall: No tenderness.  ?Abdominal:  ?   General: Abdomen is flat. There is no distension.  ?   Palpations: Abdomen is soft.  ?   Tenderness: There is no abdominal tenderness. There is no right CVA tenderness, left CVA tenderness, guarding or rebound.  ?Musculoskeletal:     ?   General: No swelling or tenderness.  ?   Cervical back: Neck supple. No tenderness.  ?   Right lower leg: No edema.  ?   Left lower leg: No edema.  ?Skin: ?   General: Skin is warm and dry.  ?   Capillary Refill: Capillary refill takes less than 2 seconds.  ?   Findings: No erythema or rash.  ?Neurological:  ?   General: No focal deficit present.  ?   Mental Status: He is alert.  ?Psychiatric:     ?   Mood and Affect: Mood normal.  ? ? ?ED Results / Procedures / Treatments   ?Labs ?(all labs ordered are listed, but only abnormal results are displayed) ?Labs Reviewed  ?LACTIC ACID, PLASMA - Abnormal; Notable for the following components:  ?    Result Value  ? Lactic Acid, Venous 2.9 (*)   ? All other components within normal limits  ?COMPREHENSIVE METABOLIC PANEL - Abnormal; Notable for the following components:  ? Potassium 3.1 (*)   ? CO2 20 (*)   ? Glucose, Bld 203 (*)   ? Calcium 7.7 (*)   ? Albumin 2.9 (*)   ? All other components within normal limits  ?CBC WITH DIFFERENTIAL/PLATELET - Abnormal; Notable for the following components:  ? RBC 3.58 (*)   ? Hemoglobin 9.9 (*)   ? HCT 30.4 (*)   ? RDW 15.7 (*)   ? Lymphs Abs 0.2 (*)   ? Monocytes Absolute 0.0 (*)   ? All other components within normal limits  ?URINALYSIS, ROUTINE W REFLEX MICROSCOPIC - Abnormal; Notable for the following components:  ? Hgb urine dipstick MODERATE (*)   ? Ketones, ur 5 (*)   ? All other components within normal limits  ?CBG MONITORING, ED -  Abnormal; Notable for the following components:  ? Glucose-Capillary 214 (*)   ? All other components within normal limits  ?TROPONIN I (HIGH SENSITIVITY) - Abnormal; Notable for the following components:  ? Troponin I (High Sensitivity) 20 (*)   ? All other components within normal limits  ?TROPONIN I (HIGH SENSITIVITY) - Abnormal; Notable for the following components:  ? Troponin I (High Sensitivity) 79 (*)   ? All other components within normal limits  ?RESP PANEL BY RT-PCR (FLU A&B, COVID) ARPGX2  ?CULTURE, BLOOD (ROUTINE X 2)  ?CULTURE, BLOOD (ROUTINE X 2)  ?URINE CULTURE  ?LACTIC ACID, PLASMA  ?PROTIME-INR  ?  APTT  ?LIPASE, BLOOD  ?AMMONIA  ?CK  ?MAGNESIUM  ?TSH  ?LACTIC ACID, PLASMA  ? ? ?EKG ?EKG Interpretation ? ?Date/Time:  Monday Apr 15 2022 09:02:03 EDT ?Ventricular Rate:  134 ?PR Interval:  139 ?QRS Duration: 83 ?QT Interval:  280 ?QTC Calculation: 418 ?R Axis:   73 ?Text Interpretation: Sinus tachycardia Ventricular premature complex Probable LVH with secondary repol abnrm ST depression, consider ischemia, diffuse lds Baseline wander in lead(s) II aVR V6 when compared to prior, faster rate. No STEMI Confirmed by Antony Blackbird [id number]) on 04/15/2022 9:16:18 AM ? ?Radiology ?CT ABDOMEN PELVIS W CONTRAST ? ?Result Date: 04/15/2022 ?CLINICAL DATA:  History of liver abscess. Weakness and fevers. Known GI stromal tumor on oral chemotherapy. EXAM: CT ABDOMEN AND PELVIS WITH CONTRAST TECHNIQUE: Multidetector CT imaging of the abdomen and pelvis was performed using the standard protocol following bolus administration of intravenous contrast. RADIATION DOSE REDUCTION: This exam was performed according to the departmental dose-optimization program which includes automated exposure control, adjustment of the mA and/or kV according to patient size and/or use of iterative reconstruction technique. CONTRAST:  110m OMNIPAQUE IOHEXOL 300 MG/ML  SOLN COMPARISON:  03/15/2022 FINDINGS: Lower chest: Mild dependent changes  noted within the posterior lung bases. No pleural effusion or airspace consolidation. Hepatobiliary: No focal liver abnormality is seen. No gallstones, gallbladder wall thickening, or biliary dilatation. Pancreas: No signs

## 2022-04-15 NOTE — Progress Notes (Signed)
Elink following code sepsis °

## 2022-04-15 NOTE — H&P (Signed)
?History and Physical  ? ? ?Patient: Kevin [name] HGD:[id number] DOB: Sep 09, 1951 ?DOA: 04/15/2022 ?DOS: the patient was seen and examined on 04/15/2022 ?PCP: Ladell Pier, MD  ?Patient coming from: Home ? ?Chief Complaint:  ?Chief Complaint  ?Patient presents with  ? Weakness  ? Fever  ? ?HPI: Kevin [name] is a 71 y.o. male with medical history significant of anemia, hypertension, malignant gastrointestinal stromal tumor of stomach, tobacco use, protein calorie malnutrition who presented to the emergency department via EMS with complaints of generalized weakness, fever associated with chills, dizziness, mild dyspnea, decreased appetite, fatigue and malaise.  He also had abdominal pain several days ago.  He has been having bilateral lower extremity pain on and off from the knees down for several days but does not have it now.  The patient is not a good historian and was unable to describe his bilateral pain in detail.  No chest pain, palpitations, diaphoresis, PND, orthopnea or pitting edema of the lower extremities.  He had abdominal pain a few days ago, but denied pain at this moment.  No nausea, vomiting, diarrhea, constipation, melena or hematochezia.  No flank pain, dysuria, frequency or hematuria.  No polyuria, polydipsia, polyphagia or blurred vision. ? ?ED course: Initial vital signs were temperature 105.1 ?F, pulse 138, respirations 16, BP 137/74 mmHg O2 sat 93% on room air.  The patient was transiently hypotensive with a blood pressure 102/56 mmHg and his previously normal lactic went up to 2.9 mmol/L.  He received cefepime, metronidazole, vancomycin, KCl 40 mEq p.o. and 2250 mL of LR bolus. ? ?Lab work: Urinalysis showed moderate hemoglobinuria, ketonuria 5 mg/dL but was otherwise unremarkable.  CBC with a white count of 4.9, hemoglobin 9.9 g/dL platelets 241.  Normal PT, INR and PTT.  Within normal limits lipase, ammonia and total CK.  CMP showed potassium of 3.1 and CO2 of 20 mmol/L.  Anion gap was 9.   Glucose 203 and calcium 7.7 mg deciliter.  Albumin is 2.9 g/dL, the rest of the hepatic function, BUN and creatinine were within expected range. ? ?Imaging: One-view portable chest radiograph with no active disease.  CT abdomen/pelvis with contrast showed decreased in the gastric tumor volume, however there are signs of internal necrosis again identified within the tumor.  New gas is identified within the dominant necrotic component.  There is mild bilateral perinephric haziness with a question for pyelonephritis.  There was prostatomegaly.  Please see images and full radiology report for further details. ?  ?Review of Systems: As mentioned in the history of present illness. All other systems reviewed and are negative. ?Past Medical History:  ?Diagnosis Date  ? Anemia   ? Hypertension   ? Malignant gastrointestinal stromal tumor (GIST) of stomach ([place]) 06/2021  ? ?Past Surgical History:  ?Procedure Laterality Date  ? BIOPSY  01/24/2020  ? Procedure: BIOPSY;  Surgeon: Wonda Horner, MD;  Location: [facility] ENDOSCOPY;  Service: Endoscopy;;  ? ESOPHAGOGASTRODUODENOSCOPY (EGD) WITH PROPOFOL N/A 01/24/2020  ? Procedure: ESOPHAGOGASTRODUODENOSCOPY (EGD) WITH PROPOFOL;  Surgeon: Wonda Horner, MD;  Location: [facility] ENDOSCOPY;  Service: Endoscopy;  Laterality: N/A;  ? IR CATHETER TUBE CHANGE  02/18/2020  ? IR RADIOLOGIST EVAL & MGMT  02/15/2020  ? IR RADIOLOGIST EVAL & MGMT  03/09/2020  ? SCLEROTHERAPY  01/24/2020  ? Procedure: SCLEROTHERAPY;  Surgeon: Wonda Horner, MD;  Location: [facility] ENDOSCOPY;  Service: Endoscopy;;  ? ?Social History:  reports that he quit smoking about 25 years ago. His smoking use included cigarettes.  He has never used smokeless tobacco. He reports that he does not currently use alcohol. He reports that he does not currently use drugs. ? ?No Known Allergies ? ?Family History  ?Problem Relation Age of Onset  ? Hypertension Other   ? ? ?Prior to Admission medications   ?Medication Sig Start Date End Date Taking?  Authorizing Provider  ?acetaminophen (TYLENOL) 500 MG tablet Take 1,000 mg by mouth every 6 (six) hours as needed for mild pain.   Yes [provider]  ?amLODipine (NORVASC) 10 MG tablet TAKE 1 TABLET BY MOUTH DAILY. ?Patient taking differently: Take 10 mg by mouth daily. 08/16/21 08/16/22 Yes Ladell Pier, MD  ?imatinib (GLEEVEC) 100 MG tablet TAKE 4 TABLETS (400 MG) BY MOUTH ONCE DAILY. TAKE WITH MEALS AND A FULL GLASS OF WATER. CAUTION: CHEMOTHERAPY ?Patient taking differently: Take 400 mg by mouth daily. 03/15/22 03/15/23 Yes Ladell Pier, MD  ?lisinopril (ZESTRIL) 10 MG tablet Take 1 tablet (10 mg total) by mouth daily. 08/16/21  Yes Ladell Pier, MD  ? ? ?Physical Exam: ?Vitals:  ? 04/15/22 1240 04/15/22 1245 04/15/22 1300 04/15/22 1315  ?BP:  114/68 119/66 104/65  ?Pulse:  87 84 85  ?Resp:  (!) '22 20 18  '$ ?Temp: 99.2 ?F (37.3 ?C)     ?TempSrc: Oral     ?SpO2:  95% 96% 94%  ? ?Physical Exam ?Vitals and nursing note reviewed.  ?Constitutional:   ?   Appearance: He is normal weight. He is ill-appearing.  ?HENT:  ?   Head: Normocephalic.  ?   Mouth/Throat:  ?   Mouth: Mucous membranes are dry.  ?Eyes:  ?   General: No scleral icterus. ?   Pupils: Pupils are equal, round, and reactive to light.  ?Neck:  ?   Vascular: No JVD.  ?Cardiovascular:  ?   Rate and Rhythm: Regular rhythm. Tachycardia present.  ?Pulmonary:  ?   Effort: Pulmonary effort is normal. No respiratory distress.  ?   Breath sounds: Normal breath sounds. No wheezing or rales.  ?Abdominal:  ?   General: Bowel sounds are normal. There is no distension.  ?   Palpations: Abdomen is soft.  ?   Tenderness: There is no abdominal tenderness. There is no right CVA tenderness, left CVA tenderness or guarding.  ?Musculoskeletal:  ?   Cervical back: Neck supple.  ?   Right lower leg: No edema.  ?   Left lower leg: No edema.  ?   Comments: Mild to moderate generalized weakness.  ?Skin: ?   General: Skin is warm and dry.  ?Neurological:  ?    General: No focal deficit present.  ?   Mental Status: He is alert and oriented to person, place, and time.  ?Psychiatric:     ?   Mood and Affect: Mood normal.     ?   Behavior: Behavior normal.  ? ? ?Data Reviewed: ? ?There are no new results to review at this time. ? ?Assessment and Plan: ?Principal Problem: ?  Sepsis due to undetermined organism [facility]) ?Admit to PCU/inpatient. ?Continue IV fluids. ?Continue cefepime 2 g every 8 hours.   ?Continue metronidazole 500 mg IVPB q 12 hr. ?Continue vancomycin per pharmacy. ?Follow-up blood culture and sensitivity ?Will hold  Imatimib. ?Follow CBC and CMP in a.m. ? ?Active Problems: ?  Elevated troponin ?No chest pain. ?No significant uptrend ?Likely demand ischemia. ?Continue telemetry monitoring. ? ?  Malignant gastrointestinal stromal tumor (GIST) of stomach ([place]) ?Continue  follow-up and/or treatment per oncology. ? ?  Essential hypertension ?Hypotensive earlier. ?Hold amlodipine. ?Hold lisinopril. ?Monitor blood pressure. ? ?  Hypokalemia ?Replenishing. ?Magnesium was supplemented. ?Follow potassium level. ? ?  Protein-calorie malnutrition, moderate ([place]) ?Protein supplementation. ?Consider nutritional services evaluation. ?Follow-up albumin level in AM. ? ?  Hypocalcemia ?Recheck calcium and albumin level in AM. ?Further work-up/treatment depending on result. ? ? ? Advance Care Planning:   Code Status: Full Code  ? ?Consults:  ? ?Family Communication:  ? ?Severity of Illness: ?The appropriate patient status for this patient is INPATIENT. Inpatient status is judged to be reasonable and necessary in order to provide the required intensity of service to ensure the patient's safety. The patient's presenting symptoms, physical exam findings, and initial radiographic and laboratory data in the context of their chronic comorbidities is felt to place them at high risk for further clinical deterioration. Furthermore, it is not anticipated that the patient will be medically  stable for discharge from the hospital within 2 midnights of admission.  ? ?* I certify that at the point of admission it is my clinical judgment that the patient will require inpatient hospital care spanning

## 2022-04-15 NOTE — Progress Notes (Signed)
A consult was received from an ED physician for vanc/cefepime per pharmacy dosing.  The patient's profile has been reviewed for ht/wt/allergies/indication/available labs.   ?A one time order has been placed for vanc 1g and cefepime 2g.  Further antibiotics/pharmacy consults should be ordered by admitting physician if indicated.       ?                ?Thank you, ?Kara Mead ?04/15/2022  9:11 AM ? ?

## 2022-04-16 DIAGNOSIS — R778 Other specified abnormalities of plasma proteins: Secondary | ICD-10-CM

## 2022-04-16 DIAGNOSIS — E440 Moderate protein-calorie malnutrition: Secondary | ICD-10-CM

## 2022-04-16 DIAGNOSIS — I10 Essential (primary) hypertension: Secondary | ICD-10-CM

## 2022-04-16 DIAGNOSIS — E876 Hypokalemia: Secondary | ICD-10-CM

## 2022-04-16 DIAGNOSIS — A419 Sepsis, unspecified organism: Secondary | ICD-10-CM | POA: Diagnosis not present

## 2022-04-16 LAB — CBC WITH DIFFERENTIAL/PLATELET
Abs Immature Granulocytes: 0.03 10*3/uL (ref 0.00–0.07)
Basophils Absolute: 0 10*3/uL (ref 0.0–0.1)
Basophils Relative: 0 %
Eosinophils Absolute: 0.1 10*3/uL (ref 0.0–0.5)
Eosinophils Relative: 2 %
HCT: 26.7 % — ABNORMAL LOW (ref 39.0–52.0)
Hemoglobin: 8.4 g/dL — ABNORMAL LOW (ref 13.0–17.0)
Immature Granulocytes: 1 %
Lymphocytes Relative: 10 %
Lymphs Abs: 0.4 10*3/uL — ABNORMAL LOW (ref 0.7–4.0)
MCH: 27.3 pg (ref 26.0–34.0)
MCHC: 31.5 g/dL (ref 30.0–36.0)
MCV: 86.7 fL (ref 80.0–100.0)
Monocytes Absolute: 0.3 10*3/uL (ref 0.1–1.0)
Monocytes Relative: 7 %
Neutro Abs: 3.3 10*3/uL (ref 1.7–7.7)
Neutrophils Relative %: 80 %
Platelets: 190 10*3/uL (ref 150–400)
RBC: 3.08 MIL/uL — ABNORMAL LOW (ref 4.22–5.81)
RDW: 16.1 % — ABNORMAL HIGH (ref 11.5–15.5)
WBC: 4.1 10*3/uL (ref 4.0–10.5)
nRBC: 0 % (ref 0.0–0.2)

## 2022-04-16 LAB — COMPREHENSIVE METABOLIC PANEL
ALT: 12 U/L (ref 0–44)
AST: 17 U/L (ref 15–41)
Albumin: 2.2 g/dL — ABNORMAL LOW (ref 3.5–5.0)
Alkaline Phosphatase: 47 U/L (ref 38–126)
Anion gap: 6 (ref 5–15)
BUN: 15 mg/dL (ref 8–23)
CO2: 22 mmol/L (ref 22–32)
Calcium: 7.2 mg/dL — ABNORMAL LOW (ref 8.9–10.3)
Chloride: 108 mmol/L (ref 98–111)
Creatinine, Ser: 0.9 mg/dL (ref 0.61–1.24)
GFR, Estimated: >60 mL/min (ref >60)
Glucose, Bld: 116 mg/dL — ABNORMAL HIGH (ref 70–99)
Potassium: 3.4 mmol/L — ABNORMAL LOW (ref 3.5–5.1)
Sodium: 136 mmol/L (ref 135–145)
Total Bilirubin: 0.8 mg/dL (ref 0.3–1.2)
Total Protein: 5.7 g/dL — ABNORMAL LOW (ref 6.5–8.1)

## 2022-04-16 LAB — BLOOD CULTURE ID PANEL (REFLEXED) - BCID2

## 2022-04-16 LAB — URINE CULTURE: Culture: <10000 — AB

## 2022-04-16 MED ORDER — VANCOMYCIN HCL 1250 MG/250ML IV SOLN
1250.0000 mg | INTRAVENOUS | Status: DC
  Administered 2022-04-17: 1250 mg via INTRAVENOUS
  Filled 2022-04-16: qty 250

## 2022-04-16 MED ORDER — POTASSIUM CHLORIDE 20 MEQ PO PACK
40.0000 meq | PACK | Freq: Two times a day (BID) | ORAL | Status: AC
  Administered 2022-04-16 (×2): 40 meq via ORAL
  Filled 2022-04-16 (×2): qty 2

## 2022-04-16 NOTE — Progress Notes (Signed)
[facility] ?PROGRESS NOTE ? ? ? ?Progress Note  ?Kevin [name]  MVH:[id number] DOB: 1951/03/03 DOA: 04/15/2022 ?PCP: Ladell Pier, MD  ? ? ? ?Brief Narrative:  ? ?Kevin [name] is an 71 y.o. male past medical history significant for anemia, essential hypertension malignant gastrointestinal stromal tumor of the stomach, tobacco abuse protein caloric malnutrition comes into the ED for weakness fever chills dizziness and decreased appetite accompanied by abdominal pain.  Temperature of 105 pulse, was transiently hypotensive was resuscitated started empirically on Vanco, cefepime and Flagyl.  White count of 4.9 hemoglobin of 9, potassium 3.1 bicarb 20 anion gap of 9 CT scan of the abdomen and pelvis with contrast showed decreasing gastric tumor volume however there was signs of internal necrosis identified within the tumor, New gas is identified within the dominant necrotic component.  With some mild bilateral perinephric haziness. ? ?Assessment/Plan:  ? ?Sepsis due to undetermined organism [facility]) ?He has been fluid resuscitated started on IV vancomycin cefepime and Flagyl. ?Likely source is his tumor which showed necrosis and some gas within the mass ?Culture data has been sent, has defervesced last temp this morning was 99. ?Has no leukocytosis.  Lactic acidosis is resolved ?Need to continue to hold imatinib. ?Blood pressure is stable. ? ?Elevated trops: ?Denies any chest pain shortness of breath likely demand ischemia. ? ?Malignant gastrointestinal stromal tumor (GIST) of stomach ([place]) ?Follow-up with oncology as an outpatient need to hold imatinib. ? ?Essential hypertension ?Was transiently hypotensive continue to hold amlodipine and lisinopril. ?Blood pressure has stabilized. ? ?Hypokalemia: ?Continues to be low replete orally magnesium was 1.9. ? ?Severe protein caloric malnutrition: ?Consult nutrition. ? ?Hypocalcemia: ?Likely pseudo corrected for his albumin is ? ? ?DVT prophylaxis: lovenox ?Family  Communication:none ?Status is: Inpatient ?Remains inpatient appropriate because: Sepsis of unclear source ? ? ? ?Code Status:  ? ?  ?Code Status Orders  ?(From admission, onward)  ?  ? ? ?  ? ?  Start     Ordered  ? 04/15/22 1227  Full code  Continuous       ? 04/15/22 1315  ? ?  ?  ? ?  ? ?Code Status History   ? ? Date Active Date Inactive Code Status Order ID Comments User Context  ? 02/02/2020 1741 02/03/2020 2259 DNR [id number]  Knox Royalty, NP Inpatient  ? 01/21/2020 2359 02/02/2020 1741 Full Code [id number]  Jani Gravel, MD ED  ? ?  ? ? ? ? ?IV Access:  ? ?Peripheral IV ? ? ?Procedures and diagnostic studies:  ? ?CT ABDOMEN PELVIS W CONTRAST ? ?Result Date: 04/15/2022 ?CLINICAL DATA:  History of liver abscess. Weakness and fevers. Known GI stromal tumor on oral chemotherapy. EXAM: CT ABDOMEN AND PELVIS WITH CONTRAST TECHNIQUE: Multidetector CT imaging of the abdomen and pelvis was performed using the standard protocol following bolus administration of intravenous contrast. RADIATION DOSE REDUCTION: This exam was performed according to the departmental dose-optimization program which includes automated exposure control, adjustment of the mA and/or kV according to patient size and/or use of iterative reconstruction technique. CONTRAST:  157m OMNIPAQUE IOHEXOL 300 MG/ML  SOLN COMPARISON:  03/15/2022 FINDINGS: Lower chest: Mild dependent changes noted within the posterior lung bases. No pleural effusion or airspace consolidation. Hepatobiliary: No focal liver abnormality is seen. No gallstones, gallbladder wall thickening, or biliary dilatation. Pancreas: No signs of pancreatic inflammation, mass or main duct dilatation. Mass arising off the posterior stomach has mass effect upon the tail of pancreas as noted previously. Spleen: Normal  in size without focal abnormality. Adrenals/Urinary Tract: Normal appearance of the adrenal glands. Mild bilateral perinephric haziness is identified which appears new from previous  exam and may be seen in the setting of pyelonephritis. No hydronephrosis identified. 3 cm Bosniak class 1 cyst arises off the lateral cortex of the upper pole of left kidney. Inferior pole right kidney scarring is unchanged. Bladder is unremarkable. Stomach/Bowel: Large exophytic mass arising off the posterior wall of the stomach is again noted. This measures 11.4 x 9.6 by 10.9 cm (volume = 620 cm^3), image 18/2 and image 63/5. Previously this measured 12.4 by 9.0 by 12.0 cm (volume = 700 cm^3). There are several necrotic components within this mass as noted previously. The largest necrotic component measures 8.1 x 4.9 cm, image 21/2. Previously 7.8 by 5.7 cm. Gas is now seen within this dominant component scratch set multiple foci of gas are now seen within this necrotic component. Presence of gas is nonspecific and may be seen in the setting of tumor necrosis. Fistulous communication of the necrotic component within the lumen of the stomach could also explain this finding. Superimposed infection of the necrotic component would be difficult to exclude with a high degree of certainty. The appendix is visualized and appears normal. No signs of bowel wall thickening, inflammation, or distension. Vascular/Lymphatic: Normal appearance of the abdominal aorta. The portal vein, portal venous confluence and splenic vein remain patent. Prominent upper abdominal retroperitoneal lymph nodes are identified and appears similar to previous exam. None of these appear to meet CT criteria for adenopathy. No pelvic or inguinal adenopathy. Reproductive: Prostate gland enlargement. Other: No free fluid or fluid collections. No signs of pneumoperitoneum. Musculoskeletal: No acute or suspicious osseous findings. IMPRESSION: 1. Large exophytic mass arising off the posterior wall of the stomach is again noted compatible with patient's known gastric gastrointestinal stromal tumor. Compared to previous exam there has been mild decrease in  tumor volume. Signs of internal necrosis are again identified within this tumor. New gas is identified within the dominant necrotic component. Presence of gas within necrotic tumor is nonspecific and may be seen in the setting of tumor necrosis. Alternatively fistulous communication with the lumen of the stomach may also have this appearance. Superimposed infection of the necrotic components would be difficult to exclude with a high degree of certainty 2. Mild bilateral perinephric haziness is identified which appears new from previous exam and may be seen in the setting of pyelonephritis. Correlation with urinalysis is suggested. 3. Prostate gland enlargement. Electronically Signed   By: Kerby Moors M.D.   On: 04/15/2022 11:09  ? ?DG Chest Port 1 View ? ?Result Date: 04/15/2022 ?CLINICAL DATA:  Questionable sepsis.  Tachycardia and hypertension. EXAM: PORTABLE CHEST 1 VIEW COMPARISON:  01/27/2020 FINDINGS: The heart size and mediastinal contours are within normal limits. Lungs are hypoinflated but clear. The visualized skeletal structures are unremarkable. IMPRESSION: No active disease. Electronically Signed   By: Kerby Moors M.D.   On: 04/15/2022 09:30   ? ? ?Medical Consultants:  ? ?None. ? ? ?Subjective:  ? ? ?Jeramie Scogin relates his abdominal pain and nausea are better.  He is tolerating his diet. ? ?Objective:  ? ? ?Vitals:  ? 04/15/22 1815 04/15/22 2152 04/16/22 0209 04/16/22 0532  ?BP: 130/76 129/65 135/69 134/85  ?Pulse: 72 97 90 82  ?Resp: '16 19 19 19  '$ ?Temp: 98.4 ?F (36.9 ?C) 98.6 ?F (37 ?C) 99.3 ?F (37.4 ?C) 98 ?F (36.7 ?C)  ?TempSrc: Oral Oral Oral Oral  ?  SpO2: 100% 97% 98% 97%  ? ?SpO2: 97 % ? ? ?Intake/Output Summary (Last 24 hours) at 04/16/2022 0708 ?Last data filed at 04/16/2022 0600 ?Gross per 24 hour  ?Intake 3198.59 ml  ?Output 1725 ml  ?Net 1473.59 ml  ? ?There were no vitals filed for this visit. ? ?Exam: ?General exam: In no acute distress. ?Respiratory system: Good air movement and clear to  auscultation. ?Cardiovascular system: S1 & S2 heard, RRR. No JVD. ?Gastrointestinal system: Abdomen is nondistended, soft and nontender.  ?Extremities: No pedal edema. ?Skin: No rashes, lesions or ulcers ?Psych

## 2022-04-16 NOTE — Progress Notes (Signed)
PHARMACY - PHYSICIAN COMMUNICATION ?CRITICAL VALUE ALERT - BLOOD CULTURE IDENTIFICATION (BCID) ? ?Kevin [name] is an 71 y.o. male who presented to [facility] on 04/15/2022 with a chief complaint of weakness, fever, chills, dizziness, abd pain. Hx GIST with new imaging showing internal necrosis/gas w/in tumor; suspicious for infxn. ? ?Assessment:  1/4 BCx growing methicillin resistant Staph Epi (suspect contam)  ? ?Name of physician (or Provider) Contacted: Aileen Fass ? ?Current antibiotics: Vancomycin, Cefepime, Flagyl ? ?Changes to prescribed antibiotics recommended:  ?Patient is on recommended antibiotics - No changes needed ? ?Results for orders placed or performed during the hospital encounter of 04/15/22  ?Blood Culture ID Panel (Reflexed) (Collected: 04/15/2022  9:30 AM)  ?Result Value Ref Range  ? Enterococcus faecalis NOT DETECTED NOT DETECTED  ? Enterococcus Faecium NOT DETECTED NOT DETECTED  ? Listeria monocytogenes NOT DETECTED NOT DETECTED  ? Staphylococcus species DETECTED (A) NOT DETECTED  ? Staphylococcus aureus (BCID) NOT DETECTED NOT DETECTED  ? Staphylococcus epidermidis DETECTED (A) NOT DETECTED  ? Staphylococcus lugdunensis NOT DETECTED NOT DETECTED  ? Streptococcus species NOT DETECTED NOT DETECTED  ? Streptococcus agalactiae NOT DETECTED NOT DETECTED  ? Streptococcus pneumoniae NOT DETECTED NOT DETECTED  ? Streptococcus pyogenes NOT DETECTED NOT DETECTED  ? A.calcoaceticus-baumannii NOT DETECTED NOT DETECTED  ? Bacteroides fragilis NOT DETECTED NOT DETECTED  ? Enterobacterales NOT DETECTED NOT DETECTED  ? Enterobacter cloacae complex NOT DETECTED NOT DETECTED  ? Escherichia coli NOT DETECTED NOT DETECTED  ? Klebsiella aerogenes NOT DETECTED NOT DETECTED  ? Klebsiella oxytoca NOT DETECTED NOT DETECTED  ? Klebsiella pneumoniae NOT DETECTED NOT DETECTED  ? Proteus species NOT DETECTED NOT DETECTED  ? Salmonella species NOT DETECTED NOT DETECTED  ? Serratia marcescens NOT DETECTED NOT DETECTED  ?  Haemophilus influenzae NOT DETECTED NOT DETECTED  ? Neisseria meningitidis NOT DETECTED NOT DETECTED  ? Pseudomonas aeruginosa NOT DETECTED NOT DETECTED  ? Stenotrophomonas maltophilia NOT DETECTED NOT DETECTED  ? Candida albicans NOT DETECTED NOT DETECTED  ? Candida auris NOT DETECTED NOT DETECTED  ? Candida glabrata NOT DETECTED NOT DETECTED  ? Candida krusei NOT DETECTED NOT DETECTED  ? Candida parapsilosis NOT DETECTED NOT DETECTED  ? Candida tropicalis NOT DETECTED NOT DETECTED  ? Cryptococcus neoformans/gattii NOT DETECTED NOT DETECTED  ? Methicillin resistance mecA/C DETECTED (A) NOT DETECTED  ? ? ?Kevin [name] A ?04/16/2022  1:06 PM ? ?

## 2022-04-16 NOTE — Progress Notes (Addendum)
Pharmacy Antibiotic Note ? ?Kevin [name] is a 71 y.o. male admitted on 04/15/2022 with sepsis.  Pharmacy has  ?been consulted for vancomycin dosing. ? ?PMH is significant for hypertension, anemia, and malignant gastrointestinal stromal tumor (GIST) of stomach who presents with fatigue, myalgias, recent abdominal pain, and fever.   ? ?No weight entered in ED. Using listed weight of 71.kg from outpt visit from 03/15/2022 ? ? ?Plan: ?Cefepime 2 gr IV q8h  ?Metronidazole 500 mg IV q12h  ?Increase Vancomycin to 1250 mg IV q24h (eAUC 455 w/ SCr 0.9; Vd 0.72) ?Monitor clinical course, renal function, cultures as available ? ? ?  ? ?Temp (24hrs), Avg:98.6 ?F (37 ?C), Min:98 ?F (36.7 ?C), Max:99.3 ?F (37.4 ?C) ? ?Recent Labs  ?Lab 04/15/22 ?3546 04/15/22 ?1054 04/15/22 ?1320 04/16/22 ?0441  ?WBC 4.9  --   --  4.1  ?CREATININE 1.13  --   --  0.90  ?LATICACIDVEN 1.9 2.9* 1.5  --   ? ?  ?CrCl cannot be calculated (Unknown ideal weight.).   ? ?No Known Allergies ? ?Antimicrobials this admission: ?5/8  metronidazole >>  ?5/8 cefepime  >>  ?5/8 vancomycin >>  ? ?Dose adjustments this admission: ?5/9 incr vanc 1000 >> 1250 q24 ? ?Microbiology results: ?5/8 BCx: 1/4 MRSE (suspect contam) ?5/8 UCx:  insig growth ?  ? ?Thank you for allowing pharmacy to be a part of this patient?s care. ? ? ?Reuel Boom, PharmD, BCPS ?[phone] ?04/16/2022, 1:26 PM ? ? ? ?

## 2022-04-16 NOTE — Progress Notes (Addendum)
HEMATOLOGY-ONCOLOGY PROGRESS NOTE ? ?ASSESSMENT AND PLAN: ?GIST of the stomach ?CT abdomen/pelvis 01/21/2020-large necrotic mass centered in the posterior wall of the stomach, irregular hypodense mass right lobe liver  ?Upper endoscopy 01/24/2020-large infiltrative mass with no bleeding found in the posterior wall of the stomach.  Biopsy-gastrointestinal stromal tumor  ?MRI liver 01/28/2020-large exophytic centrally necrotic 17.3 x 12.3 cm gastric mass arising from posterior proximal stomach centered in the left upper quadrant with thick enhancing wall.  Complex multilocular 10.7 x 8.2 cm cystic liver mass right liver lobe.   ?Neoadjuvant Gleevec 01/28/2020 ?CT 03/03/2020, resolution of fluid component of right hepatic abscess, unchanged mass posterior to the gastric body, unchanged left periaortic and aortocaval adenopathy ?CT 06/01/2020-decreased size of a multilobulated mass arising from the posterior stomach, unchanged retroperitoneal lymph nodes, removal of liver drain resolution of abscess ?CT 09/14/20-no significant change in the large left upper quadrant mass arising from the proximal stomach.  Near complete resolution of right hepatic lobe abscess. ?CT 06/06/2021-no change in mass arising from the posterior gastric fundus, no lymphadenopathy or evidence of metastatic disease ?Patient discontinued Gleevec around September 2022 per his report ?CT 12/19/2021-significant interval increase in size of the large gastric mass which is partially necrotic, measures 16.5 x 14 cm compared to previous measurement of 9 x 7.6 cm.  Lumen of the stomach is significantly compressed.  Also mass-effect on the pancreas, spleen, left kidney and left adrenal gland.  No definite findings for direct invasion of these organs.  No findings of hepatic metastatic disease.  Stable small scattered mesenteric and retroperitoneal lymph nodes. ?12/25/2021 Gleevec 400 mg daily ?CT abdomen/pelvis 03/15/2022-interval 67% reduction in volume of the large  left upper quadrant GI stromal tumor, scattered small retroperitoneal lymphadenopathy similar to prior ?CT abdomen/pelvis 04/15/2022-large exophytic mass arising off the posterior wall of the stomach noted and compared to prior exam there has been a mild decrease in tumor volume, signs of internal necrosis, new gas identified within the dominant chronic component. ?2. Liver lesion in the right lobe ?            -Liver mass consistent with abscess on MRI of the liver from 01/27/2020 ?            -Status post CT-guided drainage of the liver abscess on 01/28/2020-culture positive for Streptococcus intermedius ?            -IV antibiotics and discharged home on 10-day course of Augmentin ?            -CT 03/03/2020-clearance of fluid component of liver abscess, unchanged mass posterior to the gastric body, stable left periaortic and aortocaval adenopathy ?            -Abscess drain removed 03/09/2020 ?            -Liver abscess resolved on CT 06/01/2020 and 09/14/2020 ?3. Iron deficiency anemia-oral iron,  resolved ?4. Gastritis and H. pylori infection ?5.  History of fever secondary to the liver abscess ?6. Protein calorie malnutrition-resolved ?7.  Hospital admission 04/15/2022-sepsis ? ?Kevin [name] has been admitted to the hospital for sepsis.  He presented with a fever of 105.  He has been started on IV antibiotics with improvement of his abdominal pain and nausea as well as fevers.  Cultures are currently pending.  CT of the abdomen/pelvis again showed his gastric mass which is decreased in volume.  There are signs of necrosis as well as gas in the dominant necrotic component.  The gas is nonspecific but  could represent a fistulous communication within the lumen of the stomach or superimposed infection of the necrotic components.  Recommend continuation of antibiotics and would consider general surgery consult. ? ?Continue to hold [place]. ? ?Recommendations: ?1.  Continue antibiotics per primary team. ?2.  Recommend surgical  consult. ?3.  Hold Gleevec ? ?Mikey Bussing, DNP, AGPCNP-BC, AOCNP ? ?Kevin [name] was interviewed and examined.  I reviewed the admission CT.  He is admitted with a high fever.  There is no apparent source for infection other than the necrotic gastric mass.  There is no evidence of disease progression on the admission CT. ? ?Kevin [name] has been maintained on [place] or treatment of the gastric GIST.  He has declined surgery in the past.  Surgery is the only potentially curative therapy.  It is okay to hold the [place] for now, but I will recommend resuming Gleevec within the next few days.  He appears to be tolerating the [place] well. ? ?I was present for greater than 50% today's visit.  I performed medical decision making. ?SUBJECTIVE: Kevin [name] is followed by our office for GIST of the stomach.  He has been receiving treatment with Gleevec.  He has had a good response to treatment.  He has been referred to general surgery in the past but did not proceed with surgery.  Now admitted with sepsis.  He presented with a temp of 105.  CT abdomen/pelvis performed in the emergency department showed a large exophytic mass arising from the posterior wall of the stomach compatible with his known GIST.  Compared to the prior exam, there has been a mild decrease in the tumor volume and there are signs of internal necrosis in this tumor.  There is also new gas identified within the dominant necrotic component.  He has been started on IV antibiotics.  Gleevec has been placed on hold.  No fevers overnight.  Pain and nausea improved. ? ?PHYSICAL EXAMINATION: ? ?Vitals:  ? 04/16/22 0209 04/16/22 0532  ?BP: 135/69 134/85  ?Pulse: 90 82  ?Resp: 19 19  ?Temp: 99.3 ?F (37.4 ?C) 98 ?F (36.7 ?C)  ?SpO2: 98% 97%  ? ?There were no vitals filed for this visit. ? ?Intake/Output from previous day: ?05/08 0701 - 05/09 0700 ?In: 3198.6 [P.O.:240; I.V.:2708.6; IV Piggyback:250] ?Out: 1725 [[id number]] ? ?Physical Exam ?Vitals reviewed.   ?HENT:  ?   Head: Normocephalic.  ?Eyes:  ?   General: No scleral icterus. ?   Conjunctiva/sclera: Conjunctivae normal.  ?Cardiovascular:  ?   Rate and Rhythm: Normal rate and regular rhythm.  ?Pulmonary:  ?   Effort: Pulmonary effort is normal. No respiratory distress.  ?   Breath sounds: Normal breath sounds.  ?Abdominal:  ?   Palpations: Abdomen is soft. There is mass.  ?Skin: ?   General: Skin is warm and dry.  ?Neurological:  ?   Mental Status: He is alert and oriented to person, place, and time.  ? ? ?LABORATORY DATA:  ?I have reviewed the data as listed ? ?  Latest Ref Rng & Units 04/16/2022  ?  4:41 AM 04/15/2022  ?  9:10 AM 03/15/2022  ? 11:09 AM  ?CMP  ?Glucose 70 - 99 mg/dL 116   203   105    ?BUN 8 - 23 mg/dL '15   20   21    '$ ?Creatinine 0.61 - 1.24 mg/dL 0.90   1.13   0.87    ?Sodium 135 - 145 mmol/L 136   136  138    ?Potassium 3.5 - 5.1 mmol/L 3.4   3.1   3.7    ?Chloride 98 - 111 mmol/L 108   107   104    ?CO2 22 - 32 mmol/L '22   20   26    '$ ?Calcium 8.9 - 10.3 mg/dL 7.2   7.7   9.0    ?Total Protein 6.5 - 8.1 g/dL 5.7   7.0   8.1    ?Total Bilirubin 0.3 - 1.2 mg/dL 0.8   0.9   0.4    ?Alkaline Phos 38 - 126 U/L 47   67   73    ?AST 15 - 41 U/L '17   18   14    '$ ?ALT 0 - 44 U/L '12   15   10    '$ ? ? ?Lab Results  ?Component Value Date  ? WBC 4.1 04/16/2022  ? HGB 8.4 (L) 04/16/2022  ? HCT 26.7 (L) 04/16/2022  ? MCV 86.7 04/16/2022  ? PLT 190 04/16/2022  ? NEUTROABS 3.3 04/16/2022  ? ? ?No results found for: CEA1, CEA, [id number], CA125, PSA1 ? ?CT ABDOMEN PELVIS W CONTRAST ? ?Result Date: 04/15/2022 ?CLINICAL DATA:  History of liver abscess. Weakness and fevers. Known GI stromal tumor on oral chemotherapy. EXAM: CT ABDOMEN AND PELVIS WITH CONTRAST TECHNIQUE: Multidetector CT imaging of the abdomen and pelvis was performed using the standard protocol following bolus administration of intravenous contrast. RADIATION DOSE REDUCTION: This exam was performed according to the departmental dose-optimization program which  includes automated exposure control, adjustment of the mA and/or kV according to patient size and/or use of iterative reconstruction technique. CONTRAST:  162m OMNIPAQUE IOHEXOL 300 MG/ML  SOLN COMPARISON:

## 2022-04-16 NOTE — Progress Notes (Signed)
?  Transition of Care (TOC) Screening Note ? ? ?Patient Details  ?Name: Kevin [name] ?Date of Birth: 02/26/1951 ? ? ?[facility]) CM/SW Contact:    ?Dessa Phi, RN ?Phone Number: ?04/16/2022, 3:32 PM ? ? ? ?Transition of Care Department [facility] has reviewed patient and no TOC needs have been identified at this time. We will continue to monitor patient advancement through interdisciplinary progression rounds. If new patient transition needs arise, please place a TOC consult. ?  ?

## 2022-04-17 DIAGNOSIS — D649 Anemia, unspecified: Secondary | ICD-10-CM

## 2022-04-17 DIAGNOSIS — E872 Acidosis, unspecified: Secondary | ICD-10-CM

## 2022-04-17 DIAGNOSIS — D72810 Lymphocytopenia: Secondary | ICD-10-CM

## 2022-04-17 DIAGNOSIS — C49A2 Gastrointestinal stromal tumor of stomach: Secondary | ICD-10-CM

## 2022-04-17 DIAGNOSIS — R6510 Systemic inflammatory response syndrome (SIRS) of non-infectious origin without acute organ dysfunction: Secondary | ICD-10-CM

## 2022-04-17 DIAGNOSIS — R7881 Bacteremia: Secondary | ICD-10-CM

## 2022-04-17 LAB — CULTURE, BLOOD (ROUTINE X 2): Special Requests: ADEQUATE

## 2022-04-17 LAB — COMPREHENSIVE METABOLIC PANEL
ALT: 16 U/L (ref 0–44)
AST: 26 U/L (ref 15–41)
Albumin: 2.3 g/dL — ABNORMAL LOW (ref 3.5–5.0)
Alkaline Phosphatase: 86 U/L (ref 38–126)
Anion gap: 5 (ref 5–15)
BUN: 13 mg/dL (ref 8–23)
CO2: 23 mmol/L (ref 22–32)
Calcium: 7.7 mg/dL — ABNORMAL LOW (ref 8.9–10.3)
Chloride: 108 mmol/L (ref 98–111)
Creatinine, Ser: 0.83 mg/dL (ref 0.61–1.24)
GFR, Estimated: >60 mL/min (ref >60)
Glucose, Bld: 111 mg/dL — ABNORMAL HIGH (ref 70–99)
Potassium: 3.9 mmol/L (ref 3.5–5.1)
Sodium: 136 mmol/L (ref 135–145)
Total Bilirubin: 0.5 mg/dL (ref 0.3–1.2)
Total Protein: 5.9 g/dL — ABNORMAL LOW (ref 6.5–8.1)

## 2022-04-17 LAB — CBC WITH DIFFERENTIAL/PLATELET
Abs Immature Granulocytes: 0.01 10*3/uL (ref 0.00–0.07)
Basophils Absolute: 0 10*3/uL (ref 0.0–0.1)
Basophils Relative: 0 %
Eosinophils Absolute: 0.2 10*3/uL (ref 0.0–0.5)
Eosinophils Relative: 5 %
HCT: 28.9 % — ABNORMAL LOW (ref 39.0–52.0)
Hemoglobin: 9 g/dL — ABNORMAL LOW (ref 13.0–17.0)
Immature Granulocytes: 0 %
Lymphocytes Relative: 12 %
Lymphs Abs: 0.4 10*3/uL — ABNORMAL LOW (ref 0.7–4.0)
MCH: 27.2 pg (ref 26.0–34.0)
MCHC: 31.1 g/dL (ref 30.0–36.0)
MCV: 87.3 fL (ref 80.0–100.0)
Monocytes Absolute: 0.4 10*3/uL (ref 0.1–1.0)
Monocytes Relative: 10 %
Neutro Abs: 2.7 10*3/uL (ref 1.7–7.7)
Neutrophils Relative %: 73 %
Platelets: 200 10*3/uL (ref 150–400)
RBC: 3.31 MIL/uL — ABNORMAL LOW (ref 4.22–5.81)
RDW: 15.9 % — ABNORMAL HIGH (ref 11.5–15.5)
WBC: 3.7 10*3/uL — ABNORMAL LOW (ref 4.0–10.5)
nRBC: 0 % (ref 0.0–0.2)

## 2022-04-17 MED ORDER — IMATINIB MESYLATE 100 MG PO TABS
400.0000 mg | ORAL_TABLET | Freq: Every day | ORAL | Status: DC
  Administered 2022-04-18: 400 mg via ORAL
  Filled 2022-04-17: qty 4

## 2022-04-17 NOTE — Progress Notes (Signed)
?PROGRESS NOTE ? ?Kevin [name] UXL:[id number] DOB: 1951/08/26  ? ?PCP: Ladell Pier, MD ? ?Patient is from: Home.  Lives with family. ? ?DOA: 04/15/2022 LOS: 2 ? ?Chief complaints ?Chief Complaint  ?Patient presents with  ? Weakness  ? Fever  ?  ? ?Brief Narrative / Interim history: ?71 year old Spanish-speaking male with PMH of GIST of stomach, tobacco use disorder, HTN and anemia presenting with weakness, fever, chills, abdominal pain and poor p.o. intake and admitted for possible sepsis of undetermined organism or source.  He had a temp of 105.1 on the day of admission but had no leukocytosis or clear source of infection.  He was started on broad-spectrum antibiotics with Vanco, cefepime and Flagyl.  However, source of infection remains unclear.  Urine culture with insignificant growth.  Blood culture with Staph epidermidis in 1 out of 2 bottles likely contaminant.  Patient's symptoms improved.  He remained afebrile.  Antibiotics discontinued.  Oncology was consulted and recommended surgical consult.  However, surgery recommended outpatient follow-up.  Oncology resumed his [place].  ? ?Patient could be discharged home on 5/11 if remains stable off antibiotics  ? ?Subjective: ?Seen and examined earlier this morning with the help of video interpreter with ID number [id number].  No major events overnight of this morning.  No complaints.  He denies chest pain, dyspnea, cough, nausea, vomiting, abdominal pain or diarrhea.  He denies UTI symptoms. ? ?Objective: ?Vitals:  ? 04/16/22 2041 04/17/22 0609 04/17/22 0609 04/17/22 1305  ?BP: 132/73 138/85 138/85 (!) 175/88  ?Pulse: 82 84 82 77  ?Resp: '18 18 18 18  '$ ?Temp: 98.9 ?F (37.2 ?C) 98.4 ?F (36.9 ?C) 98.4 ?F (36.9 ?C) 98 ?F (36.7 ?C)  ?TempSrc: Oral Oral Oral Oral  ?SpO2: 98% 98% 98% 99%  ? ? ?Examination: ? ?GENERAL: No apparent distress.  Nontoxic. ?HEENT: MMM.  Vision and hearing grossly intact.  ?NECK: Supple.  No apparent JVD.  ?RESP:  No IWOB.  Fair aeration  bilaterally. ?CVS:  RRR. Heart sounds normal.  ?ABD/GI/GU: BS+. Abd soft, NTND.  ?MSK/EXT:  Moves extremities. No apparent deformity. No edema.  ?SKIN: no apparent skin lesion or wound ?NEURO: Awake, alert and oriented appropriately.  No apparent focal neuro deficit. ?PSYCH: Calm. Normal affect.  ? ?Procedures:  ?None ? ?Microbiology summarized: ?COVID-19 and influenza PCR nonreactive. ?Urine culture with insignificant growth. ?Blood culture with Staph epidermidis in 1 out of 2 bottles likely contaminant. ? ?Assessment and Plan: ?* SIRS (systemic inflammatory response syndrome) (HCC) ?No clear source of infection.  Urine culture with insignificant growth.  Blood culture with Streptococcus epidermidis in 1 out of 2 bottles likely contaminant.  Remains afebrile since the day of admission.  Sepsis ruled out. ?-Monitor off antibiotics ? ?Malignant gastrointestinal stromal tumor (GIST) of stomach ([place]) ?Oncology resumed [place] ?General surgery recommended outpatient follow-up ? ?Positive blood culture ?Staph epidermidis in 1 out of 2 bottles likely contaminant.  Antibiotics discontinued. ? ?Lymphocytopenia ?Due to [place]? ?-Continue monitoring ? ?Lactic acidosis ?Likely dehydration versus sepsis.  Resolved. ? ?Elevated troponin ?Troponin 20>> 79.  Patient without chest pain.  EKG without acute ischemic finding.  Doubt need for further evaluation ? ?Hypocalcemia ?Corrects to normal for hypoalbuminemia ? ?Hypokalemia ?Resolved. ? ?Essential hypertension ?Resume home amlodipine. ? ?Normocytic anemia ?Recent Labs  ?  04/23/21 ?1115 06/06/21 ?1057 12/19/21 ?0904 01/25/22 ?0808 02/15/22 ?1008 03/15/22 ?1109 04/15/22 ?5366 04/16/22 ?0441 04/17/22 ?0446  ?HGB 12.9* 14.6 11.1* 11.0* 11.9* 11.1* 9.9* 8.4* 9.0*  ?H&H stable after initial drop.  Continue monitoring ? ? ? ?DVT prophylaxis:  ?SCDs Start: 04/15/22 1227 ? ?Code Status: Full code ?Family Communication: Patient and/or RN. Available if any question.  ?Level of care:  Progressive.  Change level of care to MedSurg ?Status is: Inpatient ?Remains inpatient appropriate because: SIRS/GIST.  Monitoring off antibiotics. ? ? ?Final disposition: Likely home ?Consultants:  ?Oncology ?General surgery ? ?Sch Meds:  ?Scheduled Meds: ? [START ON 04/18/2022] imatinib  400 mg Oral Q breakfast  ? ?Continuous Infusions: ?PRN Meds:.acetaminophen **OR** acetaminophen, ondansetron **OR** ondansetron (ZOFRAN) IV ? ?Antimicrobials: ?Anti-infectives (From admission, onward)  ? ? Start     Dose/Rate Route Frequency Ordered Stop  ? 04/17/22 0600  vancomycin (VANCOREADY) IVPB 1250 mg/250 mL  Status:  Discontinued       ? 1,250 mg ?166.7 mL/hr over 90 Minutes Intravenous Every 24 hours 04/16/22 1325 04/17/22 0836  ? 04/16/22 1000  vancomycin (VANCOCIN) IVPB 1000 mg/200 mL premix  Status:  Discontinued       ? 1,000 mg ?200 mL/hr over 60 Minutes Intravenous Every 24 hours 04/15/22 1401 04/16/22 1325  ? 04/15/22 2200  metroNIDAZOLE (FLAGYL) IVPB 500 mg  Status:  Discontinued       ? 500 mg ?100 mL/hr over 60 Minutes Intravenous Every 12 hours 04/15/22 1316 04/17/22 0836  ? 04/15/22 1800  ceFEPIme (MAXIPIME) 2 g in sodium chloride 0.9 % 100 mL IVPB  Status:  Discontinued       ?Note to Pharmacy: Adjust dose as needed please. TY.  ? 2 g ?200 mL/hr over 30 Minutes Intravenous Every 8 hours 04/15/22 1316 04/17/22 0836  ? 04/15/22 0915  ceFEPIme (MAXIPIME) 2 g in sodium chloride 0.9 % 100 mL IVPB       ? 2 g ?200 mL/hr over 30 Minutes Intravenous  Once 04/15/22 0907 04/15/22 1007  ? 04/15/22 0915  metroNIDAZOLE (FLAGYL) IVPB 500 mg       ? 500 mg ?100 mL/hr over 60 Minutes Intravenous  Once 04/15/22 0907 04/15/22 1110  ? 04/15/22 0915  vancomycin (VANCOCIN) IVPB 1000 mg/200 mL premix       ? 1,000 mg ?200 mL/hr over 60 Minutes Intravenous  Once 04/15/22 0907 04/15/22 1032  ? ?  ? ? ? ?I have personally reviewed the following labs and images: ?CBC: ?Recent Labs  ?Lab 04/15/22 ?5397 04/16/22 ?0441 04/17/22 ?0446   ?WBC 4.9 4.1 3.7*  ?NEUTROABS 4.6 3.3 2.7  ?HGB 9.9* 8.4* 9.0*  ?HCT 30.4* 26.7* 28.9*  ?MCV 84.9 86.7 87.3  ?PLT 241 190 200  ? ?BMP &GFR ?Recent Labs  ?Lab 04/15/22 ?6734 04/16/22 ?0441 04/17/22 ?0446  ?NA 136 136 136  ?K 3.1* 3.4* 3.9  ?CL 107 108 108  ?CO2 20* 22 23  ?GLUCOSE 203* 116* 111*  ?BUN '20 15 13  '$ ?CREATININE 1.13 0.90 0.83  ?CALCIUM 7.7* 7.2* 7.7*  ?MG 1.9  --   --   ? ?CrCl cannot be calculated (Unknown ideal weight.). ?Liver & Pancreas: ?Recent Labs  ?Lab 04/15/22 ?1937 04/16/22 ?0441 04/17/22 ?0446  ?AST '18 17 26  '$ ?ALT '15 12 16  '$ ?ALKPHOS 67 47 86  ?BILITOT 0.9 0.8 0.5  ?PROT 7.0 5.7* 5.9*  ?ALBUMIN 2.9* 2.2* 2.3*  ? ?Recent Labs  ?Lab 04/15/22 ?0910  ?LIPASE 23  ? ?Recent Labs  ?Lab 04/15/22 ?0910  ?AMMONIA 12  ? ?Diabetic: ?No results for input(s): HGBA1C in the last 72 hours. ?Recent Labs  ?Lab 04/15/22 ?0906 04/15/22 ?2151  ?GLUCAP 214* 144*  ? ?Cardiac  Enzymes: ?Recent Labs  ?Lab 04/15/22 ?0910  ?CKTOTAL 100  ? ?No results for input(s): PROBNP in the last 8760 hours. ?Coagulation Profile: ?Recent Labs  ?Lab 04/15/22 ?0910  ?INR 1.1  ? ?Thyroid Function Tests: ?Recent Labs  ?  04/15/22 ?0910  ?TSH 0.784  ? ?Lipid Profile: ?No results for input(s): CHOL, HDL, LDLCALC, TRIG, CHOLHDL, LDLDIRECT in the last 72 hours. ?Anemia Panel: ?No results for input(s): VITAMINB12, FOLATE, FERRITIN, TIBC, IRON, RETICCTPCT in the last 72 hours. ?Urine analysis: ?   ?Component Value Date/Time  ? [place] YELLOW 04/15/2022 0904  ? APPEARANCEUR CLEAR 04/15/2022 0904  ? LABSPEC 1.014 04/15/2022 0904  ? PHURINE 5.0 04/15/2022 0904  ? GLUCOSEU NEGATIVE 04/15/2022 0904  ? HGBUR MODERATE (A) 04/15/2022 0904  ? [place] NEGATIVE 04/15/2022 0904  ? KETONESUR 5 (A) 04/15/2022 0904  ? [place] NEGATIVE 04/15/2022 0904  ? NITRITE NEGATIVE 04/15/2022 0904  ? LEUKOCYTESUR NEGATIVE 04/15/2022 0904  ? ?Sepsis Labs: ?Invalid input(s): PROCALCITONIN, LACTICIDVEN ? ?Microbiology: ?Recent Results (from the past 240 hour(s))   ?Urine Culture     Status: Abnormal  ? Collection Time: 04/15/22  9:04 AM  ? Specimen: In/Out Cath Urine  ?Result Value Ref Range Status  ? Specimen Description   Final  ?  IN/OUT CATH URINE ?Performed at [facility]

## 2022-04-17 NOTE — Assessment & Plan Note (Signed)
Likely dehydration versus sepsis.  Resolved. ?

## 2022-04-17 NOTE — Assessment & Plan Note (Signed)
Resolved

## 2022-04-17 NOTE — Assessment & Plan Note (Signed)
Staph epidermidis in 1 out of 2 bottles likely contaminant.  Antibiotics discontinued. ?

## 2022-04-17 NOTE — Assessment & Plan Note (Addendum)
Recent Labs  ?  04/23/21 ?1115 06/06/21 ?1057 12/19/21 ?0904 01/25/22 ?0808 02/15/22 ?1008 03/15/22 ?1109 04/15/22 ?5521 04/16/22 ?0441 04/17/22 ?0446 04/18/22 ?0512  ?HGB 12.9* 14.6 11.1* 11.0* 11.9* 11.1* 9.9* 8.4* 9.0* 9.7*  ?H&H stable after initial drop.  Continue monitoring ? ?

## 2022-04-17 NOTE — Assessment & Plan Note (Deleted)
Nutrition Status: ?  ?  ?  ?Consult dietitian ?

## 2022-04-17 NOTE — Assessment & Plan Note (Signed)
Troponin 20>> 79.  Patient without chest pain.  EKG without acute ischemic finding.  Doubt need for further evaluation ?

## 2022-04-17 NOTE — Consult Note (Signed)
Reason for Consult:sepsis ?Referring Physician: Dr. Cyndia Skeeters ? ?Kevin [name] is an 71 y.o. male.  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 The tumor was large. He was started on Gleevac. It is not clear how long he took it but there is a note from oncology that he stopped in September. He saw Dr. Zenia Resides last year and was recommended for surgery. The patient never scheduled. He was recently admitted with fever of 105. This has resolved. He is now afebrile with normal BP and HR and normal wbc.  ? ?Past Medical History:  ?Diagnosis Date  ? Anemia   ? Hypertension   ? 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 ? ?Past Surgical History:  ?Procedure Laterality Date  ? BIOPSY  01/24/2020  ? Procedure: BIOPSY;  Surgeon: Wonda Horner, MD;  Location[facility] ENDOSCOPY;  Service: Endoscopy;;  ? ESOPHAGOGASTRODUODENOSCOPY (EGD) WITH PROPOFOL N/A 01/24/2020  ? Procedure: ESOPHAGOGASTRODUODENOSCOPY (EGD) WITH PROPOFOL;  Surgeon: Wonda Horner, MD;  [facility] END[facility]COPY;  Service: Endoscopy;  Laterality: N/A;  ? IR CATHETER TUBE CHANGE  02/18/2020  ? IR RADIOLOGIST EVAL & MGMT  02/15/2020  ? IR RADIOLOGIST EVAL & MGMT  03/09/2020  ? SCLEROTHERAPY  01/24/2020  ? Procedure: SCLEROTHERAPY;  Surgeon: Wonda Horner, MD;  Location: [facility] ENDOSCOPY;  Service: Endoscopy;;  ? ? ?Family History  ?Problem Relation Age of Onset  ? Hypertension Other   ? ? ?Social History:  reports that he quit smoking about 25 years ago. His smoking use included cigarettes. He has never used smokeless tobacco. He reports that he does not currently use alcohol. He reports that he does not currently use drugs. ? ?Allergies: No Known Allergies ? ?Medications: I have reviewed the patient's current medications. ? ?Results for orders placed or performed during the hospital encounter of 04/15/22 (from the past 48 hour(s))  ?Lactic acid, plasma     Status: None  ?  Collection Time: 04/15/22  1:20 PM  ?Result Value Ref Range  ? Lactic Acid, Venous 1.5 0.5 - 1.9 mmol/L  ?  Comment: Performed at [facility], [place] [address], [place], [place] [address]  ?Glucose, capillary     Status: Abnormal  ? Collection Time: 04/15/22  9:51 PM  ?Result Value Ref Range  ? Glucose-Capillary 144 (H) 70 - 99 mg/dL  ?  Comment: Glucose reference range applies only to samples taken after fasting for at least 8 hours.  ?CBC with Differential     Status: Abnormal  ? Collection Time: 04/16/22  4:41 AM  ?Result Value Ref Range  ? WBC 4.1 4.0 - 10.5 K/uL  ? RBC 3.08 (L) 4.22 - 5.81 MIL/uL  ? Hemoglobin 8.4 (L) 13.0 - 17.0 g/dL  ? HCT 26.7 (L) 39.0 - 52.0 %  ? MCV 86.7 80.0 - 100.0 fL  ? MCH 27.3 26.0 - 34.0 pg  ? MCHC 31.5 30.0 - 36.0 g/dL  ? RDW 16.1 (H) 11.5 - 15.5 %  ? Platelets 190 150 - 400 K/uL  ? nRBC 0.0 0.0 - 0.2 %  ? Neutrophils Relative % 80 %  ? Neutro Abs 3.3 1.7 - 7.7 K/uL  ? Lymphocytes Relative 10 %  ? Lymphs Abs 0.4 (L) 0.7 - 4.0 K/uL  ? Monocytes Relative 7 %  ? Monocytes Absolute 0.3 0.1 - 1.0 K/uL  ? Eosinophils Relative 2 %  ?  Eosinophils Absolute 0.1 0.0 - 0.5 K/uL  ? Basophils Relative 0 %  ? Basophils Absolute 0.0 0.0 - 0.1 K/uL  ? Immature Granulocytes 1 %  ? Abs Immature Granulocytes 0.03 0.00 - 0.07 K/uL  ?  Comment: Performed at [facility], [place] [address] [place], [place] [address]  ?Comprehensive metabolic panel     Status: Abnormal  ? Collection Time: 04/16/22  4:41 AM  ?Result Value Ref Range  ? Sodium 136 135 - 145 mmol/L  ? Potassium 3.4 (L) 3.5 - 5.1 mmol/L  ? Chloride 108 98 - 111 mmol/L  ? CO2 22 22 - 32 mmol/L  ? Glucose, Bld 116 (H) 70 - 99 mg/dL  ?  Comment: Glucose reference range applies only to samples taken after fasting for at least 8 hours.  ? BUN 15 8 - 23 mg/dL  ? Creatinine, Ser 0.90 0.61 - 1.24 mg/dL  ? Calcium 7.2 (L) 8.9 - 10.3 mg/dL  ? Total Protein 5.7 (L) 6.5 - 8.1 g/dL  ? Albumin 2.2 (L) 3.5 - 5.0 g/dL  ? AST  17 15 - 41 U/L  ? ALT 12 0 - 44 U/L  ? Alkaline Phosphatase 47 38 - 126 U/L  ? Total Bilirubin 0.8 0.3 - 1.2 mg/dL  ? GFR, Estimated >60 >60 mL/min  ?  Comment: (NOTE) ?Calculated using the CKD-EPI Creatinine Equation (2021) ?  ? Anion gap 6 5 - 15  ?  Comment: Performed at [facility], [place] [address] [place], [place] [address]  ?CBC with Differential     Status: Abnormal  ? Collection Time: 04/17/22  4:46 AM  ?Result Value Ref Range  ? WBC 3.7 (L) 4.0 - 10.5 K/uL  ? RBC 3.31 (L) 4.22 - 5.81 MIL/uL  ? Hemoglobin 9.0 (L) 13.0 - 17.0 g/dL  ? HCT 28.9 (L) 39.0 - 52.0 %  ? MCV 87.3 80.0 - 100.0 fL  ? MCH 27.2 26.0 - 34.0 pg  ? MCHC 31.1 30.0 - 36.0 g/dL  ? RDW 15.9 (H) 11.5 - 15.5 %  ? Platelets 200 150 - 400 K/uL  ? nRBC 0.0 0.0 - 0.2 %  ? Neutrophils Relative % 73 %  ? Neutro Abs 2.7 1.7 - 7.7 K/uL  ? Lymphocytes Relative 12 %  ? Lymphs Abs 0.4 (L) 0.7 - 4.0 K/uL  ? Monocytes Relative 10 %  ? Monocytes Absolute 0.4 0.1 - 1.0 K/uL  ? Eosinophils Relative 5 %  ? Eosinophils Absolute 0.2 0.0 - 0.5 K/uL  ? Basophils Relative 0 %  ? Basophils Absolute 0.0 0.0 - 0.1 K/uL  ? Immature Granulocytes 0 %  ? Abs Immature Granulocytes 0.01 0.00 - 0.07 K/uL  ?  Comment: Performed at [facility], [place] [address] [place], [place] [address]  ?Comprehensive metabolic panel     Status: Abnormal  ? Collection Time: 04/17/22  4:46 AM  ?Result Value Ref Range  ? Sodium 136 135 - 145 mmol/L  ? Potassium 3.9 3.5 - 5.1 mmol/L  ? Chloride 108 98 - 111 mmol/L  ? CO2 23 22 - 32 mmol/L  ? Glucose, Bld 111 (H) 70 - 99 mg/dL  ?  Comment: Glucose reference range applies only to samples taken after fasting for at least 8 hours.  ? BUN 13 8 - 23 mg/dL  ? Creatinine, Ser 0.83 0.61 - 1.24 mg/dL  ? Calcium 7.7 (L) 8.9 - 10.3 mg/dL  ? Total Protein 5.9 (L) 6.5 -  8.1 g/dL  ? Albumin 2.3 (L) 3.5 - 5.0 g/dL  ? AST 26 15 - 41 U/L  ? ALT 16 0 - 44 U/L  ? Alkaline Phosphatase 86 38 - 126 U/L  ? Total Bilirubin 0.5 0.3 -  1.2 mg/dL  ? GFR, Estimated >60 >60 mL/min  ?  Comment: (NOTE) ?Calculated using the CKD-EPI Creatinine Equation (2021) ?  ? Anion gap 5 5 - 15  ?  Comment: Performed at [facility], [place] [address], [place], [place] [address]  ? ? ?No results found. ? ?Review of Systems  ?Constitutional:  Positive for chills.  ?HENT: Negative.    ?Eyes: Negative.   ?Respiratory: Negative.    ?Cardiovascular: Negative.   ?Gastrointestinal:  Positive for abdominal pain.  ?Endocrine: Negative.   ?Genitourinary: Negative.   ?Musculoskeletal: Negative.   ?Skin: Negative.   ?Allergic/Immunologic: Negative.   ?Neurological:  Positive for dizziness.  ?Hematological: Negative.   ?Psychiatric/Behavioral: Negative.    ?Blood pressure 138/85, pulse 82, temperature 98.4 ?F (36.9 ?C), temperature source Oral, resp. rate 18, SpO2 98 %. ?Physical Exam ?Vitals reviewed.  ?Constitutional:   ?   General: He is not in acute distress. ?   Appearance: Normal appearance.  ?HENT:  ?   Head: Normocephalic and atraumatic.  ?   Right Ear: External ear normal.  ?   Left Ear: External ear normal.  ?   Nose: Nose normal.  ?   Mouth/Throat:  ?   Mouth: Mucous membranes are moist.  ?   Pharynx: Oropharynx is clear.  ?Eyes:  ?   General: No scleral icterus. ?   Extraocular Movements: Extraocular movements intact.  ?   Conjunctiva/sclera: Conjunctivae normal.  ?   Pupils: Pupils are equal, round, and reactive to light.  ?Cardiovascular:  ?   Rate and Rhythm: Normal rate and regular rhythm.  ?   Pulses: Normal pulses.  ?   Heart sounds: Normal heart sounds.  ?Pulmonary:  ?   Effort: Pulmonary effort is normal. No respiratory distress.  ?   Breath sounds: Normal breath sounds.  ?Abdominal:  ?   General: Abdomen is flat. Bowel sounds are normal.  ?   Palpations: Abdomen is soft.  ?   Tenderness: There is no abdominal tenderness.  ?Musculoskeletal:     ?   General: No swelling or deformity. Normal range of motion.  ?   Cervical back: Normal range of  motion. No tenderness.  ?Skin: ?   General: Skin is warm and dry.  ?   Coloration: Skin is not jaundiced.  ?Neurological:  ?   General: No focal deficit present.  ?   Mental Status: He is alert and ori

## 2022-04-17 NOTE — Assessment & Plan Note (Addendum)
Due to [place]?  Seems to have resolved. ?

## 2022-04-17 NOTE — Progress Notes (Signed)
HEMATOLOGY-ONCOLOGY PROGRESS NOTE ? ?ASSESSMENT AND PLAN: ?GIST of the stomach ?CT abdomen/pelvis 01/21/2020-large necrotic mass centered in the posterior wall of the stomach, irregular hypodense mass right lobe liver  ?Upper endoscopy 01/24/2020-large infiltrative mass with no bleeding found in the posterior wall of the stomach.  Biopsy-gastrointestinal stromal tumor  ?MRI liver 01/28/2020-large exophytic centrally necrotic 17.3 x 12.3 cm gastric mass arising from posterior proximal stomach centered in the left upper quadrant with thick enhancing wall.  Complex multilocular 10.7 x 8.2 cm cystic liver mass right liver lobe.   ?Neoadjuvant Gleevec 01/28/2020 ?CT 03/03/2020, resolution of fluid component of right hepatic abscess, unchanged mass posterior to the gastric body, unchanged left periaortic and aortocaval adenopathy ?CT 06/01/2020-decreased size of a multilobulated mass arising from the posterior stomach, unchanged retroperitoneal lymph nodes, removal of liver drain resolution of abscess ?CT 09/14/20-no significant change in the large left upper quadrant mass arising from the proximal stomach.  Near complete resolution of right hepatic lobe abscess. ?CT 06/06/2021-no change in mass arising from the posterior gastric fundus, no lymphadenopathy or evidence of metastatic disease ?Patient discontinued Gleevec around September 2022 per his report ?CT 12/19/2021-significant interval increase in size of the large gastric mass which is partially necrotic, measures 16.5 x 14 cm compared to previous measurement of 9 x 7.6 cm.  Lumen of the stomach is significantly compressed.  Also mass-effect on the pancreas, spleen, left kidney and left adrenal gland.  No definite findings for direct invasion of these organs.  No findings of hepatic metastatic disease.  Stable small scattered mesenteric and retroperitoneal lymph nodes. ?12/25/2021 Gleevec 400 mg daily ?CT abdomen/pelvis 03/15/2022-interval 67% reduction in volume of the large  left upper quadrant GI stromal tumor, scattered small retroperitoneal lymphadenopathy similar to prior ?CT abdomen/pelvis 04/15/2022-large exophytic mass arising off the posterior wall of the stomach noted and compared to prior exam there has been a mild decrease in tumor volume, signs of internal necrosis, new gas identified within the dominant chronic component. ?2. Liver lesion in the right lobe ?            -Liver mass consistent with abscess on MRI of the liver from 01/27/2020 ?            -Status post CT-guided drainage of the liver abscess on 01/28/2020-culture positive for Streptococcus intermedius ?            -IV antibiotics and discharged home on 10-day course of Augmentin ?            -CT 03/03/2020-clearance of fluid component of liver abscess, unchanged mass posterior to the gastric body, stable left periaortic and aortocaval adenopathy ?            -Abscess drain removed 03/09/2020 ?            -Liver abscess resolved on CT 06/01/2020 and 09/14/2020 ?3. Iron deficiency anemia-oral iron,  resolved ?Anemia on hospital admission 04/15/2022-likely secondary to sepsis and phlebotomy ?4. Gastritis and H. pylori infection ?5.  History of fever secondary to the liver abscess ?6. Protein calorie malnutrition-resolved ?7.  Hospital admission 04/15/2022-sepsis, blood culture positive for Staph epidermidis ? ?Kevin [name] appears stable.  He remains afebrile.  The Staph epidermidis may be a skin contaminant.  There is no apparent source for infection other than the chronic gastric mass. ? ?I discussed treatment options for the gastrointestinal stromal tumor with Kevin [name].  A Spanish interpreter was utilized for our discussion today.  I explained the only potentially curative therapy is  surgical resection of the gastric mass.  Kevin [name] indicated he is willing to discuss the surgical option.  He has previously seen Dr. Barry Dienes and Dr. Zenia Resides. ? ?Recommendations: ?1.  Continue antibiotics per primary team. ?2.  Recommend  surgical consult, Kevin [name] has been evaluated in the past by Dr. Barry Dienes and Dr. Zenia Resides ?3.  Resume Gleevec ? ?SUBJECTIVE: Kevin [name] has no new complaint.  No pain. ? ?PHYSICAL EXAMINATION: ? ?Vitals:  ? 04/17/22 0609 04/17/22 0609  ?BP: 138/85 138/85  ?Pulse: 84 82  ?Resp: 18 18  ?Temp: 98.4 ?F (36.9 ?C) 98.4 ?F (36.9 ?C)  ?SpO2: 98% 98%  ? ?There were no vitals filed for this visit. ? ?Intake/Output from previous day: ?05/09 0701 - 05/10 0700 ?In: 971.2 [P.O.:241; IV Piggyback:730.2] ?Out: [place] [JQBHA:1937] ? ?Physical examination: ?Abdomen-soft, mild tenderness in the left upper abdomen, palpable mass on deep palpation at the left subcostal region.  No hepatomegaly. ?Vascular: No leg edema ?HEENT: No thrush ? ?LABORATORY DATA:  ?I have reviewed the data as listed ? ?  Latest Ref Rng & Units 04/17/2022  ?  4:46 AM 04/16/2022  ?  4:41 AM 04/15/2022  ?  9:10 AM  ?CMP  ?Glucose 70 - 99 mg/dL 111   116   203    ?BUN 8 - 23 mg/dL '13   15   20    '$ ?Creatinine 0.61 - 1.24 mg/dL 0.83   0.90   1.13    ?Sodium 135 - 145 mmol/L 136   136   136    ?Potassium 3.5 - 5.1 mmol/L 3.9   3.4   3.1    ?Chloride 98 - 111 mmol/L 108   108   107    ?CO2 22 - 32 mmol/L '23   22   20    '$ ?Calcium 8.9 - 10.3 mg/dL 7.7   7.2   7.7    ?Total Protein 6.5 - 8.1 g/dL 5.9   5.7   7.0    ?Total Bilirubin 0.3 - 1.2 mg/dL 0.5   0.8   0.9    ?Alkaline Phos 38 - 126 U/L 86   47   67    ?AST 15 - 41 U/L '26   17   18    '$ ?ALT 0 - 44 U/L '16   12   15    '$ ? ? ?Lab Results  ?Component Value Date  ? WBC 3.7 (L) 04/17/2022  ? HGB 9.0 (L) 04/17/2022  ? HCT 28.9 (L) 04/17/2022  ? MCV 87.3 04/17/2022  ? PLT 200 04/17/2022  ? NEUTROABS 2.7 04/17/2022  ? ? ?No results found for: CEA1, CEA, [id number], CA125, PSA1 ? ?CT ABDOMEN PELVIS W CONTRAST ? ?Result Date: 04/15/2022 ?CLINICAL DATA:  History of liver abscess. Weakness and fevers. Known GI stromal tumor on oral chemotherapy. EXAM: CT ABDOMEN AND PELVIS WITH CONTRAST TECHNIQUE: Multidetector CT imaging of the abdomen and  pelvis was performed using the standard protocol following bolus administration of intravenous contrast. RADIATION DOSE REDUCTION: This exam was performed according to the departmental dose-optimization program which includes automated exposure control, adjustment of the mA and/or kV according to patient size and/or use of iterative reconstruction technique. CONTRAST:  173m OMNIPAQUE IOHEXOL 300 MG/ML  SOLN COMPARISON:  03/15/2022 FINDINGS: Lower chest: Mild dependent changes noted within the posterior lung bases. No pleural effusion or airspace consolidation. Hepatobiliary: No focal liver abnormality is seen. No gallstones, gallbladder wall thickening, or biliary dilatation. Pancreas: No signs of pancreatic inflammation,  mass or main duct dilatation. Mass arising off the posterior stomach has mass effect upon the tail of pancreas as noted previously. Spleen: Normal in size without focal abnormality. Adrenals/Urinary Tract: Normal appearance of the adrenal glands. Mild bilateral perinephric haziness is identified which appears new from previous exam and may be seen in the setting of pyelonephritis. No hydronephrosis identified. 3 cm Bosniak class 1 cyst arises off the lateral cortex of the upper pole of left kidney. Inferior pole right kidney scarring is unchanged. Bladder is unremarkable. Stomach/Bowel: Large exophytic mass arising off the posterior wall of the stomach is again noted. This measures 11.4 x 9.6 by 10.9 cm (volume = 620 cm^3), image 18/2 and image 63/5. Previously this measured 12.4 by 9.0 by 12.0 cm (volume = 700 cm^3). There are several necrotic components within this mass as noted previously. The largest necrotic component measures 8.1 x 4.9 cm, image 21/2. Previously 7.8 by 5.7 cm. Gas is now seen within this dominant component scratch set multiple foci of gas are now seen within this necrotic component. Presence of gas is nonspecific and may be seen in the setting of tumor necrosis. Fistulous  communication of the necrotic component within the lumen of the stomach could also explain this finding. Superimposed infection of the necrotic component would be difficult to exclude with a high degree of ce

## 2022-04-17 NOTE — Assessment & Plan Note (Addendum)
Resume home amlodipine and lisinopril. ?

## 2022-04-17 NOTE — Assessment & Plan Note (Signed)
Corrects to normal for hypoalbuminemia ?

## 2022-04-17 NOTE — Assessment & Plan Note (Signed)
Oncology resumed [place] ?General surgery recommended outpatient follow-up ?

## 2022-04-17 NOTE — Assessment & Plan Note (Addendum)
No clear source of infection.  Urine culture with insignificant growth.  Blood culture with Streptococcus epidermidis in 1 out of 2 bottles likely contaminant. Sepsis ruled out.  Antibiotics discontinued and he remained stable off antibiotics.  SIRS could be due to his malignancy and chemo. ?

## 2022-04-17 NOTE — Hospital Course (Addendum)
71 year old Spanish-speaking male with PMH of GIST of stomach, tobacco use disorder, HTN and anemia presenting with weakness, fever, chills, abdominal pain and poor p.o. intake and admitted for possible sepsis of undetermined organism or source.  He had a temp of 105.1 on the day of admission but had no leukocytosis or clear source of infection.  He was started on broad-spectrum antibiotics with Vanco, cefepime and Flagyl.  However, source of infection remains unclear.  Urine culture with insignificant growth.  Blood culture with Staph epidermidis in 1 out of 2 bottles likely contaminant.  Patient's symptoms improved.  He remained afebrile.  Antibiotics discontinued.  Oncology was consulted and recommended surgical consult.  However, surgery recommended outpatient follow-up.  Oncology resumed his [place].  ? ?Patient remained stable off antibiotics.  Symptoms resolved.  Discharged home to follow-up with his oncologist and general surgery. ?

## 2022-04-18 LAB — CBC
HCT: 30.5 % — ABNORMAL LOW (ref 39.0–52.0)
Hemoglobin: 9.7 g/dL — ABNORMAL LOW (ref 13.0–17.0)
MCH: 27.3 pg (ref 26.0–34.0)
MCHC: 31.8 g/dL (ref 30.0–36.0)
MCV: 85.9 fL (ref 80.0–100.0)
Platelets: 230 10*3/uL (ref 150–400)
RBC: 3.55 MIL/uL — ABNORMAL LOW (ref 4.22–5.81)
RDW: 15.6 % — ABNORMAL HIGH (ref 11.5–15.5)
WBC: 4.5 10*3/uL (ref 4.0–10.5)
nRBC: 0 % (ref 0.0–0.2)

## 2022-04-18 LAB — RENAL FUNCTION PANEL
Albumin: 2.5 g/dL — ABNORMAL LOW (ref 3.5–5.0)
Anion gap: 7 (ref 5–15)
BUN: 16 mg/dL (ref 8–23)
CO2: 24 mmol/L (ref 22–32)
Calcium: 8.2 mg/dL — ABNORMAL LOW (ref 8.9–10.3)
Chloride: 105 mmol/L (ref 98–111)
Creatinine, Ser: 0.73 mg/dL (ref 0.61–1.24)
GFR, Estimated: >60 mL/min (ref >60)
Glucose, Bld: 116 mg/dL — ABNORMAL HIGH (ref 70–99)
Phosphorus: 2.7 mg/dL (ref 2.5–4.6)
Potassium: 3.8 mmol/L (ref 3.5–5.1)
Sodium: 136 mmol/L (ref 135–145)

## 2022-04-18 LAB — MAGNESIUM: Magnesium: 2.3 mg/dL (ref 1.7–2.4)

## 2022-04-18 NOTE — Discharge Summary (Signed)
? ?Physician Discharge Summary  ?Kevin [name] IRS:[id number] DOB: [birth year] DOA: 04/15/2022 ? ?PCP: Ladell Pier, MD ? ?Admit date: 04/15/2022 ?Discharge date: 04/18/2022 ?Admitted From: Home ?Disposition: Home ?Recommendations for Outpatient Follow-up:  ?Follow up with PCP as below. ?Outpatient follow-up with oncology and general surgery as previously planned ?Please obtain CBC/BMP/Mag at follow up ?Please follow up on the following pending results: None ? ?Home Health: Not indicated ?Equipment/Devices: Not indicated ? ?Discharge Condition: Stable ?CODE STATUS: Full code ? Follow-up Information   ? ? Ladell Pier, MD. Schedule an appointment as soon as possible for a visit in 1 week(s).   ?Specialty: Internal Medicine ?Contact information: ?[address] ?[address] ?[place] [address] ?[phone] ? ? ?  ?  ? ?  ?  ? ?  ? ? ?Hospital course ?71 year old Spanish-speaking male with PMH of GIST of stomach, tobacco use disorder, HTN and anemia presenting with weakness, fever, chills, abdominal pain and poor p.o. intake and admitted for possible sepsis of undetermined organism or source.  He had a temp of 105.1 on the day of admission but had no leukocytosis or clear source of infection.  He was started on broad-spectrum antibiotics with Vanco, cefepime and Flagyl.  However, source of infection remains unclear.  Urine culture with insignificant growth.  Blood culture with Staph epidermidis in 1 out of 2 bottles likely contaminant.  Patient's symptoms improved.  He remained afebrile.  Antibiotics discontinued.  Oncology was consulted and recommended surgical consult.  However, surgery recommended outpatient follow-up.  Oncology resumed his [place].  ? ?Patient remained stable off antibiotics.  Symptoms resolved.  Discharged home to follow-up with his oncologist and general surgery.  ? ?See individual problem list below for more on hospital course. ? 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  ?Assessment and Plan: ?* SIRS (systemic inflammatory response syndrome) (HCC) ?No clear source of infection.  Urine culture with insignificant growth.  Blood culture with Streptococcus epidermidis in 1 out of 2 bottles likely contaminant. Sepsis ruled out.  Antibiotics discontinued and he remained stable off antibiotics.  SIRS could be due to his malignancy and chemo. ? ?Malignant gastrointestinal stromal tumor (GIST) of stomach ([place]) ?Oncology resumed [place] ?General surgery recommended outpatient follow-up ? ?Positive blood culture ?Staph epidermidis in 1 out of 2 bottles likely contaminant.  Antibiotics discontinued. ? ?Lymphocytopenia ?Due to [place]?  Seems to have resolved. ? ?Elevated troponin ?Troponin 20>> 79.  Patient without chest pain.  EKG without acute ischemic finding.  Doubt need for further evaluation ? ?Hypocalcemia ?Corrects to normal for hypoalbuminemia ? ?Essential hypertension ?Resume home amlodipine and lisinopril. ? ?Normocytic anemia ?Recent Labs  ?  04/23/21 ?1115 06/06/21 ?1057 12/19/21 ?0904 01/25/22 ?0808 02/15/22 ?1008 03/15/22 ?1109 04/15/22 ?6789 04/16/22 ?0441 04/17/22 ?0446 04/18/22 ?0512  ?HGB 12.9* 14.6 11.1* 11.0* 11.9* 11.1* 9.9* 8.4* 9.0* 9.7*  ?H&H stable after initial drop.  Continue monitoring ? ? ?Lactic acidosis-resolved as of 04/18/2022 ?Likely dehydration versus sepsis.  Resolved. ? ?Hypokalemia-resolved as of 04/18/2022 ?Resolved. ? ? ? ? ?  ?  ?  ?  ? ?  ? ?Vital signs ?Vitals:  ? 04/17/22 0609 04/17/22 1305 04/17/22 2049 04/18/22 0419  ?BP: 138/85 (!) 175/88 140/78 127/70  ?Pulse: 82 77 74 76  ?  Temp: 98.4 ?F (36.9 ?C) 98 ?F (36.7 ?C) 99.6 ?F (37.6 ?C) 98 ?F (36.7 ?C)   ?Resp: '18 18 18 18  '$ ?SpO2: 98% 99% 98% 99%  ?TempSrc: Oral Oral Oral Oral  ?  ? ?Discharge exam ? ?GENERAL: No apparent distress.  Nontoxic. ?HEENT: MMM.  Vision and hearing grossly intact.  ?NECK: Supple.  No apparent JVD.  ?RESP:  No IWOB.  Fair aeration bilaterally. ?CVS:  RRR. Heart sounds normal.  ?ABD/GI/GU: BS+. Abd soft, NTND.  ?MSK/EXT:  Moves extremities. No apparent deformity. No edema.  ?SKIN: no apparent skin lesion or wound ?NEURO: Awake and alert. Oriented appropriately.  No apparent focal neuro deficit. ?PSYCH: Calm. Normal affect.  ? ?Discharge Instructions ?Discharge Instructions   ? ? Call MD for:  extreme fatigue   Complete by: As directed ?  ? Call MD for:  persistant dizziness or light-headedness   Complete by: As directed ?  ? Call MD for:  persistant nausea and vomiting   Complete by: As directed ?  ? Call MD for:  severe uncontrolled pain   Complete by: As directed ?  ? Call MD for:  temperature >100.4   Complete by: As directed ?  ? Diet - low sodium heart healthy   Complete by: As directed ?  ? Discharge instructions   Complete by: As directed ?  ? It has been a pleasure taking care of you! ? ?You were hospitalized due to weakness, fever, chills, abdominal pain and decreased oral intakes.  Initially, concern about infection which was excluded based on the test tests we have run.  Your symptoms resolved.  We are discharging you to follow-up with your oncologist and general surgeon.  ? ? ?Take care,  ? Increase activity slowly   Complete by: As directed ?  ? ?  ? ?Allergies as of 04/18/2022   ?No Known Allergies ?  ? ?  ?Medication List  ?  ? ?TAKE these medications   ? ?acetaminophen 500 MG tablet ?Commonly known as: TYLENOL ?Take 1,000 mg by mouth every 6 (six) hours as needed for mild pain. ?  ?amLODipine 10 MG tablet ?Commonly known as: NORVASC ?Tome 1 tableta por v?a oral diariamente. ?(TAKE 1 TABLET BY MOUTH DAILY.) ?What changed: how much to take ?  ?imatinib 100 MG tablet ?Commonly  known as: GLEEVEC ?TAKE 4 TABLETS (400 MG) BY MOUTH ONCE DAILY. TAKE WITH MEALS AND A FULL GLASS OF WATER. CAUTION: CHEMOTHERAPY ?What changed:  ?how much to take ?how to take this ?when to take this ?  ?lisinopril 10 MG tablet ?Commonly known as: ZESTRIL ?Tome 1 tableta (10 mg en total) por v?a oral diariamente. ?(Take 1 tablet (10 mg total) by mouth daily.) ?  ? ?  ? ? ?Consultations: ?Oncology ?General surgery ? ?Procedures/Studies: ? ? ?CT ABDOMEN PELVIS W CONTRAST ? ?Result Date: 04/15/2022 ?CLINICAL DATA:  History of liver abscess. Weakness and fevers. Known GI stromal tumor on oral chemotherapy. EXAM: CT ABDOMEN AND PELVIS WITH CONTRAST TECHNIQUE: Multidetector CT imaging of the abdomen and pelvis was performed using the standard protocol following bolus administration of intravenous contrast. RADIATION DOSE REDUCTION: This exam was performed according to the departmental dose-optimization program which includes automated exposure control, adjustment of the mA and/or kV according to patient size and/or use of iterative reconstruction technique. CONTRAST:  144m OMNIPAQUE IOHEXOL 300 MG/ML  SOLN COMPARISON:  03/15/2022 FINDINGS: Lower chest: Mild dependent changes noted within the posterior lung bases. No pleural effusion or airspace consolidation.  Hepatobiliary: No focal liver abnormality is seen. No gallstones, gallbladder wall thickening, or biliary dilatation. Pancreas: No signs of pancreatic inflammation, mass or main duct dilatation. Mass arising off the posterior stomach has mass effect upon the tail of pancreas as noted previously. Spleen: Normal in size without focal abnormality. Adrenals/Urinary Tract: Normal appearance of the adrenal glands. Mild bilateral perinephric haziness is identified which appears new from previous exam and may be seen in the setting of pyelonephritis. No hydronephrosis identified. 3 cm Bosniak class 1 cyst arises off the lateral cortex of the upper pole of left kidney.  Inferior pole right kidney scarring is unchanged. Bladder is unremarkable. Stomach/Bowel: Large exophytic mass arising off the posterior wall of the stomach is again noted. This measures 11.4 x 9.6 by 10.9 cm (volum

## 2022-04-18 NOTE — Plan of Care (Signed)
?  Problem: Education: ?Goal: Knowledge of General Education information will improve ?Description: Including pain rating scale, medication(s)/side effects and non-pharmacologic comfort measures ?Outcome: Progressing ?  ?Problem: Clinical Measurements: ?Goal: Will remain free from infection ?Outcome: Progressing ?Goal: Diagnostic test results will improve ?Outcome: Progressing ?  ?Problem: Activity: ?Goal: Risk for activity intolerance will decrease ?Outcome: Progressing ?  ?Problem: Nutrition: ?Goal: Adequate nutrition will be maintained ?Outcome: Progressing ?  ?Problem: Coping: ?Goal: Level of anxiety will decrease ?Outcome: Progressing ?  ?Problem: Elimination: ?Goal: Will not experience complications related to bowel motility ?Outcome: Progressing ?Goal: Will not experience complications related to urinary retention ?Outcome: Progressing ?  ?Problem: Pain Managment: ?Goal: General experience of comfort will improve ?Outcome: Progressing ?  ?

## 2022-04-18 NOTE — Progress Notes (Addendum)
HEMATOLOGY-ONCOLOGY PROGRESS NOTE ? ?ASSESSMENT AND PLAN: ?GIST of the stomach ?CT abdomen/pelvis 01/21/2020-large necrotic mass centered in the posterior wall of the stomach, irregular hypodense mass right lobe liver  ?Upper endoscopy 01/24/2020-large infiltrative mass with no bleeding found in the posterior wall of the stomach.  Biopsy-gastrointestinal stromal tumor  ?MRI liver 01/28/2020-large exophytic centrally necrotic 17.3 x 12.3 cm gastric mass arising from posterior proximal stomach centered in the left upper quadrant with thick enhancing wall.  Complex multilocular 10.7 x 8.2 cm cystic liver mass right liver lobe.   ?Neoadjuvant Gleevec 01/28/2020 ?CT 03/03/2020, resolution of fluid component of right hepatic abscess, unchanged mass posterior to the gastric body, unchanged left periaortic and aortocaval adenopathy ?CT 06/01/2020-decreased size of a multilobulated mass arising from the posterior stomach, unchanged retroperitoneal lymph nodes, removal of liver drain resolution of abscess ?CT 09/14/20-no significant change in the large left upper quadrant mass arising from the proximal stomach.  Near complete resolution of right hepatic lobe abscess. ?CT 06/06/2021-no change in mass arising from the posterior gastric fundus, no lymphadenopathy or evidence of metastatic disease ?Patient discontinued Gleevec around September 2022 per his report ?CT 12/19/2021-significant interval increase in size of the large gastric mass which is partially necrotic, measures 16.5 x 14 cm compared to previous measurement of 9 x 7.6 cm.  Lumen of the stomach is significantly compressed.  Also mass-effect on the pancreas, spleen, left kidney and left adrenal gland.  No definite findings for direct invasion of these organs.  No findings of hepatic metastatic disease.  Stable small scattered mesenteric and retroperitoneal lymph nodes. ?12/25/2021 Gleevec 400 mg daily ?CT abdomen/pelvis 03/15/2022-interval 67% reduction in volume of the large  left upper quadrant GI stromal tumor, scattered small retroperitoneal lymphadenopathy similar to prior ?CT abdomen/pelvis 04/15/2022-large exophytic mass arising off the posterior wall of the stomach noted and compared to prior exam there has been a mild decrease in tumor volume, signs of internal necrosis, new gas identified within the dominant chronic component. ?2. Liver lesion in the right lobe ?            -Liver mass consistent with abscess on MRI of the liver from 01/27/2020 ?            -Status post CT-guided drainage of the liver abscess on 01/28/2020-culture positive for Streptococcus intermedius ?            -IV antibiotics and discharged home on 10-day course of Augmentin ?            -CT 03/03/2020-clearance of fluid component of liver abscess, unchanged mass posterior to the gastric body, stable left periaortic and aortocaval adenopathy ?            -Abscess drain removed 03/09/2020 ?            -Liver abscess resolved on CT 06/01/2020 and 09/14/2020 ?3. Iron deficiency anemia-oral iron,  resolved ?Anemia on hospital admission 04/15/2022-likely secondary to sepsis and phlebotomy ?4. Gastritis and H. pylori infection ?5.  History of fever secondary to the liver abscess ?6. Protein calorie malnutrition-resolved ?7.  Hospital admission 04/15/2022-sepsis, blood culture positive for Staph epidermidis ? ?Kevin [name] appears stable.  He remains afebrile.  The Staph epidermidis may be a skin contaminant.  There is no apparent source for infection other than the chronic gastric mass. ? ?Treatment options have been discussed for his GIST.  The only potentially curative therapy is surgical resection of the gastric mass.  He is willing to discuss the surgical options.  He will have outpatient follow-up with Dr. Zenia Resides. ? ?He will continue on Gleevec. ? ?Recommendations: ?1.  Continue antibiotics per primary team. ?2.  Outpatient follow-up with Dr. Zenia Resides. ?3.  Continue Gleevec ? ?Outpatient follow-up will be scheduled at the  cancer center upon discharge. ? ?Mikey Bussing, DNP, AGPCNP-BC, AOCNP ?Kevin [name] was interviewed and examined.  He appears stable.  He will be discharged to home.  He will continue Gleevec.  We will arrange for outpatient follow-up at the cancer center within the next few weeks.  He will be scheduled for follow-up with Dr. Zenia Resides. ? ?I was present for greater than 50% of today's visit.  I performed medical decision making. ? ? ?SUBJECTIVE: Kevin [name] has no new complaint.  Has pain only when palpating his left abdominal mass. ? ?PHYSICAL EXAMINATION: ? ?Vitals:  ? 04/17/22 2049 04/18/22 0419  ?BP: 140/78 127/70  ?Pulse: 74 76  ?Resp: 18 18  ?Temp: 99.6 ?F (37.6 ?C) 98 ?F (36.7 ?C)  ?SpO2: 98% 99%  ? ?There were no vitals filed for this visit. ? ?Intake/Output from previous day: ?05/10 0701 - 05/11 0700 ?In: 360 [P.O.:360] ?Out: 800 [Urine:800] ? ?Physical examination: ?Abdomen-soft, mild tenderness in the left upper abdomen, palpable mass on deep palpation at the left subcostal region.  No hepatomegaly. ?Vascular: No leg edema ?HEENT: No thrush ? ?LABORATORY DATA:  ?I have reviewed the data as listed ? ?  Latest Ref Rng & Units 04/18/2022  ?  5:12 AM 04/17/2022  ?  4:46 AM 04/16/2022  ?  4:41 AM  ?CMP  ?Glucose 70 - 99 mg/dL 116   111   116    ?BUN 8 - 23 mg/dL '16   13   15    '$ ?Creatinine 0.61 - 1.24 mg/dL 0.73   0.83   0.90    ?Sodium 135 - 145 mmol/L 136   136   136    ?Potassium 3.5 - 5.1 mmol/L 3.8   3.9   3.4    ?Chloride 98 - 111 mmol/L 105   108   108    ?CO2 22 - 32 mmol/L '24   23   22    '$ ?Calcium 8.9 - 10.3 mg/dL 8.2   7.7   7.2    ?Total Protein 6.5 - 8.1 g/dL  5.9   5.7    ?Total Bilirubin 0.3 - 1.2 mg/dL  0.5   0.8    ?Alkaline Phos 38 - 126 U/L  86   47    ?AST 15 - 41 U/L  26   17    ?ALT 0 - 44 U/L  16   12    ? ? ?Lab Results  ?Component Value Date  ? WBC 4.5 04/18/2022  ? HGB 9.7 (L) 04/18/2022  ? HCT 30.5 (L) 04/18/2022  ? MCV 85.9 04/18/2022  ? PLT 230 04/18/2022  ? NEUTROABS 2.7 04/17/2022   ? ? ?No results found for: CEA1, CEA, [id number], CA125, PSA1 ? ?CT ABDOMEN PELVIS W CONTRAST ? ?Result Date: 04/15/2022 ?CLINICAL DATA:  History of liver abscess. Weakness and fevers. Known GI stromal tumor on oral chemotherapy. EXAM: CT ABDOMEN AND PELVIS WITH CONTRAST TECHNIQUE: Multidetector CT imaging of the abdomen and pelvis was performed using the standard protocol following bolus administration of intravenous contrast. RADIATION DOSE REDUCTION: This exam was performed according to the departmental dose-optimization program which includes automated exposure control, adjustment of the mA and/or kV according to patient size and/or use of iterative reconstruction  technique. CONTRAST:  138m OMNIPAQUE IOHEXOL 300 MG/ML  SOLN COMPARISON:  03/15/2022 FINDINGS: Lower chest: Mild dependent changes noted within the posterior lung bases. No pleural effusion or airspace consolidation. Hepatobiliary: No focal liver abnormality is seen. No gallstones, gallbladder wall thickening, or biliary dilatation. Pancreas: No signs of pancreatic inflammation, mass or main duct dilatation. Mass arising off the posterior stomach has mass effect upon the tail of pancreas as noted previously. Spleen: Normal in size without focal abnormality. Adrenals/Urinary Tract: Normal appearance of the adrenal glands. Mild bilateral perinephric haziness is identified which appears new from previous exam and may be seen in the setting of pyelonephritis. No hydronephrosis identified. 3 cm Bosniak class 1 cyst arises off the lateral cortex of the upper pole of left kidney. Inferior pole right kidney scarring is unchanged. Bladder is unremarkable. Stomach/Bowel: Large exophytic mass arising off the posterior wall of the stomach is again noted. This measures 11.4 x 9.6 by 10.9 cm (volume = 620 cm^3), image 18/2 and image 63/5. Previously this measured 12.4 by 9.0 by 12.0 cm (volume = 700 cm^3). There are several necrotic components within this mass as noted  previously. The largest necrotic component measures 8.1 x 4.9 cm, image 21/2. Previously 7.8 by 5.7 cm. Gas is now seen within this dominant component scratch set multiple foci of gas are now seen within this ne

## 2022-04-19 ENCOUNTER — Inpatient Hospital Stay: Payer: Medicare Other

## 2022-04-19 ENCOUNTER — Telehealth: Payer: Self-pay

## 2022-04-19 ENCOUNTER — Inpatient Hospital Stay: Payer: Medicare Other | Admitting: Nurse Practitioner

## 2022-04-19 NOTE — Telephone Encounter (Signed)
Transition Care Management Unsuccessful Follow-up Telephone Call ? ?Date of discharge and from where:  [facility] on 04/18/2022 ? ?Attempts:  1st Attempt ? ?Reason for unsuccessful TCM follow-up call:  Missing or invalid number unable to reach pt [phone] [facility])  [phone] second number attempted ringing with no VM option.   ?Need HFU appt with PCP. ? ?  ?

## 2022-04-20 LAB — CULTURE, BLOOD (ROUTINE X 2)
Culture: NO GROWTH
Special Requests: ADEQUATE

## 2022-04-22 ENCOUNTER — Telehealth: Payer: Self-pay | Admitting: *Deleted

## 2022-04-22 ENCOUNTER — Telehealth: Payer: Self-pay

## 2022-04-22 NOTE — Telephone Encounter (Addendum)
Transition Care Management Follow-up Telephone Call ? ?Call completed with assistance of Spanish Interpreter # [id number]/[employer] ?Date of discharge and from where: 04/18/2022, [facility] ?How have you been since you were released from the hospital? He said he is good, feeling fine.  ?Any questions or concerns? Yes-  he stated that he does not want to come back to [facility]. He was not pleased with how he was treated. He said that he was just in and out of the clinic, someone looked at this hands and he got a bill for $600.  He said he explained this to his other doctor, he could not remember that doctor's name but said that he has been seeing him for more than 2 years. The patient stated that doctor told him that he would manages his care for him .  ? ?Items Reviewed: ?Did the pt receive and understand the discharge instructions provided? Yes  ?Medications obtained and verified? Yes - he said he has all of his medications and he is fine.  ?Other? No  ?Any new allergies since your discharge? No  ?Dietary orders reviewed? No ?Do you have support at home? Yes , his son.  ? ?Home Care and Equipment/Supplies: ?Were home health services ordered? no ?If so, what is the name of the agency? N/a  ?Has the agency set up a time to come to the patient's home? not applicable ?Were any new equipment or medical supplies ordered?  No ?What is the name of the medical supply agency? N/a ?Were you able to get the supplies/equipment? not applicable ?Do you have any questions related to the use of the equipment or supplies? No ? ?Functional Questionnaire: (I = Independent and D = Dependent) ?ADLs: independent ? ? ?Follow up appointments reviewed: ? ?PCP Hospital f/u appt confirmed?  He did not want to schedule an appointment at [facility] or at any of the [facility]   ?[facility] f/u appt confirmed? Yes  Scheduled to see Oncology - 05/09/2022.   ?Are transportation arrangements needed? No  ?If their condition  worsens, is the pt aware to call PCP or go to the Emergency Dept.? Yes ?Was the patient provided with contact information for the PCP's office or ED? The phone number for [facility] is on his AVS but he said he will follow up with oncology.  ?Was to pt encouraged to call back with questions or concerns? Yes ? ?

## 2022-04-22 NOTE — Telephone Encounter (Signed)
Mrs. Swiatek was contacted by telephone to verify understanding of discharge instructions status post the most recent discharge from the hospital on the date:  04/18/22.  Inpatient discharge AVS was re-reviewed with wife, along with cancer center appointments.  Verification of understanding for oncology specific follow-up was validated using the Teach Back method.   ?Confirmed he is taking Gleevec 400 mg daily and provided appointment with Dr. Michaelle Birks for 04/30/22 with 1123 arrival time. She agrees to get him there and confirmed that he does agree to see the surgeon to discuss surgery. ? ?Transportation to appointments were confirmed for the patient as being self/caregiver. ? ?Mrs. Goyer?s questions were addressed to their satisfaction upon completion of this post discharge follow-up call for outpatient oncology.  ?

## 2022-04-29 ENCOUNTER — Other Ambulatory Visit: Payer: Self-pay | Admitting: Oncology

## 2022-04-29 ENCOUNTER — Other Ambulatory Visit (HOSPITAL_COMMUNITY): Payer: Self-pay

## 2022-04-30 ENCOUNTER — Other Ambulatory Visit (HOSPITAL_COMMUNITY): Payer: Self-pay

## 2022-04-30 MED ORDER — IMATINIB MESYLATE 100 MG PO TABS
ORAL_TABLET | ORAL | 0 refills | Status: DC
Start: 2022-04-30 — End: 2022-05-16
  Filled 2022-04-30: qty 120, 30d supply, fill #0

## 2022-05-01 ENCOUNTER — Ambulatory Visit: Payer: Self-pay | Admitting: Surgery

## 2022-05-01 DIAGNOSIS — C49A2 Gastrointestinal stromal tumor of stomach: Secondary | ICD-10-CM

## 2022-05-01 NOTE — H&P (View-Only) (Signed)
History of Present Illness: Kevin [name] is a 71 y.o. male who is seen today for follow up of a large GIST. He was initially diagnosed in February 2021, at which time he also had a large liver abscess (which has since resolved). He was started on neoadjuvant Gleevec and saw Dr. Barry Dienes in November 2021. Surgery was recommended and planned, but he never scheduled the surgery and decided not to proceed with surgery at that time. He continued to follow with Dr. Benay Spice and was treated with [facility], and later agreed to have another surgical evaluation. He saw me last July and surgery was scheduled for 08/09/21, however the patient no-showed both his preop visit and the surgery itself and could not be reached on multiple attempts to contact him. He was subsequently lost to follow up and did not return to see Dr. Benay Spice until January 2023. At that time he reported he had stopped taking Gleevec in the fall, and imaging showed a significant increase in size of the GIST. His Gleevec was resumed and he has had regular follow up with Dr. Benay Spice since then. Interval CT in April showed a decrease in size of the mass since resuming Gleevec.   He was recently admitted to the hospital with high grade fevers. A CT scan showed new gas within the gastric mass. No other source of infection was identified and antibiotics were ultimately discontinued. He agreed to another surgical evaluation while inpatient and is here today to discuss surgical options. Today he says he has had a poor appetite since his hospitalization and sometimes has chills. He is having regular bowel movements. He says he decided not to have surgery last fall because he was worried about something bad happening and him not being able to return to his family. He is open to surgery today.     Review of Systems: A complete review of systems was obtained from the patient.  I have reviewed this information and discussed as appropriate with the patient.  See HPI  as well for other ROS.       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 No pertinent surgical history.    No Known Allergies   Current Outpatient Medications on File Prior to Visit Medication Sig Dispense Refill  amLODIPine (NORVASC) 10 MG tablet Take 1 tablet by mouth once daily      ferrous sulfate 325 (65 FE) MG tablet Take 325 mg by mouth 2 (two) times daily with meals      mirtazapine (REMERON) 15 MG tablet TAKE 1 TABLET (15 MG TOTAL) BY MOUTH AT BEDTIME.      Saccharomyces boulardii (FLORASTOR) 250 mg capsule Take 250 mg by mouth 2 (two) times daily      sennosides-docusate (SENOKOT-S) 8.6-50 mg tablet Take 1 tablet by mouth once daily       No current facility-administered medications on file prior to visit.     History reviewed. No pertinent family history.    Social History   Tobacco Use Smoking Status Never Smokeless Tobacco Never     Social History   Socioeconomic History  Marital status: Married Tobacco Use  Smoking status: Never  Smokeless tobacco: Never Vaping Use  Vaping Use: Never used Substance and Sexual Activity  Alcohol use: Never  Drug use: Never     Objective:  Vitals:   04/30/22 1124 BP: (!) 160/84 Pulse: 93 Temp: 37 C (98.6 F) SpO2: 98% Weight: 69.2 kg (152 lb 9.6 oz) Height: 157.5 cm ('5\' 2"'$ )   Body mass index is 27.91 kg/m.   Physical Exam Vitals reviewed.  Constitutional:      General: He is not in acute distress.    Appearance: Normal appearance.  HENT:     Head: Normocephalic and atraumatic.  Eyes:     General: No scleral icterus. Cardiovascular:     Rate and Rhythm: Normal rate and regular rhythm.     Heart sounds: No murmur heard. Pulmonary:     Effort: Pulmonary effort is normal. No respiratory distress.     Breath sounds: Normal breath sounds.  No wheezing.  Abdominal:     General: There is no distension.     Palpations: Abdomen is soft.     Tenderness: There is no abdominal tenderness.  Musculoskeletal:        General: Normal range of motion.  Skin:    General: Skin is warm and dry.     Coloration: Skin is not jaundiced.  Neurological:     General: No focal deficit present.     Mental Status: He is alert and oriented to person, place, and time.  Psychiatric:        Mood and Affect: Mood normal.        Behavior: Behavior normal.        Thought Content: Thought content normal.            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  I reviewed his previous imaging.  His tumor has significantly decreased in size since he resumed Gleevec in January.  He does not have gas within the tumor, likely secondary to tumor necrosis and possible fistulization with the stomach.  I discussed with him today that surgical resection is the only possible curative treatment.  He has previously refused surgery but says he is now agreeable to having definitive treatment.  I reviewed the details of surgery.  Based on the size of the tumor I would plan for an open approach.  The tumor appears to just be causing mass effect on the tail the pancreas and spleen, but I reviewed the possibility of a distal pancreatectomy and/or splenectomy.  In the patient's poor appetite, I may also place a feeding tube at the time of surgery depending on how much of the stomach needs to be resected.  I reviewed all of this in detail with the patient with the assistance of a Spanish interpreter.  I also reviewed the expected recovery time.  The patient expressed understanding and agrees to proceed.  He will be contacted to schedule a surgery date.  Michaelle Birks, MD [facility] Surgery General, Hepatobiliary and  Pancreatic Surgery 05/01/22 2:46 PM

## 2022-05-01 NOTE — H&P (Signed)
History of Present Illness: Kevin [name] is a 71 y.o. male who is seen today for follow up of a large GIST. He was initially diagnosed in February 2021, at which time he also had a large liver abscess (which has since resolved). He was started on neoadjuvant Gleevec and saw Dr. Barry Dienes in November 2021. Surgery was recommended and planned, but he never scheduled the surgery and decided not to proceed with surgery at that time. He continued to follow with Dr. Benay Spice and was treated with [place], and later agreed to have another surgical evaluation. He saw me last July and surgery was scheduled for 08/09/21, however the patient no-showed both his preop visit and the surgery itself and could not be reached on multiple attempts to contact him. He was subsequently lost to follow up and did not return to see Dr. Benay Spice until January 2023. At that time he reported he had stopped taking Gleevec in the fall, and imaging showed a significant increase in size of the GIST. His Gleevec was resumed and he has had regular follow up with Dr. Benay Spice since then. Interval CT in April showed a decrease in size of the mass since resuming Gleevec.   He was recently admitted to the hospital with high grade fevers. A CT scan showed new gas within the gastric mass. No other source of infection was identified and antibiotics were ultimately discontinued. He agreed to another surgical evaluation while inpatient and is here today to discuss surgical options. Today he says he has had a poor appetite since his hospitalization and sometimes has chills. He is having regular bowel movements. He says he decided not to have surgery last fall because he was worried about something bad happening and him not being able to return to his family. He is open to surgery today.     Review of Systems: A complete review of systems was obtained from the patient.  I have reviewed this information and discussed as appropriate with the patient.  See HPI  as well for other ROS.       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 No pertinent surgical history.    No Known Allergies   Current Outpatient Medications on File Prior to Visit Medication Sig Dispense Refill  amLODIPine (NORVASC) 10 MG tablet Take 1 tablet by mouth once daily      ferrous sulfate 325 (65 FE) MG tablet Take 325 mg by mouth 2 (two) times daily with meals      mirtazapine (REMERON) 15 MG tablet TAKE 1 TABLET (15 MG TOTAL) BY MOUTH AT BEDTIME.      Saccharomyces boulardii (FLORASTOR) 250 mg capsule Take 250 mg by mouth 2 (two) times daily      sennosides-docusate (SENOKOT-S) 8.6-50 mg tablet Take 1 tablet by mouth once daily       No current facility-administered medications on file prior to visit.     History reviewed. No pertinent family history.    Social History   Tobacco Use Smoking Status Never Smokeless Tobacco Never     Social History   Socioeconomic History  Marital status: Married Tobacco Use  Smoking status: Never  Smokeless tobacco: Never Vaping Use  Vaping Use: Never used Substance and Sexual Activity  Alcohol use: Never  Drug use: Never     Objective:  Vitals:   04/30/22 1124 BP: (!) 160/84 Pulse: 93 Temp: 37 C (98.6 F) SpO2: 98% Weight: 69.2 kg (152 lb 9.6 oz) Height: 157.5 cm ('5\' 2"'$ )   Body mass index is 27.91 kg/m.   Physical Exam Vitals reviewed.  Constitutional:      General: He is not in acute distress.    Appearance: Normal appearance.  HENT:     Head: Normocephalic and atraumatic.  Eyes:     General: No scleral icterus. Cardiovascular:     Rate and Rhythm: Normal rate and regular rhythm.     Heart sounds: No murmur heard. Pulmonary:     Effort: Pulmonary effort is normal. No respiratory distress.     Breath sounds: Normal breath sounds.  No wheezing.  Abdominal:     General: There is no distension.     Palpations: Abdomen is soft.     Tenderness: There is no abdominal tenderness.  Musculoskeletal:        General: Normal range of motion.  Skin:    General: Skin is warm and dry.     Coloration: Skin is not jaundiced.  Neurological:     General: No focal deficit present.     Mental Status: He is alert and oriented to person, place, and time.  Psychiatric:        Mood and Affect: Mood normal.        Behavior: Behavior normal.        Thought Content: Thought content normal.            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  I reviewed his previous imaging.  His tumor has significantly decreased in size since he resumed Gleevec in January.  He does not have gas within the tumor, likely secondary to tumor necrosis and possible fistulization with the stomach.  I discussed with him today that surgical resection is the only possible curative treatment.  He has previously refused surgery but says he is now agreeable to having definitive treatment.  I reviewed the details of surgery.  Based on the size of the tumor I would plan for an open approach.  The tumor appears to just be causing mass effect on the tail the pancreas and spleen, but I reviewed the possibility of a distal pancreatectomy and/or splenectomy.  In the patient's poor appetite, I may also place a feeding tube at the time of surgery depending on how much of the stomach needs to be resected.  I reviewed all of this in detail with the patient with the assistance of a Spanish interpreter.  I also reviewed the expected recovery time.  The patient expressed understanding and agrees to proceed.  He will be contacted to schedule a surgery date.  Michaelle Birks, MD [facility] Surgery General, Hepatobiliary and  Pancreatic Surgery 05/01/22 2:46 PM

## 2022-05-09 ENCOUNTER — Encounter: Payer: Self-pay | Admitting: Nurse Practitioner

## 2022-05-09 ENCOUNTER — Inpatient Hospital Stay (HOSPITAL_BASED_OUTPATIENT_CLINIC_OR_DEPARTMENT_OTHER): Payer: Medicare Other | Admitting: Nurse Practitioner

## 2022-05-09 ENCOUNTER — Inpatient Hospital Stay: Payer: Medicare Other | Attending: Oncology

## 2022-05-09 VITALS — BP 156/80 | HR 75 | Temp 98.1°F | Resp 18 | Ht 62.0 in | Wt 150.2 lb

## 2022-05-09 DIAGNOSIS — R110 Nausea: Secondary | ICD-10-CM | POA: Diagnosis not present

## 2022-05-09 DIAGNOSIS — C49A2 Gastrointestinal stromal tumor of stomach: Secondary | ICD-10-CM | POA: Insufficient documentation

## 2022-05-09 LAB — CBC WITH DIFFERENTIAL (CANCER CENTER ONLY)
Abs Immature Granulocytes: 0.02 10*3/uL (ref 0.00–0.07)
Basophils Absolute: 0 10*3/uL (ref 0.0–0.1)
Basophils Relative: 0 %
Eosinophils Absolute: 0.1 10*3/uL (ref 0.0–0.5)
Eosinophils Relative: 2 %
HCT: 30.1 % — ABNORMAL LOW (ref 39.0–52.0)
Hemoglobin: 9.2 g/dL — ABNORMAL LOW (ref 13.0–17.0)
Immature Granulocytes: 0 %
Lymphocytes Relative: 20 %
Lymphs Abs: 1 10*3/uL (ref 0.7–4.0)
MCH: 26.2 pg (ref 26.0–34.0)
MCHC: 30.6 g/dL (ref 30.0–36.0)
MCV: 85.8 fL (ref 80.0–100.0)
Monocytes Absolute: 0.4 10*3/uL (ref 0.1–1.0)
Monocytes Relative: 8 %
Neutro Abs: 3.5 10*3/uL (ref 1.7–7.7)
Neutrophils Relative %: 70 %
Platelet Count: 303 10*3/uL (ref 150–400)
RBC: 3.51 MIL/uL — ABNORMAL LOW (ref 4.22–5.81)
RDW: 15.8 % — ABNORMAL HIGH (ref 11.5–15.5)
WBC Count: 5.1 10*3/uL (ref 4.0–10.5)
nRBC: 0 % (ref 0.0–0.2)

## 2022-05-09 LAB — CMP (CANCER CENTER ONLY)
ALT: 9 U/L (ref 0–44)
AST: 12 U/L — ABNORMAL LOW (ref 15–41)
Albumin: 3.2 g/dL — ABNORMAL LOW (ref 3.5–5.0)
Alkaline Phosphatase: 61 U/L (ref 38–126)
Anion gap: 9 (ref 5–15)
BUN: 20 mg/dL (ref 8–23)
CO2: 25 mmol/L (ref 22–32)
Calcium: 8.2 mg/dL — ABNORMAL LOW (ref 8.9–10.3)
Chloride: 104 mmol/L (ref 98–111)
Creatinine: 0.94 mg/dL (ref 0.61–1.24)
GFR, Estimated: >60 mL/min (ref >60)
Glucose, Bld: 115 mg/dL — ABNORMAL HIGH (ref 70–99)
Potassium: 3.2 mmol/L — ABNORMAL LOW (ref 3.5–5.1)
Sodium: 138 mmol/L (ref 135–145)
Total Bilirubin: 0.5 mg/dL (ref 0.3–1.2)
Total Protein: 7.2 g/dL (ref 6.5–8.1)

## 2022-05-09 NOTE — Progress Notes (Signed)
[facility] OFFICE PROGRESS NOTE   Diagnosis: Gastrointestinal stromal tumor  INTERVAL HISTORY:   Kevin [name] returns for follow-up.  He is accompanied by his daughter and an interpreter.  No thrush or ulcers.  He continues [place].  He saw Dr. Zenia [name] as an outpatient 04/30/2022.  He agrees to surgical resection.  Surgery is scheduled to occur 05/29/2022.  He has intermittent nausea.  His daughter estimates he vomits 5 times a month.  No significant diarrhea.  No rash.  No leg swelling.  No pain.  Objective:  Vital signs in last 24 hours:  Blood pressure (!) 156/80, pulse 75, temperature 98.1 F (36.7 C), temperature source Oral, resp. rate 18, height '5\' 2"'$  (1.575 m), weight 150 lb 3.2 oz (68.1 kg), SpO2 98 %.    HEENT: No thrush or ulcers. Resp: Lungs clear bilaterally. Cardio: Regular rate and rhythm. GI: Abdomen is soft.  Mild tenderness left upper abdomen with firm fullness.  No hepatomegaly. Vascular: No leg edema. Skin: No rash.   Lab Results:  Lab Results  Component Value Date   WBC 5.1 05/09/2022   HGB 9.2 (L) 05/09/2022   HCT 30.1 (L) 05/09/2022   MCV 85.8 05/09/2022   PLT 303 05/09/2022   NEUTROABS 3.5 05/09/2022    Imaging:  No results found.  Medications: I have reviewed the patient's current medications.  Assessment/Plan: GIST of the stomach CT abdomen/pelvis 01/21/2020-large necrotic mass centered in the posterior wall of the stomach, irregular hypodense mass right lobe liver  Upper endoscopy 01/24/2020-large infiltrative mass with no bleeding found in the posterior wall of the stomach.  Biopsy-gastrointestinal stromal tumor  MRI liver 01/28/2020-large exophytic centrally necrotic 17.3 x 12.3 cm gastric mass arising from posterior proximal stomach centered in the left upper quadrant with thick enhancing wall.  Complex multilocular 10.7 x 8.2 cm cystic liver mass right liver lobe.   Neoadjuvant Gleevec 01/28/2020 CT 03/03/2020, resolution of  fluid component of right hepatic abscess, unchanged mass posterior to the gastric body, unchanged left periaortic and aortocaval adenopathy CT 06/01/2020-decreased size of a multilobulated mass arising from the posterior stomach, unchanged retroperitoneal lymph nodes, removal of liver drain resolution of abscess CT 09/14/20-no significant change in the large left upper quadrant mass arising from the proximal stomach.  Near complete resolution of right hepatic lobe abscess. CT 06/06/2021-no change in mass arising from the posterior gastric fundus, no lymphadenopathy or evidence of metastatic disease Patient discontinued Gleevec around September 2022 per his report CT 12/19/2021-significant interval increase in size of the large gastric mass which is partially necrotic, measures 16.5 x 14 cm compared to previous measurement of 9 x 7.6 cm.  Lumen of the stomach is significantly compressed.  Also mass-effect on the pancreas, spleen, left kidney and left adrenal gland.  No definite findings for direct invasion of these organs.  No findings of hepatic metastatic disease.  Stable small scattered mesenteric and retroperitoneal lymph nodes. 12/25/2021 Gleevec 400 mg daily CT abdomen/pelvis 03/15/2022-interval 67% reduction in volume of the large left upper quadrant GI stromal tumor, scattered small retroperitoneal lymphadenopathy similar to prior CT abdomen/pelvis 04/15/2022-large exophytic mass arising off the posterior wall of the stomach noted and compared to prior exam there has been a mild decrease in tumor volume, signs of internal necrosis, new gas identified within the dominant chronic component. 2. Liver lesion in the right lobe             -Liver mass consistent with abscess on MRI of the liver from  01/27/2020             -Status post CT-guided drainage of the liver abscess on 01/28/2020-culture positive for Streptococcus intermedius             -IV antibiotics and discharged home on 10-day course of Augmentin              -CT 03/03/2020-clearance of fluid component of liver abscess, unchanged mass posterior to the gastric body, stable left periaortic and aortocaval adenopathy             -Abscess drain removed 03/09/2020             -Liver abscess resolved on CT 06/01/2020 and 09/14/2020 3. Iron deficiency anemia-oral iron,  resolved Anemia on hospital admission 04/15/2022-likely secondary to sepsis and phlebotomy 4. Gastritis and H. pylori infection 5.  History of fever secondary to the liver abscess 6. Protein calorie malnutrition-resolved 7.  Hospital admission 04/15/2022-sepsis, blood culture positive for Staph epidermidis    Disposition: Kevin [name] appears stable.  He continues [place].  He is scheduled for surgery 05/29/2022.  His daughter was given instructions to hold [place] 3 days prior to surgery with the plan to resume following surgery once he is tolerating a diet.  We discussed the plan to continue [place] for approximately 1 year after surgery.  It is unclear if he will agree to resuming [place] after surgery.  He will return for follow-up the week of 06/24/2022.  We are available to see him sooner if needed.  Patient seen with Dr. Benay Spice.   Ned Card ANP/GNP-BC   05/09/2022  11:11 AM  This was a shared visit with Ned Card.  Kevin [name] is scheduled for surgery later this month.  We stressed our recommendation to continue [place] following surgery.  I was present for greater than 50% today's visit.  I performed medical decision making.  Kevin Manson, MD

## 2022-05-13 ENCOUNTER — Telehealth: Payer: Self-pay

## 2022-05-13 ENCOUNTER — Other Ambulatory Visit: Payer: Self-pay | Admitting: *Deleted

## 2022-05-13 ENCOUNTER — Other Ambulatory Visit (HOSPITAL_COMMUNITY): Payer: Self-pay

## 2022-05-13 ENCOUNTER — Other Ambulatory Visit: Payer: Self-pay | Admitting: Nurse Practitioner

## 2022-05-13 DIAGNOSIS — C49A2 Gastrointestinal stromal tumor of stomach: Secondary | ICD-10-CM

## 2022-05-13 MED ORDER — POTASSIUM CHLORIDE CRYS ER 20 MEQ PO TBCR
20.0000 meq | EXTENDED_RELEASE_TABLET | Freq: Every day | ORAL | 0 refills | Status: DC
  Filled 2022-05-13: qty 20, 20d supply, fill #0

## 2022-05-13 NOTE — Telephone Encounter (Signed)
-----   Message from Owens Shark, NP sent at 05/13/2022 12:22 PM EDT ----- Please let him know potassium level is decreased.  I am sending a prescription to his pharmacy for K-Dur 20 mill equivalents daily.  Please schedule for repeat BMP in 2 weeks.

## 2022-05-13 NOTE — Telephone Encounter (Signed)
Called and left a message on the voicemail. To let the patient know potassium level is decreased and a prescription to his pharmacy for K-Dur 20 mill equivalents daily.

## 2022-05-16 ENCOUNTER — Other Ambulatory Visit: Payer: Self-pay | Admitting: Oncology

## 2022-05-16 ENCOUNTER — Other Ambulatory Visit (HOSPITAL_COMMUNITY): Payer: Self-pay

## 2022-05-16 MED ORDER — IMATINIB MESYLATE 100 MG PO TABS
ORAL_TABLET | ORAL | 0 refills | Status: DC
  Filled 2022-05-16: qty 120, 30d supply, fill #0

## 2022-05-21 NOTE — Pre-Procedure Instructions (Signed)
Surgical Instructions    Your procedure is scheduled on Wednesday 05/29/22.   Report to [facility] Main Entrance "A" at 06:30 A.M., then check in with the Admitting office.  Call this number if you have problems the morning of surgery:  [phone]   If you have any questions prior to your surgery date call [phone]: Open Monday-Friday 8am-4pm    Remember:  Do not eat after midnight the night before your surgery  You may drink clear liquids until 05:30 the morning of your surgery.   Clear liquids allowed are: Water, Non-Citrus Juices (without pulp), Carbonated Beverages, Clear Tea, Black Coffee ONLY (NO MILK, CREAM OR POWDERED CREAMER of any kind), and Gatorade  Patient Instructions  The night before surgery:  No food after midnight. ONLY clear liquids after midnight Drink one Pre-Surgery Clear Ensure with dinner and drink one Pre-Surgery Clear Ensure before going to bed.  The day of surgery (if you do NOT have diabetes):  Drink ONE (1) Pre-Surgery Clear Ensure by 05:30 A.M. the morning of surgery. Drink in one sitting. Do not sip.  This drink was given to you during your hospital  pre-op appointment visit.  Nothing else to drink after completing the  Pre-Surgery Clear Ensure.         If you have questions, please contact your surgeon's office.     Take these medicines the morning of surgery with A SIP OF WATER:   amLODipine (NORVASC)  potassium chloride SA (KLOR-CON M)    Take these medicines if needed:   acetaminophen (TYLENOL)   Please follow your surgeon's instructions regarding imatinib (GLEEVEC). If you have not received instructions then please contact your surgeon's office for instructions.   As of today, STOP taking any Aspirin (unless otherwise instructed by your surgeon) Aleve, Naproxen, Ibuprofen, Motrin, Advil, Goody's, BC's, all herbal medications, fish oil, and all vitamins.           Do not wear jewelry or makeup Do not wear lotions, powders,  perfumes/colognes, or deodorant. Do not shave 48 hours prior to surgery.  Men may shave face and neck. Do not bring valuables to the hospital. Do not wear nail polish, gel polish, artificial nails, or any other type of covering on natural nails (fingers and toes) If you have artificial nails or gel coating that need to be removed by a nail salon, please have this removed prior to surgery. Artificial nails or gel coating may interfere with anesthesia's ability to adequately monitor your vital signs.  [facility] is not responsible for any belongings or valuables. .   Do NOT Smoke (Tobacco/Vaping)  24 hours prior to your procedure  If you use a CPAP at night, you may bring your mask for your overnight stay.   Contacts, glasses, hearing aids, dentures or partials may not be worn into surgery, please bring cases for these belongings   For patients admitted to the hospital, discharge time will be determined by your treatment team.   Patients discharged the day of surgery will not be allowed to drive home, and someone needs to stay with them for 24 hours.   SURGICAL WAITING ROOM VISITATION Patients having surgery or a procedure in a hospital may have two support people. Children under the age of 65 must have an adult with them who is not the patient. They may stay in the waiting area during the procedure and may switch out with other visitors. If the patient needs to stay at the hospital during part of  their recovery, the visitor guidelines for inpatient rooms apply.  Please refer to the [facility] website for the visitor guidelines for Inpatients (after your surgery is over and you are in a regular room).       Special instructions:    Oral Hygiene is also important to reduce your risk of infection.  Remember - BRUSH YOUR TEETH THE MORNING OF SURGERY WITH YOUR REGULAR TOOTHPASTE   [facility]- Preparing For Surgery  Before surgery, you can play an important role. Because skin is not  sterile, your skin needs to be as free of germs as possible. You can reduce the number of germs on your skin by washing with CHG (chlorahexidine gluconate) Soap before surgery.  CHG is an antiseptic cleaner which kills germs and bonds with the skin to continue killing germs even after washing.     Please do not use if you have an allergy to CHG or antibacterial soaps. If your skin becomes reddened/irritated stop using the CHG.  Do not shave (including legs and underarms) for at least 48 hours prior to first CHG shower. It is OK to shave your face.  Please follow these instructions carefully.     Shower the NIGHT BEFORE SURGERY and the MORNING OF SURGERY with CHG Soap.   If you chose to wash your hair, wash your hair first as usual with your normal shampoo. After you shampoo, rinse your hair and body thoroughly to remove the shampoo.  Then [employer] and genitals (private parts) with your normal soap and rinse thoroughly to remove soap.  After that Use CHG Soap as you would any other liquid soap. You can apply CHG directly to the skin and wash gently with a scrungie or a clean washcloth.   Apply the CHG Soap to your body ONLY FROM THE NECK DOWN.  Do not use on open wounds or open sores. Avoid contact with your eyes, ears, mouth and genitals (private parts). Wash Face and genitals (private parts)  with your normal soap.   Wash thoroughly, paying special attention to the area where your surgery will be performed.  Thoroughly rinse your body with warm water from the neck down.  DO NOT shower/wash with your normal soap after using and rinsing off the CHG Soap.  Pat yourself dry with a CLEAN TOWEL.  Wear CLEAN PAJAMAS to bed the night before surgery  Place CLEAN SHEETS on your bed the night before your surgery  DO NOT SLEEP WITH PETS.   Day of Surgery:  Take a shower with CHG soap. Wear Clean/Comfortable clothing the morning of surgery Do not apply any deodorants/lotions.   Remember to  brush your teeth WITH YOUR REGULAR TOOTHPASTE.    If you received a COVID test during your pre-op visit, it is requested that you wear a mask when out in public, stay away from anyone that may not be feeling well, and notify your surgeon if you develop symptoms. If you have been in contact with anyone that has tested positive in the last 10 days, please notify your surgeon.    Please read over the following fact sheets that you were given.

## 2022-05-22 ENCOUNTER — Inpatient Hospital Stay (HOSPITAL_COMMUNITY)
Admission: RE | Admit: 2022-05-22 | Discharge: 2022-05-22 | Disposition: A | Discharge status: Home / Self Care | Payer: Medicare Other | Source: Ambulatory Visit

## 2022-05-27 ENCOUNTER — Other Ambulatory Visit: Payer: Self-pay

## 2022-05-27 ENCOUNTER — Encounter (HOSPITAL_COMMUNITY): Payer: Self-pay

## 2022-05-27 ENCOUNTER — Encounter (HOSPITAL_COMMUNITY)
Admission: RE | Admit: 2022-05-27 | Discharge: 2022-05-27 | Disposition: A | Discharge status: Home / Self Care | Payer: Medicare Other | Source: Ambulatory Visit | Attending: Surgery | Admitting: Surgery

## 2022-05-27 ENCOUNTER — Other Ambulatory Visit (HOSPITAL_COMMUNITY): Payer: Self-pay

## 2022-05-27 VITALS — BP 164/84 | HR 97 | Temp 98.2°F | Resp 18 | Ht 61.0 in | Wt 147.5 lb

## 2022-05-27 DIAGNOSIS — D649 Anemia, unspecified: Secondary | ICD-10-CM | POA: Insufficient documentation

## 2022-05-27 DIAGNOSIS — Z01812 Encounter for preprocedural laboratory examination: Secondary | ICD-10-CM | POA: Insufficient documentation

## 2022-05-27 DIAGNOSIS — C49A2 Gastrointestinal stromal tumor of stomach: Secondary | ICD-10-CM | POA: Insufficient documentation

## 2022-05-27 DIAGNOSIS — I10 Essential (primary) hypertension: Secondary | ICD-10-CM | POA: Insufficient documentation

## 2022-05-27 LAB — CBC
HCT: 29.5 % — ABNORMAL LOW (ref 39.0–52.0)
Hemoglobin: 9.2 g/dL — ABNORMAL LOW (ref 13.0–17.0)
MCH: 27.4 pg (ref 26.0–34.0)
MCHC: 31.2 g/dL (ref 30.0–36.0)
MCV: 87.8 fL (ref 80.0–100.0)
Platelets: 315 10*3/uL (ref 150–400)
RBC: 3.36 MIL/uL — ABNORMAL LOW (ref 4.22–5.81)
RDW: 16.5 % — ABNORMAL HIGH (ref 11.5–15.5)
WBC: 4.4 10*3/uL (ref 4.0–10.5)
nRBC: 0 % (ref 0.0–0.2)

## 2022-05-27 LAB — BASIC METABOLIC PANEL
Anion gap: 8 (ref 5–15)
BUN: 18 mg/dL (ref 8–23)
CO2: 24 mmol/L (ref 22–32)
Calcium: 8.3 mg/dL — ABNORMAL LOW (ref 8.9–10.3)
Chloride: 107 mmol/L (ref 98–111)
Creatinine, Ser: 0.95 mg/dL (ref 0.61–1.24)
GFR, Estimated: >60 mL/min (ref >60)
Glucose, Bld: 116 mg/dL — ABNORMAL HIGH (ref 70–99)
Potassium: 3.2 mmol/L — ABNORMAL LOW (ref 3.5–5.1)
Sodium: 139 mmol/L (ref 135–145)

## 2022-05-27 LAB — PREPARE RBC (CROSSMATCH)

## 2022-05-27 NOTE — Progress Notes (Signed)
PCP - denies Cardiologist - denies  PPM/ICD - denies   Chest x-ray - 04/15/22 EKG - 04/17/22 Stress Test - denies ECHO - denies Cardiac Cath - denies  Sleep Study - denies   DM- denies  ASA/Blood Thinner Instructions: n/a   ERAS Protcol - yes PRE-SURGERY Ensure x 3  COVID TEST- n/a   Anesthesia review: no  Patient denies shortness of breath, fever, cough and chest pain at PAT appointment   All instructions explained to the patient, with a verbal understanding of the material. Patient agrees to go over the instructions while at home for a better understanding. Patient also instructed to notify surgeon of any contact with COVID+ person or if he develops any symptoms. The opportunity to ask questions was provided.

## 2022-05-28 ENCOUNTER — Encounter (HOSPITAL_COMMUNITY): Payer: Self-pay | Admitting: Surgery

## 2022-05-28 NOTE — Anesthesia Preprocedure Evaluation (Signed)
Anesthesia Evaluation  Patient identified by MRN, date of birth, ID band Patient awake    Reviewed: Allergy & Precautions, H&P , NPO status , Patient's Chart, lab work & pertinent test results  Airway Mallampati: I  TM Distance: >3 FB Neck ROM: Full    Dental no notable dental hx. (+) Poor Dentition, Chipped, Missing,    Pulmonary neg pulmonary ROS, former smoker,    Pulmonary exam normal breath sounds clear to auscultation       Cardiovascular Exercise Tolerance: Good hypertension, Pt. on medications Normal cardiovascular exam Rhythm:Regular Rate:Normal     Neuro/Psych negative neurological ROS  negative psych ROS   GI/Hepatic negative GI ROS, Neg liver ROS,   Endo/Other  negative endocrine ROS  Renal/GU negative Renal ROS  negative genitourinary   Musculoskeletal negative musculoskeletal ROS (+)   Abdominal   Peds negative pediatric ROS (+)  Hematology  (+) Blood dyscrasia, anemia ,   Anesthesia Other Findings Malignant gastrointestinal stromal tumor (GIST) of stomach   Reproductive/Obstetrics negative OB ROS                            Anesthesia Physical Anesthesia Plan  ASA: 4  Anesthesia Plan: General   Post-op Pain Management:    Induction: Intravenous  PONV Risk Score and Plan: 2 and Treatment may vary due to age or medical condition and Ondansetron  Airway Management Planned: Oral ETT  Additional Equipment: ClearSight  Intra-op Plan:   Post-operative Plan: Extubation in OR  Informed Consent: I have reviewed the patients History and Physical, chart, labs and discussed the procedure including the risks, benefits and alternatives for the proposed anesthesia with the patient or authorized representative who has indicated his/her understanding and acceptance.       Plan Discussed with: Anesthesiologist  Anesthesia Plan Comments: (  2 lb IV.  Clearsight vs )         Anesthesia Quick Evaluation

## 2022-05-29 ENCOUNTER — Other Ambulatory Visit: Payer: Self-pay

## 2022-05-29 ENCOUNTER — Inpatient Hospital Stay (HOSPITAL_COMMUNITY): Payer: Medicare Other | Admitting: Certified Registered Nurse Anesthetist

## 2022-05-29 ENCOUNTER — Encounter (HOSPITAL_COMMUNITY): Payer: Self-pay | Admitting: Surgery

## 2022-05-29 ENCOUNTER — Inpatient Hospital Stay (HOSPITAL_COMMUNITY)
Admission: RE | Admit: 2022-05-29 | Discharge: 2022-06-05 | DRG: 328 | Disposition: A | Discharge status: Home / Self Care | Payer: Medicare Other | Attending: Surgery | Admitting: Surgery

## 2022-05-29 ENCOUNTER — Encounter (HOSPITAL_COMMUNITY)
Admission: RE | Disposition: A | Discharge status: Home / Self Care | Payer: Self-pay | Source: Home / Self Care | Attending: Surgery

## 2022-05-29 DIAGNOSIS — R509 Fever, unspecified: Secondary | ICD-10-CM | POA: Diagnosis present

## 2022-05-29 DIAGNOSIS — Z5331 Laparoscopic surgical procedure converted to open procedure: Secondary | ICD-10-CM

## 2022-05-29 DIAGNOSIS — Z85831 Personal history of malignant neoplasm of soft tissue: Secondary | ICD-10-CM | POA: Diagnosis not present

## 2022-05-29 DIAGNOSIS — I10 Essential (primary) hypertension: Secondary | ICD-10-CM

## 2022-05-29 DIAGNOSIS — C49A0 Gastrointestinal stromal tumor, unspecified site: Secondary | ICD-10-CM | POA: Diagnosis present

## 2022-05-29 DIAGNOSIS — Z87891 Personal history of nicotine dependence: Secondary | ICD-10-CM | POA: Diagnosis not present

## 2022-05-29 DIAGNOSIS — C49A2 Gastrointestinal stromal tumor of stomach: Secondary | ICD-10-CM

## 2022-05-29 DIAGNOSIS — Z79899 Other long term (current) drug therapy: Secondary | ICD-10-CM

## 2022-05-29 DIAGNOSIS — Z23 Encounter for immunization: Secondary | ICD-10-CM

## 2022-05-29 DIAGNOSIS — Z20822 Contact with and (suspected) exposure to covid-19: Secondary | ICD-10-CM | POA: Diagnosis not present

## 2022-05-29 DIAGNOSIS — R531 Weakness: Secondary | ICD-10-CM | POA: Diagnosis not present

## 2022-05-29 DIAGNOSIS — E876 Hypokalemia: Secondary | ICD-10-CM | POA: Diagnosis not present

## 2022-05-29 DIAGNOSIS — D630 Anemia in neoplastic disease: Secondary | ICD-10-CM

## 2022-05-29 DIAGNOSIS — R2689 Other abnormalities of gait and mobility: Secondary | ICD-10-CM | POA: Diagnosis not present

## 2022-05-29 DIAGNOSIS — A419 Sepsis, unspecified organism: Secondary | ICD-10-CM | POA: Diagnosis not present

## 2022-05-29 LAB — HEMOGLOBIN A1C
Hgb A1c MFr Bld: 5.4 % (ref 4.8–5.6)
Mean Plasma Glucose: 108.28 mg/dL

## 2022-05-29 LAB — BASIC METABOLIC PANEL
Anion gap: 8 (ref 5–15)
BUN: 9 mg/dL (ref 8–23)
CO2: 21 mmol/L — ABNORMAL LOW (ref 22–32)
Calcium: 7.4 mg/dL — ABNORMAL LOW (ref 8.9–10.3)
Chloride: 107 mmol/L (ref 98–111)
Creatinine, Ser: 0.99 mg/dL (ref 0.61–1.24)
GFR, Estimated: >60 mL/min (ref >60)
Glucose, Bld: 235 mg/dL — ABNORMAL HIGH (ref 70–99)
Potassium: 3.1 mmol/L — ABNORMAL LOW (ref 3.5–5.1)
Sodium: 136 mmol/L (ref 135–145)

## 2022-05-29 LAB — CBC
HCT: 27.8 % — ABNORMAL LOW (ref 39.0–52.0)
Hemoglobin: 9.4 g/dL — ABNORMAL LOW (ref 13.0–17.0)
MCH: 28.5 pg (ref 26.0–34.0)
MCHC: 33.8 g/dL (ref 30.0–36.0)
MCV: 84.2 fL (ref 80.0–100.0)
Platelets: 354 10*3/uL (ref 150–400)
RBC: 3.3 MIL/uL — ABNORMAL LOW (ref 4.22–5.81)
RDW: 16.3 % — ABNORMAL HIGH (ref 11.5–15.5)
WBC: 9.4 10*3/uL (ref 4.0–10.5)
nRBC: 0 % (ref 0.0–0.2)

## 2022-05-29 LAB — GLUCOSE, CAPILLARY
Glucose-Capillary: 235 mg/dL — ABNORMAL HIGH (ref 70–99)
Glucose-Capillary: 198 mg/dL — ABNORMAL HIGH (ref 70–99)

## 2022-05-29 SURGERY — GASTRECTOMY, PARTIAL
Anesthesia: General | Site: Abdomen

## 2022-05-29 MED ORDER — PHENYLEPHRINE HCL-NACL 20-0.9 MG/250ML-% IV SOLN
INTRAVENOUS | Status: DC | PRN
  Administered 2022-05-29: 20 ug/min via INTRAVENOUS

## 2022-05-29 MED ORDER — HAEMOPHILUS B POLYSAC CONJ VAC IM SOLR
0.5000 mL | Freq: Once | INTRAMUSCULAR | Status: AC | PRN
  Administered 2022-06-05: 0.5 mL via INTRAMUSCULAR
  Filled 2022-05-29: qty 0.5

## 2022-05-29 MED ORDER — ENSURE PRE-SURGERY PO LIQD: 592.0000 mL | Freq: Once | ORAL | Status: DC

## 2022-05-29 MED ORDER — ACETAMINOPHEN 10 MG/ML IV SOLN
1000.0000 mg | Freq: Three times a day (TID) | INTRAVENOUS | Status: AC
  Administered 2022-05-29 – 2022-05-30 (×3): 1000 mg via INTRAVENOUS
  Filled 2022-05-29 (×3): qty 100

## 2022-05-29 MED ORDER — MENINGOCOCCAL A C Y&W-135 OLIG IM SOLR
0.5000 mL | Freq: Once | INTRAMUSCULAR | Status: AC | PRN
  Administered 2022-06-05: 0.5 mL via INTRAMUSCULAR
  Filled 2022-05-29: qty 0.5

## 2022-05-29 MED ORDER — DIPHENHYDRAMINE HCL 50 MG/ML IJ SOLN: 12.5000 mg | Freq: Four times a day (QID) | INTRAMUSCULAR | Status: DC | PRN

## 2022-05-29 MED ORDER — FENTANYL CITRATE (PF) 250 MCG/5ML IJ SOLN
INTRAMUSCULAR | Status: AC
  Filled 2022-05-29: qty 5

## 2022-05-29 MED ORDER — PNEUMOCOCCAL 20-VAL CONJ VACC 0.5 ML IM SUSY
0.5000 mL | PREFILLED_SYRINGE | Freq: Once | INTRAMUSCULAR | Status: AC | PRN
  Administered 2022-06-05: 0.5 mL via INTRAMUSCULAR
  Filled 2022-05-29: qty 0.5

## 2022-05-29 MED ORDER — MENINGOCOCCAL VAC B (OMV) IM SUSY
0.5000 mL | PREFILLED_SYRINGE | Freq: Once | INTRAMUSCULAR | Status: AC | PRN
  Administered 2022-06-05: 0.5 mL via INTRAMUSCULAR
  Filled 2022-05-29: qty 0.5

## 2022-05-29 MED ORDER — MEPERIDINE HCL 25 MG/ML IJ SOLN: 6.2500 mg | INTRAMUSCULAR | Status: DC | PRN

## 2022-05-29 MED ORDER — CHLORHEXIDINE GLUCONATE 0.12 % MT SOLN
15.0000 mL | Freq: Once | OROMUCOSAL | Status: AC
  Administered 2022-05-29: 15 mL via OROMUCOSAL
  Filled 2022-05-29: qty 15

## 2022-05-29 MED ORDER — 0.9 % SODIUM CHLORIDE (POUR BTL) OPTIME
TOPICAL | Status: DC | PRN
  Administered 2022-05-29 (×3): 1000 mL

## 2022-05-29 MED ORDER — PROPOFOL 10 MG/ML IV BOLUS
INTRAVENOUS | Status: DC | PRN
  Administered 2022-05-29: 180 mg via INTRAVENOUS

## 2022-05-29 MED ORDER — SUGAMMADEX SODIUM 200 MG/2ML IV SOLN
INTRAVENOUS | Status: DC | PRN
  Administered 2022-05-29: 200 mg via INTRAVENOUS

## 2022-05-29 MED ORDER — ONDANSETRON HCL 4 MG/2ML IJ SOLN
INTRAMUSCULAR | Status: DC | PRN
  Administered 2022-05-29: 4 mg via INTRAVENOUS

## 2022-05-29 MED ORDER — HYDROMORPHONE HCL 1 MG/ML IJ SOLN
INTRAMUSCULAR | Status: DC | PRN
  Administered 2022-05-29: .5 mg via INTRAVENOUS

## 2022-05-29 MED ORDER — PROPOFOL 10 MG/ML IV BOLUS
INTRAVENOUS | Status: AC
Start: 2022-05-29 — End: ?
  Filled 2022-05-29: qty 20

## 2022-05-29 MED ORDER — BUPIVACAINE-EPINEPHRINE (PF) 0.25% -1:200000 IJ SOLN
INTRAMUSCULAR | Status: AC
  Filled 2022-05-29: qty 30

## 2022-05-29 MED ORDER — HEMOSTATIC AGENTS (NO CHARGE) OPTIME
TOPICAL | Status: DC | PRN
  Administered 2022-05-29: 1 via TOPICAL

## 2022-05-29 MED ORDER — OXYCODONE HCL 5 MG/5ML PO SOLN: 5.0000 mg | Freq: Once | ORAL | Status: DC | PRN

## 2022-05-29 MED ORDER — FENTANYL CITRATE (PF) 100 MCG/2ML IJ SOLN: 25.0000 ug | INTRAMUSCULAR | Status: DC | PRN

## 2022-05-29 MED ORDER — BUPIVACAINE-EPINEPHRINE (PF) 0.25% -1:200000 IJ SOLN
INTRAMUSCULAR | Status: DC | PRN
  Administered 2022-05-29: 9 mL

## 2022-05-29 MED ORDER — HYDROMORPHONE HCL 1 MG/ML IJ SOLN
INTRAMUSCULAR | Status: AC
  Filled 2022-05-29: qty 0.5

## 2022-05-29 MED ORDER — SODIUM CHLORIDE 0.9 % IV SOLN: INTRAVENOUS | Status: DC | PRN

## 2022-05-29 MED ORDER — SODIUM CHLORIDE 0.9% FLUSH: 9.0000 mL | INTRAVENOUS | Status: DC | PRN

## 2022-05-29 MED ORDER — HYDROMORPHONE 1 MG/ML IV SOLN
INTRAVENOUS | Status: DC
  Administered 2022-05-31: 2 mg via INTRAVENOUS
  Filled 2022-05-29: qty 30

## 2022-05-29 MED ORDER — POTASSIUM CHLORIDE 10 MEQ/100ML IV SOLN
10.0000 meq | INTRAVENOUS | Status: AC
  Administered 2022-05-29 (×4): 10 meq via INTRAVENOUS
  Filled 2022-05-29 (×4): qty 100

## 2022-05-29 MED ORDER — INSULIN ASPART 100 UNIT/ML IJ SOLN
0.0000 [IU] | INTRAMUSCULAR | Status: DC
  Administered 2022-05-29: 3 [IU] via SUBCUTANEOUS
  Administered 2022-05-30 (×2): 2 [IU] via SUBCUTANEOUS
  Administered 2022-05-30 (×2): 1 [IU] via SUBCUTANEOUS
  Administered 2022-05-30: 2 [IU] via SUBCUTANEOUS
  Administered 2022-05-30: 1 [IU] via SUBCUTANEOUS
  Administered 2022-05-31: 2 [IU] via SUBCUTANEOUS
  Administered 2022-05-31 – 2022-06-01 (×6): 1 [IU] via SUBCUTANEOUS
  Administered 2022-06-02: 2 [IU] via SUBCUTANEOUS
  Administered 2022-06-02 – 2022-06-03 (×2): 1 [IU] via SUBCUTANEOUS

## 2022-05-29 MED ORDER — LACTATED RINGERS IV SOLN: INTRAVENOUS | Status: DC | PRN

## 2022-05-29 MED ORDER — ORAL CARE MOUTH RINSE
15.0000 mL | Freq: Once | OROMUCOSAL | Status: AC
Start: 2022-05-29 — End: 2022-05-29

## 2022-05-29 MED ORDER — EPHEDRINE SULFATE-NACL 50-0.9 MG/10ML-% IV SOSY
PREFILLED_SYRINGE | INTRAVENOUS | Status: DC | PRN
  Administered 2022-05-29: 5 mg via INTRAVENOUS

## 2022-05-29 MED ORDER — ENOXAPARIN SODIUM 40 MG/0.4ML IJ SOSY
40.0000 mg | PREFILLED_SYRINGE | INTRAMUSCULAR | Status: DC
  Administered 2022-05-30 – 2022-06-05 (×7): 40 mg via SUBCUTANEOUS
  Filled 2022-05-29 (×7): qty 0.4

## 2022-05-29 MED ORDER — OXYCODONE HCL 5 MG PO TABS: 5.0000 mg | ORAL_TABLET | Freq: Once | ORAL | Status: DC | PRN

## 2022-05-29 MED ORDER — METRONIDAZOLE 500 MG/100ML IV SOLN
500.0000 mg | INTRAVENOUS | Status: AC
  Administered 2022-05-29: 500 mg via INTRAVENOUS
  Filled 2022-05-29: qty 100

## 2022-05-29 MED ORDER — ACETAMINOPHEN 325 MG PO TABS: 325.0000 mg | ORAL_TABLET | ORAL | Status: DC | PRN

## 2022-05-29 MED ORDER — ONDANSETRON 4 MG PO TBDP: 4.0000 mg | ORAL_TABLET | Freq: Four times a day (QID) | ORAL | Status: DC | PRN

## 2022-05-29 MED ORDER — SODIUM CHLORIDE 0.9 % IV SOLN
2.0000 g | INTRAVENOUS | Status: AC
  Administered 2022-05-29: 2 g via INTRAVENOUS
  Filled 2022-05-29: qty 20

## 2022-05-29 MED ORDER — LACTATED RINGERS IV SOLN: INTRAVENOUS | Status: DC

## 2022-05-29 MED ORDER — NALOXONE HCL 0.4 MG/ML IJ SOLN: 0.4000 mg | INTRAMUSCULAR | Status: DC | PRN

## 2022-05-29 MED ORDER — ROCURONIUM BROMIDE 10 MG/ML (PF) SYRINGE
PREFILLED_SYRINGE | INTRAVENOUS | Status: DC | PRN
  Administered 2022-05-29: 30 mg via INTRAVENOUS
  Administered 2022-05-29: 20 mg via INTRAVENOUS
  Administered 2022-05-29: 100 mg via INTRAVENOUS

## 2022-05-29 MED ORDER — DEXAMETHASONE SODIUM PHOSPHATE 10 MG/ML IJ SOLN
INTRAMUSCULAR | Status: DC | PRN
  Administered 2022-05-29: 10 mg via INTRAVENOUS

## 2022-05-29 MED ORDER — ACETAMINOPHEN 160 MG/5ML PO SOLN: 325.0000 mg | ORAL | Status: DC | PRN

## 2022-05-29 MED ORDER — PHENYLEPHRINE 80 MCG/ML (10ML) SYRINGE FOR IV PUSH (FOR BLOOD PRESSURE SUPPORT)
PREFILLED_SYRINGE | INTRAVENOUS | Status: DC | PRN
  Administered 2022-05-29 (×3): 160 ug via INTRAVENOUS
  Administered 2022-05-29: 80 ug via INTRAVENOUS
  Administered 2022-05-29: 160 ug via INTRAVENOUS

## 2022-05-29 MED ORDER — ALBUMIN HUMAN 5 % IV SOLN: INTRAVENOUS | Status: DC | PRN

## 2022-05-29 MED ORDER — FENTANYL CITRATE (PF) 250 MCG/5ML IJ SOLN
INTRAMUSCULAR | Status: DC | PRN
Start: 2022-05-29 — End: 2022-05-29
  Administered 2022-05-29 (×3): 25 ug via INTRAVENOUS
  Administered 2022-05-29: 50 ug via INTRAVENOUS
  Administered 2022-05-29: 100 ug via INTRAVENOUS
  Administered 2022-05-29: 25 ug via INTRAVENOUS
  Administered 2022-05-29: 50 ug via INTRAVENOUS

## 2022-05-29 MED ORDER — ONDANSETRON HCL 4 MG/2ML IJ SOLN: 4.0000 mg | Freq: Four times a day (QID) | INTRAMUSCULAR | Status: DC | PRN

## 2022-05-29 MED ORDER — ONDANSETRON HCL 4 MG/2ML IJ SOLN: 4.0000 mg | Freq: Once | INTRAMUSCULAR | Status: DC | PRN

## 2022-05-29 MED ORDER — ENSURE PRE-SURGERY PO LIQD: 296.0000 mL | Freq: Once | ORAL | Status: DC

## 2022-05-29 MED ORDER — LIDOCAINE 2% (20 MG/ML) 5 ML SYRINGE
INTRAMUSCULAR | Status: DC | PRN
  Administered 2022-05-29: 100 mg via INTRAVENOUS

## 2022-05-29 SURGICAL SUPPLY — 146 items
ADH SKN CLS APL DERMABOND .7 (GAUZE/BANDAGES/DRESSINGS) ×1
APL PRP STRL LF DISP 70% ISPRP (MISCELLANEOUS) ×1
APPLIER CLIP 11 MED OPEN (CLIP)
APPLIER CLIP 13 LRG OPEN (CLIP)
APR CLP LRG 13 20 CLIP (CLIP)
APR CLP MED 11 20 MLT OPN (CLIP)
BAG BILE T-TUBES STRL (MISCELLANEOUS) IMPLANT
BAG COUNTER SPONGE SURGICOUNT (BAG) ×2 IMPLANT
BAG DRN 9.5 2 ADJ BELT ADPR (MISCELLANEOUS)
BAG SPNG CNTER NS LX DISP (BAG) ×1
BIOPATCH RED 1 DISK 7.0 (GAUZE/BANDAGES/DRESSINGS) ×2 IMPLANT
BLADE CLIPPER SURG (BLADE) IMPLANT
BLADE EXTENDED COATED 6.5IN (ELECTRODE) IMPLANT
BOOT SUTURE AID YELLOW STND (SUTURE) IMPLANT
CANISTER SUCT 3000ML PPV (MISCELLANEOUS) ×2 IMPLANT
CHLORAPREP W/TINT 26 (MISCELLANEOUS) ×2 IMPLANT
CLIP APPLIE 11 MED OPEN (CLIP) IMPLANT
CLIP APPLIE 13 LRG OPEN (CLIP) IMPLANT
CLIP LIGATING HEMOLOK MED (MISCELLANEOUS) ×1 IMPLANT
CLIP TI MEDIUM 24 (CLIP) ×2 IMPLANT
CLIP TI WIDE RED SMALL 24 (CLIP) ×2 IMPLANT
CLIP VESOCCLUDE LG 6/CT (CLIP) ×2 IMPLANT
CLIP VESOCCLUDE SM WIDE 24/CT (CLIP) ×2 IMPLANT
CONT SPEC 4OZ CLIKSEAL STRL BL (MISCELLANEOUS) ×2 IMPLANT
COVER SURGICAL LIGHT HANDLE (MISCELLANEOUS) ×2 IMPLANT
DERMABOND ADVANCED (GAUZE/BANDAGES/DRESSINGS) ×1
DERMABOND ADVANCED .7 DNX12 (GAUZE/BANDAGES/DRESSINGS) ×1 IMPLANT
DRAIN CHANNEL 19F RND (DRAIN) ×1 IMPLANT
DRAIN PENROSE 0.5X18 (DRAIN) IMPLANT
DRAPE INCISE IOBAN 66X45 STRL (DRAPES) ×2 IMPLANT
DRAPE LAPAROSCOPIC ABDOMINAL (DRAPES) ×2 IMPLANT
DRAPE WARM FLUID 44X44 (DRAPES) ×2 IMPLANT
DRSG TEGADERM 4X4.5 CHG (GAUZE/BANDAGES/DRESSINGS) ×1 IMPLANT
DRSG TEGADERM 4X4.75 (GAUZE/BANDAGES/DRESSINGS) ×1 IMPLANT
DRSG TELFA 3X8 NADH (GAUZE/BANDAGES/DRESSINGS) IMPLANT
ELECT BLADE 6.5 EXT (BLADE) ×2 IMPLANT
ELECT CAUTERY BLADE 6.4 (BLADE) ×2 IMPLANT
ELECT PAD DSPR THERM+ ADLT (MISCELLANEOUS) ×2 IMPLANT
ELECT REM PT RETURN 9FT ADLT (ELECTROSURGICAL) ×2
ELECTRODE REM PT RTRN 9FT ADLT (ELECTROSURGICAL) ×1 IMPLANT
EVACUATOR SILICONE 100CC (DRAIN) ×2 IMPLANT
GAUZE 4X4 16PLY ~~LOC~~+RFID DBL (SPONGE) IMPLANT
GAUZE SPONGE 4X4 12PLY STRL (GAUZE/BANDAGES/DRESSINGS) ×2 IMPLANT
GLOVE BIO SURGEON STRL SZ 6 (GLOVE) ×2 IMPLANT
GLOVE BIOGEL PI IND STRL 6 (GLOVE) ×1 IMPLANT
GLOVE BIOGEL PI INDICATOR 6 (GLOVE) ×1
GLOVE INDICATOR 6.5 STRL GRN (GLOVE) ×2 IMPLANT
GLOVE SURG POLY MICRO LF SZ5.5 (GLOVE) ×2 IMPLANT
GLOVE SURG UNDER POLY LF SZ6 (GLOVE) ×2 IMPLANT
GOWN STRL REUS W/ TWL LRG LVL3 (GOWN DISPOSABLE) ×3 IMPLANT
GOWN STRL REUS W/ TWL XL LVL3 (GOWN DISPOSABLE) ×1 IMPLANT
GOWN STRL REUS W/TWL 2XL LVL3 (GOWN DISPOSABLE) ×2 IMPLANT
GOWN STRL REUS W/TWL LRG LVL3 (GOWN DISPOSABLE) ×6
GOWN STRL REUS W/TWL XL LVL3 (GOWN DISPOSABLE) ×2
HAND PENCIL TRP OPTION (MISCELLANEOUS) ×3 IMPLANT
HANDLE SUCTION POOLE (INSTRUMENTS) ×1 IMPLANT
HEMOSTAT ARISTA ABSORB 3G PWDR (HEMOSTASIS) ×1 IMPLANT
HEMOSTAT SNOW SURGICEL 2X4 (HEMOSTASIS) ×1 IMPLANT
KIT BASIN OR (CUSTOM PROCEDURE TRAY) ×2 IMPLANT
KIT TUBE JEJUNAL 16FR (CATHETERS) IMPLANT
KIT TURNOVER KIT B (KITS) ×2 IMPLANT
LIGASURE IMPACT 36 18CM CVD LR (INSTRUMENTS) ×1 IMPLANT
LOOP VESSEL MAXI BLUE (MISCELLANEOUS) ×2 IMPLANT
LOOP VESSEL MINI RED (MISCELLANEOUS) ×2 IMPLANT
NDL INSUFFLATION 14GA 120MM (NEEDLE) ×1 IMPLANT
NEEDLE INSUFFLATION 14GA 120MM (NEEDLE) IMPLANT
NS IRRIG 1000ML POUR BTL (IV SOLUTION) ×4 IMPLANT
PACK GENERAL/GYN (CUSTOM PROCEDURE TRAY) ×2 IMPLANT
PAD ARMBOARD 7.5X6 YLW CONV (MISCELLANEOUS) ×4 IMPLANT
PAD DRESSING TELFA 3X8 NADH (GAUZE/BANDAGES/DRESSINGS) IMPLANT
PENCIL SMOKE EVACUATOR (MISCELLANEOUS) ×1 IMPLANT
RELOAD PROXIMATE 75MM BLUE (ENDOMECHANICALS) IMPLANT
RELOAD PROXIMATE 75MM GREEN (ENDOMECHANICALS) IMPLANT
RELOAD STAPLE 60 2.6 WHT THN (STAPLE) IMPLANT
RELOAD STAPLE 60 3.6 BLU REG (STAPLE) IMPLANT
RELOAD STAPLE 60 3.8 GOLD REG (STAPLE) IMPLANT
RELOAD STAPLE 60 4.1 GRN THCK (STAPLE) IMPLANT
RELOAD STAPLE 75 3.8 BLU REG (ENDOMECHANICALS) IMPLANT
RELOAD STAPLE 75 4.5 GRN THCK (ENDOMECHANICALS) IMPLANT
RELOAD STAPLER BLUE 60MM (STAPLE) IMPLANT
RELOAD STAPLER GOLD 60MM (STAPLE) IMPLANT
RELOAD STAPLER GREEN 60MM (STAPLE) ×8 IMPLANT
RELOAD STAPLER WHITE 60MM (STAPLE) ×1 IMPLANT
RETRACTOR WOUND ALXS 34CM XLRG (MISCELLANEOUS) ×1 IMPLANT
RTRCTR WOUND ALEXIS 34CM XLRG (MISCELLANEOUS) ×2
SCISSORS LAP 5X35 DISP (ENDOMECHANICALS) IMPLANT
SET IRRIG TUBING LAPAROSCOPIC (IRRIGATION / IRRIGATOR) IMPLANT
SET TUBE SMOKE EVAC HIGH FLOW (TUBING) ×2 IMPLANT
SHEARS FOC LG CVD HARMONIC 17C (MISCELLANEOUS) IMPLANT
SHEARS HARMONIC ACE PLUS 36CM (ENDOMECHANICALS) ×1 IMPLANT
SLEEVE SUCTION CATH 165 (SLEEVE) ×1 IMPLANT
SLEEVE Z-THREAD 5X100MM (TROCAR) ×3 IMPLANT
SPECIMEN JAR X LARGE (MISCELLANEOUS) ×2 IMPLANT
SPONGE INTESTINAL PEANUT (DISPOSABLE) IMPLANT
SPONGE SURGIFOAM ABS GEL 100 (HEMOSTASIS) IMPLANT
SPONGE T-LAP 18X18 ~~LOC~~+RFID (SPONGE) ×2 IMPLANT
STAPLE ECHEON FLEX 60 POW ENDO (STAPLE) ×2 IMPLANT
STAPLE LINE REINFORCEMENT LAP (STAPLE) ×1 IMPLANT
STAPLER PROXIMATE 75MM BLUE (STAPLE) IMPLANT
STAPLER RELOAD BLUE 60MM (STAPLE)
STAPLER RELOAD GOLD 60MM (STAPLE)
STAPLER RELOAD GREEN 60MM (STAPLE) ×16
STAPLER RELOAD WHITE 60MM (STAPLE) ×2
STAPLER VISISTAT 35W (STAPLE) ×1 IMPLANT
SUCTION POOLE HANDLE (INSTRUMENTS) ×2
SUT ETHILON 2 0 FS 18 (SUTURE) ×1 IMPLANT
SUT MNCRL AB 4-0 PS2 18 (SUTURE) ×2 IMPLANT
SUT PDS AB 1 TP1 54 (SUTURE) IMPLANT
SUT PDS AB 1 TP1 96 (SUTURE) ×2 IMPLANT
SUT PDS AB 3-0 SH 27 (SUTURE) IMPLANT
SUT PROLENE 2 0 SH 30 (SUTURE) ×1 IMPLANT
SUT PROLENE 3 0 SH 1 (SUTURE) ×1 IMPLANT
SUT PROLENE 3 0 SH 48 (SUTURE) ×1 IMPLANT
SUT PROLENE 4 0 RB 1 (SUTURE) ×12
SUT PROLENE 4-0 RB1 .5 CRCL 36 (SUTURE) ×1 IMPLANT
SUT SILK 0 TIES 10X30 (SUTURE) ×1 IMPLANT
SUT SILK 2 0 SH (SUTURE) ×4 IMPLANT
SUT SILK 2 0 TIES 10X30 (SUTURE) ×2 IMPLANT
SUT SILK 2 0SH CR/8 30 (SUTURE) ×2 IMPLANT
SUT SILK 3 0 TIES 10X30 (SUTURE) ×2 IMPLANT
SUT SILK 3 0SH CR/8 30 (SUTURE) ×3 IMPLANT
SUT VIC AB 2-0 CT1 27 (SUTURE)
SUT VIC AB 2-0 CT1 TAPERPNT 27 (SUTURE) ×1 IMPLANT
SUT VIC AB 2-0 SH 18 (SUTURE) IMPLANT
SUT VIC AB 3-0 MH 27 (SUTURE) IMPLANT
SUT VIC AB 3-0 SH 18 (SUTURE) ×3 IMPLANT
SUT VIC AB 3-0 SH 27 (SUTURE)
SUT VIC AB 3-0 SH 27X BRD (SUTURE) ×2 IMPLANT
SUT VIC AB 4-0 SH 18 (SUTURE) IMPLANT
SUT VICRYL 2 0 18  UND BR (SUTURE)
SUT VICRYL 2 0 18 UND BR (SUTURE) IMPLANT
SUT VICRYL 3 0 BR 18  UND (SUTURE)
SUT VICRYL 3 0 BR 18 UND (SUTURE) IMPLANT
SYR BULB IRRIG 60ML STRL (SYRINGE) ×2 IMPLANT
TIP INNERVISION DETACH 50FR (MISCELLANEOUS) ×1 IMPLANT
TOWEL GREEN STERILE (TOWEL DISPOSABLE) ×2 IMPLANT
TOWEL GREEN STERILE FF (TOWEL DISPOSABLE) ×2 IMPLANT
TRAY FOLEY MTR SLVR 14FR STAT (SET/KITS/TRAYS/PACK) ×1 IMPLANT
TRAY FOLEY MTR SLVR 16FR STAT (SET/KITS/TRAYS/PACK) ×2 IMPLANT
TRAY LAPAROSCOPIC MC (CUSTOM PROCEDURE TRAY) ×2 IMPLANT
TROCAR BALLN 12MMX100 BLUNT (TROCAR) ×1 IMPLANT
TROCAR XCEL BLUNT TIP 100MML (ENDOMECHANICALS) IMPLANT
TROCAR XCEL NON-BLD 5MMX100MML (ENDOMECHANICALS) ×1 IMPLANT
TUBE CONNECTING 12X1/4 (SUCTIONS) ×2 IMPLANT
WARMER LAPAROSCOPE (MISCELLANEOUS) ×2 IMPLANT
YANKAUER SUCT BULB TIP NO VENT (SUCTIONS) IMPLANT

## 2022-05-29 NOTE — Interval H&P Note (Signed)
History and Physical Interval Note:  05/29/2022 8:14 AM  Kevin [name]  has presented today for surgery, with the diagnosis of GASTRIC GIST.  The various methods of treatment have been discussed with the patient and family. After consideration of risks, benefits and other options for treatment, the patient has consented to  Procedure(s): OPEN PARTIAL GASTRECTOMY (N/A) POSSILE SPLENECTOMY (N/A) POSSIBLE DISTAL PANCREATECTOMY (N/A) LAPAROSCOPY DIAGNOSTIC (N/A) as a surgical intervention.  The patient's history has been reviewed, patient examined, no change in status, stable for surgery.  I have reviewed the patient's chart and labs.  Questions were answered to the patient's satisfaction.  Admit to inpatient postoperatively.   Dwan Bolt

## 2022-05-29 NOTE — Transfer of Care (Signed)
Immediate Anesthesia Transfer of Care Note  Patient: Kevin [name]  Procedure(s) Performed: OPEN PARTIAL GASTRECTOMY (Abdomen) SPLENECTOMY (Abdomen) DISTAL PANCREATECTOMY (Abdomen) LAPAROSCOPY DIAGNOSTIC (Abdomen)  Patient Location: PACU  Anesthesia Type:General  Level of Consciousness: drowsy and patient cooperative  Airway & Oxygen Therapy: Patient Spontanous Breathing  Post-op Assessment: Report given to RN and Post -op Vital signs reviewed and stable  Post vital signs: Reviewed and stable  Last Vitals:  Vitals Value Taken Time  BP 128/66 05/29/22 1308  Temp    Pulse 88 05/29/22 1311  Resp 21 05/29/22 1311  SpO2 95 % 05/29/22 1311  Vitals shown include unvalidated device data.  Last Pain:  Vitals:   05/29/22 0648  TempSrc:   PainSc: 0-No pain         Complications: No notable events documented.

## 2022-05-29 NOTE — Anesthesia Postprocedure Evaluation (Signed)
Anesthesia Post Note  Patient: Kevin [name]  Procedure(s) Performed: OPEN PARTIAL GASTRECTOMY (Abdomen) SPLENECTOMY (Abdomen) DISTAL PANCREATECTOMY (Abdomen) LAPAROSCOPY DIAGNOSTIC (Abdomen)     Patient location during evaluation: PACU Anesthesia Type: General Level of consciousness: awake and alert Pain management: pain level controlled Vital Signs Assessment: post-procedure vital signs reviewed and stable Respiratory status: spontaneous breathing, nonlabored ventilation, respiratory function stable and patient connected to nasal cannula oxygen Cardiovascular status: blood pressure returned to baseline and stable Postop Assessment: no apparent nausea or vomiting Anesthetic complications: no   No notable events documented.  Last Vitals:  Vitals:   05/29/22 1355 05/29/22 1450  BP: 125/69   Pulse: 90   Resp: 19 17  Temp:    SpO2: 94% 95%    Last Pain:  Vitals:   05/29/22 1450  TempSrc:   PainSc: Asleep                 Jeniah Kishi

## 2022-05-29 NOTE — Op Note (Signed)
Date: 05/29/22  Patient: Kevin [name] MRN: [id number]  Preoperative Diagnosis: Gastrointestinal stromal tumor Postoperative Diagnosis: Same  Procedure: Laparoscopic converted to open partial gastrectomy, distal pancreatectomy, splenectomy and resection of gastric mass  Surgeon: Sophronia Simas, MD Assistant: Manus Rudd, MD  EBL: 500 mL  Anesthesia: General endotracheal  Specimens: Partial gastrectomy, gastric mass, tail of pancreas, and spleen  Indications: Kevin [name] is a 71 yo male who was initially diagnosed with a GIST arising from the posterior stomach about 2 years ago.  He was treated with neoadjuvant Gleevec, but declined to have surgery and was lost to follow-up multiple times.  He re-presented earlier this year enlargement of the mass and was placed back on Gleevec, with subsequent decrease in size of the tumor.  He was then admitted last month with fevers and found to have new gas and necrosis within the tumor.  He is now agreeable to undergoing surgical resection after an extensive discussion of the risks and benefits of surgery.  Findings: Large mass arising from the posterior stomach and involving the spleen and the tail of the pancreas.  Procedure details: Informed consent was obtained in the preoperative area prior to the procedure. The patient was brought to the operating room and placed on the table in the supine position. General anesthesia was induced and appropriate lines and drains were placed for intraoperative monitoring. Perioperative antibiotics were administered per SCIP guidelines. The abdomen was prepped and draped in the usual sterile fashion. A pre-procedure timeout was taken verifying patient identity, surgical site and procedure to be performed.  A small supraumbilical skin incision was made, the umbilical stalk was grasped and elevated, and the fascia was incised vertically.  The peritoneum was bluntly opened and the peritoneal cavity was visualized, and a  12 mm Hassan trocar was placed.  Additional 5 mm ports were placed in the right upper quadrant and 2 ports in the left upper quadrant under direct visualization.  The abdomen was visually inspected and there was no obvious metastatic disease.  The gastrocolic omentum was opened using harmonic shears.  The known gastric mass was immediately visible in the lesser sac originating from the posterior fundus of the stomach.  It appeared fixed to the surface of the pancreas.  Attempts were made to create a plane between the mass and the anterior surface of the pancreas it was clear that this would not be possible.  As the mass was very fixed to surrounding structures and dissection was difficult, the decision was made to convert to laparotomy.  The ports were removed and the abdomen was desufflated.  An upper midline skin incision was made and the subcutaneous tissue was divided with cautery.  The fascia was opened along the linea alba.  The falciform ligament was taken down off the abdominal wall and ligated with the harmonic.  An Allexis wound protector and Bookwalter fixed retractor were placed.  The mass was palpated and examined.  It was clear that it was fixed to the spleen and originated from the posterior fundus of the stomach.  The anterior wall of the stomach was completely free and mobile.  The GE junction was also clearly free of tumor.  We began dissecting around the inferior surface of the mass.  This was adherent to the transverse mesocolon but did not appear to involve the colon itself.  The mass was carefully dissected off of the transverse mesocolon, using the harmonic to achieve hemostasis.  Larger vessels were tied with 2-0 silk ties.  The splenic flexure of the colon was taken down using cautery.  Once the mass was completely separate from the colon and transverse mesocolon, the spleen was mobilized.  The retroperitoneal attachments were taken down using blunt dissection and cautery.  The posterior  tail of the pancreas was also mobilized off the retroperitoneum using gentle blunt dissection and cautery. This allowed further mobilization of the mass en bloc with the spleen and tail of the pancreas. It was clear that the mass was invading the tail of the pancreas with no plane to separate the two structures. Thus the decision was made to proceed with distal pancreatectomy. The splenic artery was palpated along the superior border of the body of the pancreas, just medial to the mass. It was circumferentially dissected out, then doubly ligated with 2-0 silk ties and a 4-0 prolene suture ligature. Prior to division, the splenic artery was occluded and a pulse was palpated in the porta hepatis. After the artery was divided, the splenic vein was visible. A plane was created bluntly between the vein and the pancreas. The pancreas was then transected using a 60mm Echelon stapler with a green load, reinforced with peristrips. The splenic vein was then doubly ligated with 2-0 silk ties and a 4-0 prolene suture ligature. At this point the mass was adherent only to the posterior stomach and a portion of the retroperitoneum. There retroperitoneal attachments were divided with cautery. The stomach was carefully examined, and the mass clearly involved the posterior wall at the fundus. The lesser curve and GE junction were clearly not involved. The decision was made to resect the fundus of the stomach. A 44-Fr bougie was passed into the stomach by anesthesia to ensure that the lumen was not narrowed during the resection. The fundus was then resected using multiple fires of an Echelon 60mm stapler with green loads. At the completion of the resection, the lumen of the proximal stomach was still significantly larger than the bougie. The specimen was then passsed off the field, en bloc with the spleen, tail of pancreas, and gastric resection, and sent for routine pathology.   The abdomen was irrigated with warmed saline. There  was some oozing on the retroperitoneum which was controlled with cautery.  The porta hepatis was palpated and there was a palpable pulse with a thrill.  The splenic flexure of the colon was closely examined.  It was well perfused with no defects in the transverse mesocolon.  There was a serosal tear approximately 3 cm long, which was oversewn using 3-0 silk Lembert sutures.  The stomach was closely examined.  There was a serosal tear on the posterior surface which was oversewn with 3-0 silk Lembert sutures.  The remaining stomach was pink and well-perfused with no signs of ischemia.  The bougie was removed and an NG tube was placed, and the tip was palpated in appropriate position within the stomach.  A 19-Fr JP drain was placed adjacent to the remnant pancreas and through the splenic fossa and brought out through one of the left upper quadrant port sites.  It was secured to the skin with a 2-0 nylon suture. A falciform ligament flap was mobilized and placed adjacent to the remnant pancreas. Arista was placed on the retroperitoneum. The wound protector and retractors were removed.    The fascia was closed at midline with a running looped 1 PDS suture.  Scarpa's layer was closed with a running 3-0 Vicryl suture, and the skin was closed with a running subcuticular 4-0  Monocryl suture.  The skin at all port sites was closed with a subcuticular 4-0 Monocryl suture.  Dermabond was applied.  The patient tolerated the procedure well with no apparent complications.  All counts were correct x2 at the end of the procedure. The patient was extubated and taken to PACU in stable condition.  Sophronia Simas, MD 05/29/22 1:40 PM

## 2022-05-30 ENCOUNTER — Encounter (HOSPITAL_COMMUNITY): Payer: Self-pay | Admitting: Surgery

## 2022-05-30 LAB — GLUCOSE, CAPILLARY
Glucose-Capillary: 136 mg/dL — ABNORMAL HIGH (ref 70–99)
Glucose-Capillary: 141 mg/dL — ABNORMAL HIGH (ref 70–99)
Glucose-Capillary: 149 mg/dL — ABNORMAL HIGH (ref 70–99)
Glucose-Capillary: 159 mg/dL — ABNORMAL HIGH (ref 70–99)
Glucose-Capillary: 169 mg/dL — ABNORMAL HIGH (ref 70–99)
Glucose-Capillary: 181 mg/dL — ABNORMAL HIGH (ref 70–99)

## 2022-05-30 LAB — BASIC METABOLIC PANEL
Anion gap: 7 (ref 5–15)
BUN: 8 mg/dL (ref 8–23)
CO2: 22 mmol/L (ref 22–32)
Calcium: 7.3 mg/dL — ABNORMAL LOW (ref 8.9–10.3)
Chloride: 105 mmol/L (ref 98–111)
Creatinine, Ser: 0.78 mg/dL (ref 0.61–1.24)
GFR, Estimated: >60 mL/min (ref >60)
Glucose, Bld: 185 mg/dL — ABNORMAL HIGH (ref 70–99)
Potassium: 4 mmol/L (ref 3.5–5.1)
Sodium: 134 mmol/L — ABNORMAL LOW (ref 135–145)

## 2022-05-30 LAB — CBC
HCT: 27.9 % — ABNORMAL LOW (ref 39.0–52.0)
Hemoglobin: 8.9 g/dL — ABNORMAL LOW (ref 13.0–17.0)
MCH: 27.1 pg (ref 26.0–34.0)
MCHC: 31.9 g/dL (ref 30.0–36.0)
MCV: 85.1 fL (ref 80.0–100.0)
Platelets: 346 10*3/uL (ref 150–400)
RBC: 3.28 MIL/uL — ABNORMAL LOW (ref 4.22–5.81)
RDW: 16.8 % — ABNORMAL HIGH (ref 11.5–15.5)
WBC: 14.8 10*3/uL — ABNORMAL HIGH (ref 4.0–10.5)
nRBC: 0 % (ref 0.0–0.2)

## 2022-05-30 MED ORDER — ACETAMINOPHEN 10 MG/ML IV SOLN
1000.0000 mg | Freq: Three times a day (TID) | INTRAVENOUS | Status: AC
  Administered 2022-05-30 – 2022-05-31 (×3): 1000 mg via INTRAVENOUS
  Filled 2022-05-30 (×3): qty 100

## 2022-05-30 MED ORDER — DEXTROSE 5 % IV SOLN
500.0000 mg | Freq: Three times a day (TID) | INTRAVENOUS | Status: DC
Start: 2022-05-30 — End: 2022-06-03
  Administered 2022-05-30 – 2022-06-03 (×13): 500 mg via INTRAVENOUS
  Filled 2022-05-30: qty 5
  Filled 2022-05-30 (×2): qty 500
  Filled 2022-05-30: qty 5
  Filled 2022-05-30: qty 500
  Filled 2022-05-30 (×4): qty 5
  Filled 2022-05-30 (×3): qty 500
  Filled 2022-05-30 (×2): qty 5
  Filled 2022-05-30: qty 500

## 2022-05-30 MED ORDER — ACETAMINOPHEN 10 MG/ML IV SOLN: 1000.0000 mg | Freq: Three times a day (TID) | INTRAVENOUS | Status: DC

## 2022-05-30 MED ORDER — DEXTROSE-NACL 5-0.45 % IV SOLN: INTRAVENOUS | Status: DC

## 2022-05-30 NOTE — Evaluation (Signed)
Physical Therapy Evaluation Patient Details Name: Kevin [name] MRN: [id number] DOB: Apr 05, 1951 Today's Date: 05/30/2022  History of Present Illness  Pt adm 6/21 with gastrointestinal stromal tumor of body of stomach. Pt underwent partial gastrectomy, distal pancreatectomy, and splenectomy on 6/21. PMH - HTN.  Clinical Impression  Pt presented to PT with good mobility and only needed assistance for lines/tubes. No skilled PT needs. Pt can continue to amb with staff to manage his lines and once lines removed he will be independent with mobility.        Recommendations for follow up therapy are one component of a multi-disciplinary discharge planning process, led by the attending physician.  Recommendations may be updated based on patient status, additional functional criteria and insurance authorization.  Follow Up Recommendations No PT follow up      Assistance Recommended at Discharge None  Patient can return home with the following       Equipment Recommendations None recommended by PT  Recommendations for Other Services       Functional Status Assessment Patient has not had a recent decline in their functional status     Precautions / Restrictions Precautions Precautions: Other (comment) Precaution Comments: NG tube Restrictions Weight Bearing Restrictions: No      Mobility  Bed Mobility Overal bed mobility: Needs Assistance Bed Mobility: Sidelying to Sit   Sidelying to sit: Min guard       General bed mobility comments: Verbal cues for technique    Transfers Overall transfer level: Needs assistance Equipment used: Rolling walker (2 wheels), None Transfers: Sit to/from Stand Sit to Stand: Supervision           General transfer comment: supervision for lines only    Ambulation/Gait Ambulation/Gait assistance: Supervision Gait Distance (Feet): 450 Feet Assistive device: Rolling walker (2 wheels), None Gait Pattern/deviations: WFL(Within Functional  Limits) Gait velocity: decr Gait velocity interpretation: 1.31 - 2.62 ft/sec, indicative of limited community ambulator   General Gait Details: Steady gait with and without walker. Supervision needed only for lines/tubes  Stairs            Wheelchair Mobility    Modified Rankin (Stroke Patients Only)       Balance Overall balance assessment: No apparent balance deficits (not formally assessed)                                           Pertinent Vitals/Pain Pain Assessment Pain Assessment: 0-10 Pain Score: 2  Pain Location: abdomen Pain Descriptors / Indicators: Sore Pain Intervention(s): Limited activity within patient's tolerance    Home Living Family/patient expects to be discharged to:: Private residence Living Arrangements: Spouse/significant other;Children;Other relatives Available Help at Discharge: Family Type of Home: House Home Access: Stairs to enter   Entrance Stairs-Number of Steps: 1   Home Layout: Two level;Able to live on main level with bedroom/bathroom Home Equipment: None      Prior Function Prior Level of Function : Independent/Modified Independent             Mobility Comments: No assistive device       Hand Dominance   Dominant Hand: Right    Extremity/Trunk Assessment   Upper Extremity Assessment Upper Extremity Assessment: Overall WFL for tasks assessed    Lower Extremity Assessment Lower Extremity Assessment: Overall WFL for tasks assessed       Communication   Communication: Prefers language  other than [place];Interpreter utilized Jimmie Molly [phone])  Cognition Arousal/Alertness: Awake/alert Behavior During Therapy: WFL for tasks assessed/performed Overall Cognitive Status: Within Functional Limits for tasks assessed                                          General Comments      Exercises     Assessment/Plan    PT Assessment Patient does not need any further PT services   PT Problem List         PT Treatment Interventions      PT Goals (Current goals can be found in the Care Plan section)  Acute Rehab PT Goals PT Goal Formulation: All assessment and education complete, DC therapy    Frequency       Co-evaluation               AM-PAC PT "6 Clicks" Mobility  Outcome Measure Help needed turning from your back to your side while in a flat bed without using bedrails?: None Help needed moving from lying on your back to sitting on the side of a flat bed without using bedrails?: A Little Help needed moving to and from a bed to a chair (including a wheelchair)?: A Little Help needed standing up from a chair using your arms (e.g., wheelchair or bedside chair)?: None Help needed to walk in hospital room?: A Little Help needed climbing 3-5 steps with a railing? : A Little 6 Click Score: 20    End of Session   Activity Tolerance: Patient tolerated treatment well Patient left: in chair;with call bell/phone within reach;with chair alarm set Nurse Communication: Mobility status PT Visit Diagnosis: Other abnormalities of gait and mobility (R26.89)    Time: 3474-2595 PT Time Calculation (min) (ACUTE ONLY): 21 min   Charges:   PT Evaluation $PT Eval Low Complexity: [place] [facility] [place] 05/30/2022, 1:02 PM

## 2022-05-30 NOTE — Progress Notes (Signed)
1 Day Post-Op  Subjective: No acute issues overnight. Pain controlled, denies nausea/vomiting. Hgb stable.   Objective: Vital signs in last 24 hours: Temp:  [98 F (36.7 C)-98.6 F (37 C)] 98.3 F (36.8 C) (06/22 0436) Pulse Rate:  [58-98] 58 (06/22 0436) Resp:  [16-19] 17 (06/22 0421) BP: (120-140)/(66-100) 135/72 (06/22 0436) SpO2:  [93 %-100 %] 100 % (06/22 0436)    Intake/Output from previous day: 06/21 0701 - 06/22 0700 In: 2350.4 [I.V.:1620.7; Blood:315; IV Piggyback:414.7] Out: 2385 [Urine:1650; Emesis/NG output:100; Drains:135; Blood:500] Intake/Output this shift: No intake/output data recorded.  PE: General: resting comfortably, NAD Neuro: alert and oriented, no focal deficits HEENT: NG in place, draining gastric contents Resp: normal work of breathing Abdomen: soft, nondistended, nontender to palpation. Midline incision clean and dry. LUQ JP with serosanguinous drainage. Extremities: warm and well-perfused GU: foley in place draining clear yellow urine   Lab Results:  Recent Labs    05/29/22 1356 05/30/22 0032  WBC 9.4 14.8*  HGB 9.4* 8.9*  HCT 27.8* 27.9*  PLT 354 346   BMET Recent Labs    05/29/22 1356 05/30/22 0032  NA 136 134*  K 3.1* 4.0  CL 107 105  CO2 21* 22  GLUCOSE 235* 185*  BUN 9 8  CREATININE 0.99 0.78  CALCIUM 7.4* 7.3*   PT/INR No results for input(s): "LABPROT", "INR" in the last 72 hours. CMP     Component Value Date/Time   NA 134 (L) 05/30/2022 0032   K 4.0 05/30/2022 0032   CL 105 05/30/2022 0032   CO2 22 05/30/2022 0032   GLUCOSE 185 (H) 05/30/2022 0032   BUN 8 05/30/2022 0032   CREATININE 0.78 05/30/2022 0032   CREATININE 0.94 05/09/2022 1029   CALCIUM 7.3 (L) 05/30/2022 0032   PROT 7.2 05/09/2022 1029   ALBUMIN 3.2 (L) 05/09/2022 1029   AST 12 (L) 05/09/2022 1029   ALT 9 05/09/2022 1029   ALKPHOS 61 05/09/2022 1029   BILITOT 0.5 05/09/2022 1029   GFRNONAA >60 05/30/2022 0032   GFRNONAA >60 05/09/2022  1029   GFRAA >60 09/01/2020 1206   Lipase     Component Value Date/Time   LIPASE 23 04/15/2022 0910       Studies/Results: No results found.  Anti-infectives: Anti-infectives (From admission, onward)    Start     Dose/Rate Route Frequency Ordered Stop   05/29/22 0700  cefTRIAXone (ROCEPHIN) 2 g in sodium chloride 0.9 % 100 mL IVPB       See Hyperspace for full Linked Orders Report.   2 g 200 mL/hr over 30 Minutes Intravenous On call to O.R. 05/29/22 0654 05/29/22 0852   05/29/22 0700  metroNIDAZOLE (FLAGYL) IVPB 500 mg       See Hyperspace for full Linked Orders Report.   500 mg 100 mL/hr over 60 Minutes Intravenous On call to O.R. 05/29/22 4403 05/29/22 0852        Assessment/Plan 71 yo male with large gastric GIST involving pancreas and spleen, POD1 s/p partial gastrectomy, distal pancreatectomy and splenectomy. - Keep NG tube today, NPO except ice chips, maintenance IV fluids - Remove foley - Drain amylase today and POD3 - Splenectomy vaccines to be given prior to discharge - Pain control: dilaudid PCA, scheduled robaxin and IV tylenol - Mobilize, PT ordered - VTE: lovenox, SCDs - Dispo: inpatient, med-surg floor     LOS: 1 day    Michaelle Birks, MD [facility] Surgery General, Hepatobiliary and Pancreatic Surgery 05/30/22 7:52  AM

## 2022-05-31 LAB — BASIC METABOLIC PANEL
Anion gap: 6 (ref 5–15)
BUN: 7 mg/dL — ABNORMAL LOW (ref 8–23)
CO2: 25 mmol/L (ref 22–32)
Calcium: 7.7 mg/dL — ABNORMAL LOW (ref 8.9–10.3)
Chloride: 107 mmol/L (ref 98–111)
Creatinine, Ser: 0.65 mg/dL (ref 0.61–1.24)
GFR, Estimated: >60 mL/min (ref >60)
Glucose, Bld: 137 mg/dL — ABNORMAL HIGH (ref 70–99)
Potassium: 3.4 mmol/L — ABNORMAL LOW (ref 3.5–5.1)
Sodium: 138 mmol/L (ref 135–145)

## 2022-05-31 LAB — GLUCOSE, CAPILLARY
Glucose-Capillary: 109 mg/dL — ABNORMAL HIGH (ref 70–99)
Glucose-Capillary: 113 mg/dL — ABNORMAL HIGH (ref 70–99)
Glucose-Capillary: 117 mg/dL — ABNORMAL HIGH (ref 70–99)
Glucose-Capillary: 129 mg/dL — ABNORMAL HIGH (ref 70–99)
Glucose-Capillary: 149 mg/dL — ABNORMAL HIGH (ref 70–99)
Glucose-Capillary: 154 mg/dL — ABNORMAL HIGH (ref 70–99)

## 2022-05-31 LAB — CBC
HCT: 25.8 % — ABNORMAL LOW (ref 39.0–52.0)
Hemoglobin: 7.9 g/dL — ABNORMAL LOW (ref 13.0–17.0)
MCH: 26.9 pg (ref 26.0–34.0)
MCHC: 30.6 g/dL (ref 30.0–36.0)
MCV: 87.8 fL (ref 80.0–100.0)
Platelets: 378 10*3/uL (ref 150–400)
RBC: 2.94 MIL/uL — ABNORMAL LOW (ref 4.22–5.81)
RDW: 17.2 % — ABNORMAL HIGH (ref 11.5–15.5)
WBC: 18.2 10*3/uL — ABNORMAL HIGH (ref 4.0–10.5)
nRBC: 0.1 % (ref 0.0–0.2)

## 2022-05-31 MED ORDER — ACETAMINOPHEN 500 MG PO TABS
1000.0000 mg | ORAL_TABLET | Freq: Three times a day (TID) | ORAL | Status: DC
Start: 2022-05-31 — End: 2022-06-05
  Administered 2022-05-31 – 2022-06-05 (×11): 1000 mg via ORAL
  Filled 2022-05-31 (×14): qty 2

## 2022-05-31 MED ORDER — OXYCODONE HCL 5 MG PO TABS
5.0000 mg | ORAL_TABLET | ORAL | Status: DC | PRN
  Administered 2022-06-01: 5 mg via ORAL
  Administered 2022-06-05: 10 mg via ORAL
  Filled 2022-05-31: qty 2
  Filled 2022-05-31: qty 1

## 2022-05-31 MED ORDER — HYDROMORPHONE HCL 1 MG/ML IJ SOLN: 0.5000 mg | INTRAMUSCULAR | Status: DC | PRN

## 2022-05-31 MED ORDER — AMLODIPINE BESYLATE 10 MG PO TABS
10.0000 mg | ORAL_TABLET | Freq: Every day | ORAL | Status: DC
  Administered 2022-05-31 – 2022-06-05 (×6): 10 mg via ORAL
  Filled 2022-05-31 (×6): qty 1

## 2022-05-31 MED ORDER — POTASSIUM CHLORIDE 10 MEQ/100ML IV SOLN
10.0000 meq | INTRAVENOUS | Status: AC
  Administered 2022-05-31 (×3): 10 meq via INTRAVENOUS
  Filled 2022-05-31 (×2): qty 100

## 2022-06-01 LAB — GLUCOSE, CAPILLARY
Glucose-Capillary: 100 mg/dL — ABNORMAL HIGH (ref 70–99)
Glucose-Capillary: 107 mg/dL — ABNORMAL HIGH (ref 70–99)
Glucose-Capillary: 121 mg/dL — ABNORMAL HIGH (ref 70–99)
Glucose-Capillary: 121 mg/dL — ABNORMAL HIGH (ref 70–99)
Glucose-Capillary: 131 mg/dL — ABNORMAL HIGH (ref 70–99)
Glucose-Capillary: 133 mg/dL — ABNORMAL HIGH (ref 70–99)
Glucose-Capillary: 142 mg/dL — ABNORMAL HIGH (ref 70–99)

## 2022-06-01 LAB — BASIC METABOLIC PANEL
Anion gap: 9 (ref 5–15)
BUN: 5 mg/dL — ABNORMAL LOW (ref 8–23)
CO2: 25 mmol/L (ref 22–32)
Calcium: 7.8 mg/dL — ABNORMAL LOW (ref 8.9–10.3)
Chloride: 104 mmol/L (ref 98–111)
Creatinine, Ser: 0.7 mg/dL (ref 0.61–1.24)
GFR, Estimated: >60 mL/min (ref >60)
Glucose, Bld: 127 mg/dL — ABNORMAL HIGH (ref 70–99)
Potassium: 3.2 mmol/L — ABNORMAL LOW (ref 3.5–5.1)
Sodium: 138 mmol/L (ref 135–145)

## 2022-06-01 LAB — CBC
HCT: 27.3 % — ABNORMAL LOW (ref 39.0–52.0)
Hemoglobin: 8.7 g/dL — ABNORMAL LOW (ref 13.0–17.0)
MCH: 27.4 pg (ref 26.0–34.0)
MCHC: 31.9 g/dL (ref 30.0–36.0)
MCV: 85.8 fL (ref 80.0–100.0)
Platelets: 457 10*3/uL — ABNORMAL HIGH (ref 150–400)
RBC: 3.18 MIL/uL — ABNORMAL LOW (ref 4.22–5.81)
RDW: 16.8 % — ABNORMAL HIGH (ref 11.5–15.5)
WBC: 15.3 10*3/uL — ABNORMAL HIGH (ref 4.0–10.5)
nRBC: 0.8 % — ABNORMAL HIGH (ref 0.0–0.2)

## 2022-06-01 MED ORDER — DOCUSATE SODIUM 100 MG PO CAPS
100.0000 mg | ORAL_CAPSULE | Freq: Two times a day (BID) | ORAL | Status: DC
  Administered 2022-06-01 – 2022-06-05 (×9): 100 mg via ORAL
  Filled 2022-06-01 (×9): qty 1

## 2022-06-01 MED ORDER — POTASSIUM CHLORIDE CRYS ER 20 MEQ PO TBCR
40.0000 meq | EXTENDED_RELEASE_TABLET | Freq: Once | ORAL | Status: AC
Start: 2022-06-01 — End: 2022-06-01
  Administered 2022-06-01: 40 meq via ORAL
  Filled 2022-06-01: qty 2

## 2022-06-01 NOTE — Progress Notes (Signed)
    3 Days Post-Op  Subjective: No acute issues. Vitals stable. WBC downtrending. Patient denies nausea/vomiting. Tolerating clears.  Objective: Vital signs in last 24 hours: Temp:  [97.5 F (36.4 C)-98.7 F (37.1 C)] 98.3 F (36.8 C) (06/24 0424) Pulse Rate:  [84-91] 84 (06/24 0424) Resp:  [16-17] 16 (06/24 0424) BP: (124-157)/(73-84) 138/77 (06/24 0424) SpO2:  [95 %-98 %] 96 % (06/24 0424) Last BM Date :  (preop)  Intake/Output from previous day: 06/23 0701 - 06/24 0700 In: 1232.4 [P.O.:240; I.V.:500; IV Piggyback:492.4] Out: 2310 [Urine:2250; Drains:60] Intake/Output this shift: No intake/output data recorded.  PE: General: resting comfortably, NAD Neuro: alert and oriented, no focal deficits Resp: normal work of breathing Abdomen: soft, nondistended, nontender to palpation. Incisions clean and dry. LUQ JP with serosanguinous drainage. Extremities: warm and well-perfused   Lab Results:  Recent Labs    05/31/22 0038 06/01/22 0034  WBC 18.2* 15.3*  HGB 7.9* 8.7*  HCT 25.8* 27.3*  PLT 378 457*   BMET Recent Labs    05/31/22 0038 06/01/22 0034  NA 138 138  K 3.4* 3.2*  CL 107 104  CO2 25 25  GLUCOSE 137* 127*  BUN 7* 5*  CREATININE 0.65 0.70  CALCIUM 7.7* 7.8*   PT/INR No results for input(s): "LABPROT", "INR" in the last 72 hours. CMP     Component Value Date/Time   NA 138 06/01/2022 0034   K 3.2 (L) 06/01/2022 0034   CL 104 06/01/2022 0034   CO2 25 06/01/2022 0034   GLUCOSE 127 (H) 06/01/2022 0034   BUN 5 (L) 06/01/2022 0034   CREATININE 0.70 06/01/2022 0034   CREATININE 0.94 05/09/2022 1029   CALCIUM 7.8 (L) 06/01/2022 0034   PROT 7.2 05/09/2022 1029   ALBUMIN 3.2 (L) 05/09/2022 1029   AST 12 (L) 05/09/2022 1029   ALT 9 05/09/2022 1029   ALKPHOS 61 05/09/2022 1029   BILITOT 0.5 05/09/2022 1029   GFRNONAA >60 06/01/2022 0034   GFRNONAA >60 05/09/2022 1029   GFRAA >60 09/01/2020 1206   Lipase     Component Value Date/Time   LIPASE 23  04/15/2022 0910       Studies/Results: No results found.     Assessment/Plan 71 yo male with large gastric GIST involving pancreas and spleen, POD3 s/p partial gastrectomy, distal pancreatectomy and splenectomy. - Advance to full liquid diet, SLIV - POD1 drain amylase pending, will send another amylase today - Splenectomy vaccines to be given prior to discharge - Leukocytosis: likely secondary to asplenia, improving - Pain control: scheduled tylenol and robaxin, prn oxycodone and dilaudid - Hypokalemia: replete - Ambulate TID - VTE: lovenox, SCDs     LOS: 3 days    Sophronia Simas, MD [facility] Surgery General, Hepatobiliary and Pancreatic Surgery 06/01/22 8:04 AM

## 2022-06-02 LAB — TYPE AND SCREEN
ABO/RH(D): O POS
Antibody Screen: NEGATIVE
Unit division: 0
Unit division: 0

## 2022-06-02 LAB — BPAM RBC
Blood Product Expiration Date: 202307182359
Blood Product Expiration Date: 202307182359
ISSUE DATE / TIME: 202306211040
ISSUE DATE / TIME: 202306211040
Unit Type and Rh: 5100
Unit Type and Rh: 5100

## 2022-06-02 LAB — GLUCOSE, CAPILLARY
Glucose-Capillary: 110 mg/dL — ABNORMAL HIGH (ref 70–99)
Glucose-Capillary: 113 mg/dL — ABNORMAL HIGH (ref 70–99)
Glucose-Capillary: 119 mg/dL — ABNORMAL HIGH (ref 70–99)
Glucose-Capillary: 150 mg/dL — ABNORMAL HIGH (ref 70–99)
Glucose-Capillary: 113 mg/dL — ABNORMAL HIGH (ref 70–99)
Glucose-Capillary: 128 mg/dL — ABNORMAL HIGH (ref 70–99)
Glucose-Capillary: 169 mg/dL — ABNORMAL HIGH (ref 70–99)

## 2022-06-02 LAB — AMYLASE, BODY FLUID (OTHER): Amylase, Body Fluid: 423 U/L

## 2022-06-03 LAB — GLUCOSE, CAPILLARY
Glucose-Capillary: 110 mg/dL — ABNORMAL HIGH (ref 70–99)
Glucose-Capillary: 123 mg/dL — ABNORMAL HIGH (ref 70–99)
Glucose-Capillary: 123 mg/dL — ABNORMAL HIGH (ref 70–99)
Glucose-Capillary: 127 mg/dL — ABNORMAL HIGH (ref 70–99)
Glucose-Capillary: 178 mg/dL — ABNORMAL HIGH (ref 70–99)
Glucose-Capillary: 99 mg/dL (ref 70–99)

## 2022-06-03 LAB — AMYLASE, BODY FLUID (OTHER): Amylase, Body Fluid: 148 U/L

## 2022-06-03 LAB — SURGICAL PATHOLOGY

## 2022-06-03 MED ORDER — METHOCARBAMOL 500 MG PO TABS
500.0000 mg | ORAL_TABLET | Freq: Four times a day (QID) | ORAL | Status: DC
  Administered 2022-06-03 – 2022-06-05 (×7): 500 mg via ORAL
  Filled 2022-06-03 (×8): qty 1

## 2022-06-03 MED ORDER — INSULIN ASPART 100 UNIT/ML IJ SOLN
0.0000 [IU] | Freq: Three times a day (TID) | INTRAMUSCULAR | Status: DC
  Administered 2022-06-03 – 2022-06-04 (×3): 1 [IU] via SUBCUTANEOUS
  Administered 2022-06-05: 2 [IU] via SUBCUTANEOUS
  Administered 2022-06-05: 1 [IU] via SUBCUTANEOUS

## 2022-06-04 LAB — GLUCOSE, CAPILLARY
Glucose-Capillary: 122 mg/dL — ABNORMAL HIGH (ref 70–99)
Glucose-Capillary: 119 mg/dL — ABNORMAL HIGH (ref 70–99)
Glucose-Capillary: 147 mg/dL — ABNORMAL HIGH (ref 70–99)
Glucose-Capillary: 130 mg/dL — ABNORMAL HIGH (ref 70–99)

## 2022-06-04 MED ORDER — LISINOPRIL 10 MG PO TABS
10.0000 mg | ORAL_TABLET | Freq: Every day | ORAL | Status: DC
  Administered 2022-06-04 – 2022-06-05 (×2): 10 mg via ORAL
  Filled 2022-06-04 (×2): qty 1

## 2022-06-04 NOTE — Discharge Instructions (Addendum)
PER MEDICAL ONCOLOGY: Follow up with Dr. Benay Spice on 06/28/22 at 10:30 am for lab/office visit as scheduled.  [facility] [place] [facility] DISCHARGE INSTRUCTIONS  Activity No heavy lifting greater than 10 pounds for 6 weeks after surgery. Ok to shower, but do not bathe or submerge incision underwater. Do not drive while taking narcotic pain medication. Be sure to walk around your home at least 3 times daily. This will help avoid blood clots and will improve your strength.  Wound Care Your incision is covered with skin glue called Dermabond. This will peel off on its own over time. You may shower and allow warm soapy water to run over your incisions. Gently pat dry. Do not submerge your incision underwater. Monitor your incision for any new redness, tenderness, or drainage.  Medications You may take Tylenol up to 4 times daily for pain. Do NOT take more than 4000 mg Tylenol in a single 24-hour period. If you have more severe pain that is not covered by Tylenol, you may take oxycodone up to every 6 hours as needed. Please reserve this medication only for the most severe pain.  Diet You may notice that you feel full early after meals, or have occasional nausea or decreased appetite. This is normal following surgery on your stomach and will improve with time. If you have these symptoms, it is better to consume small frequent meals throughout the day rather than 3 large meals. You should consume soft foods that are easy to digest. Avoid drinking a lot of carbonated beverages, as these can worsen symptoms of nausea and reflux. If you are having frequent vomiting after meals or are unable to keep down any food or drink, please call the office right away.  When to Call us: Fever greater than 100.5 New redness, drainage, or swelling at incision site Severe pain, nausea, or vomiting Excessive vomiting or inability to keep down any liquids Jaundice (yellowing of the whites of the eyes or  skin)  Follow-up You have an appointment scheduled with Dr. Zenia Resides on June 25, 2022 at 9:40am. This will be at the [facility] office at [address], [place], [place], [place]. Please arrive at least 15 minutes prior to your scheduled appointment time.  For questions or concerns, please call the office at [phone].

## 2022-06-05 ENCOUNTER — Encounter (HOSPITAL_COMMUNITY): Payer: Self-pay

## 2022-06-05 ENCOUNTER — Other Ambulatory Visit (HOSPITAL_COMMUNITY): Payer: Self-pay

## 2022-06-05 ENCOUNTER — Observation Stay (HOSPITAL_COMMUNITY)
Admission: EM | Admit: 2022-06-05 | Discharge: 2022-06-07 | Disposition: A | Discharge status: Home / Self Care | Payer: Medicare Other | Attending: Surgery | Admitting: Surgery

## 2022-06-05 ENCOUNTER — Emergency Department (HOSPITAL_COMMUNITY): Payer: Medicare Other

## 2022-06-05 ENCOUNTER — Other Ambulatory Visit: Payer: Self-pay

## 2022-06-05 DIAGNOSIS — R2689 Other abnormalities of gait and mobility: Secondary | ICD-10-CM | POA: Insufficient documentation

## 2022-06-05 DIAGNOSIS — C49A2 Gastrointestinal stromal tumor of stomach: Secondary | ICD-10-CM | POA: Insufficient documentation

## 2022-06-05 DIAGNOSIS — A419 Sepsis, unspecified organism: Secondary | ICD-10-CM | POA: Diagnosis not present

## 2022-06-05 DIAGNOSIS — I10 Essential (primary) hypertension: Secondary | ICD-10-CM | POA: Insufficient documentation

## 2022-06-05 DIAGNOSIS — Z87891 Personal history of nicotine dependence: Secondary | ICD-10-CM | POA: Insufficient documentation

## 2022-06-05 DIAGNOSIS — R509 Fever, unspecified: Secondary | ICD-10-CM

## 2022-06-05 DIAGNOSIS — Z20822 Contact with and (suspected) exposure to covid-19: Secondary | ICD-10-CM | POA: Insufficient documentation

## 2022-06-05 DIAGNOSIS — R531 Weakness: Secondary | ICD-10-CM | POA: Insufficient documentation

## 2022-06-05 DIAGNOSIS — Z79899 Other long term (current) drug therapy: Secondary | ICD-10-CM | POA: Insufficient documentation

## 2022-06-05 DIAGNOSIS — D649 Anemia, unspecified: Secondary | ICD-10-CM | POA: Diagnosis present

## 2022-06-05 LAB — COMPREHENSIVE METABOLIC PANEL
ALT: 16 U/L (ref 0–44)
AST: 20 U/L (ref 15–41)
Albumin: 2.3 g/dL — ABNORMAL LOW (ref 3.5–5.0)
Alkaline Phosphatase: 77 U/L (ref 38–126)
Anion gap: 11 (ref 5–15)
BUN: 18 mg/dL (ref 8–23)
CO2: 20 mmol/L — ABNORMAL LOW (ref 22–32)
Calcium: 8.1 mg/dL — ABNORMAL LOW (ref 8.9–10.3)
Chloride: 106 mmol/L (ref 98–111)
Creatinine, Ser: 0.93 mg/dL (ref 0.61–1.24)
GFR, Estimated: >60 mL/min (ref >60)
Glucose, Bld: 169 mg/dL — ABNORMAL HIGH (ref 70–99)
Potassium: 3.6 mmol/L (ref 3.5–5.1)
Sodium: 137 mmol/L (ref 135–145)
Total Bilirubin: 0.6 mg/dL (ref 0.3–1.2)
Total Protein: 6.7 g/dL (ref 6.5–8.1)

## 2022-06-05 LAB — LACTIC ACID, PLASMA: Lactic Acid, Venous: 1.3 mmol/L (ref 0.5–1.9)

## 2022-06-05 LAB — CBC WITH DIFFERENTIAL/PLATELET
Abs Immature Granulocytes: 0.13 10*3/uL — ABNORMAL HIGH (ref 0.00–0.07)
Basophils Absolute: 0 10*3/uL (ref 0.0–0.1)
Basophils Relative: 0 %
Eosinophils Absolute: 0.1 10*3/uL (ref 0.0–0.5)
Eosinophils Relative: 1 %
HCT: 25.8 % — ABNORMAL LOW (ref 39.0–52.0)
Hemoglobin: 8.3 g/dL — ABNORMAL LOW (ref 13.0–17.0)
Immature Granulocytes: 1 %
Lymphocytes Relative: 7 %
Lymphs Abs: 1.1 10*3/uL (ref 0.7–4.0)
MCH: 28 pg (ref 26.0–34.0)
MCHC: 32.2 g/dL (ref 30.0–36.0)
MCV: 87.2 fL (ref 80.0–100.0)
Monocytes Absolute: 1.7 10*3/uL — ABNORMAL HIGH (ref 0.1–1.0)
Monocytes Relative: 11 %
Neutro Abs: 13.2 10*3/uL — ABNORMAL HIGH (ref 1.7–7.7)
Neutrophils Relative %: 80 %
Platelets: 812 10*3/uL — ABNORMAL HIGH (ref 150–400)
RBC: 2.96 MIL/uL — ABNORMAL LOW (ref 4.22–5.81)
RDW: 16.5 % — ABNORMAL HIGH (ref 11.5–15.5)
WBC: 16.3 10*3/uL — ABNORMAL HIGH (ref 4.0–10.5)
nRBC: 0 % (ref 0.0–0.2)

## 2022-06-05 LAB — RESP PANEL BY RT-PCR (FLU A&B, COVID) ARPGX2
Influenza A by PCR: NEGATIVE
Influenza B by PCR: NEGATIVE
SARS Coronavirus 2 by RT PCR: NEGATIVE

## 2022-06-05 LAB — GLUCOSE, CAPILLARY
Glucose-Capillary: 128 mg/dL — ABNORMAL HIGH (ref 70–99)
Glucose-Capillary: 192 mg/dL — ABNORMAL HIGH (ref 70–99)

## 2022-06-05 LAB — AMYLASE: Amylase: 42 U/L (ref 28–100)

## 2022-06-05 LAB — PROTIME-INR
INR: 1.2 (ref 0.8–1.2)
Prothrombin Time: 15.4 seconds — ABNORMAL HIGH (ref 11.4–15.2)

## 2022-06-05 LAB — CREATININE, SERUM
Creatinine, Ser: 0.81 mg/dL (ref 0.61–1.24)
GFR, Estimated: >60 mL/min (ref >60)

## 2022-06-05 LAB — LIPASE, BLOOD: Lipase: 18 U/L (ref 11–51)

## 2022-06-05 MED ORDER — LACTATED RINGERS IV BOLUS (SEPSIS)
250.0000 mL | Freq: Once | INTRAVENOUS | Status: AC
  Administered 2022-06-06: 250 mL via INTRAVENOUS

## 2022-06-05 MED ORDER — IOHEXOL 300 MG/ML  SOLN
100.0000 mL | Freq: Once | INTRAMUSCULAR | Status: AC | PRN
  Administered 2022-06-05: 100 mL via INTRAVENOUS

## 2022-06-05 MED ORDER — SODIUM CHLORIDE 0.9 % IV SOLN
2.0000 g | Freq: Once | INTRAVENOUS | Status: AC
  Administered 2022-06-05: 2 g via INTRAVENOUS
  Filled 2022-06-05: qty 12.5

## 2022-06-05 MED ORDER — VANCOMYCIN HCL 1500 MG/300ML IV SOLN
1500.0000 mg | Freq: Once | INTRAVENOUS | Status: AC
  Administered 2022-06-05: 1500 mg via INTRAVENOUS
  Filled 2022-06-05: qty 300

## 2022-06-05 MED ORDER — LACTATED RINGERS IV SOLN: INTRAVENOUS | Status: DC

## 2022-06-05 MED ORDER — VANCOMYCIN HCL 1250 MG/250ML IV SOLN: 1250.0000 mg | INTRAVENOUS | Status: DC

## 2022-06-05 MED ORDER — LACTATED RINGERS IV BOLUS (SEPSIS)
1000.0000 mL | Freq: Once | INTRAVENOUS | Status: AC
  Administered 2022-06-05: 1000 mL via INTRAVENOUS

## 2022-06-05 MED ORDER — DOCUSATE SODIUM 100 MG PO CAPS
100.0000 mg | ORAL_CAPSULE | Freq: Two times a day (BID) | ORAL | 0 refills | Status: DC
  Filled 2022-06-05: qty 30, 15d supply, fill #0

## 2022-06-05 MED ORDER — METHOCARBAMOL 500 MG PO TABS
500.0000 mg | ORAL_TABLET | Freq: Four times a day (QID) | ORAL | 0 refills | Status: DC | PRN
  Filled 2022-06-05: qty 15, 4d supply, fill #0

## 2022-06-05 MED ORDER — LACTATED RINGERS IV BOLUS (SEPSIS)
1000.0000 mL | Freq: Once | INTRAVENOUS | Status: AC
  Administered 2022-06-06: 1000 mL via INTRAVENOUS

## 2022-06-05 MED ORDER — METRONIDAZOLE 500 MG/100ML IV SOLN
500.0000 mg | Freq: Once | INTRAVENOUS | Status: AC
  Administered 2022-06-05: 500 mg via INTRAVENOUS
  Filled 2022-06-05: qty 100

## 2022-06-05 MED ORDER — ONDANSETRON 4 MG PO TBDP
4.0000 mg | ORAL_TABLET | Freq: Four times a day (QID) | ORAL | 0 refills | Status: DC | PRN
  Filled 2022-06-05: qty 20, 5d supply, fill #0

## 2022-06-05 MED ORDER — ACETAMINOPHEN 500 MG PO TABS
1000.0000 mg | ORAL_TABLET | Freq: Once | ORAL | Status: AC
  Administered 2022-06-05: 1000 mg via ORAL
  Filled 2022-06-05: qty 2

## 2022-06-05 MED ORDER — OXYCODONE HCL 5 MG PO TABS
5.0000 mg | ORAL_TABLET | Freq: Four times a day (QID) | ORAL | 0 refills | Status: AC | PRN
  Filled 2022-06-05: qty 15, 4d supply, fill #0

## 2022-06-05 MED ORDER — SODIUM CHLORIDE 0.9 % IV SOLN
2.0000 g | Freq: Three times a day (TID) | INTRAVENOUS | Status: DC
  Administered 2022-06-06: 2 g via INTRAVENOUS
  Filled 2022-06-05: qty 12.5

## 2022-06-05 MED ORDER — VANCOMYCIN HCL IN DEXTROSE 1-5 GM/200ML-% IV SOLN: 1000.0000 mg | Freq: Once | INTRAVENOUS | Status: DC

## 2022-06-05 NOTE — Plan of Care (Signed)

## 2022-06-05 NOTE — ED Triage Notes (Signed)
Pt brought in by EMS for sepsis concerns. Patient was recently discharged from hospital today and was home for 2 hours before starting to develop general weakness and increased temperature.   VSS w/ EMS. Except for temp of 102 F

## 2022-06-05 NOTE — Discharge Summary (Signed)
Physician Discharge Summary   Patient ID: Kevin [name] [id number] 71 y.o. 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 He has been treated with Gleevec and has had some periods of noncompliance with Gleevec, but has been on treatment for the last several months with a decrease in size of his tumor and no evidence of metastatic disease. He has agreed to undergo surgical resection.  Hospital Course: The patient was taken to the operating room on 6/21 for a laparoscopic converted to open partial gastrectomy, distal pancreatectomy, and splenectomy. A surgical JP drain was left in place. For further details of the procedure, please see separately dictated operative note. Postoperatively he was admitted to the med-surg floor in stable condition. Foley was removed on POD1 and NG tue was removed on POD2. He was slowly advanced from clear liquids to a full liquid diet, which he tolerated. He was then advanced to a soft diet. He began passing flatus. POD3 drain amylase was low at 148 (compared to 400 on POD1) and the drain was removed on POD5. The patient was able to ambulate independently. On the morning of POD7, he was tolerating a soft diet, passing flatus, pain was controlled with oral medication, and he was able to ambulate. He was examined and deemed appropriate for discharge home. Post-splenectomy vaccines were given on the day of discharge.  Consults: None  Significant Diagnostic Studies: Surgical Pathology: A. STOMACH, DISTAL PANCREAS AND SPLEEN, PARTIAL  GASTRECTOMY, SPLENECTOMY  AND DISTAL PANCREATECTOMY:  Gastrointestinal stromal tumor (GIST) with extensive necrosis  Tumor measures 14.7 x 13.2 x 8.1 cm  Tumor arises within the stomach and ulcerates into the gastric mucosa  Tumor shows pushing margins abutting the spleen and pancreas  Margins free  Thirteen lymph nodes negative for tumor (0/13)   ONCOLOGY TABLE:   GASTROINTESTINAL STROMAL TUMOR (GIST): Resection   Procedure: Partial gastrectomy, splenectomy and distal pancreatectomy  Tumor Focality: Unifocal  Multiple Primary Sites: Not applicable  Tumor Site: Tumor arises within the stomach ulcerating mucosa and abuts  the spleen and pancreas  Tumor Size: 14.7 x 13.2 x 8.1  Histologic Type: Gastrointestinal stromal tumor, spindle cell type  Mitotic Rate: 49 mitoses per 86m2  Histologic Grade: High-grade  Necrosis: Present comprising approximately 95% of the tumor  Treatment Effect: Treatment effect present  Risk Assessment: High risk (86%)  Margins: All margins negative for tumor       Closest Margin(s) to GIST: Abdominal serosal margin       Distance from Closest Margin (mm or cm): 0.15 mm  Regional Lymph Nodes:       Number of Lymph Nodes with Tumor: 0       Number of Lymph Nodes Examined: 13  Distant Metastasis: Not applicable  Pathologic Stage Classification (pTNM, AJCC 8th Edition): pT4, pN0   Treatments: IV hydration, analgesia: acetaminophen and robaxin and oxycodone, therapies: PT, and surgery: laparoscopic converted to open partial gastrectomy, distal pancreatectomy, splenectomy  Discharge Exam: General: resting comfortably, NAD Neuro: alert and oriented, no focal deficits Resp: normal work of breathing on room air Abdomen: soft, nondistended, nontender to palpation.  Incisions clean and dry with no erythema or induration. Extremities: warm and well-perfused   Disposition: Discharge disposition: 01-Home or Self Care       Patient Instructions:  Allergies  as of 06/05/2022   No Known Allergies      Medication List     TAKE these medications    acetaminophen 500 MG tablet Commonly known as: TYLENOL Take 1,000 mg by mouth every 6 (six) hours as needed for mild pain.   amLODipine 10 MG tablet Commonly known as: NORVASC Tome 1 tableta por va oral diariamente. (TAKE 1 TABLET BY MOUTH DAILY.) What changed: how much to take   docusate sodium 100 MG capsule Commonly known as: COLACE Take 1 capsule (100 mg total) by mouth 2 (two) times daily.   imatinib 100 MG tablet Commonly known as: GLEEVEC TAKE 4 TABLETS (400 MG) BY MOUTH ONCE DAILY. TAKE WITH MEALS AND A FULL GLASS OF WATER. CAUTION: CHEMOTHERAPY What changed:  how much to take how to take this when to take this   lisinopril 10 MG tablet Commonly known as: ZESTRIL Tome 1 tableta (10 mg en total) por va oral diariamente. (Take 1 tablet (10 mg total) by mouth daily.)   methocarbamol 500 MG tablet Commonly known as: ROBAXIN Take 1 tablet (500 mg total) by mouth every 6 (six) hours as needed for muscle spasms.   ondansetron 4 MG disintegrating tablet Commonly known as: ZOFRAN-ODT Take 1 tablet (4 mg total) by mouth every 6 (six) hours as needed for nausea.   oxyCODONE 5 MG immediate release tablet Commonly known as: Oxy IR/ROXICODONE Take 1 tablet (5 mg total) by mouth every 6 (six) hours as needed for up to 5 days for severe pain.   potassium chloride SA 20 MEQ tablet Commonly known as: KLOR-CON M Tome una tableta (20 mEq en total) por va oral diariamente. (Take 1 tablet (20 mEq total) by mouth daily.)       Activity: no driving while on analgesics and no heavy lifting for 6 weeks Diet:  Soft diet Wound Care: keep wound clean and dry  Follow-up with Dr. Zenia Resides in 2-3 weeks.  Signed: Dwan Bolt 06/05/2022 5:11 PM

## 2022-06-05 NOTE — Progress Notes (Signed)
Kevin [name] admitted for surgery due to Diagnosis of Malignant Gastro-Tumor with surgery completed without complications.  Mid-line abdominal incision with edges well approximated, sealed with skin glue with no drainage or s/s of infection. Complained of abdominal tenderness/pain earlier and given PRN pain med by mouth with relief voiced. IV sites discontinued with both tips intact, no bleeding and no s/s of infection noted, guaze dressing placed.  All personal belongings returned with family at bedside. Discharged to home with family at his side via personal transport/car.

## 2022-06-05 NOTE — Plan of Care (Signed)
Problem: Education: Goal: Knowledge of General Education information will improve Description: Including pain rating scale, medication(s)/side effects and non-pharmacologic comfort measures 06/05/2022 1326 by Lichelle Viets, Vickii Chafe, RN Outcome: Completed/Met 06/05/2022 1236 by Shelita Steptoe, Vickii Chafe, RN Outcome: Progressing   Problem: Health Behavior/Discharge Planning: Goal: Ability to manage health-related needs will improve 06/05/2022 1326 by Oddie Kuhlmann, Vickii Chafe, RN Outcome: Completed/Met 06/05/2022 1236 by Iyonnah Ferrante, Vickii Chafe, RN Outcome: Progressing   Problem: Clinical Measurements: Goal: Ability to maintain clinical measurements within normal limits will improve 06/05/2022 1326 by Jowel Waltner, Vickii Chafe, RN Outcome: Completed/Met 06/05/2022 1236 by Santita Hunsberger, Vickii Chafe, RN Outcome: Progressing Goal: Will remain free from infection 06/05/2022 1326 by Cathren Sween, Vickii Chafe, RN Outcome: Completed/Met 06/05/2022 1236 by Dioselina Brumbaugh, Vickii Chafe, RN Outcome: Progressing Goal: Diagnostic test results will improve 06/05/2022 1326 by Neida Ellegood, Vickii Chafe, RN Outcome: Completed/Met 06/05/2022 1236 by Shanekqua Schaper, Vickii Chafe, RN Outcome: Progressing Goal: Respiratory complications will improve 06/05/2022 1326 by Samayra Hebel, Vickii Chafe, RN Outcome: Completed/Met 06/05/2022 1236 by Donnesha Karg, Vickii Chafe, RN Outcome: Progressing Goal: Cardiovascular complication will be avoided 06/05/2022 1326 by Reznor Ferrando, Vickii Chafe, RN Outcome: Completed/Met 06/05/2022 1236 by Jannie Doyle, Vickii Chafe, RN Outcome: Progressing   Problem: Activity: Goal: Risk for activity intolerance will decrease 06/05/2022 1326 by Katriel Cutsforth, Vickii Chafe, RN Outcome: Completed/Met 06/05/2022 1236 by Amillya Chavira, Vickii Chafe, RN Outcome: Progressing   Problem: Nutrition: Goal: Adequate nutrition will be maintained 06/05/2022 1326 by Samyra Limb, Vickii Chafe, RN Outcome: Completed/Met 06/05/2022 1236 by Cantrell Martus, Vickii Chafe, RN Outcome: Progressing   Problem: Coping: Goal: Level of anxiety will decrease 06/05/2022 1326 by Bronwen Pendergraft,  Vickii Chafe, RN Outcome: Completed/Met 06/05/2022 1236 by Juri Dinning, Vickii Chafe, RN Outcome: Progressing   Problem: Elimination: Goal: Will not experience complications related to bowel motility 06/05/2022 1326 by Bentleigh Waren, Vickii Chafe, RN Outcome: Completed/Met 06/05/2022 1236 by Jakeia Carreras, Vickii Chafe, RN Outcome: Progressing Goal: Will not experience complications related to urinary retention 06/05/2022 1326 by Tsuyako Jolley, Vickii Chafe, RN Outcome: Completed/Met 06/05/2022 1236 by Shannell Mikkelsen, Vickii Chafe, RN Outcome: Progressing   Problem: Pain Managment: Goal: General experience of comfort will improve 06/05/2022 1326 by Gerrianne Aydelott, Vickii Chafe, RN Outcome: Completed/Met 06/05/2022 1236 by Kamal Jurgens, Vickii Chafe, RN Outcome: Progressing   Problem: Safety: Goal: Ability to remain free from injury will improve 06/05/2022 1326 by Kearra Calkin, Vickii Chafe, RN Outcome: Completed/Met 06/05/2022 1236 by Ponciano Shealy, Vickii Chafe, RN Outcome: Progressing   Problem: Skin Integrity: Goal: Risk for impaired skin integrity will decrease 06/05/2022 1326 by Alyxis Grippi, Vickii Chafe, RN Outcome: Completed/Met 06/05/2022 1236 by Symantha Steeber, Vickii Chafe, RN Outcome: Progressing   Problem: Education: Goal: Ability to describe self-care measures that may prevent or decrease complications (Diabetes Survival Skills Education) will improve 06/05/2022 1326 by Tashona Calk, Vickii Chafe, RN Outcome: Completed/Met 06/05/2022 1236 by Morene Cecilio, Vickii Chafe, RN Outcome: Progressing Goal: Individualized Educational Video(s) 06/05/2022 1326 by Azalea Cedar, Vickii Chafe, RN Outcome: Completed/Met 06/05/2022 1236 by Patricia Perales, Vickii Chafe, RN Outcome: Progressing   Problem: Coping: Goal: Ability to adjust to condition or change in health will improve 06/05/2022 1326 by Karrine Kluttz, Vickii Chafe, RN Outcome: Completed/Met 06/05/2022 1236 by Jeda Pardue, Vickii Chafe, RN Outcome: Progressing   Problem: Fluid Volume: Goal: Ability to maintain a balanced intake and output will improve 06/05/2022 1326 by Tamaria Dunleavy, Vickii Chafe, RN Outcome:  Completed/Met 06/05/2022 1236 by Chenika Nevils, Vickii Chafe, RN Outcome: Progressing   Problem: Health Behavior/Discharge Planning: Goal: Ability to identify and utilize available resources and services will improve 06/05/2022 1326 by Arion Morgan, Vickii Chafe, RN Outcome: Completed/Met 06/05/2022 1236 by Hennessey Cantrell, Vickii Chafe,  RN Outcome: Progressing Goal: Ability to manage health-related needs will improve 06/05/2022 1326 by Adelae Yodice, Vickii Chafe, RN Outcome: Completed/Met 06/05/2022 1236 by Daniqua Campoy, Vickii Chafe, RN Outcome: Progressing   Problem: Metabolic: Goal: Ability to maintain appropriate glucose levels will improve 06/05/2022 1326 by Kramer Hanrahan, Vickii Chafe, RN Outcome: Completed/Met 06/05/2022 1236 by Kishaun Erekson, Vickii Chafe, RN Outcome: Progressing   Problem: Nutritional: Goal: Maintenance of adequate nutrition will improve 06/05/2022 1326 by Tamarah Bhullar, Vickii Chafe, RN Outcome: Completed/Met 06/05/2022 1236 by Raistlin Gum, Vickii Chafe, RN Outcome: Progressing Goal: Progress toward achieving an optimal weight will improve 06/05/2022 1326 by Brookelynne Dimperio, Vickii Chafe, RN Outcome: Completed/Met 06/05/2022 1236 by Dereona Kolodny, Vickii Chafe, RN Outcome: Progressing   Problem: Skin Integrity: Goal: Risk for impaired skin integrity will decrease 06/05/2022 1326 by Celinda Dethlefs, Vickii Chafe, RN Outcome: Completed/Met 06/05/2022 1236 by Malic Rosten, Vickii Chafe, RN Outcome: Progressing   Problem: Tissue Perfusion: Goal: Adequacy of tissue perfusion will improve 06/05/2022 1326 by Torence Palmeri, Vickii Chafe, RN Outcome: Completed/Met 06/05/2022 1236 by Shalom Mcguiness, Vickii Chafe, RN Outcome: Progressing

## 2022-06-05 NOTE — Progress Notes (Signed)
Pharmacy Antibiotic Note  Kevin [name] is a 71 y.o. male for which pharmacy has been consulted for cefepime and vancomycin dosing for sepsis.  Patient with a history of malignant Gastrointestinal stromal tumor, tobacco use, anemia, HTN. Patient presenting with fever and generalized weakness.  Recent admission for surgical resection of the tumor. S/p laparoscopic converted to open partial gastrectomy, distal pancreatectomy, and splenectomy.  SCr 0.93 - at baseline WBC 16.3; LA 1.3; T 97.6 F; HR 102; RR 22  Plan: Metronidazole per MD Cefepime 2g q8hr Vancomycin 1500 mg once then 1250 mg q24hr (eAUC 502.7) unless change in renal function Trend WBC, Fever, Renal function, & Clinical course F/u cultures, clinical course, WBC, fever De-escalate when able Levels at steady state     Temp (24hrs), Avg:98.3 F (36.8 C), Min:97.6 F (36.4 C), Max:98.7 F (37.1 C)  Recent Labs  Lab 05/30/22 0032 05/31/22 0038 06/01/22 0034 06/05/22 0611  WBC 14.8* 18.2* 15.3*  --   CREATININE 0.78 0.65 0.70 0.81    Estimated Creatinine Clearance: 71.3 mL/min (by C-G formula based on SCr of 0.81 mg/dL).    No Known Allergies  Antimicrobials this admission: cefepime 6/28 >>  metronidazole 6/28 >>  vancomycin 6/28 >>   Microbiology results: Pending  Thank you for allowing pharmacy to be a part of this patient's care.  Lorelei Pont, PharmD, BCPS 06/05/2022 9:28 PM ED Clinical Pharmacist -  [phone]

## 2022-06-05 NOTE — Sepsis Progress Note (Signed)
Following per sepsis protocol   

## 2022-06-05 NOTE — ED Provider Notes (Signed)
[facility] EMERGENCY DEPARTMENT Provider Note   CSN: [id number] Arrival date & time: 06/05/22  2111     History {Add pertinent medical, surgical, social history, OB history to HPI:1} Chief Complaint  Patient presents with   Code Sepsis    Kevin [name] is a 72 y.o. male.  HPI Patient presents for fever and generalized weakness.  His medical history includes tobacco use, anemia, HTN, and a malignant GIST of stomach.  GIST was diagnosed 2 years ago.  He was admitted to the hospital 1 week ago for surgical resection of the tumor.  On 6/21, he underwent laparoscopic converted to open partial gastrectomy, distal pancreatectomy, and splenectomy.  Postoperatively, he advance to a full diet.  JP drain was removed on POD 5 he was discharged home today on POD 7.  Patient reports that, within 2 hours of arriving home, he developed fever and generalized weakness.  He denies any worsened abdominal pain.    Home Medications Prior to Admission medications   Medication Sig Start Date End Date Taking? Authorizing Provider  acetaminophen (TYLENOL) 500 MG tablet Take 1,000 mg by mouth every 6 (six) hours as needed for mild pain.    [provider]  amLODipine (NORVASC) 10 MG tablet TAKE 1 TABLET BY MOUTH DAILY. Patient taking differently: Take 10 mg by mouth daily. 08/16/21 08/16/22  Ladell Pier, MD  docusate sodium (COLACE) 100 MG capsule Take 1 capsule (100 mg total) by mouth 2 (two) times daily. 06/05/22   Dwan Bolt, MD  imatinib (GLEEVEC) 100 MG tablet TAKE 4 TABLETS (400 MG) BY MOUTH ONCE DAILY. TAKE WITH MEALS AND A FULL GLASS OF WATER. CAUTION: CHEMOTHERAPY Patient taking differently: Take 400 mg by mouth daily. 05/16/22 05/16/23  Ladell Pier, MD  lisinopril (ZESTRIL) 10 MG tablet Take 1 tablet (10 mg total) by mouth daily. 08/16/21   Ladell Pier, MD  methocarbamol (ROBAXIN) 500 MG tablet Take 1 tablet (500 mg total) by mouth every 6 (six) hours as needed  for muscle spasms. 06/05/22   Dwan Bolt, MD  ondansetron (ZOFRAN-ODT) 4 MG disintegrating tablet Take 1 tablet (4 mg total) by mouth every 6 (six) hours as needed for nausea. 06/05/22   Dwan Bolt, MD  oxyCODONE (OXY IR/ROXICODONE) 5 MG immediate release tablet Take 1 tablet (5 mg total) by mouth every 6 (six) hours as needed for up to 5 days for severe pain. 06/05/22 06/10/22  Dwan Bolt, MD  potassium chloride SA (KLOR-CON M) 20 MEQ tablet Take 1 tablet (20 mEq total) by mouth daily. Patient not taking: Reported on 05/27/2022 05/13/22   Owens Shark, NP      Allergies    Patient has no known allergies.    Review of Systems   Review of Systems  Constitutional:  Positive for fatigue and fever.  Neurological:  Positive for weakness (Generalized).    Physical Exam Updated Vital Signs There were no vitals taken for this visit. Physical Exam Vitals and nursing note reviewed.  Constitutional:      General: He is not in acute distress.    Appearance: Normal appearance. He is well-developed and normal weight. He is ill-appearing. He is not toxic-appearing or diaphoretic.  HENT:     Head: Normocephalic and atraumatic.     Right Ear: External ear normal.     Left Ear: External ear normal.     Nose: Nose normal.     Mouth/Throat:  Mouth: Mucous membranes are moist.     Pharynx: Oropharynx is clear.  Eyes:     Extraocular Movements: Extraocular movements intact.     Conjunctiva/sclera: Conjunctivae normal.  Cardiovascular:     Rate and Rhythm: Regular rhythm. Tachycardia present.     Heart sounds: No murmur heard. Pulmonary:     Effort: Pulmonary effort is normal. No respiratory distress.     Breath sounds: Normal breath sounds. No wheezing or rales.  Chest:     Chest wall: No tenderness.  Abdominal:     General: There is no distension.     Palpations: Abdomen is soft.     Tenderness: There is abdominal tenderness (Mild). There is no guarding or rebound.     Comments:  Well-healing ex lap scar on midline of anterior abdomen  Musculoskeletal:        General: No swelling. Normal range of motion.     Cervical back: Normal range of motion and neck supple. No rigidity.     Right lower leg: No edema.     Left lower leg: No edema.  Skin:    General: Skin is warm and dry.     Capillary Refill: Capillary refill takes less than 2 seconds.     Coloration: Skin is not jaundiced or pale.  Neurological:     General: No focal deficit present.     Mental Status: He is alert and oriented to person, place, and time.     Cranial Nerves: No cranial nerve deficit.     Sensory: No sensory deficit.     Motor: No weakness.     Coordination: Coordination normal.  Psychiatric:        Mood and Affect: Mood normal.        Behavior: Behavior normal.        Thought Content: Thought content normal.        Judgment: Judgment normal.     ED Results / Procedures / Treatments   Labs (all labs ordered are listed, but only abnormal results are displayed) Labs Reviewed - No data to display  EKG None  Radiology No results found.  Procedures Procedures  {Document cardiac monitor, telemetry assessment procedure when appropriate:1}  Medications Ordered in ED Medications - No data to display  ED Course/ Medical Decision Making/ A&P                           Medical Decision Making Amount and/or Complexity of Data Reviewed Labs: ordered. Radiology: ordered. ECG/medicine tests: ordered.  Risk OTC drugs. Prescription drug management.   ***  {Document critical care time when appropriate:1} {Document review of labs and clinical decision tools ie heart score, Chads2Vasc2 etc:1}  {Document your independent review of radiology images, and any outside records:1} {Document your discussion with family members, caretakers, and with consultants:1} {Document social determinants of health affecting pt's care:1} {Document your decision making why or why not admission,  treatments were needed:1} Final Clinical Impression(s) / ED Diagnoses Final diagnoses:  None    Rx / DC Orders ED Discharge Orders     None

## 2022-06-06 ENCOUNTER — Encounter: Payer: Self-pay | Admitting: *Deleted

## 2022-06-06 ENCOUNTER — Encounter (HOSPITAL_COMMUNITY): Payer: Self-pay | Admitting: Internal Medicine

## 2022-06-06 DIAGNOSIS — I10 Essential (primary) hypertension: Secondary | ICD-10-CM

## 2022-06-06 DIAGNOSIS — A419 Sepsis, unspecified organism: Secondary | ICD-10-CM

## 2022-06-06 DIAGNOSIS — R531 Weakness: Secondary | ICD-10-CM

## 2022-06-06 DIAGNOSIS — D649 Anemia, unspecified: Secondary | ICD-10-CM

## 2022-06-06 LAB — CBC WITH DIFFERENTIAL/PLATELET
Abs Immature Granulocytes: 0.18 10*3/uL — ABNORMAL HIGH (ref 0.00–0.07)
Basophils Absolute: 0 10*3/uL (ref 0.0–0.1)
Basophils Relative: 0 %
Eosinophils Absolute: 0.1 10*3/uL (ref 0.0–0.5)
Eosinophils Relative: 1 %
HCT: 23.1 % — ABNORMAL LOW (ref 39.0–52.0)
Hemoglobin: 7.5 g/dL — ABNORMAL LOW (ref 13.0–17.0)
Immature Granulocytes: 1 %
Lymphocytes Relative: 7 %
Lymphs Abs: 1.3 10*3/uL (ref 0.7–4.0)
MCH: 28 pg (ref 26.0–34.0)
MCHC: 32.5 g/dL (ref 30.0–36.0)
MCV: 86.2 fL (ref 80.0–100.0)
Monocytes Absolute: 1.6 10*3/uL — ABNORMAL HIGH (ref 0.1–1.0)
Monocytes Relative: 8 %
Neutro Abs: 15.8 10*3/uL — ABNORMAL HIGH (ref 1.7–7.7)
Neutrophils Relative %: 83 %
Platelets: 755 10*3/uL — ABNORMAL HIGH (ref 150–400)
RBC: 2.68 MIL/uL — ABNORMAL LOW (ref 4.22–5.81)
RDW: 16.5 % — ABNORMAL HIGH (ref 11.5–15.5)
WBC: 19 10*3/uL — ABNORMAL HIGH (ref 4.0–10.5)
nRBC: 0 % (ref 0.0–0.2)

## 2022-06-06 LAB — URINALYSIS, ROUTINE W REFLEX MICROSCOPIC
Bacteria, UA: NONE SEEN
Bilirubin Urine: NEGATIVE
Glucose, UA: NEGATIVE mg/dL
Ketones, ur: NEGATIVE mg/dL
Leukocytes,Ua: NEGATIVE
Nitrite: NEGATIVE
Protein, ur: NEGATIVE mg/dL
Specific Gravity, Urine: 1.023 (ref 1.005–1.030)
pH: 5 (ref 5.0–8.0)

## 2022-06-06 LAB — LACTIC ACID, PLASMA: Lactic Acid, Venous: 1 mmol/L (ref 0.5–1.9)

## 2022-06-06 LAB — COMPREHENSIVE METABOLIC PANEL
ALT: 14 U/L (ref 0–44)
AST: 16 U/L (ref 15–41)
Albumin: 1.9 g/dL — ABNORMAL LOW (ref 3.5–5.0)
Alkaline Phosphatase: 75 U/L (ref 38–126)
Anion gap: 11 (ref 5–15)
BUN: 12 mg/dL (ref 8–23)
CO2: 22 mmol/L (ref 22–32)
Calcium: 8.1 mg/dL — ABNORMAL LOW (ref 8.9–10.3)
Chloride: 104 mmol/L (ref 98–111)
Creatinine, Ser: 0.78 mg/dL (ref 0.61–1.24)
GFR, Estimated: >60 mL/min (ref >60)
Glucose, Bld: 128 mg/dL — ABNORMAL HIGH (ref 70–99)
Potassium: 3.6 mmol/L (ref 3.5–5.1)
Sodium: 137 mmol/L (ref 135–145)
Total Bilirubin: 0.7 mg/dL (ref 0.3–1.2)
Total Protein: 5.9 g/dL — ABNORMAL LOW (ref 6.5–8.1)

## 2022-06-06 LAB — PROCALCITONIN: Procalcitonin: 0.1 ng/mL

## 2022-06-06 LAB — GLUCOSE, CAPILLARY: Glucose-Capillary: 120 mg/dL — ABNORMAL HIGH (ref 70–99)

## 2022-06-06 LAB — MAGNESIUM: Magnesium: 1.8 mg/dL (ref 1.7–2.4)

## 2022-06-06 MED ORDER — DOCUSATE SODIUM 100 MG PO CAPS
100.0000 mg | ORAL_CAPSULE | Freq: Two times a day (BID) | ORAL | Status: DC
  Administered 2022-06-06 (×2): 100 mg via ORAL
  Filled 2022-06-06 (×3): qty 1

## 2022-06-06 MED ORDER — OXYCODONE HCL 5 MG PO TABS: 5.0000 mg | ORAL_TABLET | Freq: Four times a day (QID) | ORAL | Status: DC | PRN

## 2022-06-06 MED ORDER — ACETAMINOPHEN 325 MG PO TABS: 650.0000 mg | ORAL_TABLET | Freq: Four times a day (QID) | ORAL | Status: DC | PRN

## 2022-06-06 MED ORDER — METHOCARBAMOL 500 MG PO TABS: 500.0000 mg | ORAL_TABLET | Freq: Four times a day (QID) | ORAL | Status: DC | PRN

## 2022-06-06 MED ORDER — ACETAMINOPHEN 650 MG RE SUPP: 650.0000 mg | Freq: Four times a day (QID) | RECTAL | Status: DC | PRN

## 2022-06-06 MED ORDER — ENOXAPARIN SODIUM 40 MG/0.4ML IJ SOSY
40.0000 mg | PREFILLED_SYRINGE | Freq: Every day | INTRAMUSCULAR | Status: DC
  Administered 2022-06-06 – 2022-06-07 (×2): 40 mg via SUBCUTANEOUS
  Filled 2022-06-06 (×2): qty 0.4

## 2022-06-06 MED ORDER — METRONIDAZOLE 500 MG/100ML IV SOLN: 500.0000 mg | Freq: Two times a day (BID) | INTRAVENOUS | Status: DC

## 2022-06-06 NOTE — Assessment & Plan Note (Signed)
#)   Chronic normocytic anemia: Documented history of such, associated baseline hemoglobin range of 8-10, with presenting hemoglobin of 8.3 consistent with this range in similar to most recent prior value 8.7 on 06/01/2022, without any overt evidence of active bleed at this time.  INR noted to be 1.2.  Presenting hemoglobin associated with normocytic/normochromic findings.  Plan: Repeat CBC in the morning.

## 2022-06-06 NOTE — Progress Notes (Signed)
Subjective Pt is known to our service - underwent Laparoscopic converted to open partial gastrectomy, distal pancreatectomy, splenectomy and resection of gastric mass by my partner Dr. 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 He recovered well and went home 9 hrs ago. Returned to ER via EMS with some degree of feeling malaise and febrile. Found in ER to have temp of 104.2.  He underwent workup in ER including CXR, UA, CT A/P   No clear source has been identified thus far  He reports he feels 'well.' He denies n/v. He does admit to feeling cold and somewhat weak. No abdominal pain or anything that has changed from that perspective over the last 3 days. He denies cough, chest pain, sob. He denies dysuria.  Objective: Vital signs in last 24 hours: Temp:  [97.6 F (36.4 C)-104.2 F (40.1 C)] 100.4 F (38 C) (06/29 0006) Pulse Rate:  [78-109] 85 (06/29 0015) Resp:  [16-31] 20 (06/29 0015) BP: (96-137)/(53-78) 96/53 (06/28 2345) SpO2:  [92 %-100 %] 95 % (06/29 0015) Weight:  [66.7 kg] 66.7 kg (06/28 2306)    Intake/Output from previous day: 06/28 0701 - 06/29 0700 In: 2749.6 [IV Piggyback:2749.6] Out: 250 [Urine:250] Intake/Output this shift: Total I/O In: 2749.6 [IV Piggyback:2749.6] Out: 250 [Urine:250]  Gen: NAD, comfortable CV: RRR Pulm: Normal work of breathing Abd: Soft, NT/ND. Incision is closed, without erythema nor drainage. Ext: SCDs in place  Lab Results: CBC  Recent Labs    06/05/22 2134  WBC 16.3*  HGB 8.3*  HCT 25.8*  PLT 812*   BMET Recent Labs    06/05/22 0611 06/05/22 2134  NA  --  137  K  --  3.6  CL  --  106  CO2  --  20*  GLUCOSE  --  169*  BUN  --  18  CREATININE 0.81 0.93  CALCIUM  --  8.1*   PT/INR Recent Labs    06/05/22 2134  LABPROT 15.4*  INR 1.2   ABG No results for input(s): "PHART", "HCO3" in the last 72 hours.  Invalid input(s): "PCO2", "PO2"  Studies/Results:  Anti-infectives: Anti-infectives (From admission, onward)    Start      Dose/Rate Route Frequency Ordered Stop   06/06/22 2300  vancomycin (VANCOREADY) IVPB 1250 mg/250 mL        1,250 mg 166.7 mL/hr over 90 Minutes Intravenous Every 24 hours 06/05/22 2244     06/06/22 1000  metroNIDAZOLE (FLAGYL) IVPB 500 mg        500 mg 100 mL/hr over 60 Minutes Intravenous 2 times daily 06/06/22 0059     06/06/22 0600  ceFEPIme (MAXIPIME) 2 g in sodium chloride 0.9 % 100 mL IVPB        2 g 200 mL/hr over 30 Minutes Intravenous Every 8 hours 06/05/22 2244     06/05/22 2130  ceFEPIme (MAXIPIME) 2 g in sodium chloride 0.9 % 100 mL IVPB        2 g 200 mL/hr over 30 Minutes Intravenous  Once 06/05/22 2119 06/05/22 2235   06/05/22 2130  metroNIDAZOLE (FLAGYL) IVPB 500 mg        500 mg 100 mL/hr over 60 Minutes Intravenous  Once 06/05/22 2119 06/06/22 0002   06/05/22 2130  vancomycin (VANCOCIN) IVPB 1000 mg/200 mL premix  Status:  Discontinued        1,000 mg 200 mL/hr over 60 Minutes Intravenous  Once 06/05/22 2119 06/05/22 2127   06/05/22 2130  vancomycin (VANCOREADY) IVPB 1500 mg/300 mL  1,500 mg 150 mL/hr over 120 Minutes Intravenous  Once 06/05/22 2127 06/06/22 0103        Assessment/Plan: Patient Active Problem List   Diagnosis Date Noted   Sepsis ([place]) 06/06/2022   Gastrointestinal stromal tumor (GIST) ([place]) 05/29/2022   SIRS (systemic inflammatory response syndrome) ([place]) 04/17/2022   Lymphocytopenia 04/17/2022   Positive blood culture 04/17/2022   Hypocalcemia 04/15/2022   Elevated troponin 04/15/2022   Essential hypertension 05/11/2021   Malignant gastrointestinal stromal tumor (GIST) of stomach ([place])    DNR (do not resuscitate)    Palliative care by specialist    Protein-calorie malnutrition, severe 01/31/2020   Weight loss, unintentional 01/24/2020   Tobacco abuse 01/24/2020   Gastric mass 01/22/2020   Normocytic anemia 01/21/2020   Mass of stomach 01/21/2020   -Source is unclear; receiving empiric IV abx per sepsis protocol -Agree with at  least observation for monitoring  -Ok for diet as tolerated -We will follow with you; I will let Dr. Zenia Resides know in AM that he is being observed here   LOS: 0 days   Nadeen Landau, MD [facility] Surgery, [place]

## 2022-06-06 NOTE — Plan of Care (Signed)

## 2022-06-06 NOTE — Evaluation (Signed)
Physical Therapy Evaluation Patient Details Name: JAVI BOLLMAN MRN: [id number] DOB: 1951-11-22 Today's Date: 06/06/2022  History of Present Illness  is a 71 y.o. 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  Clinical Impression  Pt agreeable to physical therapy evaluation and interpretor utilized. Pt independent with all mobility and has no acute care physical therapy needs.      Recommendations for follow up therapy are one component of a multi-disciplinary discharge planning process, led by the attending physician.  Recommendations may be updated based on patient status, additional functional criteria and insurance authorization.  Follow Up Recommendations No PT follow up      Assistance Recommended at Discharge None  Patient can return home with the following  Assist for transportation    Equipment Recommendations None recommended by PT  Recommendations for Other Services       Functional Status Assessment Patient has not had a recent decline in their functional status     Precautions / Restrictions Precautions Precautions: None Restrictions Weight Bearing Restrictions: No      Mobility  Bed Mobility Overal bed mobility: Independent Bed Mobility: Sit to Supine, Supine to Sit     Supine to sit: Independent Sit to supine: Independent        Transfers Overall transfer level: Independent Equipment used: None Transfers: Sit to/from Stand Sit to Stand: Independent                Ambulation/Gait Ambulation/Gait assistance: Independent Gait Distance (Feet): 350 Feet Assistive device: None Gait Pattern/deviations: WFL(Within Functional Limits)    Gait velocity interpretation: 1.31 - 2.62 ft/sec, indicative of limited community ambulator   General Gait Details: No signficant gait deficits present. Pt appears at baseline level.  Stairs            Wheelchair Mobility    Modified Rankin (Stroke Patients Only)       Balance Overall balance assessment: Independent (Stable with FTEO)                                           Pertinent Vitals/Pain Pain Assessment Pain Assessment: No/denies pain    Home Living Family/patient expects to be discharged to:: Private residence Living Arrangements: Children;Spouse/significant other Available Help at Discharge: Family;Available 24 hours/day (DTR does not work; wife works) Type of Home: [employer] Access: Stairs to enter Chiropractor: Can reach both Technical brewer of Steps: [place]: One level Home Equipment: Wheelchair - manual (does not use)      Prior Function Prior Level of Function : Independent/Modified Independent (does not drive due to ongoing medical issues; worked previously in 2021)             Mobility Comments: No assistive device ADLs Comments: Pt able to get his own groceries and run errands if his son drives him.     Hand Dominance   Dominant Hand: Right    Extremity/Trunk Assessment   Upper Extremity Assessment Upper Extremity Assessment: Overall WFL for tasks assessed    Lower Extremity Assessment Lower Extremity Assessment: Overall WFL for tasks assessed  Communication   Communication: Prefers language other than English (spansih)  Cognition Arousal/Alertness: Awake/alert Behavior During Therapy: WFL for tasks assessed/performed Overall Cognitive Status: Within Functional Limits for tasks assessed                                 General Comments: Requires Patent attorney        General Comments      Exercises     Assessment/Plan    PT Assessment Patient does  not need any further PT services  PT Problem List         PT Treatment Interventions      PT Goals (Current goals can be found in the Care Plan section)  Acute Rehab PT Goals PT Goal Formulation: All assessment and education complete, DC therapy    Frequency       Co-evaluation               AM-PAC PT "6 Clicks" Mobility  Outcome Measure Help needed turning from your back to your side while in a flat bed without using bedrails?: None Help needed moving from lying on your back to sitting on the side of a flat bed without using bedrails?: None Help needed moving to and from a bed to a chair (including a wheelchair)?: None Help needed standing up from a chair using your arms (e.g., wheelchair or bedside chair)?: None Help needed to walk in hospital room?: None Help needed climbing 3-5 steps with a railing? : None 6 Click Score: 24    End of Session Equipment Utilized During Treatment:  (none) Activity Tolerance: Patient tolerated treatment well Patient left: in bed;with bed alarm set;with call bell/phone within reach Nurse Communication: Mobility status PT Visit Diagnosis: Other abnormalities of gait and mobility (R26.89)    Time: 1539-1600 PT Time Calculation (min) (ACUTE ONLY): 21 min   Charges:   PT Evaluation $PT Eval Low Complexity: 1 Low          Donna Bernard, PT   [facility] 06/06/2022, 4:10 PM

## 2022-06-06 NOTE — Assessment & Plan Note (Signed)
 #)   Essential Hypertension: documented h/o such, with outpatient antihypertensive regimen including lisinopril and amlodipine.  SBP's in the ED today: Borderline low, with initial systolic blood pressures in the 90s, subsequent proving into the low 100s following interval IV fluids, as further quantified above.   Plan: Close monitoring of subsequent BP via routine VS. in the setting of presenting sepsis, and soft initial blood pressures, will hold home antihypertensive medications for now.

## 2022-06-06 NOTE — Progress Notes (Addendum)
Subjective: Patient was discharged yesterday afternoon and had fevers at home last night. Afebrile this morning. Has no acute complaints this morning. Denies abdominal pain, nausea and vomiting.   Objective: Vital signs in last 24 hours: Temp:  [97.6 F (36.4 C)-104.2 F (40.1 C)] 98.5 F (36.9 C) (06/29 0259) Pulse Rate:  [78-109] 93 (06/29 0259) Resp:  [16-31] 16 (06/29 0259) BP: (96-137)/(53-78) 132/69 (06/29 0259) SpO2:  [92 %-100 %] 98 % (06/29 0259) Weight:  [66.7 kg] 66.7 kg (06/28 2306)    Intake/Output from previous day: 06/28 0701 - 06/29 0700 In: 2749.6 [IV Piggyback:2749.6] Out: 950 [Urine:950] Intake/Output this shift: No intake/output data recorded.  PE: General: resting comfortably, NAD Neuro: alert and oriented, no focal deficits Resp: normal work of breathing on room air CV: RRR Abdomen: soft, nondistended, nontender to palpation. Incisions clean and dry with no erythema or induration. Extremities: warm and well-perfused   Lab Results:  Recent Labs    06/05/22 2134 06/06/22 0321  WBC 16.3* 19.0*  HGB 8.3* 7.5*  HCT 25.8* 23.1*  PLT 812* 755*   BMET Recent Labs    06/05/22 2134 06/06/22 0321  NA 137 137  K 3.6 3.6  CL 106 104  CO2 20* 22  GLUCOSE 169* 128*  BUN 18 12  CREATININE 0.93 0.78  CALCIUM 8.1* 8.1*   PT/INR Recent Labs    06/05/22 2134  LABPROT 15.4*  INR 1.2   CMP     Component Value Date/Time   NA 137 06/06/2022 0321   K 3.6 06/06/2022 0321   CL 104 06/06/2022 0321   CO2 22 06/06/2022 0321   GLUCOSE 128 (H) 06/06/2022 0321   BUN 12 06/06/2022 0321   CREATININE 0.78 06/06/2022 0321   CREATININE 0.94 05/09/2022 1029   CALCIUM 8.1 (L) 06/06/2022 0321   PROT 5.9 (L) 06/06/2022 0321   ALBUMIN 1.9 (L) 06/06/2022 0321   AST 16 06/06/2022 0321   AST 12 (L) 05/09/2022 1029   ALT 14 06/06/2022 0321   ALT 9 05/09/2022 1029   ALKPHOS 75 06/06/2022 0321   BILITOT 0.7 06/06/2022 0321   BILITOT 0.5 05/09/2022  1029   GFRNONAA >60 06/06/2022 0321   GFRNONAA >60 05/09/2022 1029   GFRAA >60 09/01/2020 1206   Lipase     Component Value Date/Time   LIPASE 18 06/05/2022 2134       Studies/Results: CT ABDOMEN PELVIS W CONTRAST  Result Date: 06/05/2022 CLINICAL DATA:  Postoperative sepsis with abdominal pain. History of liver abscess. GI stromal tumor. EXAM: CT ABDOMEN AND PELVIS WITH CONTRAST TECHNIQUE: Multidetector CT imaging of the abdomen and pelvis was performed using the standard protocol following bolus administration of intravenous contrast. RADIATION DOSE REDUCTION: This exam was performed according to the departmental dose-optimization program which includes automated exposure control, adjustment of the mA and/or kV according to patient size and/or use of iterative reconstruction technique. CONTRAST:  175m OMNIPAQUE IOHEXOL 300 MG/ML  SOLN COMPARISON:  CT abdomen and pelvis 04/15/2022 FINDINGS: Lower chest: There is a trace left pleural effusion which is new. There is minimal atelectasis in the left lung base. Hepatobiliary: No focal liver abnormality is seen. No gallstones, gallbladder wall thickening, or biliary dilatation. Pancreas: Patient is status post distal pancreatectomy. The head and neck of the pancreas appear within normal limits. There is mild fluid and stranding adjacent to the surgical site without enhancing fluid collection identified. Spleen: Surgically absent. Adrenals/Urinary Tract: There is a cyst in the superior  pole the left kidney measuring 2.8 cm. There is scarring in the right kidney. There is no hydronephrosis or perinephric fluid. The adrenal glands and bladder are within normal limits. Stomach/Bowel: Patient is status post removal of proximal gastric tumor. There is mild stranding surrounding the staple line without fluid collection. The stomach appears nondilated. There is no evidence for bowel obstructio. The appendix is within normal limits. Vascular/Lymphatic: Aorta and  IVC are normal in size. There are prominent retroperitoneal lymph nodes similar to the prior examination. There also some prominent gastrohepatic lymph nodes, unchanged. No new enlarged lymph nodes are seen. Reproductive: The prostate gland is enlarged measuring 5.3 by 5.3 cm similar to the prior study. Other: There is a new midline abdominal wall scar. There is a small focus of free air in fluid in the anterior upper abdomen image 4/25 likely related to recent surgery. There is also a tiny focus of free air in the left upper quadrant. No enhancing fluid collections are identified. No abdominal wall hernia. Musculoskeletal: Multilevel degenerative changes affect the spine. IMPRESSION: 1. Patient is status post removal of gastric tumor, distal pancreatectomy, and splenectomy. There is a small amount of stranding and fluid adjacent to the surgical margins without enhancing fluid collection. 2. Small amount of free air in fluid in the upper abdomen is likely postoperative. 3. Trace left pleural effusion. 4. Stable prostatomegaly. Electronically Signed   By: Ronney Asters M.D.   On: 06/05/2022 23:18   DG Chest Port 1 View  Result Date: 06/05/2022 CLINICAL DATA:  Sepsis EXAM: PORTABLE CHEST 1 VIEW COMPARISON:  04/15/2022 FINDINGS: Lung volumes are small and there is bibasilar atelectasis. No pneumothorax or pleural effusion. Cardiac size within normal limits. Pulmonary vascularity is normal. No acute bone abnormality. IMPRESSION: Pulmonary hypoinflation. Electronically Signed   By: Fidela Salisbury M.D.   On: 06/05/2022 21:36        Assessment/Plan 71 yo male with gastric GIST, POD8 s/p partial gastrectomy, distal pancreatectomy and splenectomy. He was discharged yesterday in stable condition and readmitted overnight with fevers to 40 and leukocytosis. Reactive leukocytosis is expected after splenectomy so it is unclear if this is related to sepsis. I reviewed his abdominal CT, which does not show any  large-volume free air or intraabdominal collections to explain his fevers. Serum amylase is normal and patient has no signs of postoperative pancreatitis on imaging. Source of fevers does not appear to be intraabdominal. He did receive four vaccines yesterday (post-splenectomy vaccines) shortly prior to discharge - immune response to vaccines is also a possible source of fevers. - Discontinue antibiotics as patient is hemodynamically stable with no symptoms and no identified source of fevers. If source is identified, will treat with abx as indicated. - Blood and urine cultures pending. CXR with no signs of pneumonia. - Soft diet - Ambulate TID - VTE: lovenox, SCDs - Dispo: observation, med-surg floor. Monitor for fevers. Repeat CBC tomorrow am. Surgery will take over as primary team.    LOS: 0 days    Michaelle Birks, MD [facility] Surgery General, Hepatobiliary and Pancreatic Surgery 06/06/22 7:38 AM

## 2022-06-06 NOTE — Progress Notes (Signed)
Call to [facility]) and requested they fax post-splenectomy vaccine schedule. Dr. Zenia Resides has requested our office manage this. They will email script that patients are given to schedule the vaccines.

## 2022-06-06 NOTE — Assessment & Plan Note (Signed)
#)  Sepsis of unclear source: Patient presents with objective fever, with temperature max of 104.2 in the ED, with additional SIRS criteria met in the tachycardia, tachypnea, leukocytosis.  Source of underlying infection unclear at this time, admitted chest x-ray showing no evidence of acute cardiopulmonary process, including no evidence of infiltrate, urinalysis was inconsistent with UTI.  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  On-call general surgery consulted, recommending admission to hospitalist, with plan for general surgery to evaluate patient formally consult, with interval recommendations for broad-spectrum IV antibiotics.  Blood cultures x2 collected prior to initiation of IV vancomycin, cefepime, and Flagyl.  We will continue these broad-spectrum IV antibiotics given unclear source of underlying infection, including continuation of IV Flagyl given recent abdominal surgery.  Of note, in the absence of any evidence of end organ damage, including a non-elevated presenting LA of 1.2, followed by repeat value of 1.0, pt's sepsis does not meet criteria to be considered severe in nature. Also, in the absence of LA level greater than or equal to 4.0, and in the absence of any associated hypotension refractory to IVF's, there are no indications for administration of a 30 mL/kg IVF bolus at this time.   No e/o additional infectious process at this time, including COVID-19/once PCR negative.    Plan: CBC w/ diff and CMP in AM. Follow for results of blood cx's x 2. Abx: Continue IV vancomycin, cefepime, Flagyl, as above.  Continuous LR.  Add on procalcitonin level.  General surgery consulted, as above.

## 2022-06-06 NOTE — Sepsis Progress Note (Addendum)
Sepsis bundle complete.  Pt admitted, monitoring to be continued by bedside RN

## 2022-06-06 NOTE — Assessment & Plan Note (Signed)
 #)   Generalized weakness: After being discharged home earlier today, the patient reports  generalized weakness, in the absence of any evidence of acute focal neurologic deficits, including no evidence of acute focal weakness.  Suspect contribution from physiologic stress stemming from presenting sepsis of unclear source, including that of objective fever, superimposed on recent physiologic stress from the standpoint partial gastrectomy as well as total splenectomy performed 1 week ago, as above..  Will further eval for any additional contributions from endocrine/metabolic sources, as detailed below.     Plan: work-up and management of presenting sepsis unclear source, as described above. PT consult ordered for the AM. Fall precautions. CMP/CBC in the AM.  Check serum magnesium.

## 2022-06-06 NOTE — H&P (Signed)
History and Physical    PLEASE NOTE THAT DRAGON DICTATION SOFTWARE WAS USED IN THE CONSTRUCTION OF THIS NOTE.   Kevin [name] KDT:[id number] DOB: Jan 22, 1951 DOA: 06/05/2022  PCP: Pcp, No (will further assess) Patient coming from: home   I have personally briefly reviewed patient's old medical records in [name]  Chief Complaint: Fever  HPI: Kevin [name] is a 71 y.o. 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   Patient recent being hospitalized at [facility] from 05/29/2022 to 06/05/2022 in the setting of GIST tumor of stomach, during which he underwent partial gastrectomy as well as total splenectomy on 05/29/2022 via Dr.Shelby Zenia Resides of general surgery.  He was subsequently discharged home on 05/28/2022, and notes that he was only on for a few hours before he developed an objective fever greater than 102.  Denies any associated chills, full body rigors, or generalized myalgias.  Denies any associated headache, neck stiffness, rash, or worsening abdominal discomfort.  Denies any associated chest pain, shortness of breath, cough.  No recent dysuria or gross hematuria.  He also denies any worsening of peripheral edema, nor any recent calf tenderness or new lower extremity erythema.  He conveys associated generalized weakness in the absence of any acute focal weakness or any acute numbness, paresthesias.   Per chart review, the patient has a direct history of chronic anemia associated baseline hemoglobin range of 8-10, with most recent prior hemoglobin data point noted to be 8.7 on 06/01/2022.  Not on any blood thinners as an outpatient.     ED Course:  Vital signs in the ED  were notable for the following: Temperature max 104.2; initial heart rate 111, which subsequently decreased to 85 following interval IV fluids, as further quantified below; blood pressure 99/78, with subsequent increase to 107/60; respiratory rate 2024, oxygen saturation 95 to 100% on room air.  Labs were notable for the following: CMP notable for the following: Bicarbonate 20, creatinine 0.93, liver enzymes within normal limits.  CBC notable for white blood cell count 16,300, hemoglobin 8.3 associated normocytic/normochromic findings.  Initial lactate 1.3, repeat lactic acid level trending down 1.0.  INR 1.2.  Urinalysis notable for showing no white blood cells, no bacteria, leukocyte esterase/nitrate negative.  Cultures x2 were collected prior to initiation of broad-spectrum IV antibiotics.  COVID-19/influenza PCR negative.  Imaging and additional notable ED work-up: Chest x-ray showed no evidence of acute cardiopulmonary process, including evidence of infiltrate, edema, effusion, or pneumothorax.  CT abdomen/will's with contrast reportedly showed postsurgical changes in the setting of recent partial gastrectomy total splenectomy, but otherwise demonstrated no evidence of acute intra-abdominal or acute intrapelvic process.  EDP discussed the patient's case with on-call general surgeon, who recommended admission to the hospital service, for overnight observation and initiation of broad-spectrum IV antibiotics, with plan for general surgery to formally consult..   While in the ED, the following were administered: Acetaminophen 1 g p.o. x1, cefepime, IV vancomycin, IV Flagyl, lactated Ringer's x2.250 liter bolus.   Subsequently, the patient was admitted for overnight observation for further evaluation management of presenting sepsis of unclear source, with presentation also notable for generalized weakness.     Review of Systems: As per HPI otherwise 10 point  review of systems negative.   Past  Medical History:  Diagnosis Date   Anemia    Hypertension    Malignant gastrointestinal stromal tumor (GIST) of stomach ([place]) 06/2021    Past Surgical History:  Procedure Laterality Date   BIOPSY  01/24/2020   Procedure: BIOPSY;  Surgeon: Wonda Horner, MD;  Location: [facility] ENDOSCOPY;  Service: Endoscopy;;   ESOPHAGOGASTRODUODENOSCOPY (EGD) WITH PROPOFOL N/A 01/24/2020   Procedure: ESOPHAGOGASTRODUODENOSCOPY (EGD) WITH PROPOFOL;  Surgeon: Wonda Horner, MD;  Location: [facility] ENDOSCOPY;  Service: Endoscopy;  Laterality: N/A;   IR CATHETER TUBE CHANGE  02/18/2020   IR RADIOLOGIST EVAL & MGMT  02/15/2020   IR RADIOLOGIST EVAL & MGMT  03/09/2020   LAPAROSCOPY N/A 05/29/2022   Procedure: LAPAROSCOPY DIAGNOSTIC;  Surgeon: Dwan Bolt, MD;  Location: [place];  Service: General;  Laterality: N/A;   PARTIAL GASTRECTOMY N/A 05/29/2022   Procedure: OPEN PARTIAL GASTRECTOMY;  Surgeon: Dwan Bolt, MD;  Location: [place];  Service: General;  Laterality: N/A;   SCLEROTHERAPY  01/24/2020   Procedure: Clide Deutscher;  Surgeon: Wonda Horner, MD;  Location: [facility] ENDOSCOPY;  Service: Endoscopy;;   SPLENECTOMY, TOTAL N/A 05/29/2022   Procedure: SPLENECTOMY;  Surgeon: Dwan Bolt, MD;  Location: [place];  Service: General;  Laterality: N/A;    Social History:  reports that he quit smoking about 25 years ago. His smoking use included cigarettes. He has never used smokeless tobacco. He reports that he does not currently use alcohol. He reports that he does not currently use drugs.   No Known Allergies  Family History  Problem Relation Age of Onset   Hypertension Other     Family history reviewed and not pertinent    Prior to Admission medications   Medication Sig Start Date End Date Taking? Authorizing Provider  acetaminophen (TYLENOL) 500 MG tablet Take 1,000 mg by mouth every 6 (six) hours as needed for mild pain.    [provider]  amLODipine (NORVASC) 10 MG tablet TAKE 1 TABLET BY  MOUTH DAILY. Patient taking differently: Take 10 mg by mouth daily. 08/16/21 08/16/22  Ladell Pier, MD  docusate sodium (COLACE) 100 MG capsule Take 1 capsule (100 mg total) by mouth 2 (two) times daily. 06/05/22   Dwan Bolt, MD  imatinib (GLEEVEC) 100 MG tablet TAKE 4 TABLETS (400 MG) BY MOUTH ONCE DAILY. TAKE WITH MEALS AND A FULL GLASS OF WATER. CAUTION: CHEMOTHERAPY Patient taking differently: Take 400 mg by mouth daily. 05/16/22 05/16/23  Ladell Pier, MD  lisinopril (ZESTRIL) 10 MG tablet Take 1 tablet (10 mg total) by mouth daily. 08/16/21   Ladell Pier, MD  methocarbamol (ROBAXIN) 500 MG tablet Take 1 tablet (500 mg total) by mouth every 6 (six) hours as needed for muscle spasms. 06/05/22   Dwan Bolt, MD  ondansetron (ZOFRAN-ODT) 4 MG disintegrating tablet Take 1 tablet (4 mg total) by mouth every 6 (six) hours as needed for nausea. 06/05/22   Dwan Bolt, MD  oxyCODONE (OXY IR/ROXICODONE) 5 MG immediate release tablet Take 1 tablet (5 mg total) by mouth every 6 (six) hours as needed for up to 5 days for severe pain. 06/05/22 06/10/22  Dwan Bolt, MD  potassium chloride SA (KLOR-CON M) 20 MEQ tablet Take 1 tablet (20 mEq total) by mouth daily. Patient not taking: Reported on 05/27/2022 05/13/22   Owens Shark, NP     Objective    Physical Exam: Vitals:  06/06/22 0015 06/06/22 0233 06/06/22 0234 06/06/22 0259  BP:  107/60  132/69  Pulse: 85 83  93  Resp: 20 (!) 21  16  Temp:   98.4 F (36.9 C) 98.5 F (36.9 C)  TempSrc:   Oral Oral  SpO2: 95% 97%  98%  Weight:      Height:        General: appears to be stated age; alert, oriented Skin: warm, dry, no rash Head:  AT/[place] Mouth:  Oral mucosa membranes appear moist, normal dentition Neck: supple; trachea midline Heart:  RRR; did not appreciate any M/R/G Lungs: CTAB, did not appreciate any wheezes, rales, or rhonchi Abdomen: + BS; soft, ND, NT Vascular: 2+ pedal pulses b/l; 2+ radial pulses  b/l Extremities: no peripheral edema, no muscle wasting Neuro: strength and sensation intact in upper and lower extremities b/l     Labs on Admission: I have personally reviewed following labs and imaging studies  CBC: Recent Labs  Lab 05/31/22 0038 06/01/22 0034 06/05/22 2134 06/06/22 0321  WBC 18.2* 15.3* 16.3* 19.0*  NEUTROABS  --   --  13.2* 15.8*  HGB 7.9* 8.7* 8.3* 7.5*  HCT 25.8* 27.3* 25.8* 23.1*  MCV 87.8 85.8 87.2 86.2  PLT 378 457* 812* 903*   Basic Metabolic Panel: Recent Labs  Lab 05/31/22 0038 06/01/22 0034 06/05/22 0611 06/05/22 2134 06/06/22 0321  NA 138 138  --  137 137  K 3.4* 3.2*  --  3.6 3.6  CL 107 104  --  106 104  CO2 25 25  --  20* 22  GLUCOSE 137* 127*  --  169* 128*  BUN 7* 5*  --  18 12  CREATININE 0.65 0.70 0.81 0.93 0.78  CALCIUM 7.7* 7.8*  --  8.1* 8.1*  MG  --   --   --   --  1.8   GFR: Estimated Creatinine Clearance: 72.2 mL/min (by C-G formula based on SCr of 0.78 mg/dL). Liver Function Tests: Recent Labs  Lab 06/05/22 2134 06/06/22 0321  AST 20 16  ALT 16 14  ALKPHOS 77 75  BILITOT 0.6 0.7  PROT 6.7 5.9*  ALBUMIN 2.3* 1.9*   Recent Labs  Lab 06/05/22 2134  LIPASE 18  AMYLASE 42   No results for input(s): "AMMONIA" in the last 168 hours. Coagulation Profile: Recent Labs  Lab 06/05/22 2134  INR 1.2   Cardiac Enzymes: No results for input(s): "CKTOTAL", "CKMB", "CKMBINDEX", "TROPONINI" in the last 168 hours. BNP (last 3 results) No results for input(s): "PROBNP" in the last 8760 hours. HbA1C: No results for input(s): "HGBA1C" in the last 72 hours. CBG: Recent Labs  Lab 06/04/22 1148 06/04/22 1702 06/04/22 2139 06/05/22 0807 06/05/22 1119  GLUCAP 119* 147* 130* 128* 192*   Lipid Profile: No results for input(s): "CHOL", "HDL", "LDLCALC", "TRIG", "CHOLHDL", "LDLDIRECT" in the last 72 hours. Thyroid Function Tests: No results for input(s): "TSH", "T4TOTAL", "FREET4", "T3FREE", "THYROIDAB" in the last  72 hours. Anemia Panel: No results for input(s): "VITAMINB12", "FOLATE", "FERRITIN", "TIBC", "IRON", "RETICCTPCT" in the last 72 hours. Urine analysis:    Component Value Date/Time   COLORURINE YELLOW 06/05/2022 0011   APPEARANCEUR CLEAR 06/05/2022 0011   LABSPEC 1.023 06/05/2022 0011   PHURINE 5.0 06/05/2022 0011   GLUCOSEU NEGATIVE 06/05/2022 0011   HGBUR SMALL (A) 06/05/2022 0011   BILIRUBINUR NEGATIVE 06/05/2022 0011   KETONESUR NEGATIVE 06/05/2022 0011   PROTEINUR NEGATIVE 06/05/2022 0011   NITRITE NEGATIVE 06/05/2022 0011   LEUKOCYTESUR  NEGATIVE 06/05/2022 0011    Radiological Exams on Admission: CT ABDOMEN PELVIS W CONTRAST  Result Date: 06/05/2022 CLINICAL DATA:  Postoperative sepsis with abdominal pain. History of liver abscess. GI stromal tumor. EXAM: CT ABDOMEN AND PELVIS WITH CONTRAST TECHNIQUE: Multidetector CT imaging of the abdomen and pelvis was performed using the standard protocol following bolus administration of intravenous contrast. RADIATION DOSE REDUCTION: This exam was performed according to the departmental dose-optimization program which includes automated exposure control, adjustment of the mA and/or kV according to patient size and/or use of iterative reconstruction technique. CONTRAST:  130m OMNIPAQUE IOHEXOL 300 MG/ML  SOLN COMPARISON:  CT abdomen and pelvis 04/15/2022 FINDINGS: Lower chest: There is a trace left pleural effusion which is new. There is minimal atelectasis in the left lung base. Hepatobiliary: No focal liver abnormality is seen. No gallstones, gallbladder wall thickening, or biliary dilatation. Pancreas: Patient is status post distal pancreatectomy. The head and neck of the pancreas appear within normal limits. There is mild fluid and stranding adjacent to the surgical site without enhancing fluid collection identified. Spleen: Surgically absent. Adrenals/Urinary Tract: There is a cyst in the superior pole the left kidney measuring 2.8 cm. There is  scarring in the right kidney. There is no hydronephrosis or perinephric fluid. The adrenal glands and bladder are within normal limits. Stomach/Bowel: Patient is status post removal of proximal gastric tumor. There is mild stranding surrounding the staple line without fluid collection. The stomach appears nondilated. There is no evidence for bowel obstructio. The appendix is within normal limits. Vascular/Lymphatic: Aorta and IVC are normal in size. There are prominent retroperitoneal lymph nodes similar to the prior examination. There also some prominent gastrohepatic lymph nodes, unchanged. No new enlarged lymph nodes are seen. Reproductive: The prostate gland is enlarged measuring 5.3 by 5.3 cm similar to the prior study. Other: There is a new midline abdominal wall scar. There is a small focus of free air in fluid in the anterior upper abdomen image 4/25 likely related to recent surgery. There is also a tiny focus of free air in the left upper quadrant. No enhancing fluid collections are identified. No abdominal wall hernia. Musculoskeletal: Multilevel degenerative changes affect the spine. IMPRESSION: 1. Patient is status post removal of gastric tumor, distal pancreatectomy, and splenectomy. There is a small amount of stranding and fluid adjacent to the surgical margins without enhancing fluid collection. 2. Small amount of free air in fluid in the upper abdomen is likely postoperative. 3. Trace left pleural effusion. 4. Stable prostatomegaly. Electronically Signed   By: ARonney AstersM.D.   On: 06/05/2022 23:18   DG Chest Port 1 View  Result Date: 06/05/2022 CLINICAL DATA:  Sepsis EXAM: PORTABLE CHEST 1 VIEW COMPARISON:  04/15/2022 FINDINGS: Lung volumes are small and there is bibasilar atelectasis. No pneumothorax or pleural effusion. Cardiac size within normal limits. Pulmonary vascularity is normal. No acute bone abnormality. IMPRESSION: Pulmonary hypoinflation. Electronically Signed   By: AFidela SalisburyM.D.   On: 06/05/2022 21:36       Assessment/Plan   Principal Problem:   Sepsis ([place] Active Problems:   Essential hypertension   Generalized weakness   Chronic anemia        #) Sepsis of unclear source: Patient presents with objective fever, with temperature max of 104.2 in the ED, with additional SIRS criteria met in the tachycardia, tachypnea, leukocytosis.  Source of underlying infection unclear at this time, admitted chest x-ray showing no evidence of acute cardiopulmonary process, including no evidence  of infiltrate, urinalysis was inconsistent with UTI.  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  On-call general surgery consulted, recommending admission to hospitalist, with plan for general surgery to evaluate patient formally consult, with interval recommendations for broad-spectrum IV antibiotics.  Blood cultures x2 collected prior to initiation of IV vancomycin, cefepime, and Flagyl.  We will continue these broad-spectrum IV antibiotics given unclear source of underlying infection, including continuation of IV Flagyl given recent abdominal surgery.  Of note, in the absence of any evidence of end organ damage, including a non-elevated presenting LA of 1.2, followed by repeat value of 1.0, pt's sepsis does not meet criteria to be considered severe in nature. Also, in the absence of LA level greater than or equal to 4.0, and in the absence of any associated hypotension refractory to IVF's, there are no indications for administration of a 30 mL/kg IVF bolus at this time.   No e/o additional infectious process at this time, including COVID-19/once PCR negative.    Plan: CBC w/ diff and CMP in AM.  Follow for results of blood cx's x 2. Abx: Continue IV vancomycin, cefepime, Flagyl, as above.  Continuous LR.  Add on procalcitonin level.  General surgery consulted, as above.           #) Generalized weakness: After being discharged home earlier today, the patient reports  generalized weakness, in the absence of any evidence of acute focal neurologic deficits, including no evidence of acute focal weakness.  Suspect contribution from physiologic stress stemming from presenting sepsis of unclear source, including that of objective fever, superimposed on recent physiologic stress from the standpoint partial gastrectomy as well as total splenectomy performed 1 week ago, as above..  Will further eval for any additional contributions from endocrine/metabolic sources, as detailed below.     Plan: work-up and management of presenting sepsis unclear source, as described above. PT consult ordered for the AM. Fall precautions. CMP/CBC in the AM.  Check serum magnesium.               #) Essential Hypertension: documented h/o such, with outpatient antihypertensive regimen including lisinopril and amlodipine.  SBP's in the ED today: Borderline low, with initial systolic blood pressures in the 90s, subsequent proving into the low 100s following interval IV fluids, as further quantified above.   Plan: Close monitoring of subsequent BP via routine VS. in the setting of presenting sepsis, and soft initial blood pressures, will hold home antihypertensive medications for now.           #) Chronic normocytic anemia: Documented history of such, associated baseline hemoglobin range of 8-10, with presenting hemoglobin of 8.3 consistent with this range in similar to most recent prior value 8.7 on 06/01/2022, without any overt evidence of active bleed at this time.  INR noted to be 1.2.  Presenting hemoglobin associated with normocytic/normochromic findings.  Plan: Repeat CBC in the morning.          DVT prophylaxis: SCD's   Code Status: Full code Family Communication: none Disposition Plan: Per Rounding Team Consults called: On-call general surgery consulted, as further detailed above;  Admission status: Observation; pcu    PLEASE NOTE THAT DRAGON DICTATION SOFTWARE WAS USED IN THE CONSTRUCTION OF THIS NOTE.   [place]  [facility]  From [place]   06/06/2022, 7:00 AM

## 2022-06-07 ENCOUNTER — Telehealth: Payer: Self-pay

## 2022-06-07 LAB — CBC
HCT: 23.5 % — ABNORMAL LOW (ref 39.0–52.0)
Hemoglobin: 7.3 g/dL — ABNORMAL LOW (ref 13.0–17.0)
MCH: 27.2 pg (ref 26.0–34.0)
MCHC: 31.1 g/dL (ref 30.0–36.0)
MCV: 87.7 fL (ref 80.0–100.0)
Platelets: 814 10*3/uL — ABNORMAL HIGH (ref 150–400)
RBC: 2.68 MIL/uL — ABNORMAL LOW (ref 4.22–5.81)
RDW: 16.7 % — ABNORMAL HIGH (ref 11.5–15.5)
WBC: 14.5 10*3/uL — ABNORMAL HIGH (ref 4.0–10.5)
nRBC: 0 % (ref 0.0–0.2)

## 2022-06-07 LAB — URINE CULTURE

## 2022-06-07 NOTE — Discharge Summary (Signed)
Physician Discharge Summary   Patient ID: Kevin [name] [id number] 71 y.o. 1951-07-02  Admit date: 06/05/2022  Discharge date and time: 06/07/22  Admitting Physician: Rhetta Mura, DO   Discharge Physician: Michaelle Birks, MD  Admission Diagnoses: Fever in adult [R50.9] Sepsis [facility]) [A41.9]; GIST  Discharge Diagnoses: Fever; gastrointestinal stromal tumor  Admission Condition: fair  Discharged Condition: good  Indication for Admission: Kevin [name] is a 71 yo male with a high-risk gastric GIST, for which he recently underwent resection via a partial gastrectomy, distal pancreatectomy and splenectomy on 05/29/22. His postop course was unremarkable and he was discharged home on POD7. He was given all splenectomy vaccines on the day of discharge (Menveo, Bexsero, Prevnar, and HiB). Shortly after getting home, he began having subjective chills and fever. He presented to the ED later that night and was found to be febrile to 40. He was started on IV antibiotics per sepsis protocol and admitted.  Hospital Course: The patient was admitted and given a dose of IV antibiotics, and a full fever workup was initiated including CXR, UA, blood cultures, and CT scan. CT abd/pelvis showed expected postoperative changes but no intraabdominal fluid collections, large-volume free, or signs of pancreatitis. He had no signs of wound infection on exam and no abdominal pain. Serum amylase and lipase were normal. UA was negative and CXR was not consistent with pnemonia. Antibiotics were discontinued the morning of 6/29 as no source of fever was identified. The patient remained afebrile and hemodynamically stable for the next 24 hours. On the morning of 6/30, urine culture grew multiple species and recollection was recommended. The patient denied symptoms of dysuria and UA was negative, so further urine cultures were felt to be unnecessary, and findings on culture were likely contaminant. Blood cultures sent the  evening of admission and remained with no growth on 6/30. On 6/30 the patient had been afebrile for >24 hours, was tolerating a diet, and had no acute complaints or infectious symptoms. It was felt that his fevers may have been an immune response to the vaccines he had received earlier that day. He was examined and deemed appropriate for discharge home. Final cultures will be follow up.  Consults:  general medicine  Significant Diagnostic Studies: microbiology: blood culture: No growth at 2 days  and radiology: CXR: atelectasis bilaterally and CT scan: Small amount of stranding adjacent to surgical margins, no fluid collections. Trace left pleural effusion. Small amount of free air, postoperative.  Treatments: IV hydration and antibiotics: vancomycin, metronidazole, and cefepime  Discharge Exam: General: resting comfortably, NAD Neuro: alert and oriented, no focal deficits Resp: normal work of breathing on room air Abdomen: soft, nondistended, nontender to palpation. Incisions clean and dry with no erythema or induration. Extremities: warm and well-perfused   Disposition: Discharge disposition: 01-Home or Self Care       Patient Instructions:  Allergies as of 06/07/2022   No Known Allergies      Medication List     TAKE these medications    acetaminophen 500 MG tablet Commonly known as: TYLENOL Take 1,000 mg by mouth every 6 (six) hours as needed for mild pain.   amLODipine 10 MG tablet Commonly known as: NORVASC Tome 1 tableta por va oral diariamente. (TAKE 1 TABLET BY MOUTH DAILY.) What changed: how much to take   docusate sodium 100 MG capsule Commonly known as: COLACE Take 1 capsule (100 mg total) by mouth 2 (two) times daily.   imatinib 100 MG tablet Commonly known  as: GLEEVEC TAKE 4 TABLETS (400 MG) BY MOUTH ONCE DAILY. TAKE WITH MEALS AND A FULL GLASS OF WATER. CAUTION: CHEMOTHERAPY What changed:  how much to take how to take this when to take this    lisinopril 10 MG tablet Commonly known as: ZESTRIL Tome 1 tableta (10 mg en total) por va oral diariamente. (Take 1 tablet (10 mg total) by mouth daily.)   methocarbamol 500 MG tablet Commonly known as: ROBAXIN Take 1 tablet (500 mg total) by mouth every 6 (six) hours as needed for muscle spasms.   ondansetron 4 MG disintegrating tablet Commonly known as: ZOFRAN-ODT Take 1 tablet (4 mg total) by mouth every 6 (six) hours as needed for nausea.   oxyCODONE 5 MG immediate release tablet Commonly known as: Oxy IR/ROXICODONE Take 1 tablet (5 mg total) by mouth every 6 (six) hours as needed for up to 5 days for severe pain.   potassium chloride SA 20 MEQ tablet Commonly known as: KLOR-CON M Tome una tableta (20 mEq en total) por va oral diariamente. (Take 1 tablet (20 mEq total) by mouth daily.)       Activity: no driving while on analgesics and no heavy lifting for 5 weeks Diet:  Soft diet Wound Care: keep wound clean and dry  Follow-up with Dr. Zenia Resides on 06/25/22. Follow up with Dr. Benay Spice on 06/28/22.  Signed: Dwan Bolt 06/07/2022 1:59 PM

## 2022-06-07 NOTE — Discharge Instructions (Signed)
[facility] DISCHARGE INSTRUCTIONS  Activity No heavy lifting greater than 10 pounds for 6 weeks after surgery. Ok to shower, but do not bathe or submerge incision underwater. Do not drive while taking narcotic pain medication. Be sure to walk around your home at least 3 times daily. This will help avoid blood clots and will improve your strength.  Wound Care Your incision is covered with skin glue called Dermabond. This will peel off on its own over time. You may shower and allow warm soapy water to run over your incisions. Gently pat dry. Do not submerge your incision underwater. Monitor your incision for any new redness, tenderness, or drainage.  Medications You may take Tylenol up to 4 times daily for pain. Do NOT take more than 4000 mg Tylenol in a single 24-hour period. If you have more severe pain that is not covered by Tylenol, you may take oxycodone up to every 6 hours as needed. Please reserve this medication only for the most severe pain.  Diet You may notice that you feel full early after meals, or have occasional nausea or decreased appetite. This is normal following surgery on your stomach and will improve with time. If you have these symptoms, it is better to consume small frequent meals throughout the day rather than 3 large meals. You should consume soft foods that are easy to digest. Avoid drinking a lot of carbonated beverages, as these can worsen symptoms of nausea and reflux. If you are having frequent vomiting after meals or are unable to keep down any food or drink, please call the office right away.  When to Call us: Fever greater than 100.5 New redness, drainage, or swelling at incision site Severe pain, nausea, or vomiting Excessive vomiting or inability to keep down any liquids Jaundice (yellowing of the whites of the eyes or skin)  Follow-up You have an appointment scheduled with Dr. Zenia Resides on June 25, 2022 at 9:40am. This will be at the [facility] Surgery office at [address], [place], [place], [place]. Please arrive at least 15 minutes prior to your scheduled appointment time.  For questions or concerns, please call the office at [phone].

## 2022-06-07 NOTE — Plan of Care (Signed)
  Problem: Pain Managment: Goal: General experience of comfort will improve Outcome: Progressing   Problem: Safety: Goal: Ability to remain free from injury will improve Outcome: Progressing   Problem: Skin Integrity: Goal: Risk for impaired skin integrity will decrease Outcome: Progressing   

## 2022-06-07 NOTE — Progress Notes (Signed)
AVS given and reviewed with patient and family via Spero Geralds, in person interpreter.  Medications discussed and reviewed.  All questions answered to satisfaction.  Patient verbalized understanding of discharge information. Patient escorted off of the unit via wheelchair by  volunteer services.

## 2022-06-07 NOTE — Telephone Encounter (Signed)
TC from Pt's wife inquiring about Pt continuing [place]. Left message on wife's voicemail stating per Dr Benay Spice Pt is to resume taking Gleevec if all wounds are healed. Informed Pt's wife if any problems or concerns noted to give a return call to the office.

## 2022-06-10 ENCOUNTER — Telehealth: Payer: Self-pay | Admitting: *Deleted

## 2022-06-10 LAB — CULTURE, BLOOD (ROUTINE X 2)
Culture: NO GROWTH
Culture: NO GROWTH

## 2022-06-10 NOTE — Telephone Encounter (Signed)
Attempt to reach wife to f/u on status since discharge. Left VM to return call.

## 2022-06-18 ENCOUNTER — Other Ambulatory Visit (HOSPITAL_COMMUNITY): Payer: Self-pay

## 2022-06-18 ENCOUNTER — Telehealth: Payer: Self-pay | Admitting: *Deleted

## 2022-06-18 NOTE — Telephone Encounter (Signed)
Spoke w/daughter after notified by pharmacy he has not been taking his [place]. She reports he would have nausea and vomit after taking med, even with eating before and Zofran. Inquired when did he take last dose and she reported "before surgery". Requested she tell him that Dr. Benay Spice said it was most likely the tumor that made him since and not the [place]. Please ask him to try again. She says he is afraid he will vomit and "pop his incision". Informed her this is not likely. She will try to get him to take it at least once as a test. Was not able to reach wife. Daughter reports she has a new phone # and was not able to give it to this RN now

## 2022-06-20 ENCOUNTER — Other Ambulatory Visit (HOSPITAL_COMMUNITY): Payer: Self-pay

## 2022-06-21 ENCOUNTER — Other Ambulatory Visit (HOSPITAL_COMMUNITY): Payer: Self-pay

## 2022-06-28 ENCOUNTER — Inpatient Hospital Stay: Payer: Medicare Other | Attending: Oncology

## 2022-06-28 ENCOUNTER — Inpatient Hospital Stay: Payer: Medicare Other | Admitting: Oncology

## 2022-07-01 ENCOUNTER — Telehealth: Payer: Self-pay | Admitting: Oncology

## 2022-07-01 ENCOUNTER — Telehealth: Payer: Self-pay | Admitting: *Deleted

## 2022-07-01 NOTE — Telephone Encounter (Signed)
Called daughter to attempt to reschedule his missed lab/OV on 06/28/22. She is unable to bring him unless the appointment is after 3 pm. Patient's wife has new phone number and daughter reports she has not put it in her contact list yet, so she is not able to provide it to this nurse. She will have his wife call office tomorrow to schedule.  She is requesting he be added to the [facility] transportation service. Confirmed he is living at address on file. Will make scheduler aware of request.

## 2022-07-01 NOTE — Telephone Encounter (Signed)
Attempted to contact patient daughter in regards to rescheduling no show appointments. And see about setting up transportation for patient due to family claiming not being able to bring him to appointments.

## 2022-07-18 ENCOUNTER — Telehealth: Payer: Self-pay | Admitting: Oncology

## 2022-07-18 NOTE — Telephone Encounter (Signed)
Attempted to contact patient and daughter and regards to seeing about rescheduling lab and office visit. No answer and voicemail is full so unable to leave message

## 2022-07-22 ENCOUNTER — Other Ambulatory Visit: Payer: Self-pay | Admitting: Surgery

## 2022-07-23 ENCOUNTER — Telehealth: Payer: Self-pay | Admitting: Oncology

## 2022-07-23 NOTE — Telephone Encounter (Signed)
Attempted to contact patient's daughter in regards to scheduling a lab, office and injection visit. No answer and voicemail is full so unable to leave message.

## 2022-07-25 ENCOUNTER — Other Ambulatory Visit: Payer: Self-pay | Admitting: *Deleted

## 2022-07-25 DIAGNOSIS — Z23 Encounter for immunization: Secondary | ICD-10-CM

## 2022-07-25 NOTE — Progress Notes (Signed)
Vaccine orders placed for 08/01/22 visit.

## 2022-07-26 ENCOUNTER — Other Ambulatory Visit: Payer: Self-pay | Admitting: *Deleted

## 2022-07-26 DIAGNOSIS — Z23 Encounter for immunization: Secondary | ICD-10-CM

## 2022-07-29 ENCOUNTER — Other Ambulatory Visit (HOSPITAL_COMMUNITY): Payer: Self-pay

## 2022-08-01 ENCOUNTER — Inpatient Hospital Stay: Payer: Medicare Other | Attending: Oncology

## 2022-08-01 ENCOUNTER — Inpatient Hospital Stay: Payer: Medicare Other

## 2022-08-01 ENCOUNTER — Inpatient Hospital Stay (HOSPITAL_BASED_OUTPATIENT_CLINIC_OR_DEPARTMENT_OTHER): Payer: Medicare Other | Admitting: Nurse Practitioner

## 2022-08-01 ENCOUNTER — Encounter: Payer: Self-pay | Admitting: Nurse Practitioner

## 2022-08-01 VITALS — BP 160/90 | HR 92 | Temp 98.1°F | Resp 18 | Ht 62.0 in | Wt 148.4 lb

## 2022-08-01 DIAGNOSIS — C49A2 Gastrointestinal stromal tumor of stomach: Secondary | ICD-10-CM | POA: Insufficient documentation

## 2022-08-01 DIAGNOSIS — Z79899 Other long term (current) drug therapy: Secondary | ICD-10-CM | POA: Diagnosis not present

## 2022-08-01 DIAGNOSIS — B9681 Helicobacter pylori [H. pylori] as the cause of diseases classified elsewhere: Secondary | ICD-10-CM | POA: Diagnosis not present

## 2022-08-01 DIAGNOSIS — K2970 Gastritis, unspecified, without bleeding: Secondary | ICD-10-CM | POA: Diagnosis not present

## 2022-08-01 DIAGNOSIS — D649 Anemia, unspecified: Secondary | ICD-10-CM | POA: Insufficient documentation

## 2022-08-01 DIAGNOSIS — Z23 Encounter for immunization: Secondary | ICD-10-CM

## 2022-08-01 LAB — CBC WITH DIFFERENTIAL (CANCER CENTER ONLY)
Abs Immature Granulocytes: 0.02 10*3/uL (ref 0.00–0.07)
Basophils Absolute: 0 10*3/uL (ref 0.0–0.1)
Basophils Relative: 0 %
Eosinophils Absolute: 0.1 10*3/uL (ref 0.0–0.5)
Eosinophils Relative: 1 %
HCT: 38.9 % — ABNORMAL LOW (ref 39.0–52.0)
Hemoglobin: 12.5 g/dL — ABNORMAL LOW (ref 13.0–17.0)
Immature Granulocytes: 0 %
Lymphocytes Relative: 33 %
Lymphs Abs: 2.5 10*3/uL (ref 0.7–4.0)
MCH: 27.7 pg (ref 26.0–34.0)
MCHC: 32.1 g/dL (ref 30.0–36.0)
MCV: 86.3 fL (ref 80.0–100.0)
Monocytes Absolute: 1.3 10*3/uL — ABNORMAL HIGH (ref 0.1–1.0)
Monocytes Relative: 17 %
Neutro Abs: 3.6 10*3/uL (ref 1.7–7.7)
Neutrophils Relative %: 49 %
Platelet Count: 538 10*3/uL — ABNORMAL HIGH (ref 150–400)
RBC: 4.51 MIL/uL (ref 4.22–5.81)
RDW: 17.2 % — ABNORMAL HIGH (ref 11.5–15.5)
WBC Count: 7.5 10*3/uL (ref 4.0–10.5)
nRBC: 0 % (ref 0.0–0.2)

## 2022-08-01 LAB — CMP (CANCER CENTER ONLY)
ALT: 16 U/L (ref 0–44)
AST: 17 U/L (ref 15–41)
Albumin: 4.1 g/dL (ref 3.5–5.0)
Alkaline Phosphatase: 93 U/L (ref 38–126)
Anion gap: 10 (ref 5–15)
BUN: 12 mg/dL (ref 8–23)
CO2: 23 mmol/L (ref 22–32)
Calcium: 9.3 mg/dL (ref 8.9–10.3)
Chloride: 98 mmol/L (ref 98–111)
Creatinine: 0.79 mg/dL (ref 0.61–1.24)
GFR, Estimated: >60 mL/min (ref >60)
Glucose, Bld: 227 mg/dL — ABNORMAL HIGH (ref 70–99)
Potassium: 3.1 mmol/L — ABNORMAL LOW (ref 3.5–5.1)
Sodium: 131 mmol/L — ABNORMAL LOW (ref 135–145)
Total Bilirubin: 0.4 mg/dL (ref 0.3–1.2)
Total Protein: 8.1 g/dL (ref 6.5–8.1)

## 2022-08-01 MED ORDER — MENINGOCOCCAL A C Y&W-135 OLIG IM SOLR
0.5000 mL | Freq: Once | INTRAMUSCULAR | Status: AC
  Administered 2022-08-01: 0.5 mL via INTRAMUSCULAR
  Filled 2022-08-01: qty 0.5

## 2022-08-01 MED ORDER — MENINGOCOCCAL VAC B (OMV) IM SUSY
0.5000 mL | PREFILLED_SYRINGE | Freq: Once | INTRAMUSCULAR | Status: AC
  Administered 2022-08-01: 0.5 mL via INTRAMUSCULAR
  Filled 2022-08-01: qty 0.5

## 2022-08-01 NOTE — Progress Notes (Signed)
[facility] OFFICE PROGRESS NOTE   Diagnosis: Gastrointestinal stromal tumor  INTERVAL HISTORY:   [name] returns for follow-up.  He underwent resection of the gastric mass, partial gastrectomy, distal pancreatectomy and splenectomy by Dr. Zenia Resides 05/29/2022.  Pathology showed gastrointestinal stromal tumor with extensive necrosis measuring 4.7 x 13.2 x 8.1 cm; tumor arises within the stomach ulcerating mucosa and abuts the spleen and pancreas; mitotic rate 49; necrosis present comprising approximately 95% of the tumor; treatment effect present; all margins negative for tumor; 0 of 13 lymph nodes involved; risk assessment is high risk; pT4, pN0.  He reports he resumed [place] following surgery.  He denies nausea/vomiting.  No diarrhea.  No rash.  He denies abdominal pain.  He describes his appetite as "above normal".  Objective:  Vital signs in last 24 hours:  Blood pressure (!) 160/90, pulse 92, temperature 98.1 F (36.7 C), temperature source Oral, resp. rate 18, height '5\' 2"'$  (1.575 m), weight 148 lb 6.4 oz (67.3 kg), SpO2 100 %.    HEENT: No thrush or ulcers. Resp: Lungs clear bilaterally. Cardio: Regular rate and rhythm. GI: Abdomen soft and nontender.  Healed midline incision.  No hepatomegaly.  No mass. Vascular: No leg edema. Skin: No rash.   Lab Results:  Lab Results  Component Value Date   WBC 7.5 08/01/2022   HGB 12.5 (L) 08/01/2022   HCT 38.9 (L) 08/01/2022   MCV 86.3 08/01/2022   PLT 538 (H) 08/01/2022   NEUTROABS 3.6 08/01/2022    Imaging:  No results found.  Medications: I have reviewed the patient's current medications.  Assessment/Plan: GIST of the stomach CT abdomen/pelvis 01/21/2020-large necrotic mass centered in the posterior wall of the stomach, irregular hypodense mass right lobe liver  Upper endoscopy 01/24/2020-large infiltrative mass with no bleeding found in the posterior wall of the stomach.  Biopsy-gastrointestinal stromal  tumor  MRI liver 01/28/2020-large exophytic centrally necrotic 17.3 x 12.3 cm gastric mass arising from posterior proximal stomach centered in the left upper quadrant with thick enhancing wall.  Complex multilocular 10.7 x 8.2 cm cystic liver mass right liver lobe.   Neoadjuvant Gleevec 01/28/2020 CT 03/03/2020, resolution of fluid component of right hepatic abscess, unchanged mass posterior to the gastric body, unchanged left periaortic and aortocaval adenopathy CT 06/01/2020-decreased size of a multilobulated mass arising from the posterior stomach, unchanged retroperitoneal lymph nodes, removal of liver drain resolution of abscess CT 09/14/20-no significant change in the large left upper quadrant mass arising from the proximal stomach.  Near complete resolution of right hepatic lobe abscess. CT 06/06/2021-no change in mass arising from the posterior gastric fundus, no lymphadenopathy or evidence of metastatic disease Patient discontinued Gleevec around September 2022 per his report CT 12/19/2021-significant interval increase in size of the large gastric mass which is partially necrotic, measures 16.5 x 14 cm compared to previous measurement of 9 x 7.6 cm.  Lumen of the stomach is significantly compressed.  Also mass-effect on the pancreas, spleen, left kidney and left adrenal gland.  No definite findings for direct invasion of these organs.  No findings of hepatic metastatic disease.  Stable small scattered mesenteric and retroperitoneal lymph nodes. 12/25/2021 Gleevec 400 mg daily CT abdomen/pelvis 03/15/2022-interval 67% reduction in volume of the large left upper quadrant GI stromal tumor, scattered small retroperitoneal lymphadenopathy similar to prior CT abdomen/pelvis 04/15/2022-large exophytic mass arising off the posterior wall of the stomach noted and compared to prior exam there has been a mild decrease in tumor volume, signs of  internal necrosis, new gas identified within the dominant chronic  component. 05/29/2022 resection of the gastric mass, partial gastrectomy, distal pancreatectomy and splenectomy by Dr. Zenia Resides. Pathology showed gastrointestinal stromal tumor with extensive necrosis measuring 4.7 x 13.2 x 8.1 cm; tumor arises within the stomach ulcerating mucosa and abuts the spleen and pancreas; mitotic rate 49; necrosis present comprising approximately 95% of the tumor; treatment effect present; all margins negative for tumor; 0 of 13 lymph nodes involved; risk assessment is high risk; pT4, pN0 Gleevec resumed 400 mg daily following surgery 2. Liver lesion in the right lobe             -Liver mass consistent with abscess on MRI of the liver from 01/27/2020             -Status post CT-guided drainage of the liver abscess on 01/28/2020-culture positive for Streptococcus intermedius             -IV antibiotics and discharged home on 10-day course of Augmentin             -CT 03/03/2020-clearance of fluid component of liver abscess, unchanged mass posterior to the gastric body, stable left periaortic and aortocaval adenopathy             -Abscess drain removed 03/09/2020             -Liver abscess resolved on CT 06/01/2020 and 09/14/2020   3. Iron deficiency anemia-oral iron,  resolved Anemia on hospital admission 04/15/2022-likely secondary to sepsis and phlebotomy 4. Gastritis and H. pylori infection 5.  History of fever secondary to the liver abscess 6. Protein calorie malnutrition-resolved 7.  Hospital admission 04/15/2022-sepsis, blood culture positive for Staph epidermidis    Disposition: [name] appears well.  He resumed Gleevec following surgery and seems to be tolerating well.  Plan to continue the same.  CBC reviewed.  Counts adequate to continue Gleevec as he is taking.  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  He will return for lab and follow-up in 6 weeks.  He will contact the office in the interim with any problems.    Ned Card ANP/GNP-BC    08/01/2022  2:07 PM

## 2022-08-01 NOTE — Patient Instructions (Addendum)
Vacuna antimeningoccica B: lo que debe saber Meningococcal B Vaccine: What You Need to Know 1. Por qu vacunarse? La vacuna antimeningoccica B puede ayudar a proteger contra la enfermedad meningoccica causada por el serogrupo B. Augusto Gamble antimeningoccica diferente se encuentra disponible para ayudar a proteger contra los serogrupos A, C, W e Y. La enfermedad meningoccica puede provocar meningitis (infeccin de las membranas que recubren el cerebro y la mdula espinal) e infecciones en la sangre. A pesar de recibir tratamiento, la enfermedad meningoccica mata de 10 a 15 personas cada 100 infectadas. [employer] sobrevivientes, de 10 a 20 cada 100 sufrirn discapacidades, como prdida [place], [place] cerebral, dao renal, prdida de extremidades, problemas en el sistema nervioso o cicatrices muy marcadas por los injertos de piel. La enfermedad meningoccica es poco frecuente y ha disminuido en los [place] desde la dcada de 1990. Sin embargo, es una enfermedad grave con un riesgo significativo de muerte o discapacidades duraderas en las personas que la padecen. Cualquier persona puede Teaching laboratory technician. Algunas personas corren un mayor riesgo, entre ellas: Bebs de menos de un ao Adolescentes y adultos jvenes de 16 a 23 aos Personas con determinadas afecciones mdicas que afectan el sistema inmunitario Microbilogos que trabajan frecuentemente con cepas de N. meningitidis, las bacterias que causan la enfermedad meningoccica Personas en riesgo debido a un brote en su comunidad 2. [place] antimeningoccica B Para tener una mejor proteccin, se necesita ms de 1 dosis de la vacuna antimeningoccica B. [place] Puede utilizarse la misma vacuna para todas las dosis. Se recomienda la aplicacin de las vacunas antimeningoccicas B a las personas de 10 aos en adelante que corren ms riesgo de Museum/gallery curator la enfermedad meningoccica del  serogrupo B, entre quienes se incluyen: Personas en riesgo debido a un brote de la enfermedad meningoccica del serogrupo B Cualquier persona que tenga un deterioro del bazo o a quien se Water quality scientist extirpado, incluidas las personas con anemia Engineer, petroleum persona que tenga un trastorno poco frecuente del sistema inmunitario llamado "deficiencia de componentes del complemento" Cualquier persona que tome un tipo de frmaco llamado "inhibidor del complemento", como eculizumab (tambin llamado "Soliris") o ravulizumab (tambin llamado "Ultomiris") Microbilogos que trabajan frecuentemente con cepas de N. meningitidis Estas vacunas tambin pueden administrarse a Insurance claims handler de 16 a 23 aos de edad para proporcionar proteccin a corto plazo contra la mayora de las cepas de la enfermedad meningoccica del serogrupo B, tras una conversacin para tomar la decisin entre el paciente y el mdico. La edad preferida para la vacunacin es de 16 a 18 aos. 3. Hable con el mdico Comunquese con la persona que le coloca las vacunas si la persona que la recibe: Ha tenido una reaccin alrgica despus de Ardelia Mems dosis previa de la vacuna antimeningoccica B o tiene alguna alergia grave, potencialmente mortal Est embarazada o amamantando En algunos casos, es posible que el mdico decida posponer la aplicacin de la vacuna antimeningoccica B para una visita en el futuro. La vacunacin antimeningoccica B debe posponerse para las personas embarazadas a menos que la persona corra un riesgo mayor al esperado y, tras Teacher, adult education a su mdico, se considere que los beneficios de la vacunacin compensan los posibles riesgos. Las personas que sufren trastornos menores, como un resfro, pueden vacunarse. Las personas que tienen enfermedades moderadas o graves generalmente deben esperar hasta recuperarse para poder recibir la vacuna antimeningoccica B. Su mdico puede darle ms informacin. 4. [place] de [place] reaccin  a la  vacuna [place] de recibir Soil scientist B, puede Chemical engineer, enrojecimiento o Estate agent donde se aplic la inyeccin, cansancio, dolor de cabeza, dolor muscular o articular, fiebre o nuseas. Algunas de estas reacciones se producen en ms de la mitad de las personas que reciben la vacuna. Las personas a veces se desmayan despus de procedimientos mdicos, incluida la vacunacin. Informe al mdico si se siente mareado, tiene cambios en la visin o zumbidos en los odos. Al igual que con cualquier [employer], existe una probabilidad muy remota de que una vacuna cause una reaccin alrgica grave, otra lesin grave o la muerte. 5. Qu pasa si se presenta un problema grave? Podra producirse una reaccin alrgica despus de que la persona vacunada abandone la clnica. Si observa signos de Nurse, mental health grave (ronchas, hinchazn de la cara y la garganta, dificultad para respirar, latidos cardacos acelerados, mareos o debilidad), llame al 9-1-1 y lleve a la persona al hospital ms cercano. Si se presentan otros signos que le preocupan, comunquese con su mdico. Las reacciones adversas deben informarse al Sistema de Informe de Eventos Adversos de Clinical biochemist (VAERS). Por lo general, el mdico presenta este informe o puede hacerlo usted mismo. Visite el sitio web del VAERS en www.vaers.SamedayNews.es o llame al 6032180497. El VAERS es solo para Electrical engineer, y los miembros de su personal no proporcionan asesoramiento mdico. 6. [place] de Compensacin de Daos por [place] de Compensacin de Daos por Clinical biochemist (National Vaccine Injury Fiserv, Runner, broadcasting/film/video) es un programa federal que fue creado para Patent examiner a las personas que puedan haber sufrido daos al recibir ciertas vacunas. Las Artist a presuntas lesiones o muerte debidas a la vacunacin tienen un lmite de tiempo para su presentacin, que puede ser de tan solo [employer]. Visite el sitio web del VICP en GoldCloset.com.ee o llame al 1-762 217 9337 para obtener ms informacin acerca del programa y de cmo presentar un reclamo. 7. Cmo puedo obtener ms informacin? Pregntele a su mdico. Comunquese con el servicio de salud de su localidad o su estado. Visite el sitio web de Environmental manager) (Administracin de Alimentos y Chief Strategy Officer) para ver los prospectos de las vacunas e informacin adicional en TraderRating.uy. Comunquese con Garment/textile technologist for [employer]) (Centros para el Control y la Prevencin de [place]): Llame al (260)410-0309 (1-800-CDC-INFO) o Visite el sitio web de [employer] en http://hunter.com/. Fuente: Declaracin de informacin de los CDC sobre la vacuna antimeningoccica B (07/14/2020) Este mismo material est disponible en http://www.wolf.info/ sin cargo. Esta informacin no tiene Marine scientist el consejo del mdico. Asegrese de hacerle al mdico cualquier pregunta que tenga. Document Revised: 11/13/2021 Document Reviewed: 08/16/2021 Elsevier Patient Education  [place]. Meningococcal B Vaccine: What You Need to Know 1. Why get vaccinated? Meningococcal B vaccine can help protect against meningococcal disease caused by serogroup B. A different meningococcal vaccine is available that can help protect against serogroups A, C, W, and Y. Meningococcal disease can cause meningitis (infection of the lining of the brain and spinal cord) and infections of the blood. Even when it is treated, meningococcal disease kills 10 to 15 infected people out of 100. And of those who survive, about 10 to 20 out of every 100 will suffer disabilities such as hearing loss, brain damage, kidney damage, loss of limbs, nervous system problems, or severe scars from skin grafts. Meningococcal disease is rare and has declined in the [place] since the  1990s. However,  it is a severe disease with a significant risk of death or lasting disabilities in people who get it. Anyone can get meningococcal disease. Certain people are at increased risk, including: Infants younger than one year old Adolescents and young adults 20 through 70 years old People with certain medical conditions that affect the immune system Microbiologists who routinely work with isolates of N. meningitidis, the bacteria that cause meningococcal disease People at risk because of an outbreak in their community 2. Meningococcal B vaccine For best protection, more than 1 dose of a meningococcal B vaccine is needed. There are two meningococcal B vaccines available. The same vaccine must be used for all doses. Meningococcal B vaccines are recommended for people 10 years or older who are at increased risk for serogroup B meningococcal disease, including: People at risk because of a serogroup B meningococcal disease outbreak Anyone whose spleen is damaged or has been removed, including people with sickle cell disease Anyone with a rare immune system condition called "complement component deficiency" Anyone taking a type of drug called a "complement inhibitor," such as eculizumab (also called "Soliris") or ravulizumab (also called "Ultomiris") Microbiologists who routinely work with isolates of N. meningitidis These vaccines may also be given to anyone 52 through 70 years old to provide short-term protection against most strains of serogroup B meningococcal disease, based on discussions between the patient and health care provider. The preferred age for vaccination is 71 through 18 years. 3. Talk with your health care provider Tell your vaccination provider if the person getting the vaccine: Has had an allergic reaction after a previous dose of meningococcal B vaccine, or has any severe, life-threatening allergies Is pregnant or breastfeeding In some cases, your health care provider may decide to  postpone meningococcal B vaccination until a future visit. Meningococcal B vaccination should be postponed for pregnant people unless the person is at increased risk and, after consultation with their health care provider, the benefits of vaccination are considered to outweigh the potential risks. People with minor illnesses, such as a cold, may be vaccinated. People who are moderately or severely ill should usually wait until they recover before getting meningococcal B vaccine. Your health care provider can give you more information. 4. Risks of a vaccine reaction Soreness, redness, or swelling where the shot is given, tiredness, headache, muscle or joint pain, fever, or nausea can happen after meningococcal B vaccination. Some of these reactions occur in more than half of the people who receive the vaccine. People sometimes faint after medical procedures, including vaccination. Tell your provider if you feel dizzy or have vision changes or ringing in the ears. As with any medicine, there is a very remote chance of a vaccine causing a severe allergic reaction, other serious injury, or death. 5. What if there is a serious problem? An allergic reaction could occur after the vaccinated person leaves the clinic. If you see signs of a severe allergic reaction (hives, swelling of the face and throat, difficulty breathing, a fast heartbeat, dizziness, or weakness), call 9-1-1 and get the person to the nearest hospital. For other signs that concern you, call your health care provider. Adverse reactions should be reported to the Vaccine Adverse Event Reporting System (VAERS). Your health care provider will usually file this report, or you can do it yourself. Visit the VAERS website at www.vaers.SamedayNews.es or call 352-595-1522. VAERS is only for reporting reactions, and VAERS staff members do not give medical advice. 6. The National Vaccine Injury Compensation  Program The [employer] and [employer]s Injury Compensation  Program (VICP) is a federal program that was created to compensate people who may have been injured by certain vaccines. Claims regarding alleged injury or death due to vaccination have a time limit for filing, which may be as short as two years. Visit the VICP website at GoldCloset.com.ee or call 979-473-3618 to learn about the program and about filing a claim. 7. How can I learn more? Ask your health care provider. Call your local or state health department. Visit the website of the Food and Drug Administration (FDA) for vaccine package inserts and additional information at TraderRating.uy. Contact the Centers for Disease Control and Prevention (CDC): Call (832) 685-7534 (1-800-CDC-INFO) or Visit CDC's website http://hunter.com/. Source: CDC Vaccine Information Statement Meningococcal B Vaccine (07/14/2020) This same material is available at http://www.wolf.info/ for no charge. This information is not intended to replace advice given to you by your health care provider. Make sure you discuss any questions you have with your health care provider. Document Revised: 10/24/2021 Document Reviewed: 08/16/2021 Elsevier Patient Education  [place].

## 2022-08-02 ENCOUNTER — Other Ambulatory Visit (HOSPITAL_COMMUNITY): Payer: Self-pay

## 2022-08-02 ENCOUNTER — Telehealth: Payer: Self-pay

## 2022-08-02 ENCOUNTER — Other Ambulatory Visit: Payer: Self-pay

## 2022-08-02 DIAGNOSIS — C49A2 Gastrointestinal stromal tumor of stomach: Secondary | ICD-10-CM

## 2022-08-02 MED ORDER — POTASSIUM CHLORIDE CRYS ER 20 MEQ PO TBCR
20.0000 meq | EXTENDED_RELEASE_TABLET | Freq: Every day | ORAL | 0 refills | Status: DC
  Filled 2022-08-02: qty 20, 20d supply, fill #0

## 2022-08-02 NOTE — Telephone Encounter (Signed)
I spoke with the patient's daughter. She verbal understanding and had no further questions or concerns

## 2022-08-02 NOTE — Telephone Encounter (Signed)
-----   Message from Owens Shark, NP sent at 08/02/2022  8:18 AM EDT ----- Please let him know potassium level is low and send a prescription for K-Dur 20 meq daily to his pharmacy.  Thanks

## 2022-08-08 ENCOUNTER — Other Ambulatory Visit (HOSPITAL_COMMUNITY): Payer: Self-pay

## 2022-08-13 ENCOUNTER — Other Ambulatory Visit (HOSPITAL_COMMUNITY): Payer: Self-pay

## 2022-08-15 ENCOUNTER — Other Ambulatory Visit: Payer: Self-pay | Admitting: Oncology

## 2022-08-15 ENCOUNTER — Other Ambulatory Visit (HOSPITAL_COMMUNITY): Payer: Self-pay

## 2022-08-15 MED ORDER — IMATINIB MESYLATE 100 MG PO TABS
ORAL_TABLET | ORAL | 2 refills | Status: DC
  Filled 2022-08-15 – 2022-09-20 (×2): qty 120, 30d supply, fill #0

## 2022-08-16 ENCOUNTER — Other Ambulatory Visit (HOSPITAL_COMMUNITY): Payer: Self-pay

## 2022-08-19 ENCOUNTER — Other Ambulatory Visit (HOSPITAL_COMMUNITY): Payer: Self-pay

## 2022-08-28 ENCOUNTER — Other Ambulatory Visit (HOSPITAL_COMMUNITY): Payer: Self-pay

## 2022-09-12 ENCOUNTER — Inpatient Hospital Stay: Payer: Medicare Other | Attending: Oncology

## 2022-09-12 ENCOUNTER — Inpatient Hospital Stay: Payer: Medicare Other | Admitting: Oncology

## 2022-09-12 VITALS — BP 160/90 | HR 82 | Temp 98.2°F | Resp 18 | Ht 62.0 in | Wt 152.8 lb

## 2022-09-12 DIAGNOSIS — C49A2 Gastrointestinal stromal tumor of stomach: Secondary | ICD-10-CM | POA: Diagnosis present

## 2022-09-12 LAB — CMP (CANCER CENTER ONLY)
ALT: 16 U/L (ref 0–44)
AST: 20 U/L (ref 15–41)
Albumin: 4.1 g/dL (ref 3.5–5.0)
Alkaline Phosphatase: 98 U/L (ref 38–126)
Anion gap: 11 (ref 5–15)
BUN: 18 mg/dL (ref 8–23)
CO2: 24 mmol/L (ref 22–32)
Calcium: 9.3 mg/dL (ref 8.9–10.3)
Chloride: 101 mmol/L (ref 98–111)
Creatinine: 0.87 mg/dL (ref 0.61–1.24)
GFR, Estimated: >60 mL/min (ref >60)
Glucose, Bld: 196 mg/dL — ABNORMAL HIGH (ref 70–99)
Potassium: 3.5 mmol/L (ref 3.5–5.1)
Sodium: 136 mmol/L (ref 135–145)
Total Bilirubin: 0.6 mg/dL (ref 0.3–1.2)
Total Protein: 8.1 g/dL (ref 6.5–8.1)

## 2022-09-12 LAB — CBC WITH DIFFERENTIAL (CANCER CENTER ONLY)
Abs Immature Granulocytes: 0.02 10*3/uL (ref 0.00–0.07)
Basophils Absolute: 0.1 10*3/uL (ref 0.0–0.1)
Basophils Relative: 1 %
Eosinophils Absolute: 0.7 10*3/uL — ABNORMAL HIGH (ref 0.0–0.5)
Eosinophils Relative: 8 %
HCT: 39.1 % (ref 39.0–52.0)
Hemoglobin: 13 g/dL (ref 13.0–17.0)
Immature Granulocytes: 0 %
Lymphocytes Relative: 36 %
Lymphs Abs: 3 10*3/uL (ref 0.7–4.0)
MCH: 28.2 pg (ref 26.0–34.0)
MCHC: 33.2 g/dL (ref 30.0–36.0)
MCV: 84.8 fL (ref 80.0–100.0)
Monocytes Absolute: 1 10*3/uL (ref 0.1–1.0)
Monocytes Relative: 11 %
Neutro Abs: 3.7 10*3/uL (ref 1.7–7.7)
Neutrophils Relative %: 44 %
Platelet Count: 523 10*3/uL — ABNORMAL HIGH (ref 150–400)
RBC: 4.61 MIL/uL (ref 4.22–5.81)
RDW: 17.2 % — ABNORMAL HIGH (ref 11.5–15.5)
WBC Count: 8.4 10*3/uL (ref 4.0–10.5)
nRBC: 0 % (ref 0.0–0.2)

## 2022-09-12 NOTE — Progress Notes (Signed)
[place] OFFICE PROGRESS NOTE   Diagnosis: GIST of the stomach  INTERVAL HISTORY:   Kevin [name] returns as scheduled.  He is here with a [name] interpreter.  No complaint.  No rash, nausea or diarrhea.  He is taking Gleevec.  No complaint.  Objective:  Vital signs in last 24 hours:  Blood pressure (!) 160/90, pulse 82, temperature 98.2 F (36.8 C), temperature source Oral, resp. rate 18, height '5\' 2"'$  (1.575 m), weight 152 lb 12.8 oz (69.3 kg), SpO2 100 %.    HEENT: No thrush or ulcers Resp: Lungs clear bilaterally Cardio: Regular rate and rhythm GI: No hepatosplenomegaly, no mass, nontender Vascular: No leg edema  Skin: No rash   Lab Results:  Lab Results  Component Value Date   WBC 8.4 09/12/2022   HGB 13.0 09/12/2022   HCT 39.1 09/12/2022   MCV 84.8 09/12/2022   PLT 523 (H) 09/12/2022   NEUTROABS 3.7 09/12/2022    CMP  Lab Results  Component Value Date   NA 131 (L) 08/01/2022   K 3.1 (L) 08/01/2022   CL 98 08/01/2022   CO2 23 08/01/2022   GLUCOSE 227 (H) 08/01/2022   BUN 12 08/01/2022   CREATININE 0.79 08/01/2022   CALCIUM 9.3 08/01/2022   PROT 8.1 08/01/2022   ALBUMIN 4.1 08/01/2022   AST 17 08/01/2022   ALT 16 08/01/2022   ALKPHOS 93 08/01/2022   BILITOT 0.4 08/01/2022   GFRNONAA >60 08/01/2022   GFRAA >60 09/01/2020   Medications: I have reviewed the patient's current medications.   Assessment/Plan: GIST of the stomach CT abdomen/pelvis 01/21/2020-large necrotic mass centered in the posterior wall of the stomach, irregular hypodense mass right lobe liver  Upper endoscopy 01/24/2020-large infiltrative mass with no bleeding found in the posterior wall of the stomach.  Biopsy-gastrointestinal stromal tumor  MRI liver 01/28/2020-large exophytic centrally necrotic 17.3 x 12.3 cm gastric mass arising from posterior proximal stomach centered in the left upper quadrant with thick enhancing wall.  Complex multilocular 10.7 x 8.2 cm cystic  liver mass right liver lobe.   Neoadjuvant Gleevec 01/28/2020 CT 03/03/2020, resolution of fluid component of right hepatic abscess, unchanged mass posterior to the gastric body, unchanged left periaortic and aortocaval adenopathy CT 06/01/2020-decreased size of a multilobulated mass arising from the posterior stomach, unchanged retroperitoneal lymph nodes, removal of liver drain resolution of abscess CT 09/14/20-no significant change in the large left upper quadrant mass arising from the proximal stomach.  Near complete resolution of right hepatic lobe abscess. CT 06/06/2021-no change in mass arising from the posterior gastric fundus, no lymphadenopathy or evidence of metastatic disease Patient discontinued Gleevec around September 2022 per his report CT 12/19/2021-significant interval increase in size of the large gastric mass which is partially necrotic, measures 16.5 x 14 cm compared to previous measurement of 9 x 7.6 cm.  Lumen of the stomach is significantly compressed.  Also mass-effect on the pancreas, spleen, left kidney and left adrenal gland.  No definite findings for direct invasion of these organs.  No findings of hepatic metastatic disease.  Stable small scattered mesenteric and retroperitoneal lymph nodes. 12/25/2021 Gleevec 400 mg daily CT abdomen/pelvis 03/15/2022-interval 67% reduction in volume of the large left upper quadrant GI stromal tumor, scattered small retroperitoneal lymphadenopathy similar to prior CT abdomen/pelvis 04/15/2022-large exophytic mass arising off the posterior wall of the stomach noted and compared to prior exam there has been a mild decrease in tumor volume, signs of internal necrosis, new gas identified within  the dominant chronic component. 05/29/2022 resection of the gastric mass, partial gastrectomy, distal pancreatectomy and splenectomy by Dr. Zenia Resides. Pathology showed gastrointestinal stromal tumor with extensive necrosis measuring 4.7 x 13.2 x 8.1 cm; tumor arises  within the stomach ulcerating mucosa and abuts the spleen and pancreas; mitotic rate 49; necrosis present comprising approximately 95% of the tumor; treatment effect present; all margins negative for tumor; 0 of 13 lymph nodes involved; risk assessment is high risk; pT4, pN0 Gleevec resumed 400 mg daily following surgery 2. Liver lesion in the right lobe             -Liver mass consistent with abscess on MRI of the liver from 01/27/2020             -Status post CT-guided drainage of the liver abscess on 01/28/2020-culture positive for Streptococcus intermedius             -IV antibiotics and discharged home on 10-day course of Augmentin             -CT 03/03/2020-clearance of fluid component of liver abscess, unchanged mass posterior to the gastric body, stable left periaortic and aortocaval adenopathy             -Abscess drain removed 03/09/2020             -Liver abscess resolved on CT 06/01/2020 and 09/14/2020   3. Iron deficiency anemia-oral iron,  resolved Anemia on hospital admission 04/15/2022-likely secondary to sepsis and phlebotomy 4. Gastritis and H. pylori infection 5.  History of fever secondary to the liver abscess 6. Protein calorie malnutrition-resolved 7.  Hospital admission 04/15/2022-sepsis, blood culture positive for Staph epidermidis      Disposition: [name] appears stable.  He is tolerating the [place] well.  He will continue Gleevec at the current dose.  The plan is to continue [place] at least until June 2024.  He will return for an office and lab visit in 6 weeks.  Betsy Coder, MD  09/12/2022  11:32 AM

## 2022-09-12 NOTE — Progress Notes (Signed)
Interpreter, [place] assisted with visit today from [facility]

## 2022-09-16 ENCOUNTER — Other Ambulatory Visit (HOSPITAL_COMMUNITY): Payer: Self-pay

## 2022-09-16 ENCOUNTER — Encounter: Payer: Self-pay | Admitting: *Deleted

## 2022-09-16 NOTE — Progress Notes (Signed)
Notified by oral oncology team that Kevin [name] has not refilled his Gleevec since June 2023, despite informing nurse/MD at last visit w/interpreter that he was taking it. Oral oncology team will reach out to daughter to discuss compliance.

## 2022-09-18 ENCOUNTER — Other Ambulatory Visit (HOSPITAL_COMMUNITY): Payer: Self-pay

## 2022-09-20 ENCOUNTER — Other Ambulatory Visit (HOSPITAL_COMMUNITY): Payer: Self-pay

## 2022-09-20 ENCOUNTER — Telehealth: Payer: Self-pay

## 2022-09-20 ENCOUNTER — Other Ambulatory Visit: Payer: Self-pay | Admitting: *Deleted

## 2022-09-20 DIAGNOSIS — C49A2 Gastrointestinal stromal tumor of stomach: Secondary | ICD-10-CM

## 2022-09-20 MED ORDER — IMATINIB MESYLATE 400 MG PO TABS
400.0000 mg | ORAL_TABLET | Freq: Every day | ORAL | 2 refills | Status: DC
  Filled 2022-09-20 – 2022-09-24 (×2): qty 30, 30d supply, fill #0
  Filled 2022-10-16 – 2022-10-21 (×2): qty 30, 30d supply, fill #1
  Filled 2022-12-25: qty 30, 30d supply, fill #2

## 2022-09-20 NOTE — Progress Notes (Signed)
Patient's adherence issue might be related to imatinib cost. Patient's daughter [name] reported  to the [employer] (Specialty) not being able to afford the medication cost.   Unfortunately there is no mfg assistance available for imatinib and no GIST grants open.  Manuela Schwartz is going to reach out to [employer] about the patient being able to be signed up for the cancer center grant.  To minimize the cost monthly, discussed with [name] switching her father from four '100mg'$  tablets to one '400mg'$  tablet. He has issues with swallowing the '400mg'$  tablet but it can be dissolved in water or apple sauce to aid in swallowing. She think her father would be hesitant about that change. I asked her to think it over with him.  Also discussed with [place] signing her father up for [place] Part D Extra Help. I helped her find the application online and the plans on applying. I asked her to let me know if he is approved. This would help to decrease all of his medication cost, including the imatinib.

## 2022-09-20 NOTE — Telephone Encounter (Signed)
Patient not able to afford the Gleevec 100 mg tablets. Sent new script for 400 mg tablet to take daily which will reduce cost to $60/month per oral oncology team. Awaiting managed care to determine if he will qualify for the $700 grant at [facility]. Attempted to reach daughter.

## 2022-09-20 NOTE — Telephone Encounter (Signed)
Spoke to pt's daughter, [name], regarding cost of medication. [name] stated pt could not afford medication at this time. We have reached out to see about changing to 400 mg tab once daily and to see if pt is eligible for Emergency Funding. Will continue to follow and update.  Berdine Addison, [facility] [place]  [phone] (phone) [phone] (fax) 09/20/2022 11:21 AM

## 2022-09-24 ENCOUNTER — Telehealth: Payer: Self-pay

## 2022-09-24 ENCOUNTER — Other Ambulatory Visit (HOSPITAL_COMMUNITY): Payer: Self-pay

## 2022-09-24 NOTE — Telephone Encounter (Signed)
CSW attempted to contact patient to assess needs per the request of the Nurse Navigator.  Through the interpreter services, CSW was unable to leave a vm because it was full.  CSW also attempted to contact patient's daughter, [name], but her vm was also full.  CSW will attempt to contact patient again at a later time.

## 2022-10-14 ENCOUNTER — Other Ambulatory Visit (HOSPITAL_COMMUNITY): Payer: Self-pay

## 2022-10-16 ENCOUNTER — Other Ambulatory Visit (HOSPITAL_COMMUNITY): Payer: Self-pay

## 2022-10-21 ENCOUNTER — Other Ambulatory Visit (HOSPITAL_COMMUNITY): Payer: Self-pay

## 2022-10-21 NOTE — Telephone Encounter (Signed)
Per pharmacy documentation, patient's daughter has agreed to have the medication filled at [facility] (Specialty) and pay the cost. The office has not been able to get the documents from the patient needed to sign him up for the cancer center grant. If that does occur in the future then that can be used by the pharmacy to cover the medication cost.   For now, the oral chemo team with sign off of this patient issue.

## 2022-10-23 ENCOUNTER — Other Ambulatory Visit (HOSPITAL_COMMUNITY): Payer: Self-pay

## 2022-10-24 ENCOUNTER — Encounter: Payer: Self-pay | Admitting: Nurse Practitioner

## 2022-10-24 ENCOUNTER — Inpatient Hospital Stay (HOSPITAL_BASED_OUTPATIENT_CLINIC_OR_DEPARTMENT_OTHER): Payer: Medicare Other | Admitting: Nurse Practitioner

## 2022-10-24 ENCOUNTER — Telehealth: Payer: Self-pay

## 2022-10-24 ENCOUNTER — Inpatient Hospital Stay: Payer: Medicare Other | Attending: Oncology

## 2022-10-24 VITALS — BP 188/90 | HR 85 | Temp 99.0°F | Resp 18 | Wt 155.4 lb

## 2022-10-24 DIAGNOSIS — Z903 Acquired absence of stomach [part of]: Secondary | ICD-10-CM | POA: Insufficient documentation

## 2022-10-24 DIAGNOSIS — R42 Dizziness and giddiness: Secondary | ICD-10-CM | POA: Insufficient documentation

## 2022-10-24 DIAGNOSIS — R030 Elevated blood-pressure reading, without diagnosis of hypertension: Secondary | ICD-10-CM | POA: Insufficient documentation

## 2022-10-24 DIAGNOSIS — C49A2 Gastrointestinal stromal tumor of stomach: Secondary | ICD-10-CM

## 2022-10-24 DIAGNOSIS — Z9081 Acquired absence of spleen: Secondary | ICD-10-CM | POA: Diagnosis not present

## 2022-10-24 LAB — CBC WITH DIFFERENTIAL (CANCER CENTER ONLY)
Abs Immature Granulocytes: 0.04 10*3/uL (ref 0.00–0.07)
Basophils Absolute: 0 10*3/uL (ref 0.0–0.1)
Basophils Relative: 0 %
Eosinophils Absolute: 0.1 10*3/uL (ref 0.0–0.5)
Eosinophils Relative: 2 %
HCT: 40.8 % (ref 39.0–52.0)
Hemoglobin: 13.7 g/dL (ref 13.0–17.0)
Immature Granulocytes: 1 %
Lymphocytes Relative: 51 %
Lymphs Abs: 3.8 10*3/uL (ref 0.7–4.0)
MCH: 28.9 pg (ref 26.0–34.0)
MCHC: 33.6 g/dL (ref 30.0–36.0)
MCV: 86.1 fL (ref 80.0–100.0)
Monocytes Absolute: 0.8 10*3/uL (ref 0.1–1.0)
Monocytes Relative: 11 %
Neutro Abs: 2.5 10*3/uL (ref 1.7–7.7)
Neutrophils Relative %: 35 %
Platelet Count: 456 10*3/uL — ABNORMAL HIGH (ref 150–400)
RBC: 4.74 MIL/uL (ref 4.22–5.81)
RDW: 16.6 % — ABNORMAL HIGH (ref 11.5–15.5)
WBC Count: 7.3 10*3/uL (ref 4.0–10.5)
nRBC: 0 % (ref 0.0–0.2)

## 2022-10-24 LAB — CMP (CANCER CENTER ONLY)
ALT: 21 U/L (ref 0–44)
AST: 20 U/L (ref 15–41)
Albumin: 4.2 g/dL (ref 3.5–5.0)
Alkaline Phosphatase: 84 U/L (ref 38–126)
Anion gap: 9 (ref 5–15)
BUN: 15 mg/dL (ref 8–23)
CO2: 27 mmol/L (ref 22–32)
Calcium: 9.2 mg/dL (ref 8.9–10.3)
Chloride: 100 mmol/L (ref 98–111)
Creatinine: 0.9 mg/dL (ref 0.61–1.24)
GFR, Estimated: >60 mL/min (ref >60)
Glucose, Bld: 217 mg/dL — ABNORMAL HIGH (ref 70–99)
Potassium: 3.7 mmol/L (ref 3.5–5.1)
Sodium: 136 mmol/L (ref 135–145)
Total Bilirubin: 0.6 mg/dL (ref 0.3–1.2)
Total Protein: 8 g/dL (ref 6.5–8.1)

## 2022-10-24 LAB — MAGNESIUM: Magnesium: 2.2 mg/dL (ref 1.7–2.4)

## 2022-10-24 NOTE — Progress Notes (Signed)
[place] OFFICE PROGRESS NOTE   Diagnosis: GIST of the stomach  INTERVAL HISTORY:   Mr. Schetter returns as scheduled.  He is here with a [name] interpreter.  He has discontinued Gleevec due to dizziness.  About 30 minutes after each dose he feels dizzy.  This last throughout the day.  When he does not take [place] he does not feel dizzy.  No nausea or vomiting.  No diarrhea.  No rash.  Objective:  Vital signs in last 24 hours:  Blood pressure (!) 188/90, pulse 85, temperature 99 F (37.2 C), temperature source Tympanic, resp. rate 18, weight 155 lb 6.4 oz (70.5 kg), SpO2 98 %.    HEENT: No thrush or ulcers. Resp: Lungs clear bilaterally. Cardio: Regular rate and rhythm. GI: No hepatosplenomegaly.  No mass.  Nontender. Vascular: No leg edema. Skin: No rash.   Lab Results:  Lab Results  Component Value Date   WBC 7.3 10/24/2022   HGB 13.7 10/24/2022   HCT 40.8 10/24/2022   MCV 86.1 10/24/2022   PLT 456 (H) 10/24/2022   NEUTROABS 2.5 10/24/2022    Imaging:  No results found.  Medications: I have reviewed the patient's current medications.  Assessment/Plan: GIST of the stomach CT abdomen/pelvis 01/21/2020-large necrotic mass centered in the posterior wall of the stomach, irregular hypodense mass right lobe liver  Upper endoscopy 01/24/2020-large infiltrative mass with no bleeding found in the posterior wall of the stomach.  Biopsy-gastrointestinal stromal tumor  MRI liver 01/28/2020-large exophytic centrally necrotic 17.3 x 12.3 cm gastric mass arising from posterior proximal stomach centered in the left upper quadrant with thick enhancing wall.  Complex multilocular 10.7 x 8.2 cm cystic liver mass right liver lobe.   Neoadjuvant Gleevec 01/28/2020 CT 03/03/2020, resolution of fluid component of right hepatic abscess, unchanged mass posterior to the gastric body, unchanged left periaortic and aortocaval adenopathy CT 06/01/2020-decreased size of a  multilobulated mass arising from the posterior stomach, unchanged retroperitoneal lymph nodes, removal of liver drain resolution of abscess CT 09/14/20-no significant change in the large left upper quadrant mass arising from the proximal stomach.  Near complete resolution of right hepatic lobe abscess. CT 06/06/2021-no change in mass arising from the posterior gastric fundus, no lymphadenopathy or evidence of metastatic disease Patient discontinued Gleevec around September 2022 per his report CT 12/19/2021-significant interval increase in size of the large gastric mass which is partially necrotic, measures 16.5 x 14 cm compared to previous measurement of 9 x 7.6 cm.  Lumen of the stomach is significantly compressed.  Also mass-effect on the pancreas, spleen, left kidney and left adrenal gland.  No definite findings for direct invasion of these organs.  No findings of hepatic metastatic disease.  Stable small scattered mesenteric and retroperitoneal lymph nodes. 12/25/2021 Gleevec 400 mg daily CT abdomen/pelvis 03/15/2022-interval 67% reduction in volume of the large left upper quadrant GI stromal tumor, scattered small retroperitoneal lymphadenopathy similar to prior CT abdomen/pelvis 04/15/2022-large exophytic mass arising off the posterior wall of the stomach noted and compared to prior exam there has been a mild decrease in tumor volume, signs of internal necrosis, new gas identified within the dominant chronic component. 05/29/2022 resection of the gastric mass, partial gastrectomy, distal pancreatectomy and splenectomy by Dr. Zenia Resides. 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 Liver lesion in the right lobe             -Liver mass consistent with abscess on MRI of the liver from 01/27/2020             -Status post CT-guided drainage of the liver abscess on 01/28/2020-culture positive for Streptococcus intermedius             -IV antibiotics and discharged home on 10-day course of Augmentin             -CT 03/03/2020-clearance of fluid component of liver abscess, unchanged mass posterior to the gastric body, stable left periaortic and aortocaval adenopathy             -Abscess drain removed 03/09/2020             -Liver abscess resolved on CT 06/01/2020 and 09/14/2020              3. Iron deficiency anemia-oral iron,  resolved Anemia on hospital admission 04/15/2022-likely secondary to sepsis and phlebotomy 4. Gastritis and H. pylori infection 5.  History of fever secondary to the liver abscess 6. Protein calorie malnutrition-resolved 7.  Hospital admission 04/15/2022-sepsis, blood culture positive for Staph epidermidis  Disposition: Mr. Haran appears unchanged.  He has discontinued Gleevec due to dizziness.  He agrees to resume Gleevec at a reduced dose of 200 mg daily.  He will contact the office if he continues to have dizziness.  CBC and chemistry panel reviewed.  Labs adequate to take [place] as above.  Blood pressure is elevated in the office today.  It is unclear if he is taking his blood pressure medication.  We will contact his PCP for clarification on antihypertensive medications.  He will return for lab and follow-up in 4 weeks.  We are available to see him sooner if needed.  We will contact his daughter and communicate the above.    Ned Card ANP/GNP-BC   10/24/2022  12:15 PM

## 2022-10-24 NOTE — Telephone Encounter (Signed)
I faxed over [phone]  to Dr Durenda Age office of the patient vital history. We requested if the patient can come in for a visit to assess his elevated blood pressure. 2. I spoke with the patient daughter [name] and to see if we can get his proof income statement. [name] stated she was not aware he needed his proof of income. 3. I also let [place] know Mr. Campanelli will be taking only  2 tablet with 200 mg of Gleevec.

## 2022-11-06 ENCOUNTER — Telehealth: Payer: Self-pay | Admitting: Internal Medicine

## 2022-11-06 ENCOUNTER — Other Ambulatory Visit: Payer: Self-pay

## 2022-11-06 DIAGNOSIS — I10 Essential (primary) hypertension: Secondary | ICD-10-CM

## 2022-11-06 MED ORDER — LISINOPRIL 10 MG PO TABS
10.0000 mg | ORAL_TABLET | Freq: Every day | ORAL | 0 refills | Status: DC
  Filled 2022-11-06: qty 30, 30d supply, fill #0

## 2022-11-06 MED ORDER — AMLODIPINE BESYLATE 10 MG PO TABS
10.0000 mg | ORAL_TABLET | Freq: Every day | ORAL | 0 refills | Status: DC
  Filled 2022-11-06: qty 30, 30d supply, fill #0

## 2022-11-07 ENCOUNTER — Other Ambulatory Visit (HOSPITAL_COMMUNITY): Payer: Self-pay

## 2022-11-07 ENCOUNTER — Other Ambulatory Visit: Payer: Self-pay

## 2022-11-07 NOTE — Telephone Encounter (Signed)
Called & spoke with the patient. Confirmed name & DOB. Informed of message below. Scheduled an appointment for 11/25/2022. Patient expressed understanding.

## 2022-11-12 ENCOUNTER — Other Ambulatory Visit (HOSPITAL_COMMUNITY): Payer: Self-pay

## 2022-11-14 ENCOUNTER — Inpatient Hospital Stay: Payer: Medicare Other | Attending: Oncology

## 2022-11-14 ENCOUNTER — Other Ambulatory Visit (HOSPITAL_COMMUNITY): Payer: Self-pay

## 2022-11-14 ENCOUNTER — Inpatient Hospital Stay: Payer: Medicare Other | Admitting: Nurse Practitioner

## 2022-11-14 ENCOUNTER — Encounter: Payer: Self-pay | Admitting: Nurse Practitioner

## 2022-11-14 VITALS — BP 160/98 | HR 80 | Temp 98.2°F | Resp 16 | Wt 153.4 lb

## 2022-11-14 DIAGNOSIS — C49A2 Gastrointestinal stromal tumor of stomach: Secondary | ICD-10-CM | POA: Diagnosis present

## 2022-11-14 LAB — CBC WITH DIFFERENTIAL (CANCER CENTER ONLY)
Abs Immature Granulocytes: 0.01 10*3/uL (ref 0.00–0.07)
Basophils Absolute: 0 10*3/uL (ref 0.0–0.1)
Basophils Relative: 0 %
Eosinophils Absolute: 0.1 10*3/uL (ref 0.0–0.5)
Eosinophils Relative: 2 %
HCT: 41.9 % (ref 39.0–52.0)
Hemoglobin: 14 g/dL (ref 13.0–17.0)
Immature Granulocytes: 0 %
Lymphocytes Relative: 46 %
Lymphs Abs: 3.3 10*3/uL (ref 0.7–4.0)
MCH: 28.9 pg (ref 26.0–34.0)
MCHC: 33.4 g/dL (ref 30.0–36.0)
MCV: 86.6 fL (ref 80.0–100.0)
Monocytes Absolute: 0.7 10*3/uL (ref 0.1–1.0)
Monocytes Relative: 10 %
Neutro Abs: 3 10*3/uL (ref 1.7–7.7)
Neutrophils Relative %: 42 %
Platelet Count: 534 10*3/uL — ABNORMAL HIGH (ref 150–400)
RBC: 4.84 MIL/uL (ref 4.22–5.81)
RDW: 15.9 % — ABNORMAL HIGH (ref 11.5–15.5)
WBC Count: 7.1 10*3/uL (ref 4.0–10.5)
nRBC: 0 % (ref 0.0–0.2)

## 2022-11-14 LAB — CMP (CANCER CENTER ONLY)
ALT: 14 U/L (ref 0–44)
AST: 20 U/L (ref 15–41)
Albumin: 4.2 g/dL (ref 3.5–5.0)
Alkaline Phosphatase: 102 U/L (ref 38–126)
Anion gap: 8 (ref 5–15)
BUN: 16 mg/dL (ref 8–23)
CO2: 28 mmol/L (ref 22–32)
Calcium: 9.2 mg/dL (ref 8.9–10.3)
Chloride: 99 mmol/L (ref 98–111)
Creatinine: 0.92 mg/dL (ref 0.61–1.24)
GFR, Estimated: >60 mL/min (ref >60)
Glucose, Bld: 204 mg/dL — ABNORMAL HIGH (ref 70–99)
Potassium: 3.5 mmol/L (ref 3.5–5.1)
Sodium: 135 mmol/L (ref 135–145)
Total Bilirubin: 0.7 mg/dL (ref 0.3–1.2)
Total Protein: 8.1 g/dL (ref 6.5–8.1)

## 2022-11-14 NOTE — Progress Notes (Signed)
[place] OFFICE PROGRESS NOTE   Diagnosis: GIST of the stomach  INTERVAL HISTORY:   Mr. Wedge returns as scheduled.  He is accompanied by [name] interpreter.  She remained present throughout the office visit.  At his last office visit he notified us that he had discontinued Gleevec due to dizziness.  He was agreeable to resuming Gleevec at a reduced dose of 200 mg daily.  He is seen today for follow-up.  He confirms resuming Gleevec at the reduced dose of 200 mg daily.  No further dizziness.  No nausea or vomiting.  No diarrhea.  He has a good appetite.  No abdominal pain.  Objective:  Vital signs in last 24 hours:  Blood pressure (!) 160/98, pulse 80, temperature 98.2 F (36.8 C), temperature source Temporal, resp. rate 16, weight 153 lb 6.4 oz (69.6 kg), SpO2 100 %.    HEENT: No thrush or ulcers. Resp: Lungs clear bilaterally. Cardio: Regular rate and rhythm. GI: Abdomen soft and nontender.  No hepatosplenomegaly.  Midline surgical scar. Vascular: No leg edema. Skin: No rash.   Lab Results:  Lab Results  Component Value Date   WBC 7.1 11/14/2022   HGB 14.0 11/14/2022   HCT 41.9 11/14/2022   MCV 86.6 11/14/2022   PLT 534 (H) 11/14/2022   NEUTROABS 3.0 11/14/2022    Imaging:  No results found.  Medications: I have reviewed the patient's current medications.  Assessment/Plan: GIST of the stomach CT abdomen/pelvis 01/21/2020-large necrotic mass centered in the posterior wall of the stomach, irregular hypodense mass right lobe liver  Upper endoscopy 01/24/2020-large infiltrative mass with no bleeding found in the posterior wall of the stomach.  Biopsy-gastrointestinal stromal tumor  MRI liver 01/28/2020-large exophytic centrally necrotic 17.3 x 12.3 cm gastric mass arising from posterior proximal stomach centered in the left upper quadrant with thick enhancing wall.  Complex multilocular 10.7 x 8.2 cm cystic liver mass right liver lobe.   Neoadjuvant  Gleevec 01/28/2020 CT 03/03/2020, resolution of fluid component of right hepatic abscess, unchanged mass posterior to the gastric body, unchanged left periaortic and aortocaval adenopathy CT 06/01/2020-decreased size of a multilobulated mass arising from the posterior stomach, unchanged retroperitoneal lymph nodes, removal of liver drain resolution of abscess CT 09/14/20-no significant change in the large left upper quadrant mass arising from the proximal stomach.  Near complete resolution of right hepatic lobe abscess. CT 06/06/2021-no change in mass arising from the posterior gastric fundus, no lymphadenopathy or evidence of metastatic disease Patient discontinued Gleevec around September 2022 per his report CT 12/19/2021-significant interval increase in size of the large gastric mass which is partially necrotic, measures 16.5 x 14 cm compared to previous measurement of 9 x 7.6 cm.  Lumen of the stomach is significantly compressed.  Also mass-effect on the pancreas, spleen, left kidney and left adrenal gland.  No definite findings for direct invasion of these organs.  No findings of hepatic metastatic disease.  Stable small scattered mesenteric and retroperitoneal lymph nodes. 12/25/2021 Gleevec 400 mg daily CT abdomen/pelvis 03/15/2022-interval 67% reduction in volume of the large left upper quadrant GI stromal tumor, scattered small retroperitoneal lymphadenopathy similar to prior CT abdomen/pelvis 04/15/2022-large exophytic mass arising off the posterior wall of the stomach noted and compared to prior exam there has been a mild decrease in tumor volume, signs of internal necrosis, new gas identified within the dominant chronic component. 05/29/2022 resection of the gastric mass, partial gastrectomy, distal pancreatectomy and splenectomy by Dr. Zenia Resides. Pathology showed gastrointestinal stromal tumor  with extensive necrosis measuring 4.7 x 13.2 x 8.1 cm; tumor arises within the stomach ulcerating mucosa and abuts  the spleen and pancreas; mitotic rate 49; necrosis present comprising approximately 95% of the tumor; treatment effect present; all margins negative for tumor; 0 of 13 lymph nodes involved; risk assessment is high risk; pT4, pN0 Gleevec resumed 400 mg daily following surgery Gleevec discontinued per patient sometime between 09/12/2022 and today due to dizziness Gleevec resumed 200 mg daily beginning 10/25/2022 2. Liver lesion in the right lobe             -Liver mass consistent with abscess on MRI of the liver from 01/27/2020             -Status post CT-guided drainage of the liver abscess on 01/28/2020-culture positive for Streptococcus intermedius             -IV antibiotics and discharged home on 10-day course of Augmentin             -CT 03/03/2020-clearance of fluid component of liver abscess, unchanged mass posterior to the gastric body, stable left periaortic and aortocaval adenopathy             -Abscess drain removed 03/09/2020             -Liver abscess resolved on CT 06/01/2020 and 09/14/2020              3. Iron deficiency anemia-oral iron,  resolved Anemia on hospital admission 04/15/2022-likely secondary to sepsis and phlebotomy 4. Gastritis and H. pylori infection 5.  History of fever secondary to the liver abscess 6. Protein calorie malnutrition-resolved 7.  Hospital admission 04/15/2022-sepsis, blood culture positive for Staph epidermidis    Disposition: Kevin [name] appears stable.  He is tolerating Gleevec well.  He will continue at the current dose of 200 mg daily.  CBC and chemistry panel reviewed.  Labs adequate to continue [place] as above.  He will return for lab and follow-up in 6 weeks.    Ned Card ANP/GNP-BC   11/14/2022  1:22 PM

## 2022-11-18 ENCOUNTER — Other Ambulatory Visit (HOSPITAL_COMMUNITY): Payer: Self-pay

## 2022-11-25 ENCOUNTER — Ambulatory Visit: Payer: Medicare Other | Admitting: Internal Medicine

## 2022-12-05 ENCOUNTER — Other Ambulatory Visit: Payer: Self-pay

## 2022-12-24 ENCOUNTER — Telehealth: Payer: Self-pay | Admitting: *Deleted

## 2022-12-24 NOTE — Telephone Encounter (Signed)
Informed daughter that pharmacy staff have been trying to reach them to arrange his [place] delivery. She states she will go to [place] to pick up.

## 2022-12-25 ENCOUNTER — Other Ambulatory Visit (HOSPITAL_COMMUNITY): Payer: Self-pay

## 2022-12-27 ENCOUNTER — Emergency Department (HOSPITAL_BASED_OUTPATIENT_CLINIC_OR_DEPARTMENT_OTHER)
Admission: EM | Admit: 2022-12-27 | Discharge: 2022-12-27 | Disposition: A | Discharge status: Home / Self Care | Payer: Medicare (Managed Care) | Attending: Emergency Medicine | Admitting: Emergency Medicine

## 2022-12-27 ENCOUNTER — Ambulatory Visit: Payer: Self-pay

## 2022-12-27 ENCOUNTER — Inpatient Hospital Stay: Payer: Medicare (Managed Care) | Admitting: Oncology

## 2022-12-27 ENCOUNTER — Other Ambulatory Visit: Payer: Self-pay

## 2022-12-27 ENCOUNTER — Inpatient Hospital Stay: Payer: Medicare (Managed Care) | Attending: Oncology

## 2022-12-27 ENCOUNTER — Encounter (HOSPITAL_BASED_OUTPATIENT_CLINIC_OR_DEPARTMENT_OTHER): Payer: Self-pay

## 2022-12-27 VITALS — BP 180/90 | HR 80 | Temp 98.1°F | Resp 18 | Ht 62.0 in | Wt 148.4 lb

## 2022-12-27 DIAGNOSIS — R739 Hyperglycemia, unspecified: Secondary | ICD-10-CM | POA: Diagnosis present

## 2022-12-27 DIAGNOSIS — E119 Type 2 diabetes mellitus without complications: Secondary | ICD-10-CM

## 2022-12-27 DIAGNOSIS — E1165 Type 2 diabetes mellitus with hyperglycemia: Secondary | ICD-10-CM | POA: Diagnosis not present

## 2022-12-27 DIAGNOSIS — Z7984 Long term (current) use of oral hypoglycemic drugs: Secondary | ICD-10-CM | POA: Insufficient documentation

## 2022-12-27 DIAGNOSIS — C49A2 Gastrointestinal stromal tumor of stomach: Secondary | ICD-10-CM

## 2022-12-27 LAB — CBC WITH DIFFERENTIAL (CANCER CENTER ONLY)
Abs Immature Granulocytes: 0.01 10*3/uL (ref 0.00–0.07)
Basophils Absolute: 0 10*3/uL (ref 0.0–0.1)
Basophils Relative: 1 %
Eosinophils Absolute: 0.1 10*3/uL (ref 0.0–0.5)
Eosinophils Relative: 1 %
HCT: 40.9 % (ref 39.0–52.0)
Hemoglobin: 13.8 g/dL (ref 13.0–17.0)
Immature Granulocytes: 0 %
Lymphocytes Relative: 40 %
Lymphs Abs: 2.6 10*3/uL (ref 0.7–4.0)
MCH: 29.6 pg (ref 26.0–34.0)
MCHC: 33.7 g/dL (ref 30.0–36.0)
MCV: 87.6 fL (ref 80.0–100.0)
Monocytes Absolute: 0.8 10*3/uL (ref 0.1–1.0)
Monocytes Relative: 12 %
Neutro Abs: 3.1 10*3/uL (ref 1.7–7.7)
Neutrophils Relative %: 46 %
Platelet Count: 551 10*3/uL — ABNORMAL HIGH (ref 150–400)
RBC: 4.67 MIL/uL (ref 4.22–5.81)
RDW: 14.6 % (ref 11.5–15.5)
WBC Count: 6.6 10*3/uL (ref 4.0–10.5)
nRBC: 0 % (ref 0.0–0.2)

## 2022-12-27 LAB — HEPATIC FUNCTION PANEL
ALT: 14 U/L (ref 0–44)
AST: 16 U/L (ref 15–41)
Albumin: 3.9 g/dL (ref 3.5–5.0)
Alkaline Phosphatase: 109 U/L (ref 38–126)
Bilirubin, Direct: 0.1 mg/dL (ref 0.0–0.2)
Indirect Bilirubin: 0.5 mg/dL (ref 0.3–0.9)
Total Bilirubin: 0.6 mg/dL (ref 0.3–1.2)
Total Protein: 8 g/dL (ref 6.5–8.1)

## 2022-12-27 LAB — CMP (CANCER CENTER ONLY)
ALT: 14 U/L (ref 0–44)
AST: 16 U/L (ref 15–41)
Albumin: 4 g/dL (ref 3.5–5.0)
Alkaline Phosphatase: 117 U/L (ref 38–126)
Anion gap: 8 (ref 5–15)
BUN: 21 mg/dL (ref 8–23)
CO2: 26 mmol/L (ref 22–32)
Calcium: 9 mg/dL (ref 8.9–10.3)
Chloride: 97 mmol/L — ABNORMAL LOW (ref 98–111)
Creatinine: 1.03 mg/dL (ref 0.61–1.24)
GFR, Estimated: >60 mL/min (ref >60)
Glucose, Bld: 497 mg/dL — ABNORMAL HIGH (ref 70–99)
Potassium: 4.1 mmol/L (ref 3.5–5.1)
Sodium: 131 mmol/L — ABNORMAL LOW (ref 135–145)
Total Bilirubin: 0.6 mg/dL (ref 0.3–1.2)
Total Protein: 8.1 g/dL (ref 6.5–8.1)

## 2022-12-27 LAB — CBC
HCT: 39 % (ref 39.0–52.0)
Hemoglobin: 13.5 g/dL (ref 13.0–17.0)
MCH: 29.9 pg (ref 26.0–34.0)
MCHC: 34.6 g/dL (ref 30.0–36.0)
MCV: 86.3 fL (ref 80.0–100.0)
Platelets: 526 10*3/uL — ABNORMAL HIGH (ref 150–400)
RBC: 4.52 MIL/uL (ref 4.22–5.81)
RDW: 14.4 % (ref 11.5–15.5)
WBC: 6.7 10*3/uL (ref 4.0–10.5)
nRBC: 0 % (ref 0.0–0.2)

## 2022-12-27 LAB — I-STAT VENOUS BLOOD GAS, ED
Acid-base deficit: 2 mmol/L (ref 0.0–2.0)
Bicarbonate: 22.3 mmol/L (ref 20.0–28.0)
Calcium, Ion: 1.14 mmol/L — ABNORMAL LOW (ref 1.15–1.40)
HCT: 41 % (ref 39.0–52.0)
Hemoglobin: 13.9 g/dL (ref 13.0–17.0)
O2 Saturation: 98 %
Potassium: 3.9 mmol/L (ref 3.5–5.1)
Sodium: 133 mmol/L — ABNORMAL LOW (ref 135–145)
TCO2: 23 mmol/L (ref 22–32)
pCO2, Ven: 34.8 mmHg — ABNORMAL LOW (ref 44–60)
pH, Ven: 7.415 (ref 7.25–7.43)
pO2, Ven: 109 mmHg — ABNORMAL HIGH (ref 32–45)

## 2022-12-27 LAB — CBG MONITORING, ED
Glucose-Capillary: 450 mg/dL — ABNORMAL HIGH (ref 70–99)
Glucose-Capillary: 262 mg/dL — ABNORMAL HIGH (ref 70–99)

## 2022-12-27 LAB — PHOSPHORUS: Phosphorus: 2.5 mg/dL (ref 2.5–4.6)

## 2022-12-27 LAB — URINALYSIS, ROUTINE W REFLEX MICROSCOPIC
Bacteria, UA: NONE SEEN
Bilirubin Urine: NEGATIVE
Glucose, UA: >1000 mg/dL — AB
Hgb urine dipstick: NEGATIVE
Ketones, ur: NEGATIVE mg/dL
Leukocytes,Ua: NEGATIVE
Nitrite: NEGATIVE
Protein, ur: NEGATIVE mg/dL
Specific Gravity, Urine: 1.034 — ABNORMAL HIGH (ref 1.005–1.030)
pH: 5.5 (ref 5.0–8.0)

## 2022-12-27 LAB — HEMOGLOBIN A1C
Hgb A1c MFr Bld: 8.8 % — ABNORMAL HIGH (ref 4.8–5.6)
Mean Plasma Glucose: 205.86 mg/dL

## 2022-12-27 LAB — LACTIC ACID, PLASMA: Lactic Acid, Venous: 1.5 mmol/L (ref 0.5–1.9)

## 2022-12-27 LAB — BASIC METABOLIC PANEL
Anion gap: 9 (ref 5–15)
BUN: 20 mg/dL (ref 8–23)
CO2: 23 mmol/L (ref 22–32)
Calcium: 9.2 mg/dL (ref 8.9–10.3)
Chloride: 97 mmol/L — ABNORMAL LOW (ref 98–111)
Creatinine, Ser: 0.9 mg/dL (ref 0.61–1.24)
GFR, Estimated: >60 mL/min (ref >60)
Glucose, Bld: 464 mg/dL — ABNORMAL HIGH (ref 70–99)
Potassium: 3.9 mmol/L (ref 3.5–5.1)
Sodium: 129 mmol/L — ABNORMAL LOW (ref 135–145)

## 2022-12-27 LAB — POCT CBG MONITORING: CBG: 523

## 2022-12-27 LAB — BETA-HYDROXYBUTYRIC ACID: Beta-Hydroxybutyric Acid: 0.1 mmol/L (ref 0.05–0.27)

## 2022-12-27 LAB — MAGNESIUM: Magnesium: 2.2 mg/dL (ref 1.7–2.4)

## 2022-12-27 MED ORDER — METFORMIN HCL 500 MG PO TABS
500.0000 mg | ORAL_TABLET | Freq: Two times a day (BID) | ORAL | 0 refills | Status: DC
  Filled 2022-12-27: qty 60, 30d supply, fill #0

## 2022-12-27 MED ORDER — LACTATED RINGERS IV SOLN: INTRAVENOUS | Status: DC

## 2022-12-27 MED ORDER — METFORMIN HCL 500 MG PO TABS
500.0000 mg | ORAL_TABLET | Freq: Once | ORAL | Status: AC
  Administered 2022-12-27: 500 mg via ORAL
  Filled 2022-12-27: qty 1

## 2022-12-27 NOTE — Telephone Encounter (Signed)
Noted  

## 2022-12-27 NOTE — ED Provider Notes (Signed)
[facility] Provider Note   CSN: [id number] Arrival date & time: 12/27/22  1358     History  Chief Complaint  Patient presents with   Hyperglycemia    Kevin PARENTEAU is a 72 y.o. male.  HPI Patient was seen by oncology today.  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  Patient was feeling well and asymptomatic.  He was noted to have elevated blood sugar greater than 400 at the office.  He was referred to the emergency department for evaluation.  Patient denies any symptoms.  He reports he is feeling well.  He is denying any pain.  Denying vomiting.  Denying shortness of breath.  Denying urinary frequency or pain.    Home Medications Prior to Admission medications   Medication Sig Start Date End Date Taking? Authorizing Provider  metFORMIN (GLUCOPHAGE) 500 MG tablet Take 1 tablet (500 mg total) by mouth 2 (two) times daily with a meal. 12/27/22  Yes Prestina Raigoza, Jeannie Done, MD  acetaminophen (TYLENOL) 500 MG tablet Take 1,000 mg by mouth every 6 (six) hours as needed for mild pain.    [provider]  amLODipine (NORVASC) 10 MG tablet Take 1 tablet (10 mg total) by mouth daily. 11/06/22   Ladell Pier, MD  docusate sodium (COLACE) 100 MG capsule Take 1 capsule (100 mg total) by mouth 2 (two) times daily. Patient not taking: Reported on 10/24/2022 06/05/22   Dwan Bolt, MD  imatinib (GLEEVEC) 400 MG tablet Take 1 tablet (400 mg total) by mouth daily. Take with meals and large glass of water.Caution:Chemotherapy. Patient taking differently: Take 200 mg by mouth daily. Take with meals and large glass of water.Caution:Chemotherapy. take 200 mg 09/20/22   Ladell Pier, MD  lisinopril (ZESTRIL) 10 MG tablet Take 1 tablet (10 mg total) by mouth daily. 11/06/22   Ladell Pier, MD  methocarbamol (ROBAXIN) 500 MG tablet Take 1 tablet (500 mg total) by mouth  every 6 (six) hours as needed for muscle spasms. 06/05/22   Dwan Bolt, MD  ondansetron (ZOFRAN-ODT) 4 MG disintegrating tablet Take 1 tablet (4 mg total) by mouth every 6 (six) hours as needed for nausea. 06/05/22   Dwan Bolt, MD  potassium chloride SA (KLOR-CON M) 20 MEQ tablet Take 1 tablet (20 mEq total) by mouth daily. 08/02/22   Owens Shark, NP      Allergies    Patient has no known allergies.    Review of Systems   Review of Systems  Physical Exam Updated Vital Signs BP (!) 173/89   Pulse (!) 58   Temp 97.8 F (36.6 C) (Oral)   Resp 16   Ht '5\' 2"'$  (1.575 m)   Wt 67.3 kg   SpO2 97%   BMI 27.14 kg/m  Physical Exam Constitutional:      Comments: Alert nontoxic well in appearance.  HENT:     Mouth/Throat:     Mouth: Mucous membranes are moist.     Pharynx: Oropharynx is clear.  Eyes:     Extraocular Movements: Extraocular movements intact.  Cardiovascular:     Rate and Rhythm: Normal rate and regular rhythm.  Pulmonary:     Effort: Pulmonary effort is normal.     Breath sounds: Normal breath sounds.  Abdominal:     General: There is no distension.     Palpations: Abdomen is soft.  Tenderness: There is no abdominal tenderness. There is no guarding.  Musculoskeletal:        General: No swelling or tenderness. Normal range of motion.     Right lower leg: No edema.     Left lower leg: No edema.  Skin:    General: Skin is warm and dry.  Neurological:     General: No focal deficit present.     Mental Status: He is oriented to person, place, and time.     Coordination: Coordination normal.  Psychiatric:        Mood and Affect: Mood normal.     ED Results / Procedures / Treatments   Labs (all labs ordered are listed, but only abnormal results are displayed) Labs Reviewed  BASIC METABOLIC PANEL - Abnormal; Notable for the following components:      Result Value   Sodium 129 (*)    Chloride 97 (*)    Glucose, Bld 464 (*)    All other components  within normal limits  CBC - Abnormal; Notable for the following components:   Platelets 526 (*)    All other components within normal limits  URINALYSIS, ROUTINE W REFLEX MICROSCOPIC - Abnormal; Notable for the following components:   Specific Gravity, Urine 1.034 (*)    Glucose, UA >1,000 (*)    All other components within normal limits  HEMOGLOBIN A1C - Abnormal; Notable for the following components:   Hgb A1c MFr Bld 8.8 (*)    All other components within normal limits  CBG MONITORING, ED - Abnormal; Notable for the following components:   Glucose-Capillary 450 (*)    All other components within normal limits  CBG MONITORING, ED - Abnormal; Notable for the following components:   Glucose-Capillary 262 (*)    All other components within normal limits  I-STAT VENOUS BLOOD GAS, ED - Abnormal; Notable for the following components:   pCO2, Ven 34.8 (*)    pO2, Ven 109 (*)    Sodium 133 (*)    Calcium, Ion 1.14 (*)    All other components within normal limits  HEPATIC FUNCTION PANEL  LACTIC ACID, PLASMA  MAGNESIUM  PHOSPHORUS  BETA-HYDROXYBUTYRIC ACID  LACTIC ACID, PLASMA  CBG MONITORING, ED  CBG MONITORING, ED    EKG None  Radiology No results found.  Procedures Procedures    Medications Ordered in ED Medications  lactated ringers infusion ( Intravenous New Bag/Given 12/27/22 1623)  metFORMIN (GLUCOPHAGE) tablet 500 mg (has no administration in time range)    ED Course/ Medical Decision Making/ A&P                             Medical Decision Making Amount and/or Complexity of Data Reviewed Labs: ordered.  Risk Prescription drug management.   Patient comes emergency department asymptomatic with elevated blood sugar outpatient greater than 400.  CBG in the emergency department 450.  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  This appears to be stable and being managed by Dr. Benay Spice on outpatient  basis.  Review of EMR indicates patient's blood sugars started periodically elevating last spring in May.  Some blood sugars in the 200s and then corrections.  By review of nonfasting blood sugars it appears patient was developing onset of diabetes over the past 9 to 10 months.  He has remained asymptomatic.  This appears to be the first time the blood sugars have been elevated  greater than 200s.  Will obtain hemoglobin A1c, venous blood gas and complete metabolic panel.  A1c 8.1.  Venous blood gas normal, beta hydroxy butyrate normal.  After volume resuscitation with 700 mL lactated Ringer's, blood sugar has gone down to 262.  Patient remains clinically well.  No tachycardia, clear mental status and no signs of acute endorgan damage.  Patient is stable to continue outpatient workup and management of newer onset type 2 diabetes.  I will start patient on 500 mg metformin twice daily.  Plan has been reviewed with the patient's son as well as results.  They are aware of the need for expedited follow-up and starting diet management and monitoring of diabetes.        Final Clinical Impression(s) / ED Diagnoses Final diagnoses:  New onset type 2 diabetes mellitus ([place])    Rx / DC Orders ED Discharge Orders          Ordered    metFORMIN (GLUCOPHAGE) 500 MG tablet  2 times daily with meals        12/27/22 2012              Charlesetta Shanks, MD 12/27/22 2023

## 2022-12-27 NOTE — ED Notes (Signed)
Discharge instructions discussed with pt via [place] interpreter.  Pt's family called, Janene Madeira, [phone]. They will pick pt up from ED lobby.  Pt verbalized understanding. Pt ambulatory to waiting room, NAD noted.

## 2022-12-27 NOTE — Telephone Encounter (Signed)
    Chief Complaint: Kevin [name] with Dr. Benay Spice reports pt.'s glucose is 497, 523. Asking for PCP to see pt. Pt. Needs to go to ED per Dr. Wynetta Emery.  Symptoms: Pt.is thirsty Frequency: Today Pertinent Negatives: Patient denies  Disposition: '[x]'$ ED /'[]'$ Urgent Care (no appt availability in office) / '[]'$ Appointment(In office/virtual)/ '[]'$  [facility]/ '[]'$ Home Care/ '[]'$ Refused Recommended Disposition /'[]'$ [facility]/ '[]'$  Follow-up with PCP Additional Notes:   Reason for Disposition  Blood glucose > 500 mg/dL (27.8 mmol/L)  Answer Assessment - Initial Assessment Questions 1. BLOOD GLUCOSE: "What is your blood glucose level?"      497, 523 2. ONSET: "When did you check the blood glucose?"     Today 3. USUAL RANGE: "What is your glucose level usually?" (e.g., usual fasting morning value, usual evening value)     N/a 4. KETONES: "Do you check for ketones (urine or blood test strips)?" If Yes, ask: "What does the test show now?"      No 5. TYPE 1 or 2:  "Do you know what type of diabetes you have?"  (e.g., Type 1, Type 2, Gestational; doesn't know)      No 6. INSULIN: "Do you take insulin?" "What type of insulin(s) do you use? What is the mode of delivery? (syringe, pen; injection or pump)?"      No 7. DIABETES PILLS: "Do you take any pills for your diabetes?" If Yes, ask: "Have you missed taking any pills recently?"     No 8. OTHER SYMPTOMS: "Do you have any symptoms?" (e.g., fever, frequent urination, difficulty breathing, dizziness, weakness, vomiting)     Thirsty 9. PREGNANCY: "Is there any chance you are pregnant?" "When was your last menstrual period?"     N/a  Protocols used: Diabetes - High Blood Sugar-A-AH

## 2022-12-27 NOTE — Progress Notes (Signed)
Called daughter, [name] to confirm medications. She reports he is taking Gleevec 200 mg/day--unable to tolerate the 400 mg /day. @ 1315 Lab glucose =497. Recheck w/CBG monitor = 523. Called to PCP office (Dr. Karle Plumber) w/[facility] to request he be seen today. Was instructed by PCP to go to emergency room. Patient and his son instructed to go to ER. Call to ER to report his coming. Escorted patient to his car and told son to take him to ER.

## 2022-12-27 NOTE — ED Triage Notes (Signed)
Patient here POV from PCP Office.  Endorses being at MD for Routine Follow-Up regarding Cancer Diagnosis and through BG Assessment it was noted that the BG was 497 ad CBG was 523.   Patient endorses No Pain, SOB, Polyuria, Polydipsia or any new Problems/Ailments.   NAD Noted during Triage. A&Ox4, GCS 15. Ambulatory.

## 2022-12-27 NOTE — Discharge Instructions (Addendum)
1.  Start metformin twice daily as prescribed. 2.  Follow dietary instructions for diabetes. 3.  Follow-up with your family doctor this upcoming week to continue management and treatment for diabetes. 4.  Return to the emergency department if you are feeling sick, lightheaded, nauseated, weak, sweaty or other concerning changes.

## 2022-12-27 NOTE — Progress Notes (Signed)
[facility] OFFICE PROGRESS NOTE   Diagnosis: Gastrointestinal stromal tumor  INTERVAL HISTORY:   Kevin [name] returns for a scheduled visit.  He reports feeling well.  No rash or diarrhea.  He reports discontinuing Gleevec approximately 3 weeks ago secondary to not feeling well.  No specific complaint.  He is drinking increased amounts of water.  No urinary frequency.  He reports eating candy 2 days ago.  He had coffee with sugar today.  Objective:  Vital signs in last 24 hours:  Blood pressure (!) 180/90, pulse 80, temperature 98.1 F (36.7 C), temperature source Oral, resp. rate 18, height '5\' 2"'$  (1.575 m), weight 148 lb 6.4 oz (67.3 kg), SpO2 98 %.    HEENT: No thrush or ulcers Resp: Lungs clear bilaterally Cardio: Regular rate and rhythm GI: No hepatomegaly, nontender Vascular: No leg edema, slight decrease in skin turgor?    Lab Results:  Lab Results  Component Value Date   WBC 6.6 12/27/2022   HGB 13.8 12/27/2022   HCT 40.9 12/27/2022   MCV 87.6 12/27/2022   PLT 551 (H) 12/27/2022   NEUTROABS 3.1 12/27/2022    CMP  Lab Results  Component Value Date   NA 131 (L) 12/27/2022   K 4.1 12/27/2022   CL 97 (L) 12/27/2022   CO2 26 12/27/2022   GLUCOSE 497 (H) 12/27/2022   BUN 21 12/27/2022   CREATININE 1.03 12/27/2022   CALCIUM 9.0 12/27/2022   PROT 8.1 12/27/2022   ALBUMIN 4.0 12/27/2022   AST 16 12/27/2022   ALT 14 12/27/2022   ALKPHOS 117 12/27/2022   BILITOT 0.6 12/27/2022   GFRNONAA >60 12/27/2022   GFRAA >60 09/01/2020    No results found for: "CEA1", "CEA", "ZYS063", "CA125"  Lab Results  Component Value Date   INR 1.2 06/05/2022   LABPROT 15.4 (H) 06/05/2022    Imaging:  No results found.  Medications: I have reviewed the patient's current medications.   Assessment/Plan: GIST of the stomach CT abdomen/pelvis 01/21/2020-large necrotic mass centered in the posterior wall of the stomach, irregular hypodense mass right lobe  liver  Upper endoscopy 01/24/2020-large infiltrative mass with no bleeding found in the posterior wall of the stomach.  Biopsy-gastrointestinal stromal tumor  MRI liver 01/28/2020-large exophytic centrally necrotic 17.3 x 12.3 cm gastric mass arising from posterior proximal stomach centered in the left upper quadrant with thick enhancing wall.  Complex multilocular 10.7 x 8.2 cm cystic liver mass right liver lobe.   Neoadjuvant Gleevec 01/28/2020 CT 03/03/2020, resolution of fluid component of right hepatic abscess, unchanged mass posterior to the gastric body, unchanged left periaortic and aortocaval adenopathy CT 06/01/2020-decreased size of a multilobulated mass arising from the posterior stomach, unchanged retroperitoneal lymph nodes, removal of liver drain resolution of abscess CT 09/14/20-no significant change in the large left upper quadrant mass arising from the proximal stomach.  Near complete resolution of right hepatic lobe abscess. CT 06/06/2021-no change in mass arising from the posterior gastric fundus, no lymphadenopathy or evidence of metastatic disease Patient discontinued Gleevec around September 2022 per his report CT 12/19/2021-significant interval increase in size of the large gastric mass which is partially necrotic, measures 16.5 x 14 cm compared to previous measurement of 9 x 7.6 cm.  Lumen of the stomach is significantly compressed.  Also mass-effect on the pancreas, spleen, left kidney and left adrenal gland.  No definite findings for direct invasion of these organs.  No findings of hepatic metastatic disease.  Stable small scattered mesenteric and  retroperitoneal lymph nodes. 12/25/2021 Gleevec 400 mg daily CT abdomen/pelvis 03/15/2022-interval 67% reduction in volume of the large left upper quadrant GI stromal tumor, scattered small retroperitoneal lymphadenopathy similar to prior CT abdomen/pelvis 04/15/2022-large exophytic mass arising off the posterior wall of the stomach noted and  compared to prior exam there has been a mild decrease in tumor volume, signs of internal necrosis, new gas identified within the dominant chronic component. 05/29/2022 resection of the gastric mass, partial gastrectomy, distal pancreatectomy and splenectomy by Dr. Zenia Resides. Pathology showed gastrointestinal stromal tumor with extensive necrosis measuring 4.7 x 13.2 x 8.1 cm; tumor arises within the stomach ulcerating mucosa and abuts the spleen and pancreas; mitotic rate 49; necrosis present comprising approximately 95% of the tumor; treatment effect present; all margins negative for tumor; 0 of 13 lymph nodes involved; risk assessment is high risk; pT4, pN0 Gleevec resumed 400 mg daily following surgery Gleevec discontinued per patient sometime between 09/12/2022 and today due to dizziness Gleevec resumed 200 mg daily beginning 10/25/2022, discontinued approximately 3 weeks prior to office visit 12/27/2022 per patient report 2. Liver lesion in the right lobe             -Liver mass consistent with abscess on MRI of the liver from 01/27/2020             -Status post CT-guided drainage of the liver abscess on 01/28/2020-culture positive for Streptococcus intermedius             -IV antibiotics and discharged home on 10-day course of Augmentin             -CT 03/03/2020-clearance of fluid component of liver abscess, unchanged mass posterior to the gastric body, stable left periaortic and aortocaval adenopathy             -Abscess drain removed 03/09/2020             -Liver abscess resolved on CT 06/01/2020 and 09/14/2020              3. Iron deficiency anemia-oral iron,  resolved Anemia on hospital admission 04/15/2022-likely secondary to sepsis and phlebotomy 4. Gastritis and H. pylori infection 5.  History of fever secondary to the liver abscess 6. Protein calorie malnutrition-resolved 7.  Hospital admission 04/15/2022-sepsis, blood culture positive for Staph epidermidis 8.  Marked hyperglycemia 12/27/2022       Disposition: Kevin [name] appears unchanged.  He is currently not taking Gleevec.  I recommended he resume Gleevec at a dose of 200 mg daily.  He has marked hyperglycemia today.  This may be in part due to the distal contact me.  We will contact his primary provider and request an appointment today for management of diabetes.  He will return for an office visit in 2 weeks.  Betsy Coder, MD  12/27/2022  12:49 PM

## 2022-12-27 NOTE — ED Triage Notes (Signed)
Dr. Carin Hock office (Onc) called to notify ED that pt's PCP instructed to send pt to ED. New finding of CBG 523. Has paperwork in hand. Pt speaks Spanish, not [place]. Son with pt Mariana Arn English, but does not speak [name].

## 2022-12-28 ENCOUNTER — Other Ambulatory Visit (HOSPITAL_COMMUNITY): Payer: Self-pay

## 2023-01-02 ENCOUNTER — Other Ambulatory Visit: Payer: Self-pay | Admitting: *Deleted

## 2023-01-02 ENCOUNTER — Other Ambulatory Visit (HOSPITAL_COMMUNITY): Payer: Self-pay

## 2023-01-02 MED ORDER — IMATINIB MESYLATE 100 MG PO TABS
200.0000 mg | ORAL_TABLET | Freq: Every day | ORAL | 1 refills | Status: AC
  Filled 2023-01-02 – 2023-03-04 (×2): qty 60, 30d supply, fill #0

## 2023-01-03 ENCOUNTER — Other Ambulatory Visit (HOSPITAL_COMMUNITY): Payer: Self-pay

## 2023-01-07 ENCOUNTER — Other Ambulatory Visit (HOSPITAL_COMMUNITY): Payer: Self-pay

## 2023-01-10 ENCOUNTER — Inpatient Hospital Stay: Payer: Medicare (Managed Care) | Attending: Oncology | Admitting: Nurse Practitioner

## 2023-01-10 ENCOUNTER — Encounter: Payer: Self-pay | Admitting: Nurse Practitioner

## 2023-01-10 ENCOUNTER — Inpatient Hospital Stay: Payer: Medicare (Managed Care)

## 2023-01-10 ENCOUNTER — Telehealth: Payer: Self-pay | Admitting: *Deleted

## 2023-01-10 ENCOUNTER — Ambulatory Visit: Payer: Self-pay

## 2023-01-10 ENCOUNTER — Other Ambulatory Visit: Payer: Self-pay | Admitting: Internal Medicine

## 2023-01-10 VITALS — BP 208/110 | HR 66 | Temp 98.2°F | Resp 18 | Ht 62.0 in | Wt 148.2 lb

## 2023-01-10 DIAGNOSIS — C49A2 Gastrointestinal stromal tumor of stomach: Secondary | ICD-10-CM

## 2023-01-10 DIAGNOSIS — R739 Hyperglycemia, unspecified: Secondary | ICD-10-CM | POA: Insufficient documentation

## 2023-01-10 DIAGNOSIS — R030 Elevated blood-pressure reading, without diagnosis of hypertension: Secondary | ICD-10-CM | POA: Diagnosis not present

## 2023-01-10 DIAGNOSIS — Z9081 Acquired absence of spleen: Secondary | ICD-10-CM | POA: Diagnosis not present

## 2023-01-10 LAB — CMP (CANCER CENTER ONLY)
ALT: 18 U/L (ref 0–44)
AST: 20 U/L (ref 15–41)
Albumin: 4.2 g/dL (ref 3.5–5.0)
Alkaline Phosphatase: 104 U/L (ref 38–126)
Anion gap: 9 (ref 5–15)
BUN: 19 mg/dL (ref 8–23)
CO2: 25 mmol/L (ref 22–32)
Calcium: 9.5 mg/dL (ref 8.9–10.3)
Chloride: 96 mmol/L — ABNORMAL LOW (ref 98–111)
Creatinine: 0.94 mg/dL (ref 0.61–1.24)
GFR, Estimated: >60 mL/min (ref >60)
Glucose, Bld: 469 mg/dL — ABNORMAL HIGH (ref 70–99)
Potassium: 4.2 mmol/L (ref 3.5–5.1)
Sodium: 130 mmol/L — ABNORMAL LOW (ref 135–145)
Total Bilirubin: 0.7 mg/dL (ref 0.3–1.2)
Total Protein: 8.4 g/dL — ABNORMAL HIGH (ref 6.5–8.1)

## 2023-01-10 LAB — CBC WITH DIFFERENTIAL (CANCER CENTER ONLY)
Abs Immature Granulocytes: 0 10*3/uL (ref 0.00–0.07)
Basophils Absolute: 0 10*3/uL (ref 0.0–0.1)
Basophils Relative: 0 %
Eosinophils Absolute: 0.1 10*3/uL (ref 0.0–0.5)
Eosinophils Relative: 1 %
HCT: 40.5 % (ref 39.0–52.0)
Hemoglobin: 13.8 g/dL (ref 13.0–17.0)
Immature Granulocytes: 0 %
Lymphocytes Relative: 51 %
Lymphs Abs: 3.4 10*3/uL (ref 0.7–4.0)
MCH: 29.5 pg (ref 26.0–34.0)
MCHC: 34.1 g/dL (ref 30.0–36.0)
MCV: 86.5 fL (ref 80.0–100.0)
Monocytes Absolute: 0.7 10*3/uL (ref 0.1–1.0)
Monocytes Relative: 10 %
Neutro Abs: 2.6 10*3/uL (ref 1.7–7.7)
Neutrophils Relative %: 38 %
Platelet Count: 403 10*3/uL — ABNORMAL HIGH (ref 150–400)
RBC: 4.68 MIL/uL (ref 4.22–5.81)
RDW: 14.5 % (ref 11.5–15.5)
WBC Count: 6.8 10*3/uL (ref 4.0–10.5)
nRBC: 0 % (ref 0.0–0.2)

## 2023-01-10 MED ORDER — ACCU-CHEK GUIDE VI STRP
ORAL_STRIP | 12 refills | Status: DC
  Filled 2023-01-10: qty 100, 30d supply, fill #0

## 2023-01-10 MED ORDER — ACCU-CHEK GUIDE W/DEVICE KIT
PACK | 0 refills | Status: DC
  Filled 2023-01-10: qty 1, 30d supply, fill #0

## 2023-01-10 MED ORDER — ACCU-CHEK SOFTCLIX LANCETS MISC
12 refills | Status: DC
  Filled 2023-01-10: qty 100, 30d supply, fill #0

## 2023-01-10 NOTE — Telephone Encounter (Signed)
Called PCP office with glucose reading today of 469. He never started his metformin and he is symptomatic. He has nothing to check his blood sugars at home. They are not able to see him, but has been provided an appointment on 01/13/23 at 3:30 pm with Dr. Wynetta Emery (written down for him). They will call in glucose meter to [facility]. His BP is very elevated as well at 208/110. With assistance of interpreter, risks of elevated BP and glucose were explained and he was told he needs to go to ER. He refuses to go. Daughter, [name] made aware of situation.

## 2023-01-10 NOTE — Telephone Encounter (Signed)
  Chief Complaint: hyperglycemia Symptoms: BS 469, DBP 110, pt very thirsty  Frequency: today during OV at [facility]  Pertinent Negatives: NA Disposition: '[]'$ ED /'[]'$ Urgent Care (no appt availability in office) / '[]'$ Appointment(In office/virtual)/ '[]'$  [facility]/ '[]'$ Home Care/ '[x]'$ Refused Recommended Disposition /'[]'$ [facility]/ '[]'$  Follow-up with PCP Additional Notes: Manuela Schwartz, RN calling to report above sx, labs checked and glucose came back at 469, was asking for OV since this happened previously on 12/27/22 and pt never had FU with provider. Pt has also not been taking metformin. Scheduled OV for 01/13/23 at 1540 with PCP. Advised with glucose elevated pt would need to go to ED. She asked if glucometer can be sent in for pt so he can check CBGs at home and was trying to contact daughter to find out which pharmacy but no answer. Also reported that DBP was > 110 was going to send pt to ED anyway. Pt was advised with translator present and refused to go to ED. Mariana Single, RN I would let PCP know.   Sent Evelena Asa message via teams about triage as well to pass along to provider.   Reason for Disposition  Blood glucose > 400 mg/dL (22.2 mmol/L)  Answer Assessment - Initial Assessment Questions 1. BLOOD GLUCOSE: "What is your blood glucose level?"      469 2. ONSET: "When did you check the blood glucose?"     Today at [place]: "What is your glucose level usually?" (e.g., usual fasting morning value, usual evening value)     Unsure doesn't have a meter  5. TYPE 1 or 2:  "Do you know what type of diabetes you have?"  (e.g., Type 1, Type 2, Gestational; doesn't know)      Type 2  6. INSULIN: "Do you take insulin?" "What type of insulin(s) do you use? What is the mode of delivery? (syringe, pen; injection or pump)?"      no 7. DIABETES PILLS: "Do you take any pills for your diabetes?" If Yes, ask: "Have you missed taking any pills recently?"      Metformin but hasn't been taking it  8. OTHER SYMPTOMS: "Do you have any symptoms?" (e.g., fever, frequent urination, difficulty breathing, dizziness, weakness, vomiting)     Thirsty  Protocols used: Diabetes - High Blood Sugar-A-AH

## 2023-01-10 NOTE — Telephone Encounter (Signed)
Call placed to patient to advise him to go to ED. Unable to reach or leave message on VM.

## 2023-01-10 NOTE — Progress Notes (Signed)
[place] OFFICE PROGRESS NOTE   Diagnosis: Gastrointestinal stromal tumor  INTERVAL HISTORY:   Kevin [name] returns as scheduled.  He had discontinued Gleevec about 3 weeks prior to his office visit 12/27/2022.  It was recommended he resume Gleevec 200 mg daily.  He was noted to have a blood sugar of nearly 500 on 12/27/2022.  He was referred to the emergency department.  He was discharged home on metformin 500 mg twice daily.  He is accompanied by a [name] interpreter.  He has a bottle of metformin with him.  He has not started it yet.  He is not aware how to check blood sugars.  He does not have a monitor to check his blood sugars.  He confirms he resumed [place] after the last office visit.  He denies nausea/vomiting.  No diarrhea.  No rash.  He continues to have excessive thirst.   Objective:  Vital signs in last 24 hours:  Blood pressure (!) 208/110, pulse 66, temperature 98.2 F (36.8 C), temperature source Oral, resp. rate 18, height '5\' 2"'$  (1.575 m), weight 148 lb 3.2 oz (67.2 kg), SpO2 99 %.    HEENT: No thrush or ulcers. Resp: Lungs clear bilaterally. Cardio: Regular rate and rhythm. GI: Abdomen soft and nontender.  No hepatosplenomegaly. Vascular: No leg edema. Skin: No rash.   Lab Results:  Lab Results  Component Value Date   WBC 6.8 01/10/2023   HGB 13.8 01/10/2023   HCT 40.5 01/10/2023   MCV 86.5 01/10/2023   PLT 403 (H) 01/10/2023   NEUTROABS 2.6 01/10/2023    Imaging:  No results found.  Medications: I have reviewed the patient's current medications.  Assessment/Plan: GIST of the stomach CT abdomen/pelvis 01/21/2020-large necrotic mass centered in the posterior wall of the stomach, irregular hypodense mass right lobe liver  Upper endoscopy 01/24/2020-large infiltrative mass with no bleeding found in the posterior wall of the stomach.  Biopsy-gastrointestinal stromal tumor  MRI liver 01/28/2020-large exophytic centrally necrotic 17.3 x  12.3 cm gastric mass arising from posterior proximal stomach centered in the left upper quadrant with thick enhancing wall.  Complex multilocular 10.7 x 8.2 cm cystic liver mass right liver lobe.   Neoadjuvant Gleevec 01/28/2020 CT 03/03/2020, resolution of fluid component of right hepatic abscess, unchanged mass posterior to the gastric body, unchanged left periaortic and aortocaval adenopathy CT 06/01/2020-decreased size of a multilobulated mass arising from the posterior stomach, unchanged retroperitoneal lymph nodes, removal of liver drain resolution of abscess CT 09/14/20-no significant change in the large left upper quadrant mass arising from the proximal stomach.  Near complete resolution of right hepatic lobe abscess. CT 06/06/2021-no change in mass arising from the posterior gastric fundus, no lymphadenopathy or evidence of metastatic disease Patient discontinued Gleevec around September 2022 per his report CT 12/19/2021-significant interval increase in size of the large gastric mass which is partially necrotic, measures 16.5 x 14 cm compared to previous measurement of 9 x 7.6 cm.  Lumen of the stomach is significantly compressed.  Also mass-effect on the pancreas, spleen, left kidney and left adrenal gland.  No definite findings for direct invasion of these organs.  No findings of hepatic metastatic disease.  Stable small scattered mesenteric and retroperitoneal lymph nodes. 12/25/2021 Gleevec 400 mg daily CT abdomen/pelvis 03/15/2022-interval 67% reduction in volume of the large left upper quadrant GI stromal tumor, scattered small retroperitoneal lymphadenopathy similar to prior CT abdomen/pelvis 04/15/2022-large exophytic mass arising off the posterior wall of the stomach noted and compared to  prior exam there has been a mild decrease in tumor volume, signs of internal necrosis, new gas identified within the dominant chronic component. 05/29/2022 resection of the gastric mass, partial gastrectomy, distal  pancreatectomy and splenectomy by Dr. Zenia Resides. Pathology showed gastrointestinal stromal tumor with extensive necrosis measuring 4.7 x 13.2 x 8.1 cm; tumor arises within the stomach ulcerating mucosa and abuts the spleen and pancreas; mitotic rate 49; necrosis present comprising approximately 95% of the tumor; treatment effect present; all margins negative for tumor; 0 of 13 lymph nodes involved; risk assessment is high risk; pT4, pN0 Gleevec resumed 400 mg daily following surgery Gleevec discontinued per patient sometime between 09/12/2022 and today due to dizziness Gleevec resumed 200 mg daily beginning 10/25/2022, discontinued approximately 3 weeks prior to office visit 12/27/2022 per patient report 2. Liver lesion in the right lobe             -Liver mass consistent with abscess on MRI of the liver from 01/27/2020             -Status post CT-guided drainage of the liver abscess on 01/28/2020-culture positive for Streptococcus intermedius             -IV antibiotics and discharged home on 10-day course of Augmentin             -CT 03/03/2020-clearance of fluid component of liver abscess, unchanged mass posterior to the gastric body, stable left periaortic and aortocaval adenopathy             -Abscess drain removed 03/09/2020             -Liver abscess resolved on CT 06/01/2020 and 09/14/2020              3. Iron deficiency anemia-oral iron,  resolved Anemia on hospital admission 04/15/2022-likely secondary to sepsis and phlebotomy 4. Gastritis and H. pylori infection 5.  History of fever secondary to the liver abscess 6. Protein calorie malnutrition-resolved 7.  Hospital admission 04/15/2022-sepsis, blood culture positive for Staph epidermidis 8.  Marked hyperglycemia 12/27/2022-metformin 500 mg twice daily prescribed; blood sugar markedly elevated to 01/28/2023.  It does not appear he has started metformin 500 mg twice daily.    Disposition: Kevin [name] appears unchanged.  He will continue Gleevec 200 mg  daily.  It is not clear if he has started metformin as prescribed 12/27/2022.  Blood sugar today remains markedly elevated, 469.  Blood pressure is also markedly elevated.  We have contacted his PCP.  The recommendation is to send him to the emergency room for treatment of the high blood sugar and also elevated blood pressure.  I reviewed this recommendation with him.  He declines evaluation in the emergency department.  We discussed potential risks to him up to and including death.  He expresses understanding.  We were able to schedule him an appointment with his PCP on 01/13/2023.  In the meantime he will work on staying hydrated and avoiding concentrated sweets.  We sent a prescription to his pharmacy for a glucometer.  He will make sure he is taking metformin and blood pressure medications.  He will return for follow-up here in approximately 3 weeks.  Plan reviewed with Dr. Benay Spice.    Ned Card ANP/GNP-BC   01/10/2023  2:54 PM

## 2023-01-10 NOTE — Telephone Encounter (Signed)
This encounter was created in error - please disregard.

## 2023-01-13 ENCOUNTER — Other Ambulatory Visit (HOSPITAL_COMMUNITY): Payer: Self-pay

## 2023-01-13 ENCOUNTER — Ambulatory Visit: Payer: Medicare (Managed Care) | Admitting: Internal Medicine

## 2023-01-14 ENCOUNTER — Other Ambulatory Visit (HOSPITAL_COMMUNITY): Payer: Self-pay

## 2023-01-14 ENCOUNTER — Other Ambulatory Visit: Payer: Self-pay

## 2023-01-15 ENCOUNTER — Other Ambulatory Visit: Payer: Self-pay | Admitting: Pharmacist

## 2023-01-15 ENCOUNTER — Other Ambulatory Visit (HOSPITAL_COMMUNITY): Payer: Self-pay

## 2023-01-15 ENCOUNTER — Other Ambulatory Visit: Payer: Self-pay

## 2023-01-15 MED ORDER — ONETOUCH DELICA PLUS LANCET33G MISC
0 refills | Status: DC
  Filled 2023-01-15: qty 100, 90d supply, fill #0

## 2023-01-15 MED ORDER — ONETOUCH VERIO VI STRP
ORAL_STRIP | 0 refills | Status: DC
  Filled 2023-01-15: qty 100, 90d supply, fill #0

## 2023-01-15 MED ORDER — ONETOUCH VERIO W/DEVICE KIT
PACK | 0 refills | Status: DC
  Filled 2023-01-15: qty 1, 90d supply, fill #0

## 2023-01-16 ENCOUNTER — Other Ambulatory Visit: Payer: Self-pay

## 2023-01-16 ENCOUNTER — Other Ambulatory Visit (HOSPITAL_COMMUNITY): Payer: Self-pay

## 2023-01-17 ENCOUNTER — Other Ambulatory Visit: Payer: Self-pay

## 2023-01-24 ENCOUNTER — Other Ambulatory Visit (HOSPITAL_COMMUNITY): Payer: Self-pay

## 2023-01-29 ENCOUNTER — Other Ambulatory Visit (HOSPITAL_COMMUNITY): Payer: Self-pay

## 2023-02-05 ENCOUNTER — Other Ambulatory Visit (HOSPITAL_COMMUNITY): Payer: Self-pay

## 2023-02-13 ENCOUNTER — Other Ambulatory Visit (HOSPITAL_COMMUNITY): Payer: Self-pay

## 2023-02-17 ENCOUNTER — Inpatient Hospital Stay: Payer: Medicare (Managed Care) | Admitting: Oncology

## 2023-02-17 ENCOUNTER — Inpatient Hospital Stay: Payer: Medicare (Managed Care) | Attending: Oncology

## 2023-02-18 ENCOUNTER — Encounter: Payer: Self-pay | Admitting: *Deleted

## 2023-02-18 NOTE — Progress Notes (Signed)
Patient was "no show" for lab/OV. Scheduling message sent to reschedule for 1st available.

## 2023-03-04 ENCOUNTER — Other Ambulatory Visit: Payer: Self-pay

## 2023-03-04 ENCOUNTER — Other Ambulatory Visit (HOSPITAL_COMMUNITY): Payer: Self-pay

## 2023-03-13 ENCOUNTER — Other Ambulatory Visit (HOSPITAL_COMMUNITY): Payer: Self-pay

## 2023-03-17 ENCOUNTER — Telehealth: Payer: Self-pay | Admitting: Pharmacist

## 2023-03-17 ENCOUNTER — Other Ambulatory Visit (HOSPITAL_COMMUNITY): Payer: Self-pay

## 2023-03-17 ENCOUNTER — Inpatient Hospital Stay: Payer: Medicare (Managed Care) | Admitting: Nurse Practitioner

## 2023-03-17 ENCOUNTER — Other Ambulatory Visit: Payer: Self-pay | Admitting: Internal Medicine

## 2023-03-17 ENCOUNTER — Encounter: Payer: Self-pay | Admitting: Nurse Practitioner

## 2023-03-17 ENCOUNTER — Other Ambulatory Visit: Payer: Self-pay

## 2023-03-17 ENCOUNTER — Inpatient Hospital Stay: Payer: Medicare (Managed Care) | Attending: Oncology

## 2023-03-17 VITALS — BP 158/89 | HR 79 | Temp 98.2°F | Resp 18 | Ht 62.0 in | Wt 135.8 lb

## 2023-03-17 DIAGNOSIS — C49A2 Gastrointestinal stromal tumor of stomach: Secondary | ICD-10-CM | POA: Insufficient documentation

## 2023-03-17 DIAGNOSIS — I10 Essential (primary) hypertension: Secondary | ICD-10-CM

## 2023-03-17 LAB — CBC WITH DIFFERENTIAL (CANCER CENTER ONLY)
Abs Immature Granulocytes: 0.02 10*3/uL (ref 0.00–0.07)
Basophils Absolute: 0 10*3/uL (ref 0.0–0.1)
Basophils Relative: 1 %
Eosinophils Absolute: 0.1 10*3/uL (ref 0.0–0.5)
Eosinophils Relative: 1 %
HCT: 40.5 % (ref 39.0–52.0)
Hemoglobin: 14 g/dL (ref 13.0–17.0)
Immature Granulocytes: 0 %
Lymphocytes Relative: 49 %
Lymphs Abs: 3.2 10*3/uL (ref 0.7–4.0)
MCH: 30.5 pg (ref 26.0–34.0)
MCHC: 34.6 g/dL (ref 30.0–36.0)
MCV: 88.2 fL (ref 80.0–100.0)
Monocytes Absolute: 0.7 10*3/uL (ref 0.1–1.0)
Monocytes Relative: 10 %
Neutro Abs: 2.5 10*3/uL (ref 1.7–7.7)
Neutrophils Relative %: 39 %
Platelet Count: 407 10*3/uL — ABNORMAL HIGH (ref 150–400)
RBC: 4.59 MIL/uL (ref 4.22–5.81)
RDW: 14.2 % (ref 11.5–15.5)
WBC Count: 6.5 10*3/uL (ref 4.0–10.5)
nRBC: 0 % (ref 0.0–0.2)

## 2023-03-17 LAB — CMP (CANCER CENTER ONLY)
ALT: 20 U/L (ref 0–44)
AST: 20 U/L (ref 15–41)
Albumin: 4.3 g/dL (ref 3.5–5.0)
Alkaline Phosphatase: 119 U/L (ref 38–126)
Anion gap: 10 (ref 5–15)
BUN: 16 mg/dL (ref 8–23)
CO2: 24 mmol/L (ref 22–32)
Calcium: 9.6 mg/dL (ref 8.9–10.3)
Chloride: 98 mmol/L (ref 98–111)
Creatinine: 0.75 mg/dL (ref 0.61–1.24)
GFR, Estimated: >60 mL/min (ref >60)
Glucose, Bld: 354 mg/dL — ABNORMAL HIGH (ref 70–99)
Potassium: 3.9 mmol/L (ref 3.5–5.1)
Sodium: 132 mmol/L — ABNORMAL LOW (ref 135–145)
Total Bilirubin: 0.8 mg/dL (ref 0.3–1.2)
Total Protein: 7.7 g/dL (ref 6.5–8.1)

## 2023-03-17 NOTE — Telephone Encounter (Signed)
Reached out the the patient's daughter Leavy Cella. She said they maybe able to afford the cost from [employer] but she was not certain. She will make an account and call me back to tell me when that is complete so the prescription can be sent to [facility].   Of note, gave Leavy Cella the information for [facility] Assistance for [facility] Access. Per [facility] online information, "patients will be able to connect in-person with [facility] DSS Medicaid Eligibility and Enrollment Specialists at [facility] fourth-floor [address] on weekdays between 8 a.m. and 5 p.m. ([address] [place]. [place] in [place]). With no appointment necessary, individuals can simply walk in and receive assistance from one of the three dedicated caseworkers available." Encourage [name] to see if this was an option for her father.

## 2023-03-17 NOTE — Telephone Encounter (Signed)
Oral Chemotherapy Pharmacist Encounter   Received notification from the office patient patient reported at his appt that he was having trouble affording his imatinib.   The medication is expensive through his insurance and Romeo Apple (patient advocate) called the patient's daughter Leavy Cella on 3/26 about [facility] discount pricing of $64.80/month. She said she would pick up the medication from [facility] at that price, it does not look like they did.   In the past when we tried to get him set-up with the cancer center grant, but to my knowledge Delice Bison was never able to get the information she needed from the daughter to apply.   Looking online now, it looks like they may be able to get it from cheaper from [facility] pharmacy (~$25-$30/month), but that would require them to make an online account for ordering the medication and paying online.    Remi Haggard, PharmD, BCPS, BCOP, CPP Hematology/Oncology Clinical Pharmacist [place]/DB/AP Oral Chemotherapy Navigation [facility] [phone]  03/17/2023 12:50 PM

## 2023-03-17 NOTE — Progress Notes (Signed)
[facility] OFFICE PROGRESS NOTE   Diagnosis: Gastrointestinal stromal tumor  INTERVAL HISTORY:   Kevin [name] returns for follow-up.  He is accompanied by his son and a Spanish interpreter.  He reports running out of Gleevec and unable to refill due to cost.  He is also out of his other medications.  He is currently living in a hotel.  He notes that is hard to cook in the hotel and feels this is why he has lost weight.  No abdominal pain.  No nausea or vomiting.  No diarrhea.  Objective:  Vital signs in last 24 hours:  Blood pressure (!) 158/89, pulse 79, temperature 98.2 F (36.8 C), temperature source Oral, resp. rate 18, height 5\' 2"  (1.575 m), weight 135 lb 12.8 oz (61.6 kg), SpO2 98 %.    HEENT: No thrush or ulcers. Resp: Lungs clear bilaterally. Cardio: Regular rate and rhythm. GI: Abdomen soft and nontender.  No mass.  No hepatomegaly. Vascular: No leg edema. Skin: No rash.   Lab Results:  Lab Results  Component Value Date   WBC 6.5 03/17/2023   HGB 14.0 03/17/2023   HCT 40.5 03/17/2023   MCV 88.2 03/17/2023   PLT 407 (H) 03/17/2023   NEUTROABS 2.5 03/17/2023    Imaging:  No results found.  Medications: I have reviewed the patient's current medications.  Assessment/Plan: GIST of the stomach CT abdomen/pelvis 01/21/2020-large necrotic mass centered in the posterior wall of the stomach, irregular hypodense mass right lobe liver  Upper endoscopy 01/24/2020-large infiltrative mass with no bleeding found in the posterior wall of the stomach.  Biopsy-gastrointestinal stromal tumor  MRI liver 01/28/2020-large exophytic centrally necrotic 17.3 x 12.3 cm gastric mass arising from posterior proximal stomach centered in the left upper quadrant with thick enhancing wall.  Complex multilocular 10.7 x 8.2 cm cystic liver mass right liver lobe.   Neoadjuvant Gleevec 01/28/2020 CT 03/03/2020, resolution of fluid component of right hepatic abscess, unchanged mass  posterior to the gastric body, unchanged left periaortic and aortocaval adenopathy CT 06/01/2020-decreased size of a multilobulated mass arising from the posterior stomach, unchanged retroperitoneal lymph nodes, removal of liver drain resolution of abscess CT 09/14/20-no significant change in the large left upper quadrant mass arising from the proximal stomach.  Near complete resolution of right hepatic lobe abscess. CT 06/06/2021-no change in mass arising from the posterior gastric fundus, no lymphadenopathy or evidence of metastatic disease Patient discontinued Gleevec around September 2022 per his report CT 12/19/2021-significant interval increase in size of the large gastric mass which is partially necrotic, measures 16.5 x 14 cm compared to previous measurement of 9 x 7.6 cm.  Lumen of the stomach is significantly compressed.  Also mass-effect on the pancreas, spleen, left kidney and left adrenal gland.  No definite findings for direct invasion of these organs.  No findings of hepatic metastatic disease.  Stable small scattered mesenteric and retroperitoneal lymph nodes. 12/25/2021 Gleevec 400 mg daily CT abdomen/pelvis 03/15/2022-interval 67% reduction in volume of the large left upper quadrant GI stromal tumor, scattered small retroperitoneal lymphadenopathy similar to prior CT abdomen/pelvis 04/15/2022-large exophytic mass arising off the posterior wall of the stomach noted and compared to prior exam there has been a mild decrease in tumor volume, signs of internal necrosis, new gas identified within the dominant chronic component. 05/29/2022 resection of the gastric mass, partial gastrectomy, distal pancreatectomy and splenectomy by Dr. Freida Busman. Pathology showed gastrointestinal stromal tumor with extensive necrosis measuring 4.7 x 13.2 x 8.1 cm; tumor arises  within the stomach ulcerating mucosa and abuts the spleen and pancreas; mitotic rate 49; necrosis present comprising approximately 95% of the tumor;  treatment effect present; all margins negative for tumor; 0 of 13 lymph nodes involved; risk assessment is high risk; pT4, pN0 Gleevec resumed 400 mg daily following surgery Gleevec discontinued per patient sometime between 09/12/2022 and today due to dizziness Gleevec resumed 200 mg daily beginning 10/25/2022, discontinued approximately 3 weeks prior to office visit 12/27/2022 per patient report 03/17/2023 not taking Gleevec 2. Liver lesion in the right lobe             -Liver mass consistent with abscess on MRI of the liver from 01/27/2020             -Status post CT-guided drainage of the liver abscess on 01/28/2020-culture positive for Streptococcus intermedius             -IV antibiotics and discharged home on 10-day course of Augmentin             -CT 03/03/2020-clearance of fluid component of liver abscess, unchanged mass posterior to the gastric body, stable left periaortic and aortocaval adenopathy             -Abscess drain removed 03/09/2020             -Liver abscess resolved on CT 06/01/2020 and 09/14/2020              3. Iron deficiency anemia-oral iron,  resolved Anemia on hospital admission 04/15/2022-likely secondary to sepsis and phlebotomy 4. Gastritis and H. pylori infection 5.  History of fever secondary to the liver abscess 6. Protein calorie malnutrition-resolved 7.  Hospital admission 04/15/2022-sepsis, blood culture positive for Staph epidermidis 8.  Marked hyperglycemia 12/27/2022-metformin 500 mg twice daily prescribed; blood sugar markedly elevated to 01/28/2023.  It does not appear he has started metformin 500 mg twice daily.  Disposition: Kevin [name] appears unchanged.  Due to financial difficulty he has not been taking prescribed medications including Gleevec.  We will contact the Cancer Center oral chemotherapy pharmacist for assistance.  We will also call the [facility] outpatient pharmacy.  We have called his PCP for assistance with other medications.  We made a referral to the  Cancer Center social worker regarding his housing situation.  He will return for follow-up in 2 weeks.    Lonna Cobb ANP/GNP-BC   03/17/2023  12:07 PM

## 2023-03-18 ENCOUNTER — Telehealth: Payer: Self-pay

## 2023-03-18 ENCOUNTER — Inpatient Hospital Stay: Payer: Medicare (Managed Care) | Admitting: Licensed Clinical Social Worker

## 2023-03-18 ENCOUNTER — Other Ambulatory Visit (HOSPITAL_COMMUNITY): Payer: Self-pay

## 2023-03-18 NOTE — Telephone Encounter (Signed)
Requested medication (s) are due for refill today - yes  Requested medication (s) are on the active medication list -yes  Future visit scheduled -no  Last refill: lisinopril-11/06/22 #39                 Metformin- 12/27/22 #60  Notes to clinic: Attempted to call patient- no answer unable to leave message- interpreter: [id number]#[id number]. Courtesy RF have been given  Requested Prescriptions  Pending Prescriptions Disp Refills   amLODipine (NORVASC) 10 MG tablet 30 tablet 0    Sig: Take 1 tablet (10 mg total) by mouth daily.     Cardiovascular: Calcium Channel Blockers 2 Failed - 03/17/2023 12:06 PM      Failed - Last BP in normal range    BP Readings from Last 1 Encounters:  03/17/23 (!) 158/89         Failed - Valid encounter within last 6 months    Recent Outpatient Visits           1 year ago Essential hypertension   [place] [facility] [place] Gavin Poundeborah B, MD   1 year ago Encounter to establish care   [facility] [place] MatarJohnson, Deborah B, MD              Passed - Last Heart Rate in normal range    Pulse Readings from Last 1 Encounters:  03/17/23 79          metFORMIN (GLUCOPHAGE) 500 MG tablet 60 tablet 0    Sig: Take 1 tablet (500 mg total) by mouth 2 (two) times daily with a meal.     Endocrinology:  Diabetes - Biguanides Failed - 03/17/2023 12:06 PM      Failed - HBA1C is between 0 and 7.9 and within 180 days    Hgb A1c MFr Bld  Date Value Ref Range Status  12/27/2022 8.8 (H) 4.8 - 5.6 % Final    Comment:    (NOTE) Pre diabetes:          5.7%-6.4%  Diabetes:              >6.4%  Glycemic control for   <7.0% adults with diabetes          Failed - B12 Level in normal range and within 720 days    Vitamin B-12  Date Value Ref Range Status  01/26/2020 1,177 (H) 180 - 914 pg/mL Final    Comment:    (NOTE) This assay is not validated for testing neonatal or myeloproliferative syndrome specimens for Vitamin  B12 levels. Performed at [facility], [address] [place], [place] [address]          Failed - Valid encounter within last 6 months    Recent Outpatient Visits           1 year ago Essential hypertension   [place] [facility]ine MatarJohnson, Deborah B, MD   1 year ago Encounter to establish care   [facility]ine Matar[facility], Deborah B, MD              Passed - Cr in normal range and within 360 days    Creatinine  Date Value Ref Range Status  03/17/2023 0.75 0.61 - 1.24 mg/dL Final         Passed - eGFR in normal range and within 360 days    GFR, Est AFR Am  Date  Value Ref Range Status  09/01/2020 >60 >60 mL/min Final   GFR, Estimated  Date Value Ref Range Status  03/17/2023 >60 >60 mL/min Final    Comment:    (NOTE) Calculated using the CKD-EPI Creatinine Equation (2021)          Passed - CBC within normal limits and completed in the last 12 months    WBC  Date Value Ref Range Status  12/27/2022 6.7 4.0 - 10.5 K/uL Final   WBC Count  Date Value Ref Range Status  03/17/2023 6.5 4.0 - 10.5 K/uL Final   RBC  Date Value Ref Range Status  03/17/2023 4.59 4.22 - 5.81 MIL/uL Final   Hemoglobin  Date Value Ref Range Status  03/17/2023 14.0 13.0 - 17.0 g/dL Final   HCT  Date Value Ref Range Status  03/17/2023 40.5 39.0 - 52.0 % Final   MCHC  Date Value Ref Range Status  03/17/2023 34.6 30.0 - 36.0 g/dL Final   [facility]  Date Value Ref Range Status  03/17/2023 30.5 26.0 - 34.0 pg Final   MCV  Date Value Ref Range Status  03/17/2023 88.2 80.0 - 100.0 fL Final   No results found for: "PLTCOUNTKUC", "LABPLAT", "POCPLA" RDW  Date Value Ref Range Status  03/17/2023 14.2 11.5 - 15.5 % Final            Requested Prescriptions  Pending Prescriptions Disp Refills   amLODipine (NORVASC) 10 MG tablet 30 tablet 0    Sig: Take 1 tablet (10 mg total) by mouth daily.     Cardiovascular: Calcium  Channel Blockers 2 Failed - 03/17/2023 12:06 PM      Failed - Last BP in normal range    BP Readings from Last 1 Encounters:  03/17/23 (!) 158/89         Failed - Valid encounter within last 6 months    Recent Outpatient Visits           1 year ago Essential hypertension   [place] [facility] [place], Gavin Pound B, MD   1 year ago Encounter to establish care   [place] [facility] Marcine Matar, MD              Passed - Last Heart Rate in normal range    Pulse Readings from Last 1 Encounters:  03/17/23 79          metFORMIN (GLUCOPHAGE) 500 MG tablet 60 tablet 0    Sig: Take 1 tablet (500 mg total) by mouth 2 (two) times daily with a meal.     Endocrinology:  Diabetes - Biguanides Failed - 03/17/2023 12:06 PM      Failed - HBA1C is between 0 and 7.9 and within 180 days    Hgb A1c MFr Bld  Date Value Ref Range Status  12/27/2022 8.8 (H) 4.8 - 5.6 % Final    Comment:    (NOTE) Pre diabetes:          5.7%-6.4%  Diabetes:              >6.4%  Glycemic control for   <7.0% adults with diabetes          Failed - B12 Level in normal range and within 720 days    Vitamin B-12  Date Value Ref Range Status  01/26/2020 1,177 (H) 180 - 914 pg/mL Final    Comment:    (NOTE) This assay is not validated for  testing neonatal or myeloproliferative syndrome specimens for Vitamin B12 levels. Performed at [facility], [address], [place], [place] [address]          Failed - Valid encounter within last 6 months    Recent Outpatient Visits           1 year ago Essential hypertension   [place] [facility] Jonah Blue B, MD   1 year ago Encounter to establish care   [place] [facility] Marcine Matar, MD              Passed - Cr in normal range and within 360 days    Creatinine  Date Value Ref Range Status  03/17/2023 0.75 0.61 - 1.24 mg/dL  Final         Passed - eGFR in normal range and within 360 days    GFR, Est AFR Am  Date Value Ref Range Status  09/01/2020 >60 >60 mL/min Final   GFR, Estimated  Date Value Ref Range Status  03/17/2023 >60 >60 mL/min Final    Comment:    (NOTE) Calculated using the CKD-EPI Creatinine Equation (2021)          Passed - CBC within normal limits and completed in the last 12 months    WBC  Date Value Ref Range Status  12/27/2022 6.7 4.0 - 10.5 K/uL Final   WBC Count  Date Value Ref Range Status  03/17/2023 6.5 4.0 - 10.5 K/uL Final   RBC  Date Value Ref Range Status  03/17/2023 4.59 4.22 - 5.81 MIL/uL Final   Hemoglobin  Date Value Ref Range Status  03/17/2023 14.0 13.0 - 17.0 g/dL Final   HCT  Date Value Ref Range Status  03/17/2023 40.5 39.0 - 52.0 % Final   MCHC  Date Value Ref Range Status  03/17/2023 34.6 30.0 - 36.0 g/dL Final   [facility]  Date Value Ref Range Status  03/17/2023 30.5 26.0 - 34.0 pg Final   MCV  Date Value Ref Range Status  03/17/2023 88.2 80.0 - 100.0 fL Final   No results found for: "PLTCOUNTKUC", "LABPLAT", "POCPLA" RDW  Date Value Ref Range Status  03/17/2023 14.2 11.5 - 15.5 % Final

## 2023-03-18 NOTE — Telephone Encounter (Signed)
During a routine visit, Kevin [name] indicated that he is currently out of all his medications. I contacted the specialty pharmacy at [facility] and spoke with Alycia Rossetti, who informed me that the patient has a bill of [phone] but can still pick up his Gleevec medication. Additionally, I spoke with his pharmacy and verified that there is no cost for his hypertension medication. I instructed the pharmacy to prepare his hypertension medication and advised the patient that he can pick it up today. The patient's son confirmed understanding and did not have any further questions. I strongly recommended that the patient begin taking his hypertension medication without delay.

## 2023-03-18 NOTE — Progress Notes (Signed)
[facility] CSW Progress Note  Clinical Child psychotherapist contacted caregiver by phone to assess needs.  Received the referral from nurse that patient was not taking his BP medication and immunotherapy medication due to cost.  Spoke with patient's son, Kevin [name], and gave him the phone numbers for [employer] and [employer].  The nurse had already confirmed with the [facility] pharmacy and told Kevin [name] that he could obtain the medication and work with the pharmacy regarding the cost.  He stated he understood.  CSW to follow up with son to discuss available grants.    Pascal Lux Zoeie Ritter, LCSW

## 2023-03-19 ENCOUNTER — Other Ambulatory Visit (HOSPITAL_COMMUNITY): Payer: Self-pay

## 2023-03-20 ENCOUNTER — Other Ambulatory Visit: Payer: Self-pay

## 2023-03-21 ENCOUNTER — Other Ambulatory Visit: Payer: Self-pay

## 2023-03-31 ENCOUNTER — Other Ambulatory Visit (HOSPITAL_COMMUNITY): Payer: Self-pay

## 2023-04-03 ENCOUNTER — Ambulatory Visit: Payer: Medicare (Managed Care) | Attending: Physician Assistant | Admitting: Physician Assistant

## 2023-04-03 ENCOUNTER — Inpatient Hospital Stay (HOSPITAL_BASED_OUTPATIENT_CLINIC_OR_DEPARTMENT_OTHER): Payer: Medicare (Managed Care) | Admitting: Nurse Practitioner

## 2023-04-03 ENCOUNTER — Inpatient Hospital Stay: Payer: Medicare (Managed Care)

## 2023-04-03 ENCOUNTER — Encounter: Payer: Self-pay | Admitting: Nurse Practitioner

## 2023-04-03 ENCOUNTER — Other Ambulatory Visit: Payer: Self-pay

## 2023-04-03 ENCOUNTER — Other Ambulatory Visit (HOSPITAL_COMMUNITY): Payer: Self-pay

## 2023-04-03 ENCOUNTER — Encounter: Payer: Self-pay | Admitting: Physician Assistant

## 2023-04-03 ENCOUNTER — Telehealth: Payer: Self-pay

## 2023-04-03 ENCOUNTER — Ambulatory Visit: Payer: Self-pay | Admitting: *Deleted

## 2023-04-03 VITALS — BP 171/88 | HR 78 | Temp 98.1°F | Resp 18 | Ht 62.0 in | Wt 132.7 lb

## 2023-04-03 DIAGNOSIS — R739 Hyperglycemia, unspecified: Secondary | ICD-10-CM | POA: Diagnosis not present

## 2023-04-03 DIAGNOSIS — E1165 Type 2 diabetes mellitus with hyperglycemia: Secondary | ICD-10-CM

## 2023-04-03 DIAGNOSIS — Z91199 Patient's noncompliance with other medical treatment and regimen due to unspecified reason: Secondary | ICD-10-CM | POA: Diagnosis not present

## 2023-04-03 DIAGNOSIS — Z794 Long term (current) use of insulin: Secondary | ICD-10-CM

## 2023-04-03 DIAGNOSIS — C49A2 Gastrointestinal stromal tumor of stomach: Secondary | ICD-10-CM

## 2023-04-03 DIAGNOSIS — I10 Essential (primary) hypertension: Secondary | ICD-10-CM

## 2023-04-03 LAB — POCT URINALYSIS DIP (CLINITEK)
Bilirubin, UA: NEGATIVE
Blood, UA: NEGATIVE
Glucose, UA: 500 mg/dL — AB
Ketones, POC UA: NEGATIVE mg/dL
Leukocytes, UA: NEGATIVE
Nitrite, UA: NEGATIVE
POC PROTEIN,UA: NEGATIVE
Spec Grav, UA: <=1.005 — AB (ref 1.010–1.025)
Urobilinogen, UA: 0.2 E.U./dL
pH, UA: 6 (ref 5.0–8.0)

## 2023-04-03 LAB — CBC WITH DIFFERENTIAL (CANCER CENTER ONLY)
Abs Immature Granulocytes: 0.01 10*3/uL (ref 0.00–0.07)
Basophils Absolute: 0 10*3/uL (ref 0.0–0.1)
Basophils Relative: 0 %
Eosinophils Absolute: 0.1 10*3/uL (ref 0.0–0.5)
Eosinophils Relative: 1 %
HCT: 38 % — ABNORMAL LOW (ref 39.0–52.0)
Hemoglobin: 13 g/dL (ref 13.0–17.0)
Immature Granulocytes: 0 %
Lymphocytes Relative: 38 %
Lymphs Abs: 2.3 10*3/uL (ref 0.7–4.0)
MCH: 30.6 pg (ref 26.0–34.0)
MCHC: 34.2 g/dL (ref 30.0–36.0)
MCV: 89.4 fL (ref 80.0–100.0)
Monocytes Absolute: 0.6 10*3/uL (ref 0.1–1.0)
Monocytes Relative: 10 %
Neutro Abs: 3.1 10*3/uL (ref 1.7–7.7)
Neutrophils Relative %: 51 %
Platelet Count: 338 10*3/uL (ref 150–400)
RBC: 4.25 MIL/uL (ref 4.22–5.81)
RDW: 14.1 % (ref 11.5–15.5)
WBC Count: 6.1 10*3/uL (ref 4.0–10.5)
nRBC: 0 % (ref 0.0–0.2)

## 2023-04-03 LAB — CMP (CANCER CENTER ONLY)
ALT: 22 U/L (ref 0–44)
AST: 21 U/L (ref 15–41)
Albumin: 4 g/dL (ref 3.5–5.0)
Alkaline Phosphatase: 98 U/L (ref 38–126)
Anion gap: 9 (ref 5–15)
BUN: 15 mg/dL (ref 8–23)
CO2: 26 mmol/L (ref 22–32)
Calcium: 9.1 mg/dL (ref 8.9–10.3)
Chloride: 94 mmol/L — ABNORMAL LOW (ref 98–111)
Creatinine: 0.86 mg/dL (ref 0.61–1.24)
GFR, Estimated: >60 mL/min (ref >60)
Glucose, Bld: 660 mg/dL (ref 70–99)
Potassium: 3.9 mmol/L (ref 3.5–5.1)
Sodium: 129 mmol/L — ABNORMAL LOW (ref 135–145)
Total Bilirubin: 0.8 mg/dL (ref 0.3–1.2)
Total Protein: 7.2 g/dL (ref 6.5–8.1)

## 2023-04-03 LAB — POCT GLYCOSYLATED HEMOGLOBIN (HGB A1C): HbA1c POC (<> result, manual entry): >15 % (ref 4.0–5.6)

## 2023-04-03 LAB — GLUCOSE, POCT (MANUAL RESULT ENTRY)
POC Glucose: 500 mg/dl — AB (ref 70–99)
POC Glucose: >600 mg/dl (ref 70–99)

## 2023-04-03 MED ORDER — METFORMIN HCL 500 MG PO TABS
500.0000 mg | ORAL_TABLET | Freq: Two times a day (BID) | ORAL | 1 refills | Status: DC
Start: 2023-04-03 — End: 2024-02-13
  Filled 2023-04-03 (×2): qty 180, 90d supply, fill #0
  Filled 2023-07-17: qty 180, 90d supply, fill #1

## 2023-04-03 MED ORDER — INSULIN ASPART 100 UNIT/ML IJ SOLN
20.0000 [IU] | Freq: Once | INTRAMUSCULAR | Status: AC
Start: 2023-04-03 — End: 2023-04-03
  Administered 2023-04-03: 20 [IU] via SUBCUTANEOUS

## 2023-04-03 MED ORDER — SODIUM CHLORIDE 0.9 % IV BOLUS
1000.0000 mL | Freq: Once | INTRAVENOUS | Status: DC
Start: 2023-04-03 — End: 2024-02-13
  Administered 2023-04-03: 1000 mL via INTRAVENOUS

## 2023-04-03 MED ORDER — LISINOPRIL 10 MG PO TABS
10.0000 mg | ORAL_TABLET | Freq: Every day | ORAL | 1 refills | Status: DC
Start: 2023-04-03 — End: 2023-10-01
  Filled 2023-04-03 (×2): qty 90, 90d supply, fill #0
  Filled 2023-07-17: qty 90, 90d supply, fill #1

## 2023-04-03 MED ORDER — INSULIN PEN NEEDLE 31G X 5 MM MISC
1.0000 | Freq: Every day | 1 refills | Status: DC
Start: 2023-04-03 — End: 2024-06-25
  Filled 2023-04-03: qty 321.43, fill #0
  Filled 2023-04-03: qty 100, 90d supply, fill #0

## 2023-04-03 MED ORDER — LANTUS SOLOSTAR 100 UNIT/ML ~~LOC~~ SOPN
15.0000 [IU] | PEN_INJECTOR | Freq: Every day | SUBCUTANEOUS | 99 refills | Status: DC
Start: 2023-04-03 — End: 2023-05-02
  Filled 2023-04-03: qty 6, 40d supply, fill #0
  Filled 2023-04-03: qty 15, 100d supply, fill #0

## 2023-04-03 NOTE — Progress Notes (Signed)
CRITICAL VALUE STICKER  CRITICAL VALUE: Glucose 660 mg/dL  RECEIVER (on-site recipient of call): Charlann Noss, RN  DATE & TIME NOTIFIED: [phone]  MESSENGER (representative from lab): Maurine Minister  MD NOTIFIED: Lonna Cobb, NP  TIME OF NOTIFICATION: 1202  RESPONSE: no new orders at this time

## 2023-04-03 NOTE — Telephone Encounter (Signed)
PCP made aware of today's appointment.

## 2023-04-03 NOTE — Progress Notes (Signed)
[facility] OFFICE PROGRESS NOTE   Diagnosis: Gastrointestinal stromal tumor  INTERVAL HISTORY:   Mr. Lincks returns as scheduled.  He is accompanied by [place] interpreter.  Following his last office visit he resumed Gleevec.  No nausea or vomiting.  No diarrhea.  No rash.  He is not taking any other medications.  He reports his son checked his blood sugar yesterday with a reading of 161.  He describes his appetite as "fine".  Living situation continues to be difficult.  He lives at a hotel and has a hard time making food like he would make at home.  He is trying to limit sugar intake by drinking Coke 0 and not adding much sugar to his coffee.  He continues to lose weight.  Objective:  Vital signs in last 24 hours:  Blood pressure (!) 171/88, pulse 78, temperature 98.1 F (36.7 C), temperature source Oral, resp. rate 18, height  (1.575 m), weight 132 lb 11.2 oz (60.2 kg), SpO2 98 %.    HEENT: No thrush or ulcers. Resp: Lungs clear bilaterally. Cardio: Regular rate and rhythm. GI: Abdomen soft and nontender.  No hepatosplenomegaly. Vascular: No leg edema. Skin: Decreased skin turgor.  No rash.   Lab Results:  Lab Results  Component Value Date   WBC 6.1 04/03/2023   HGB 13.0 04/03/2023   HCT 38.0 (L) 04/03/2023   MCV 89.4 04/03/2023   PLT 338 04/03/2023   NEUTROABS 3.1 04/03/2023    Imaging:  No results found.  Medications: I have reviewed the patient's current medications.  Assessment/Plan: GIST of the stomach CT abdomen/pelvis 01/21/2020-large necrotic mass centered in the posterior wall of the stomach, irregular hypodense mass right lobe liver  Upper endoscopy 01/24/2020-large infiltrative mass with no bleeding found in the posterior wall of the stomach.  Biopsy-gastrointestinal stromal tumor  MRI liver 01/28/2020-large exophytic centrally necrotic 17.3 x 12.3 cm gastric mass arising from posterior proximal stomach centered in the left upper  quadrant with thick enhancing wall.  Complex multilocular 10.7 x 8.2 cm cystic liver mass right liver lobe.   Neoadjuvant Gleevec 01/28/2020 CT 03/03/2020, resolution of fluid component of right hepatic abscess, unchanged mass posterior to the gastric body, unchanged left periaortic and aortocaval adenopathy CT 06/01/2020-decreased size of a multilobulated mass arising from the posterior stomach, unchanged retroperitoneal lymph nodes, removal of liver drain resolution of abscess CT 09/14/20-no significant change in the large left upper quadrant mass arising from the proximal stomach.  Near complete resolution of right hepatic lobe abscess. CT 06/06/2021-no change in mass arising from the posterior gastric fundus, no lymphadenopathy or evidence of metastatic disease Patient discontinued Gleevec around September 2022 per his report CT 12/19/2021-significant interval increase in size of the large gastric mass which is partially necrotic, measures 16.5 x 14 cm compared to previous measurement of 9 x 7.6 cm.  Lumen of the stomach is significantly compressed.  Also mass-effect on the pancreas, spleen, left kidney and left adrenal gland.  No definite findings for direct invasion of these organs.  No findings of hepatic metastatic disease.  Stable small scattered mesenteric and retroperitoneal lymph nodes. 12/25/2021 Gleevec 400 mg daily CT abdomen/pelvis 03/15/2022-interval 67% reduction in volume of the large left upper quadrant GI stromal tumor, scattered small retroperitoneal lymphadenopathy similar to prior CT abdomen/pelvis 04/15/2022-large exophytic mass arising off the posterior wall of the stomach noted and compared to prior exam there has been a mild decrease in tumor volume, signs of internal necrosis, new gas identified within  the dominant chronic component. 05/29/2022 resection of the gastric mass, partial gastrectomy, distal pancreatectomy and splenectomy by Dr. Freida Busman. Pathology showed gastrointestinal stromal  tumor with extensive necrosis measuring 4.7 x 13.2 x 8.1 cm; tumor arises within the stomach ulcerating mucosa and abuts the spleen and pancreas; mitotic rate 49; necrosis present comprising approximately 95% of the tumor; treatment effect present; all margins negative for tumor; 0 of 13 lymph nodes involved; risk assessment is high risk; pT4, pN0 Gleevec resumed 400 mg daily following surgery Gleevec discontinued per patient sometime between 09/12/2022 and today due to dizziness Gleevec resumed 200 mg daily beginning 10/25/2022, discontinued approximately 3 weeks prior to office visit 12/27/2022 per patient report 03/17/2023 not taking Gleevec Resumed Gleevec following office visit 03/17/2023 2. Liver lesion in the right lobe             -Liver mass consistent with abscess on MRI of the liver from 01/27/2020             -Status post CT-guided drainage of the liver abscess on 01/28/2020-culture positive for Streptococcus intermedius             -IV antibiotics and discharged home on 10-day course of Augmentin             -CT 03/03/2020-clearance of fluid component of liver abscess, unchanged mass posterior to the gastric body, stable left periaortic and aortocaval adenopathy             -Abscess drain removed 03/09/2020             -Liver abscess resolved on CT 06/01/2020 and 09/14/2020              3. Iron deficiency anemia-oral iron,  resolved Anemia on hospital admission 04/15/2022-likely secondary to sepsis and phlebotomy 4. Gastritis and H. pylori infection 5.  History of fever secondary to the liver abscess 6. Protein calorie malnutrition-resolved 7.  Hospital admission 04/15/2022-sepsis, blood culture positive for Staph epidermidis 8.  Marked hyperglycemia 12/27/2022-metformin 500 mg twice daily prescribed; blood sugar markedly elevated to 01/28/2023.  It does not appear he has started metformin 500 mg twice daily.  Disposition: Mr. Sawyer is completing a course of adjuvant Gleevec following resection of  gastric GIST.  He has taken Gleevec intermittently for the past many months due to financial difficulty.  He resumed Gleevec about a month ago.  His blood sugar has been markedly elevated past several office visits.  Today blood glucose is 660.  We again recommend he go to the emergency department for additional evaluation/treatment.  He has declined this recommendation on multiple occasions and continues to decline today.  We contacted his PCP who gave the same recommendation.  They agree to see him in the office this afternoon.  He will return for lab and follow-up in 4 weeks.  Lonna Cobb ANP/GNP-BC   04/03/2023  11:59 AM

## 2023-04-03 NOTE — Telephone Encounter (Signed)
Kevin [name] presented with a glucose reading of 660 and elevated blood pressure. Despite recommendations to go to the Emergency Department, the patient declined. I contacted his primary care physician and spoke with Angie in triage to discuss the situation and she also recommended that he go to the ED. In order to ensure proper care, he has been scheduled  to see the provider today at 3:00 PM.

## 2023-04-03 NOTE — Telephone Encounter (Signed)
  Chief Complaint: elevated blood glucose over 600 per Ladean Raya at [name] center with Dr. West Pugh office  Symptoms: denies sx  Frequency: today  Pertinent Negatives: Patient denies sx  Disposition: ED /[] Urgent Care (no appt availability in office) / Appointment(In office/virtual)/  [facility]/ Home Care/ Refused Recommended Disposition /[] [facility]/  Follow-up with PCP Additional Notes:   Instructed to go to ED. Ladean Raya , RN recommended ED as well and patient refused to go . Patient is with son now at [phone]. Appt scheduled for today at 3:30 pm with A. Sharon Seller, PA. Please advise.      Reason for Disposition  Blood glucose > 500 mg/dL (25.3 mmol/L)  Answer Assessment - Initial Assessment Questions 1. BLOOD GLUCOSE: "What is your blood glucose level?"      Over 600 per Ladean Raya , RN at [facility] center with Dr. West Pugh office . 2. ONSET: "When did you check the blood glucose?"     Today  3. USUAL RANGE: "What is your glucose level usually?" (e.g., usual fasting morning value, usual evening value)     na 4. KETONES: "Do you check for ketones (urine or blood test strips)?" If Yes, ask: "What does the test show now?"      na 5. TYPE 1 or 2:  "Do you know what type of diabetes you have?"  (e.g., Type 1, Type 2, Gestational; doesn't know)      na 6. INSULIN: "Do you take insulin?" "What type of insulin(s) do you use? What is the mode of delivery? (syringe, pen; injection or pump)?"      na 7. DIABETES PILLS: "Do you take any pills for your diabetes?" If Yes, ask: "Have you missed taking any pills recently?"     Yes metformin. Has not been taking due to denied refills possible due to no up to date OV 8. OTHER SYMPTOMS: "Do you have any symptoms?" (e.g., fever, frequent urination, difficulty breathing, dizziness, weakness, vomiting)     Denies any sx of hyperglycemia  9. PREGNANCY: "Is there any chance you are  pregnant?" "When was your last menstrual period?"     na  Protocols used: Diabetes - High Blood Sugar-A-AH

## 2023-04-03 NOTE — Telephone Encounter (Signed)
Noted  

## 2023-04-03 NOTE — Patient Instructions (Addendum)
Check blood sugars fasting and at bedtime daily.  Drink 80 to 100 ounces water daily.  Take your insulin daily.  If your blood sugars are >400 despite medications or you have dizziness or other symptoms, go to the emergency department.

## 2023-04-03 NOTE — Progress Notes (Signed)
Per PCP. Intravenous fluids were administered, normal saline 1000 ml's.

## 2023-04-04 ENCOUNTER — Telehealth: Payer: Self-pay

## 2023-04-04 NOTE — Progress Notes (Unsigned)
Infusion completed. Right forearm in removed.

## 2023-04-04 NOTE — Progress Notes (Unsigned)
Per PCP. Intravenous fluids were administered, normal saline 1000 ml's.  

## 2023-04-04 NOTE — Telephone Encounter (Signed)
CSW attempted to contact patient, his son and his daughter both yesterday and today.  The vm message states that patient cannot accept calls at this time.  CSW will attempt to call again in the future.

## 2023-04-07 NOTE — Progress Notes (Signed)
Patient ID: GREGG WINCHELL, male   DOB: 1951/03/13, 72 y.o.   MRN: [id number]   Lavoy Bernards, is a 72 y.o. male  WUJ:[id number]  [id number]  DOB - February 15, 1951  Chief Complaint  Patient presents with   Diabetes       Subjective:   Jaaron Oleson is a 72 y.o. male here today after being seen at the oncology office and them finding his blood sugar to be >600 and he was sent here.  He has nit been taking metformin or blood pressure medications bc he "feels fine."  He is also refusing that he even has diabetes despite previous diagnosis and prescriptions.  He has never checked blood sugars.  He has a meter.  His son is with him and only speaks [place] and Mr Odland only speaks spanish.  [employer] interpreters used.  No problems updated.  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 Authorizing Provider  insulin glargine (LANTUS SOLOSTAR) 100 UNIT/ML Solostar Pen Inject 15 Units into the skin daily. 04/03/23  Yes Sherryn Pollino M, PA-C  Insulin Pen Needle 31G X 5 MM MISC Use with insulin daily. 04/03/23  Yes Anders Simmonds, PA-C  acetaminophen (TYLENOL) 500 MG tablet Take 1,000 mg by mouth every 6 (six) hours as needed for mild pain. Patient not taking: Reported on 03/17/2023    [provider]  amLODipine (NORVASC) 10 MG tablet Take 1 tablet (10 mg total) by mouth daily. Patient not taking: Reported on 03/17/2023 11/06/22   Marcine Matar, MD  Blood Glucose Monitoring Suppl [facility] VERIO) w/Device KIT Use to check blood sugar once daily. Please schedule visit with Dr. Laural Benes. Patient not taking: Reported on 03/17/2023 01/15/23   Marcine Matar, MD  docusate sodium (COLACE) 100 MG capsule Take 1 capsule (100 mg total) by mouth 2  (two) times daily. Patient not taking: Reported on 10/24/2022 06/05/22   Fritzi Mandes, MD  glucose blood [facility] VERIO) test strip Use to check blood sugar once daily. Please schedule visit with Dr. Laural Benes. Patient not taking: Reported on 03/17/2023 01/15/23   Marcine Matar, MD  imatinib (GLEEVEC) 100 MG tablet Take 2 tablets (200 mg total) by mouth daily. Take with meals and large glass of water.Caution:Chemotherapy 01/02/23   Ladene Artist, MD  Lancets [facility] DELICA PLUS [place]) MISC Use to check blood sugar once daily. Please schedule visit with Dr. Laural Benes. Patient not taking: Reported on 03/17/2023 01/15/23   Marcine Matar, MD  lisinopril (ZESTRIL) 10 MG tablet Take 1 tablet (10 mg total) by mouth daily. 04/03/23   Anders Simmonds, PA-C  metFORMIN (GLUCOPHAGE) 500 MG tablet Take 1 tablet (500 mg total) by mouth 2 (two) times daily with a meal. 04/03/23   Josha Weekley, Marzella Schlein, PA-C  methocarbamol (ROBAXIN) 500 MG tablet Take 1 tablet (500 mg total) by mouth every 6 (six) hours as needed for muscle spasms. Patient not taking: Reported on 01/10/2023 06/05/22   Fritzi Mandes, MD  ondansetron (ZOFRAN-ODT) 4 MG disintegrating tablet Take 1 tablet (4 mg total) by mouth every 6 (six) hours as needed for nausea. Patient not taking: Reported on 01/10/2023 06/05/22   Fritzi Mandes, MD  potassium chloride SA (KLOR-CON M) 20 MEQ  tablet Take 1 tablet (20 mEq total) by mouth daily. Patient not taking: Reported on 03/17/2023 08/02/22   Rana Snare, NP    ROS: Neg HEENT Neg resp Neg cardiac Neg GI Neg GU Neg MS Neg psych Neg neuro  Objective:   Vitals:   04/03/23 1547  BP: (!) 157/82  Pulse: 69  SpO2: 97%  Weight: 131 lb 12.8 oz (59.8 kg)   Exam General appearance : Awake, alert, not in any distress. Speech Clear. Not toxic looking HEENT: Atraumatic and Normocephalic Neck: Supple, no JVD. No cervical lymphadenopathy.  Chest: Good air entry bilaterally, CTAB.  No  rales/rhonchi/wheezing CVS: S1 S2 regular, no murmurs.  Extremities: B/L Lower Ext shows no edema, both legs are warm to touch Neurology: Awake alert, and oriented X 3, CN II-XII intact, Non focal Skin: No Rash  Data Review Lab Results  Component Value Date   HGBA1C >15 04/03/2023   HGBA1C 8.8 (H) 12/27/2022   HGBA1C 5.4 05/29/2022    Assessment & Plan   1. Type 2 diabetes mellitus with hyperglycemia, unspecified whether long term insulin use (HCC) Blood sugar coming down after 1 L IVF and 20 units insulin.  Patient reports feeling good.  Lengthy discussion about diabetes disease process and importance of medications and follow up.  Son and patient verbalize understanding - Glucose (CBG) - HgB A1c - POCT URINALYSIS DIP (CLINITEK) - insulin aspart (novoLOG) injection 20 Units - insulin glargine (LANTUS SOLOSTAR) 100 UNIT/ML Solostar Pen; Inject 15 Units into the skin daily.  Dispense: 15 mL; Refill: PRN - Insulin Pen Needle 31G X 5 MM MISC; Use with insulin daily.  Dispense: 100 each; Refill: 1 - metFORMIN (GLUCOPHAGE) 500 MG tablet; Take 1 tablet (500 mg total) by mouth 2 (two) times daily with a meal.  Dispense: 180 tablet; Refill: 1 - sodium chloride 0.9 % bolus 1,000 mL - Glucose (CBG)  2. Noncompliance Compliance imperative!  3. Essential hypertension resume - lisinopril (ZESTRIL) 10 MG tablet; Take 1 tablet (10 mg total) by mouth daily.  Dispense: 90 tablet; Refill: 1  4. Hyperglycemia See #1 - Glucose (CBG)  5. 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 The patient verbalized understanding. The patient was told to call to get lab results if they haven't heard anything in the next week.       Georgian Co, PA-C [facility] [place], [place] [phone]   04/07/2023, 8:55 AM

## 2023-04-08 ENCOUNTER — Other Ambulatory Visit: Payer: Self-pay | Admitting: Internal Medicine

## 2023-04-08 ENCOUNTER — Other Ambulatory Visit (HOSPITAL_COMMUNITY): Payer: Self-pay

## 2023-04-08 DIAGNOSIS — E1165 Type 2 diabetes mellitus with hyperglycemia: Secondary | ICD-10-CM

## 2023-04-08 MED ORDER — ONETOUCH VERIO VI STRP
ORAL_STRIP | 0 refills | Status: DC
Start: 2023-04-08 — End: 2024-02-24
  Filled 2023-04-08: qty 100, 100d supply, fill #0

## 2023-04-09 ENCOUNTER — Other Ambulatory Visit (HOSPITAL_COMMUNITY): Payer: Self-pay

## 2023-04-09 ENCOUNTER — Other Ambulatory Visit: Payer: Self-pay

## 2023-04-10 ENCOUNTER — Other Ambulatory Visit (HOSPITAL_COMMUNITY): Payer: Self-pay

## 2023-04-11 ENCOUNTER — Other Ambulatory Visit (HOSPITAL_COMMUNITY): Payer: Self-pay

## 2023-04-17 ENCOUNTER — Other Ambulatory Visit (HOSPITAL_COMMUNITY): Payer: Self-pay

## 2023-04-22 ENCOUNTER — Other Ambulatory Visit (HOSPITAL_COMMUNITY): Payer: Self-pay

## 2023-04-25 ENCOUNTER — Other Ambulatory Visit: Payer: Self-pay

## 2023-05-01 ENCOUNTER — Inpatient Hospital Stay: Payer: Medicare (Managed Care) | Attending: Oncology

## 2023-05-01 ENCOUNTER — Inpatient Hospital Stay: Payer: Medicare (Managed Care) | Admitting: Nurse Practitioner

## 2023-05-02 ENCOUNTER — Ambulatory Visit: Payer: Medicare (Managed Care) | Attending: Internal Medicine | Admitting: Pharmacist

## 2023-05-02 ENCOUNTER — Other Ambulatory Visit: Payer: Self-pay

## 2023-05-02 ENCOUNTER — Encounter: Payer: Self-pay | Admitting: Pharmacist

## 2023-05-02 DIAGNOSIS — E1165 Type 2 diabetes mellitus with hyperglycemia: Secondary | ICD-10-CM | POA: Diagnosis not present

## 2023-05-02 DIAGNOSIS — Z794 Long term (current) use of insulin: Secondary | ICD-10-CM

## 2023-05-02 DIAGNOSIS — Z7984 Long term (current) use of oral hypoglycemic drugs: Secondary | ICD-10-CM | POA: Diagnosis not present

## 2023-05-02 MED ORDER — LANTUS SOLOSTAR 100 UNIT/ML ~~LOC~~ SOPN
20.0000 [IU] | PEN_INJECTOR | Freq: Every day | SUBCUTANEOUS | 99 refills | Status: DC
Start: 2023-05-02 — End: 2023-07-07
  Filled 2023-05-02: qty 15, 75d supply, fill #0

## 2023-05-02 NOTE — Progress Notes (Signed)
   Kevin [name], [place] # [id number] S:     No chief complaint on file.  72 y.o. male who presents for diabetes evaluation, education, and management.  PMH is significant for HTH, GIST of stomach (under care of Dr. Myrle Sheng at [facility]), T2DM, malnutrition, generalized weakness. Patient was referred and last seen by Georgian Co on 04/03/2023. A1c was >15 at that time. At the time, he reported not taking medications as he was "feeling fine".   Today, patient arrives in good spirits and presents with his son.   Patient reports Diabetes was dx ~4 months ago. He tells me that since then, he was not taking medications and was eating sweets and candies. Now he has stopped eating candies. He is feeling better and denies any complaints today.   Family/Social History:  Fhx: no pertinent positives listed Tobacco: former smoker, quit in 1998  Alcohol: none reported   Current diabetes medications include: Lantus 15u daily, metformin 500 mg BID Patient reports adherence to taking all medications, however, he does not inject insulin every day. He does not give reason but reports he instead injects every other day. .   Insurance coverage: Cigna Medicare  Patient denies hypoglycemic events.  Reported home fasting blood sugars: somewhat of a difficult historian. He does not check his sugar consistently. He tells me he is seeing sugars consistently in the 200s-300s. Sometimes >300 - 500.   Patient denies nocturia (nighttime urination).  Patient denies neuropathy (nerve pain). Patient denies visual changes. Patient reports self foot exams.   Patient reported dietary habits:  -Now only drinks black coffee and water  -Has cut out sweets and candies. Admits that he still struggles to limit tortillas.   Patient-reported exercise habits:  -None  -Activity is limited  O:   No CGM in place. No GM with him today.   Lab Results  Component Value Date   HGBA1C >15 04/03/2023   There were no vitals filed for this  visit.  Lipid Panel  No results found for: "CHOL", "TRIG", "HDL", "CHOLHDL", "VLDL", "LDLCALC", "LDLDIRECT"  Clinical Atherosclerotic Cardiovascular Disease (ASCVD): No  The ASCVD Risk score (Arnett DK, et al., 2019) failed to calculate for the following reasons:   Cannot find a previous HDL lab   Cannot find a previous total cholesterol lab   Patient is participating in a Managed Medicaid Plan: no   A/P: Diabetes currently uncontrolled. Patient is able to verbalize appropriate hypoglycemia management plan. Medication adherence appears appropriate.  -Continued Lantus. Will increase to 20u and have him take once daily. Not every other day.  -Continued metformin 500 mg BID.  -Patient educated on purpose, proper use, and potential adverse effects of insulin and metformin.  -Extensively discussed pathophysiology of diabetes, recommended lifestyle interventions, dietary effects on blood sugar control.  -Counseled on s/sx of and management of hypoglycemia.  -Next A1c anticipated 06/2023.   Written patient instructions provided. Patient verbalized understanding of treatment plan.  Total time in face to face counseling 30 minutes.    Follow-up:  Pharmacist in 1 month. PCP clinic visit in July.   Kevin [name], PharmD, [place]y [place], CPP Clinical Pharmacist [facility] [phone]

## 2023-06-02 ENCOUNTER — Ambulatory Visit: Payer: Medicare (Managed Care) | Attending: Internal Medicine | Admitting: Pharmacist

## 2023-06-02 DIAGNOSIS — Z7984 Long term (current) use of oral hypoglycemic drugs: Secondary | ICD-10-CM | POA: Diagnosis not present

## 2023-06-02 DIAGNOSIS — E1165 Type 2 diabetes mellitus with hyperglycemia: Secondary | ICD-10-CM

## 2023-06-02 DIAGNOSIS — Z91199 Patient's noncompliance with other medical treatment and regimen due to unspecified reason: Secondary | ICD-10-CM

## 2023-06-02 DIAGNOSIS — Z794 Long term (current) use of insulin: Secondary | ICD-10-CM

## 2023-06-02 NOTE — Progress Notes (Signed)
    S:     No chief complaint on file.  72 y.o. male who presents for diabetes evaluation, education, and management.  PMH is significant for T2DM, HTN, GIST of stomach.   Patient was referred and last seen by Primary Care Provider, Georgian Co, on 04/03/23. At that visit, A1c was 15.5% (up from 8.8% in January). Of note, patient reported feeling fine and endorsed medication non-adherence. Last seen by pharmacy clinic on 05/02/2023. Patient reported taking Lantus every other day. We increased Lantus to 20 units daily and encouraged daily adherence.  Today, patient arrives in good spirits and presents with his son. Interpretor assist with visit, Martie Lee [id number]. Patient reports diabetes was diagnosed ~ 5 months ago. Son reports that his sister is supposed to be helping their father with his diabetes. Pt is unable to inject insulin himself. Patient is unsure of date that he received his last insulin dose. Patient reports adherence to his oral medications. Per his meter, blood sugar monitoring occurs sporadically throughout the month, in the evenings. Sugars range between 300-500s. Blood sugar last night 447.  Current diabetes medications include: metformin 500 mg BID, Lantus 20 units daily   Patient denies hypoglycemic events.  Reported home fasting blood sugars: No data per meter.  Reported 2 hour post-meal/random blood sugars: >300 - 500s.  Patient denies nocturia (nighttime urination).  Patient denies neuropathy (nerve pain). Patient denies visual changes. Patient reports self foot exams.   Patient reported dietary habits: - He has cut out fruits from his diets because he reports they have sugar. Admits to still eating sweets.  Patient-reported exercise habits:  - Activity is limited, patient is unmotivated to exercise.  Family/Social History:  - Fhx: HTN -Tobacco: former smoker (quit 1998)  Insurance coverage: Cigna Medicare  O:   ROS  Physical Exam  7 day average blood  glucose: Unable to collect.  Lab Results  Component Value Date   HGBA1C >15 04/03/2023   There were no vitals filed for this visit.  Lipid Panel  No results found for: "CHOL", "TRIG", "HDL", "CHOLHDL", "VLDL", "LDLCALC", "LDLDIRECT"  Clinical Atherosclerotic Cardiovascular Disease (ASCVD): No  The ASCVD Risk score (Arnett DK, et al., 2019) failed to calculate for the following reasons:   Cannot find a previous HDL lab   Cannot find a previous total cholesterol lab   Patient is participating in a Managed Medicaid Plan: No  A/P: Diabetes diagnosed currently uncontrolled secondary to non-adherence. Patient is able to verbalize appropriate hypoglycemia management plan. Medication adherence appears suboptimal. Control is suboptimal due to non adherence with Lantus and dietary indiscretion. Informed patient to convince one of his children to help administer the insulin or he has to self inject. Emphasized adherence to taking Lantus daily in order for his blood sugars to come down. - Continue metformin 100 mg BID and Lantus 20 units daily.  -Patient educated on purpose, proper use, and potential adverse effects.  -Extensively discussed pathophysiology of diabetes, recommended lifestyle interventions, dietary effects on blood sugar control.  -Counseled on s/sx of and management of hypoglycemia.  -Next A1c anticipated July 2024.   Written patient instructions provided. Patient verbalized understanding of treatment plan.  Total time in face to face counseling 30 minutes.    Follow-up:  Pharmacist in 2-3 weeks.    Patient seen with  Alesia Banda, PharmD Candidate [employer] 20[employer]   Butch Penny, PharmD, [place], CPP Clinical Pharmacist [facility] [phone]

## 2023-07-03 ENCOUNTER — Encounter: Payer: Self-pay | Admitting: Pharmacist

## 2023-07-03 DIAGNOSIS — Z9189 Other specified personal risk factors, not elsewhere classified: Secondary | ICD-10-CM

## 2023-07-03 NOTE — Progress Notes (Signed)
[facility]) [facility] Quality Pharmacy Team Statin Quality Measure Assessment  07/03/2023  Kevin [name] 1951-05-11 [id number]  Per review of chart and payor information, patient has a diagnosis of diabetes but is not currently filling a statin prescription.  This places patient into the Statin Use In Patients with Diabetes (SUPD) measure for CMS.    I could not find any documentation of previous trial of a statin or a history of statin intolerance. Patient also did not have any lipid values in his lab history.  He has an upcoming follow up visit on 07/04/23.  If deemed therapeutically appropriate a statin assessment could be associated with the upcoming visit.  If patient has experienced statin intolerance, an alternative statin could be started with an alternative dosing schedule or a statin exclusion code could be associated with the visit. Associating a code will remove the patient from the measure.   Please consider ONE of the following recommendations:  Initiate high intensity statin Atorvastatin 40 mg once daily, #90, 3 refills   Rosuvastatin 20 mg once daily, #90, 3 refills    Initiate moderate intensity          statin with reduced frequency if prior          statin intolerance 1x weekly, #13, 3 refills   2x weekly, #26, 3 refills   3x weekly, #39, 3 refills    Code for past statin intolerance or  other exclusions (required annually)  Provider Requirements: Associate code during an office visit or telehealth encounter  Drug Induced Myopathy G72.0   Myopathy, unspecified G72.9   Myositis, unspecified M60.9   Rhabdomyolysis M62.82   Cirrhosis of liver K74.69   Prediabetes R73.03   PCOS E28.2    Plan:  Route note to Patient's Provider prior to the upcomig appt.  Beecher Mcardle, PharmD, BCACP [facility] Clinical Pharmacist [phone]

## 2023-07-04 ENCOUNTER — Ambulatory Visit: Payer: Medicare (Managed Care) | Admitting: Internal Medicine

## 2023-07-07 ENCOUNTER — Ambulatory Visit: Payer: Medicare (Managed Care) | Attending: Family Medicine | Admitting: Pharmacist

## 2023-07-07 ENCOUNTER — Encounter: Payer: Self-pay | Admitting: Pharmacist

## 2023-07-07 ENCOUNTER — Other Ambulatory Visit: Payer: Self-pay

## 2023-07-07 DIAGNOSIS — E1165 Type 2 diabetes mellitus with hyperglycemia: Secondary | ICD-10-CM

## 2023-07-07 DIAGNOSIS — Z7984 Long term (current) use of oral hypoglycemic drugs: Secondary | ICD-10-CM | POA: Diagnosis not present

## 2023-07-07 DIAGNOSIS — Z794 Long term (current) use of insulin: Secondary | ICD-10-CM | POA: Diagnosis not present

## 2023-07-07 MED ORDER — FREESTYLE LIBRE 3 SENSOR MISC
3 refills | Status: DC
  Filled 2023-07-07: qty 2, 28d supply, fill #0
  Filled 2023-07-29 (×2): qty 2, 28d supply, fill #1

## 2023-07-07 MED ORDER — LANTUS SOLOSTAR 100 UNIT/ML ~~LOC~~ SOPN
20.0000 [IU] | PEN_INJECTOR | Freq: Every day | SUBCUTANEOUS | 99 refills | Status: DC
  Filled 2023-07-07: qty 15, 75d supply, fill #0

## 2023-07-07 NOTE — Progress Notes (Addendum)
    S:     No chief complaint on file.  72 y.o. male who presents for diabetes evaluation, education, and management.  PMH is significant for T2DM, HTN, GIST of stomach.   Patient was referred and last seen by Primary Care Provider, Georgian Co, on 04/03/23. At that visit, A1c was 15.5% (up from 8.8% in January). Last seen by pharmacy clinic on 06/02/2023.   Today, patient arrives in good spirits and presents with his son. Interpretor assist with visit, Martie Lee [id number], Annabelle. Patient reports diabetes was diagnosed ~ 5 months ago. Pt's sister reports that she injects his insulin and misses ~3 doses per week due to her schedule. She is unable to check his CBG at home and is interested in CGM. Pt is unable to inject insulin himself. Patient reports adherence to his oral medications.   Current diabetes medications include: metformin 500 mg BID, Lantus 20 units daily (takes 15u ~3-4x/weekly)   Patient denies hypoglycemic events.  Reported home fasting blood sugars: not checking  Patient denies nocturia (nighttime urination).  Patient denies neuropathy (nerve pain). Patient denies visual changes. Patient reports self foot exams.   Patient reported dietary habits: - He has cut out fruits from his diets because he reports they have sugar. Admits to still eating sweets.  Patient-reported exercise habits:  - Activity is limited, patient is unmotivated to exercise.  Family/Social History:  - Fhx: HTN -Tobacco: former smoker (quit 1998)  Insurance coverage: [employer] Medicare  O:   Lab Results  Component Value Date   HGBA1C >15 04/03/2023   There were no vitals filed for this visit.  Lipid Panel  No results found for: "CHOL", "TRIG", "HDL", "CHOLHDL", "VLDL", "LDLCALC", "LDLDIRECT"  Clinical Atherosclerotic Cardiovascular Disease (ASCVD): No  The ASCVD Risk score (Arnett DK, et al., 2019) failed to calculate for the following reasons:   Cannot find a previous HDL lab   Cannot  find a previous total cholesterol lab   Patient is participating in a Managed Medicaid Plan: No  A/P: Diabetes diagnosed currently uncontrolled secondary to non-adherence. Patient is able to verbalize appropriate hypoglycemia management plan. Medication adherence appears suboptimal. Control is suboptimal due to non-adherence with Lantus and dietary indiscretion.  - Continue metformin 500 mg BID - Increase Lantus to 20 units daily. Switch to AM dosing as this is more compatible with daughter's schedule.  - CGM: downloaded Libre 3 app on phone and sent sensors downstairs. These are covered at a $0 copay. Placed his first sensor in clinic.  -Patient educated on purpose, proper use, and potential adverse effects.  -Extensively discussed pathophysiology of diabetes, recommended lifestyle interventions, dietary effects on blood sugar control.  -Counseled on s/sx of and management of hypoglycemia.  -Next A1c is due. Will wait until follow-up in 2-3 weeks.   Written patient instructions provided. Patient verbalized understanding of treatment plan.  Total time in face to face counseling 30 minutes.    Follow-up:  Pharmacist in 2-3 weeks.   Butch Penny, PharmD, [place], CPP Clinical Pharmacist [facility] [phone]

## 2023-07-17 ENCOUNTER — Other Ambulatory Visit: Payer: Self-pay

## 2023-07-17 ENCOUNTER — Other Ambulatory Visit (HOSPITAL_COMMUNITY): Payer: Self-pay

## 2023-07-29 ENCOUNTER — Other Ambulatory Visit: Payer: Self-pay

## 2023-08-04 ENCOUNTER — Ambulatory Visit: Payer: Medicare (Managed Care) | Admitting: Pharmacist

## 2023-08-05 ENCOUNTER — Ambulatory Visit: Payer: Medicare (Managed Care) | Admitting: Pharmacist

## 2023-09-05 NOTE — Progress Notes (Unsigned)
S:    72 y.o. male who presents for diabetes evaluation, education, and management. PMH is significant for T2DM, HTN, GIST of stomach, and history of non-compliance.   Patient was referred and last seen by Primary Care Provider, Georgian Co, on 04/03/23. At that visit, A1c was 15.5% (up from 8.8% in January). Last seen by pharmacy clinic on 07/07/2023. At visit, metformin 500 mg BID was continued and Lantus increased from 15 units to 20 units.  Patient arrives in *** good spirits and presents without *** any assistance. ***Patient is accompanied by ***.   Family/Social History:  - Fhx: HTN -Tobacco: former smoker (quit 1998)  Current diabetes medications include: metformin 500 mg BID, Lantus 20 units daily  Current hypertension medications include: amlodipine 10 mg, lisinopril 10 mg  Current hyperlipidemia medications include: none  Patient reports adherence to taking all medications as prescribed.  *** Patient denies adherence with medications, reports missing *** medications *** times per week, on average.  Do you feel that your medications are working for you? {YES NO:22349} Have you been experiencing any side effects to the medications prescribed? {YES NO:22349} Do you have any problems obtaining medications due to transportation or finances? {YES ZO:10960} Insurance coverage: [employer] Medicare   Patient {Actions; denies-reports:120008} hypoglycemic events.  Reported home fasting blood sugars: ***  Reported 2 hour post-meal/random blood sugars: ***.  Patient {Actions; denies-reports:120008} nocturia (nighttime urination).  Patient {Actions; denies-reports:120008} neuropathy (nerve pain). Patient {Actions; denies-reports:120008} visual changes. Patient {Actions; denies-reports:120008} self foot exams.   Patient reported dietary habits: Eats *** meals/day Breakfast: *** Lunch: *** Dinner: *** Snacks: *** Drinks: ***  Patient-reported exercise habits: ***  O:  7 day  average blood glucose: ***  Libre3 *** CGM Download today *** on *** % Time CGM is active: ***% Average Glucose: *** mg/dL Glucose Management Indicator: ***  Glucose Variability: ***% (goal <36%) Time in Goal:  - Time in range 70-180: ***% - Time above range: ***% - Time below range: ***% Observed patterns:   Lab Results  Component Value Date   HGBA1C >15 04/03/2023   There were no vitals filed for this visit.  Lipid Panel  No results found for: "CHOL", "TRIG", "HDL", "CHOLHDL", "VLDL", "LDLCALC", "LDLDIRECT"  Clinical Atherosclerotic Cardiovascular Disease (ASCVD): No  The ASCVD Risk score (Arnett DK, et al., 2019) failed to calculate for the following reasons:   Cannot find a previous HDL lab   Cannot find a previous total cholesterol lab   Patient is participating in a Managed Medicaid Plan: No   A/P: Diabetes longstanding *** currently ***. Patient is *** able to verbalize appropriate hypoglycemia management plan. Medication adherence appears ***. Control is suboptimal due to ***. -{Meds adjust:18428} basal insulin *** Lantus/Basaglar/Semglee (insulin glargine) *** Tresiba (insulin degludec) from *** units to *** units daily in the morning. Patient will continue to titrate 1 unit every *** days if fasting blood sugar > 100mg /dl until fasting blood sugars reach goal or next visit.  -{Meds adjust:18428} rapid insulin *** Novolog (insulin aspart) *** Humalog (insulin lispro) from *** to ***.  -{Meds adjust:18428} GLP-1 *** Trulicity (dulaglutide) *** Ozempic (semaglutide) *** Mounjaro (tirzepatide) from *** mg to *** mg .  -{Meds adjust:18428} SGLT2-I *** Farxiga (dapagliflozin) *** Jardiance (empagliflozin) 10 mg. Counseled on sick day rules. -{Meds adjust:18428} metformin ***.  -Patient educated on purpose, proper use, and potential adverse effects of ***.  -Extensively discussed pathophysiology of diabetes, recommended lifestyle interventions, dietary effects on blood sugar  control.  -Counseled on  s/sx of and management of hypoglycemia.  -Next A1c anticipated 11/2023.   ASCVD risk - primary prevention in patient with diabetes. No lipid panel on file. ASCVD risk factors include DM, HTN. Moderate intensity statin indicated.  -Lipid panel obtained today -Plan to start statin following lipid panel results   Hypertension longstanding *** currently ***. Blood pressure goal of <130/80 *** mmHg. Medication adherence ***. Blood pressure control is suboptimal due to ***. -{Meds adjust:18428} *** mg.  Written patient instructions provided. Patient verbalized understanding of treatment plan.  Total time in face to face counseling *** minutes.    Follow-up:  Pharmacist *** PCP clinic visit: no appointment currently schedued   Roslyn Smiling, PharmD PGY1 Pharmacy Resident 09/08/2023 8:34 AM

## 2023-09-08 ENCOUNTER — Ambulatory Visit: Payer: Medicare (Managed Care) | Admitting: Pharmacist

## 2023-10-01 ENCOUNTER — Other Ambulatory Visit (HOSPITAL_BASED_OUTPATIENT_CLINIC_OR_DEPARTMENT_OTHER): Payer: Self-pay | Admitting: Pharmacist

## 2023-10-01 ENCOUNTER — Other Ambulatory Visit: Payer: Self-pay

## 2023-10-01 ENCOUNTER — Other Ambulatory Visit (HOSPITAL_COMMUNITY): Payer: Self-pay

## 2023-10-01 DIAGNOSIS — Z7984 Long term (current) use of oral hypoglycemic drugs: Secondary | ICD-10-CM

## 2023-10-01 DIAGNOSIS — E669 Obesity, unspecified: Secondary | ICD-10-CM | POA: Diagnosis not present

## 2023-10-01 DIAGNOSIS — I10 Essential (primary) hypertension: Secondary | ICD-10-CM

## 2023-10-01 MED ORDER — LISINOPRIL 10 MG PO TABS
10.0000 mg | ORAL_TABLET | Freq: Every day | ORAL | 0 refills | Status: DC
  Filled 2023-10-01: qty 90, 90d supply, fill #0

## 2023-10-01 NOTE — Progress Notes (Signed)
Pharmacy Quality Measure Review  This patient is appearing on a report for being at risk of failing the adherence measure for diabetes and hypertension (ACEi/ARB) medications this calendar year.   Medication: lisinopril  Last fill date: 07/17/2023 for 90 day supply  Insurance report was not up to date. No action needed at this time.   Medication: metformin Last fill date: 07/17/23 for 90 day supply  Insurance report was not up to date. No action needed at this time. ,  Butch Penny, PharmD, [place], CPP Clinical Pharmacist [facility] & [facility] [phone]

## 2023-10-02 ENCOUNTER — Other Ambulatory Visit (HOSPITAL_COMMUNITY): Payer: Self-pay

## 2023-10-06 ENCOUNTER — Other Ambulatory Visit: Payer: Self-pay

## 2023-10-30 ENCOUNTER — Other Ambulatory Visit (HOSPITAL_COMMUNITY): Payer: Self-pay

## 2024-02-06 ENCOUNTER — Inpatient Hospital Stay (HOSPITAL_COMMUNITY)
Admission: EM | Admit: 2024-02-06 | Discharge: 2024-02-13 | DRG: 638 | Disposition: A | Discharge status: Home / Self Care | Payer: Medicare (Managed Care) | Attending: Internal Medicine | Admitting: Internal Medicine

## 2024-02-06 ENCOUNTER — Ambulatory Visit: Payer: Self-pay | Admitting: Internal Medicine

## 2024-02-06 ENCOUNTER — Emergency Department (HOSPITAL_COMMUNITY): Payer: Medicare (Managed Care)

## 2024-02-06 ENCOUNTER — Telehealth: Payer: Self-pay | Admitting: *Deleted

## 2024-02-06 ENCOUNTER — Other Ambulatory Visit: Payer: Self-pay

## 2024-02-06 ENCOUNTER — Encounter (HOSPITAL_COMMUNITY): Payer: Self-pay | Admitting: *Deleted

## 2024-02-06 DIAGNOSIS — R339 Retention of urine, unspecified: Secondary | ICD-10-CM

## 2024-02-06 DIAGNOSIS — M4802 Spinal stenosis, cervical region: Secondary | ICD-10-CM | POA: Diagnosis present

## 2024-02-06 DIAGNOSIS — N320 Bladder-neck obstruction: Secondary | ICD-10-CM | POA: Diagnosis present

## 2024-02-06 DIAGNOSIS — D649 Anemia, unspecified: Secondary | ICD-10-CM | POA: Diagnosis present

## 2024-02-06 DIAGNOSIS — M5412 Radiculopathy, cervical region: Secondary | ICD-10-CM | POA: Diagnosis present

## 2024-02-06 DIAGNOSIS — E876 Hypokalemia: Secondary | ICD-10-CM | POA: Diagnosis not present

## 2024-02-06 DIAGNOSIS — Z85831 Personal history of malignant neoplasm of soft tissue: Secondary | ICD-10-CM

## 2024-02-06 DIAGNOSIS — Z87891 Personal history of nicotine dependence: Secondary | ICD-10-CM

## 2024-02-06 DIAGNOSIS — E042 Nontoxic multinodular goiter: Secondary | ICD-10-CM | POA: Diagnosis present

## 2024-02-06 DIAGNOSIS — T383X6A Underdosing of insulin and oral hypoglycemic [antidiabetic] drugs, initial encounter: Secondary | ICD-10-CM | POA: Diagnosis present

## 2024-02-06 DIAGNOSIS — I10 Essential (primary) hypertension: Secondary | ICD-10-CM | POA: Diagnosis not present

## 2024-02-06 DIAGNOSIS — Z794 Long term (current) use of insulin: Secondary | ICD-10-CM | POA: Diagnosis not present

## 2024-02-06 DIAGNOSIS — N401 Enlarged prostate with lower urinary tract symptoms: Secondary | ICD-10-CM | POA: Diagnosis present

## 2024-02-06 DIAGNOSIS — R338 Other retention of urine: Secondary | ICD-10-CM | POA: Diagnosis present

## 2024-02-06 DIAGNOSIS — D631 Anemia in chronic kidney disease: Secondary | ICD-10-CM | POA: Diagnosis present

## 2024-02-06 DIAGNOSIS — E1100 Type 2 diabetes mellitus with hyperosmolarity without nonketotic hyperglycemic-hyperosmolar coma (NKHHC): Secondary | ICD-10-CM | POA: Diagnosis present

## 2024-02-06 DIAGNOSIS — N189 Chronic kidney disease, unspecified: Secondary | ICD-10-CM

## 2024-02-06 DIAGNOSIS — I1310 Hypertensive heart and chronic kidney disease without heart failure, with stage 1 through stage 4 chronic kidney disease, or unspecified chronic kidney disease: Secondary | ICD-10-CM | POA: Diagnosis present

## 2024-02-06 DIAGNOSIS — E041 Nontoxic single thyroid nodule: Secondary | ICD-10-CM

## 2024-02-06 DIAGNOSIS — M25511 Pain in right shoulder: Secondary | ICD-10-CM | POA: Diagnosis present

## 2024-02-06 DIAGNOSIS — E1122 Type 2 diabetes mellitus with diabetic chronic kidney disease: Secondary | ICD-10-CM | POA: Diagnosis present

## 2024-02-06 DIAGNOSIS — E11649 Type 2 diabetes mellitus with hypoglycemia without coma: Secondary | ICD-10-CM | POA: Diagnosis not present

## 2024-02-06 DIAGNOSIS — N179 Acute kidney failure, unspecified: Secondary | ICD-10-CM | POA: Diagnosis present

## 2024-02-06 DIAGNOSIS — Z9081 Acquired absence of spleen: Secondary | ICD-10-CM

## 2024-02-06 DIAGNOSIS — R17 Unspecified jaundice: Secondary | ICD-10-CM | POA: Diagnosis present

## 2024-02-06 DIAGNOSIS — R54 Age-related physical debility: Secondary | ICD-10-CM | POA: Diagnosis present

## 2024-02-06 DIAGNOSIS — E871 Hypo-osmolality and hyponatremia: Secondary | ICD-10-CM | POA: Diagnosis present

## 2024-02-06 DIAGNOSIS — Z9181 History of falling: Secondary | ICD-10-CM | POA: Diagnosis not present

## 2024-02-06 DIAGNOSIS — R7401 Elevation of levels of liver transaminase levels: Secondary | ICD-10-CM | POA: Diagnosis present

## 2024-02-06 DIAGNOSIS — Z8249 Family history of ischemic heart disease and other diseases of the circulatory system: Secondary | ICD-10-CM

## 2024-02-06 DIAGNOSIS — I161 Hypertensive emergency: Secondary | ICD-10-CM

## 2024-02-06 DIAGNOSIS — E860 Dehydration: Secondary | ICD-10-CM | POA: Diagnosis present

## 2024-02-06 DIAGNOSIS — K219 Gastro-esophageal reflux disease without esophagitis: Secondary | ICD-10-CM | POA: Insufficient documentation

## 2024-02-06 DIAGNOSIS — N138 Other obstructive and reflux uropathy: Secondary | ICD-10-CM | POA: Diagnosis present

## 2024-02-06 DIAGNOSIS — N1330 Unspecified hydronephrosis: Secondary | ICD-10-CM

## 2024-02-06 DIAGNOSIS — T451X6A Underdosing of antineoplastic and immunosuppressive drugs, initial encounter: Secondary | ICD-10-CM | POA: Diagnosis present

## 2024-02-06 DIAGNOSIS — Z8509 Personal history of malignant neoplasm of other digestive organs: Secondary | ICD-10-CM

## 2024-02-06 DIAGNOSIS — Z7984 Long term (current) use of oral hypoglycemic drugs: Secondary | ICD-10-CM

## 2024-02-06 DIAGNOSIS — N400 Enlarged prostate without lower urinary tract symptoms: Secondary | ICD-10-CM

## 2024-02-06 DIAGNOSIS — N3289 Other specified disorders of bladder: Secondary | ICD-10-CM | POA: Diagnosis present

## 2024-02-06 DIAGNOSIS — R160 Hepatomegaly, not elsewhere classified: Secondary | ICD-10-CM | POA: Diagnosis present

## 2024-02-06 DIAGNOSIS — Z91119 Patient's noncompliance with dietary regimen due to unspecified reason: Secondary | ICD-10-CM

## 2024-02-06 DIAGNOSIS — E870 Hyperosmolality and hypernatremia: Secondary | ICD-10-CM

## 2024-02-06 DIAGNOSIS — Z8507 Personal history of malignant neoplasm of pancreas: Secondary | ICD-10-CM

## 2024-02-06 DIAGNOSIS — E119 Type 2 diabetes mellitus without complications: Secondary | ICD-10-CM

## 2024-02-06 DIAGNOSIS — Z79899 Other long term (current) drug therapy: Secondary | ICD-10-CM

## 2024-02-06 DIAGNOSIS — Z903 Acquired absence of stomach [part of]: Secondary | ICD-10-CM

## 2024-02-06 DIAGNOSIS — T464X6A Underdosing of angiotensin-converting-enzyme inhibitors, initial encounter: Secondary | ICD-10-CM | POA: Diagnosis present

## 2024-02-06 DIAGNOSIS — E1165 Type 2 diabetes mellitus with hyperglycemia: Secondary | ICD-10-CM

## 2024-02-06 DIAGNOSIS — Z91138 Patient's unintentional underdosing of medication regimen for other reason: Secondary | ICD-10-CM

## 2024-02-06 DIAGNOSIS — N182 Chronic kidney disease, stage 2 (mild): Secondary | ICD-10-CM | POA: Diagnosis present

## 2024-02-06 DIAGNOSIS — R519 Headache, unspecified: Secondary | ICD-10-CM | POA: Diagnosis present

## 2024-02-06 DIAGNOSIS — M62838 Other muscle spasm: Secondary | ICD-10-CM | POA: Diagnosis present

## 2024-02-06 LAB — COMPREHENSIVE METABOLIC PANEL
ALT: 20 U/L (ref 0–44)
AST: 23 U/L (ref 15–41)
Albumin: 3.3 g/dL — ABNORMAL LOW (ref 3.5–5.0)
Alkaline Phosphatase: 153 U/L — ABNORMAL HIGH (ref 38–126)
Anion gap: 19 — ABNORMAL HIGH (ref 5–15)
BUN: 34 mg/dL — ABNORMAL HIGH (ref 8–23)
CO2: 21 mmol/L — ABNORMAL LOW (ref 22–32)
Calcium: 9.1 mg/dL (ref 8.9–10.3)
Chloride: 82 mmol/L — ABNORMAL LOW (ref 98–111)
Creatinine, Ser: 2.08 mg/dL — ABNORMAL HIGH (ref 0.61–1.24)
GFR, Estimated: 33 mL/min — ABNORMAL LOW (ref >60)
Glucose, Bld: 878 mg/dL (ref 70–99)
Potassium: 4.3 mmol/L (ref 3.5–5.1)
Sodium: 122 mmol/L — ABNORMAL LOW (ref 135–145)
Total Bilirubin: 1.3 mg/dL — ABNORMAL HIGH (ref 0.0–1.2)
Total Protein: 7.8 g/dL (ref 6.5–8.1)

## 2024-02-06 LAB — URINALYSIS, ROUTINE W REFLEX MICROSCOPIC
Bacteria, UA: NONE SEEN
Bilirubin Urine: NEGATIVE
Glucose, UA: >=500 mg/dL — AB
Ketones, ur: 20 mg/dL — AB
Leukocytes,Ua: NEGATIVE
Nitrite: NEGATIVE
Protein, ur: NEGATIVE mg/dL
Specific Gravity, Urine: 1.018 (ref 1.005–1.030)
pH: 5 (ref 5.0–8.0)

## 2024-02-06 LAB — CBC
HCT: 37.8 % — ABNORMAL LOW (ref 39.0–52.0)
Hemoglobin: 12.9 g/dL — ABNORMAL LOW (ref 13.0–17.0)
MCH: 31.9 pg (ref 26.0–34.0)
MCHC: 34.1 g/dL (ref 30.0–36.0)
MCV: 93.6 fL (ref 80.0–100.0)
Platelets: 256 10*3/uL (ref 150–400)
RBC: 4.04 MIL/uL — ABNORMAL LOW (ref 4.22–5.81)
RDW: 13.4 % (ref 11.5–15.5)
WBC: 5.4 10*3/uL (ref 4.0–10.5)
nRBC: 0 % (ref 0.0–0.2)

## 2024-02-06 LAB — CBG MONITORING, ED
Glucose-Capillary: >600 mg/dL (ref 70–99)
Glucose-Capillary: >600 mg/dL (ref 70–99)

## 2024-02-06 LAB — BETA-HYDROXYBUTYRIC ACID: Beta-Hydroxybutyric Acid: 3.99 mmol/L — ABNORMAL HIGH (ref 0.05–0.27)

## 2024-02-06 LAB — GLUCOSE, CAPILLARY: Glucose-Capillary: >600 mg/dL (ref 70–99)

## 2024-02-06 LAB — LIPASE, BLOOD: Lipase: 24 U/L (ref 11–51)

## 2024-02-06 LAB — OSMOLALITY: Osmolality: 326 mosm/kg (ref 275–295)

## 2024-02-06 MED ORDER — TAMSULOSIN HCL 0.4 MG PO CAPS
0.8000 mg | ORAL_CAPSULE | Freq: Every day | ORAL | Status: DC
  Administered 2024-02-06 – 2024-02-12 (×7): 0.8 mg via ORAL
  Filled 2024-02-06 (×7): qty 2

## 2024-02-06 MED ORDER — SODIUM CHLORIDE 0.9% FLUSH
3.0000 mL | Freq: Two times a day (BID) | INTRAVENOUS | Status: DC
  Administered 2024-02-06 – 2024-02-13 (×12): 3 mL via INTRAVENOUS

## 2024-02-06 MED ORDER — DEXTROSE IN LACTATED RINGERS 5 % IV SOLN: INTRAVENOUS | Status: DC

## 2024-02-06 MED ORDER — HEPARIN SODIUM (PORCINE) 5000 UNIT/ML IJ SOLN
5000.0000 [IU] | Freq: Three times a day (TID) | INTRAMUSCULAR | Status: DC
  Administered 2024-02-06 – 2024-02-13 (×20): 5000 [IU] via SUBCUTANEOUS
  Filled 2024-02-06 (×20): qty 1

## 2024-02-06 MED ORDER — ONDANSETRON HCL 4 MG/2ML IJ SOLN: 4.0000 mg | Freq: Four times a day (QID) | INTRAMUSCULAR | Status: DC | PRN

## 2024-02-06 MED ORDER — AMLODIPINE BESYLATE 5 MG PO TABS
5.0000 mg | ORAL_TABLET | Freq: Every day | ORAL | Status: DC
  Administered 2024-02-06 – 2024-02-13 (×8): 5 mg via ORAL
  Filled 2024-02-06 (×8): qty 1

## 2024-02-06 MED ORDER — LABETALOL HCL 5 MG/ML IV SOLN: 10.0000 mg | INTRAVENOUS | Status: DC | PRN

## 2024-02-06 MED ORDER — SODIUM CHLORIDE 0.9% FLUSH: 3.0000 mL | INTRAVENOUS | Status: DC | PRN

## 2024-02-06 MED ORDER — LACTATED RINGERS IV SOLN: INTRAVENOUS | Status: DC

## 2024-02-06 MED ORDER — HYDRALAZINE HCL 20 MG/ML IJ SOLN: 10.0000 mg | Freq: Four times a day (QID) | INTRAMUSCULAR | Status: DC | PRN

## 2024-02-06 MED ORDER — DEXTROSE 50 % IV SOLN: 0.0000 mL | INTRAVENOUS | Status: DC | PRN

## 2024-02-06 MED ORDER — DIAZEPAM 2 MG PO TABS
2.0000 mg | ORAL_TABLET | Freq: Once | ORAL | Status: AC
  Administered 2024-02-06: 2 mg via ORAL
  Filled 2024-02-06: qty 1

## 2024-02-06 MED ORDER — LIDOCAINE 5 % EX PTCH
1.0000 | MEDICATED_PATCH | CUTANEOUS | Status: AC
  Administered 2024-02-06 – 2024-02-08 (×3): 1 via TRANSDERMAL
  Filled 2024-02-06 (×3): qty 1

## 2024-02-06 MED ORDER — ACETAMINOPHEN 650 MG RE SUPP: 650.0000 mg | Freq: Four times a day (QID) | RECTAL | Status: DC | PRN

## 2024-02-06 MED ORDER — ACETAMINOPHEN 325 MG PO TABS: 650.0000 mg | ORAL_TABLET | Freq: Four times a day (QID) | ORAL | Status: DC | PRN

## 2024-02-06 MED ORDER — HYDRALAZINE HCL 20 MG/ML IJ SOLN: 20.0000 mg | Freq: Four times a day (QID) | INTRAMUSCULAR | Status: DC | PRN

## 2024-02-06 MED ORDER — ONDANSETRON HCL 4 MG PO TABS
4.0000 mg | ORAL_TABLET | Freq: Four times a day (QID) | ORAL | Status: DC | PRN
Start: 2024-02-06 — End: 2024-02-13

## 2024-02-06 MED ORDER — LORAZEPAM 2 MG/ML IJ SOLN: 1.0000 mg | Freq: Once | INTRAMUSCULAR | Status: DC

## 2024-02-06 MED ORDER — INSULIN REGULAR(HUMAN) IN NACL 100-0.9 UT/100ML-% IV SOLN
INTRAVENOUS | Status: DC
Start: 2024-02-06 — End: 2024-02-06
  Administered 2024-02-06: 9 [IU]/h via INTRAVENOUS
  Filled 2024-02-06: qty 100

## 2024-02-06 MED ORDER — SODIUM CHLORIDE 0.9 % IV SOLN: 250.0000 mL | INTRAVENOUS | Status: AC | PRN

## 2024-02-06 MED ORDER — LABETALOL HCL 5 MG/ML IV SOLN
10.0000 mg | Freq: Once | INTRAVENOUS | Status: AC
  Administered 2024-02-06: 10 mg via INTRAVENOUS
  Filled 2024-02-06: qty 4

## 2024-02-06 MED ORDER — INSULIN REGULAR(HUMAN) IN NACL 100-0.9 UT/100ML-% IV SOLN
INTRAVENOUS | Status: DC
Start: 2024-02-06 — End: 2024-02-07

## 2024-02-06 MED ORDER — SODIUM CHLORIDE 0.9% FLUSH
3.0000 mL | Freq: Two times a day (BID) | INTRAVENOUS | Status: DC
  Administered 2024-02-06 – 2024-02-13 (×10): 3 mL via INTRAVENOUS

## 2024-02-06 NOTE — Telephone Encounter (Addendum)
 Copied from CRM [id number]. Topic: Clinical - Red Word Triage >> Feb 06, 2024  3:37 PM Antony Haste wrote: Red Word that prompted transfer to Nurse Triage: falling around, fatigued, right hand unable to use - numbness due to injury hit on the table  Patient's wife called. She stated that patient had a fall 3 days ago. His hand has been numb for 2 days. He has been having urinary incontinence for at least 1 week.     Chief Complaint: Recent Fall Symptoms: hand tremors  Frequency: Tremors ongoing for a couple days Disposition: [x] ED /[] Urgent Care (no appt availability in office) / [] Appointment(In office/virtual)/ []  [facility]/ [] Home Care/ [] Refused Recommended Disposition /[] [place] [facility]/ []  Follow-up with PCP    Additional Notes: Patient's wife called the office. She stated that he fell recently, but she did not know all of his symptoms because there is a language barrier between her and patient. This RN ended the call and then called again with a Spanish interpreter.  Patient stated he fell 3 days ago. He stated he has been feeling dizzy and he just passed out at the time. He denies dizziness right now. Two days ago, he started having tremors in his right hand. His hand is shaking so bad that he can't control it. Patient can't hold any items. He is having difficulty holding a phone and a cup. When he fell, he landed on his stomach and right shoulder. Advised patient to be seen in the ED today. His wife verbalized understanding and stated that her daughter will bring patient to ED.   Information collected with assistance Spanish interpreters, [place] [phone] and [phone]   Reason for Disposition  [1] New-onset muscle jerks (twitches, spasms) AND [2] present now    Hand Tremors  Answer Assessment - Initial Assessment Questions 1. MECHANISM: "How did the fall happen?"     Passed out from dizziness    2. ONSET: "When did the fall happen?" (e.g., minutes, hours, or days  ago)     3 days ago  3. LOCATION: "What part of the body hit the ground?" (e.g., back, buttocks, head, hips, knees, hands, head, stomach)     Fell on stomach and on R shoulder  4. INJURY: "Did you hurt (injure) yourself when you fell?" If Yes, ask: "What did you injure? Tell me more about this?" (e.g., body area; type of injury; pain severity)"     Shakiness in R hand  5. PAIN: "Is there any pain?" If Yes, ask: "How bad is the pain?" (e.g., Scale 1-10; or mild,  moderate, severe)   - NONE (0): No pain   - MILD (1-3): Doesn't interfere with normal activities    - MODERATE (4-7): Interferes with normal activities or awakens from sleep    - SEVERE (8-10): Excruciating pain, unable to do any normal activities      Patient said that he does not have pain. Per wife, pt is hitting hand on chair, (appears to be in pain)   6. OTHER SYMPTOMS: "Do you have any other symptoms?" (e.g., dizziness, fever, weakness; new onset or worsening).      No  Answer Assessment - Initial Assessment Questions 1. APPEARANCE of MOVEMENT: "What did the jerking or twitching look like?" (e.g., body area)     Severe hand tremors  2. ONSET: "When did this start happening?" (e.g., hours, days, weeks, months ago)     2 days ago  3. OTHER SYMPTOMS: "Are there any other  symptoms?" (e.g., fever, headache)     No  Protocols used: Falls and Falling-A-AH, Muscle Jerks - Tics - [facility]

## 2024-02-06 NOTE — ED Provider Notes (Signed)
 [place] [facility] AT [facility] Provider Note   CSN: [id number] Arrival date & time: 02/06/24  1712     History  Chief Complaint  Patient presents with   Marletta Lor    Kevin [name] is a 73 y.o. male, history of of type 2 diabetes, who presents to the ED secondary to multiple complaints.  Wife at bedside, states patient has had problems urinating, or having frequent urination, this been going on for the last 3 months.  He states she states he is urinating everywhere, but he denies any low back pain, or numbness or tingling of his extremities.  He has had this prior to his fall that occurred 3 days ago.  Notes that he has increased thirst, and no taste as well.  Denies any shortness of breath, cough, fever, confusion, nausea, vomiting, or abdominal pain.  Also states that he fell 3 days ago, and he is having some right shoulder pain.  States that he excellently fell, while he felt like he had to urinate, go to the bathroom, and fell onto his chest, and states he has a bad right shoulder pain.  Also some neck pain.  Denies any kind of loss of sensation in the arm, but feels like it is buzzing. Home Medications Prior to Admission medications   Medication Sig Start Date End Date Taking? Authorizing Provider  Continuous Glucose Sensor (FREESTYLE LIBRE 3 SENSOR) MISC Use to check blood sugar continuously throughout the day. Change sensors once every 14 day. Patient not taking: Reported on 02/06/2024 07/07/23   Marcine Matar, MD  glucose blood [facility] VERIO) test strip Use to check blood sugar once daily. Please schedule visit with Dr. Laural Benes. Patient not taking: Reported on 02/06/2024 04/08/23   Marcine Matar, MD  imatinib (GLEEVEC) 100 MG tablet Take 2 tablets (200 mg total) by mouth daily. Take with meals and large glass of water.Caution:Chemotherapy Patient not taking: Reported on 02/06/2024 01/02/23   Ladene Artist, MD  insulin glargine (LANTUS SOLOSTAR) 100  UNIT/ML Solostar Pen Inject 20 Units into the skin daily. Patient not taking: Reported on 02/06/2024 07/07/23   Marcine Matar, MD  Insulin Pen Needle 31G X 5 MM MISC Use with insulin daily. 04/03/23   Anders Simmonds, PA-C  lisinopril (ZESTRIL) 10 MG tablet Take 1 tablet (10 mg total) by mouth daily. Patient not taking: Reported on 02/06/2024 10/01/23   Marcine Matar, MD  metFORMIN (GLUCOPHAGE) 500 MG tablet Take 1 tablet (500 mg total) by mouth 2 (two) times daily with a meal. Patient not taking: Reported on 02/06/2024 04/03/23   Anders Simmonds, PA-C  ondansetron (ZOFRAN-ODT) 4 MG disintegrating tablet Take 1 tablet (4 mg total) by mouth every 6 (six) hours as needed for nausea. Patient not taking: Reported on 01/10/2023 06/05/22   Fritzi Mandes, MD      Allergies    Patient has no known allergies.    Review of Systems   Review of Systems  Constitutional:  Negative for fever.  Genitourinary:  Positive for frequency.    Physical Exam Updated Vital Signs BP (!) 198/104   Pulse 72   Temp 98.1 F (36.7 C)   Resp 16   Ht 5\' 2"  (1.575 m)   Wt 59.8 kg   SpO2 100%   BMI 24.11 kg/m  Physical Exam Vitals and nursing note reviewed.  Constitutional:      General: He is not in acute distress.    Appearance:  He is well-developed.     Comments: Dry mouth  HENT:     Head: Normocephalic and atraumatic.  Eyes:     Conjunctiva/sclera: Conjunctivae normal.  Cardiovascular:     Rate and Rhythm: Normal rate and regular rhythm.     Heart sounds: No murmur heard. Pulmonary:     Effort: Pulmonary effort is normal. No respiratory distress.     Breath sounds: Normal breath sounds.  Abdominal:     Palpations: Abdomen is soft.     Tenderness: There is no abdominal tenderness.  Musculoskeletal:        General: No swelling.     Cervical back: Neck supple.     Comments: Cervical spine, tenderness to palpation of midline, however no ecchymosis, or wounds present.  He has tenderness to  palpation of the right trapezius, as well as right scapula, his shoulder appears very squared off, with more pronounced right humeral head.  Good strength of bilateral upper extremities however, right upper extremity appears spastic, 5 out of 5 strength bilateral lower extremities.  Skin:    General: Skin is warm and dry.     Capillary Refill: Capillary refill takes less than 2 seconds.  Neurological:     Mental Status: He is alert.  Psychiatric:        Mood and Affect: Mood normal.     ED Results / Procedures / Treatments   Labs (all labs ordered are listed, but only abnormal results are displayed) Labs Reviewed  COMPREHENSIVE METABOLIC PANEL - Abnormal; Notable for the following components:      Result Value   Sodium 122 (*)    Chloride 82 (*)    CO2 21 (*)    Glucose, Bld 878 (*)    BUN 34 (*)    Creatinine, Ser 2.08 (*)    Albumin 3.3 (*)    Alkaline Phosphatase 153 (*)    Total Bilirubin 1.3 (*)    GFR, Estimated 33 (*)    Anion gap 19 (*)    All other components within normal limits  CBC - Abnormal; Notable for the following components:   RBC 4.04 (*)    Hemoglobin 12.9 (*)    HCT 37.8 (*)    All other components within normal limits  URINALYSIS, ROUTINE W REFLEX MICROSCOPIC - Abnormal; Notable for the following components:   Color, Urine STRAW (*)    Glucose, UA >=500 (*)    Hgb urine dipstick Emelynn Rance (*)    Ketones, ur 20 (*)    All other components within normal limits  BETA-HYDROXYBUTYRIC ACID - Abnormal; Notable for the following components:   Beta-Hydroxybutyric Acid 3.99 (*)    All other components within normal limits  OSMOLALITY - Abnormal; Notable for the following components:   Osmolality 326 (*)    All other components within normal limits  CBG MONITORING, ED - Abnormal; Notable for the following components:   Glucose-Capillary >600 (*)    All other components within normal limits  LIPASE, BLOOD  BLOOD GAS, VENOUS  BASIC METABOLIC PANEL  BASIC  METABOLIC PANEL  BASIC METABOLIC PANEL  BETA-HYDROXYBUTYRIC ACID  BETA-HYDROXYBUTYRIC ACID  TSH  COMPREHENSIVE METABOLIC PANEL  CBC  I-STAT CG4 LACTIC ACID, ED  I-STAT VENOUS BLOOD GAS, ED  I-STAT CG4 LACTIC ACID, ED    EKG None  Radiology CT CHEST ABDOMEN PELVIS WO CONTRAST Result Date: 02/06/2024 CLINICAL DATA:  Poly trauma, blunt.  Trauma due to a fall. EXAM: CT CHEST, ABDOMEN AND PELVIS WITHOUT CONTRAST  TECHNIQUE: Multidetector CT imaging of the chest, abdomen and pelvis was performed following the standard protocol without IV contrast. RADIATION DOSE REDUCTION: This exam was performed according to the departmental dose-optimization program which includes automated exposure control, adjustment of the mA and/or kV according to patient size and/or use of iterative reconstruction technique. COMPARISON:  Chest radiograph 06/05/2022. CT abdomen and pelvis 06/05/2022. FINDINGS: CT CHEST FINDINGS Cardiovascular: Normal heart size. No pericardial effusions. Normal caliber thoracic aorta. Minimal calcification in the aorta. Coronary artery calcifications. Mediastinum/Nodes: No mediastinal gas or hematoma. Esophagus is decompressed. No significant lymphadenopathy. Multiple thyroid nodules, largest on the right measuring 3.1 cm diameter. Lungs/Pleura: Lungs are clear. No pleural effusion or pneumothorax. Few scattered subcentimeter calcified granulomas are present. No significant pulmonary nodules. No imaging follow-up is indicated. Musculoskeletal: Degenerative changes in the spine. No acute bony abnormalities. CT ABDOMEN PELVIS FINDINGS Hepatobiliary: Poorly defined low-attenuation lesion in the dome of the liver measuring 1.8 cm diameter. This is new since previous study. Consider follow-up with elective MRI to exclude a developing liver mass. Gallbladder and bile ducts are unremarkable. Pancreas: Unremarkable. No pancreatic ductal dilatation or surrounding inflammatory changes. Spleen: Spleen is  surgically absent. Adrenals/Urinary Tract: No adrenal gland nodules. Left renal cysts are unchanged since prior study. No imaging follow-up is indicated. Bilateral hydronephrosis and hydroureter. No obstructing stone or mass is identified. The bladder is diffusely fluid distended. Changes likely to represent reflux or stasis, possibly associated with bladder outlet obstruction. No bladder wall thickening or filling defect. Stomach/Bowel: Postoperative changes in the stomach. Stomach, [place] bowel, and colon are not abnormally distended. No wall thickening or inflammatory changes. Appendix is normal. Vascular/Lymphatic: No significant vascular findings are present. No enlarged abdominal or pelvic lymph nodes. Reproductive: Prostate gland is enlarged, measuring 6.4 cm diameter. Other: No free air or free fluid in the abdomen. Abdominal wall musculature appears intact. No mesenteric hematomas. Musculoskeletal: Degenerative changes in the lumbar spine. No acute bony abnormalities. IMPRESSION: 1. No acute posttraumatic changes demonstrated in the chest, abdomen, or pelvis. 2. Multiple thyroid nodules, most prominent a 3.1 cm calcified nodule in the right. Recommend follow-up with elective thyroid US (Ref: J Am Coll Radiol. 2015 Feb;12(2): 143-50). 3. Prominent bladder distention with bilateral hydronephrosis and hydroureter likely representing stasis or reflux, likely associated with bladder outlet obstruction. Prostate gland is diffusely enlarged. 4. Suggestion of developing hypodense nodule in the liver measuring 1.8 cm diameter. Consider elective MRI follow-up further characterization. Electronically Signed   By: Burman Nieves M.D.   On: 02/06/2024 20:54   CT Cervical Spine Wo Contrast Result Date: 02/06/2024 CLINICAL DATA:  Fall 2-3 days ago.  Patient has been down since. EXAM: CT CERVICAL SPINE WITHOUT CONTRAST TECHNIQUE: Multidetector CT imaging of the cervical spine was performed without intravenous contrast.  Multiplanar CT image reconstructions were also generated. RADIATION DOSE REDUCTION: This exam was performed according to the departmental dose-optimization program which includes automated exposure control, adjustment of the mA and/or kV according to patient size and/or use of iterative reconstruction technique. COMPARISON:  None Available. FINDINGS: Alignment: No significant listhesis is present. Normal cervical lordosis is present. Skull base and vertebrae: The vertebral body heights are normal. No acute or healing fractures are present. Soft tissues and spinal canal: A calcified nodule the right lobe of the thyroid measures 3.4 x 3.0 x 4.3 cm. Additional nodules are present in the isthmus and inferior left lobe. This displaces the airway slightly to the left. No significant adenopathy is present. Disc levels: Multilevel right-sided facet  and uncovertebral spurring leads to moderate right foraminal stenosis at C4-5, C5-6 and C6-7. Upper chest: The lung apices are clear. The thoracic inlet is within normal limits. IMPRESSION: 1. No acute or healing fractures of the cervical spine. 2. Multilevel right-sided facet and uncovertebral spurring leads to moderate right foraminal stenosis at C4-5, C5-6 and C6-7. 3. 3.4 x 3.0 x 4.3 cm calcified nodule in the right lobe of the thyroid. Additional nodules are present in the isthmus and inferior left lobe. Recommend non-emergent thyroid ultrasound. Reference: J Am Coll Radiol. 2015 Feb;12(2): 143-50 Electronically Signed   By: Marin Roberts M.D.   On: 02/06/2024 20:43   CT Head Wo Contrast Result Date: 02/06/2024 CLINICAL DATA:  Head trauma. Patient fell 2-3 days ago and has been down since. EXAM: CT HEAD WITHOUT CONTRAST TECHNIQUE: Contiguous axial images were obtained from the base of the skull through the vertex without intravenous contrast. RADIATION DOSE REDUCTION: This exam was performed according to the departmental dose-optimization program which includes  automated exposure control, adjustment of the mA and/or kV according to patient size and/or use of iterative reconstruction technique. COMPARISON:  None Available. FINDINGS: Brain: Mild atrophy and white matter changes are within normal limits for age. No acute infarct, hemorrhage, or mass lesion is present. Deep brain nuclei are within normal limits. The ventricles are of normal size. No significant extraaxial fluid collection is present. The brainstem and cerebellum are within normal limits. Midline structures are within normal limits. Vascular: Atherosclerotic calcifications are present within the cavernous internal carotid arteries bilaterally. No hyperdense vessel is present. Skull: Calvarium is intact. No focal lytic or blastic lesions are present. No significant extracranial soft tissue lesion is present. Sinuses/Orbits: The paranasal sinuses and mastoid air cells are clear. The globes and orbits are within normal limits. IMPRESSION: 1. No acute intracranial abnormality or significant traumatic injury. 2. Mild atrophy and white matter changes are within normal limits for age. Electronically Signed   By: Marin Roberts M.D.   On: 02/06/2024 20:39   DG Shoulder Right Portable Result Date: 02/06/2024 CLINICAL DATA:  Fall.  Pain. EXAM: RIGHT SHOULDER - 1 VIEW COMPARISON:  None Available. FINDINGS: Visualized portion of the right hemithorax is normal. No acute fracture or dislocation. Degenerative changes of the glenohumeral and acromioclavicular joints are moderate. IMPRESSION: Degenerative change, without acute osseous finding. Electronically Signed   By: Jeronimo Greaves M.D.   On: 02/06/2024 19:59    Procedures Procedures    Medications Ordered in ED Medications  lactated ringers infusion (has no administration in time range)  dextrose 5 % in lactated ringers infusion (has no administration in time range)  dextrose 50 % solution 0-50 mL (has no administration in time range)  labetalol  (NORMODYNE) injection 10 mg (has no administration in time range)  insulin regular, human (MYXREDLIN) 100 units/ 100 mL infusion (has no administration in time range)  labetalol (NORMODYNE) injection 10 mg (has no administration in time range)  heparin injection 5,000 Units (has no administration in time range)  sodium chloride flush (NS) 0.9 % injection 3 mL (has no administration in time range)  sodium chloride flush (NS) 0.9 % injection 3 mL (has no administration in time range)  sodium chloride flush (NS) 0.9 % injection 3 mL (has no administration in time range)  0.9 %  sodium chloride infusion (has no administration in time range)  acetaminophen (TYLENOL) tablet 650 mg (has no administration in time range)    Or  acetaminophen (TYLENOL) suppository 650 mg (  has no administration in time range)  ondansetron (ZOFRAN) tablet 4 mg (has no administration in time range)    Or  ondansetron (ZOFRAN) injection 4 mg (has no administration in time range)  diazepam (VALIUM) tablet 2 mg (2 mg Oral Given 02/06/24 2120)    ED Course/ Medical Decision Making/ A&P                                 Medical Decision Making Patient is a 73 year old male, poor historian, here for multiple complaints including a fall that occurred 3 days ago, where he does not know if he hit his head, and fell on his right chest, has been having neck pain, and right shoulder pain, since then.  Will obtain a CT chest, abdomen, pelvis, head, and neck, given poor historian, and trauma.  Additionally we will obtain VBG, CBC, CMP, beta hydroxybutyrate, given concern for possible DKA, given increased frequency, of urination, as well as dry mouth, and sweet smelling patient.  CT of neck, ordered given patient's spasticity, of the right upper extremity.  X-ray of portable left shoulder, given pronounced humeral head.  Amount and/or Complexity of Data Reviewed Labs: ordered.    Details: Creatinine of 2.08, glucose of 878, beta hydroxy  butyrate, 3.99, osmolarity, 300+ Radiology: ordered.    Details: CT shows right foraminal stenosis C4/C5/C6/C7, as well as calcified nodule of the right lobe of thyroid.  Findings concerning for bladder outlet obstruction with large prostate, and hydronephrosis Discussion of management or test interpretation with external provider(s): Patient with AKI and HHS. Findings concerning for bladder outlet obstruction, will likely need Foley catheter placed, discussed with Dr. Janalyn Shy, who accepts admission of patient.  Also suspicious for HHS, given elevated glucose in the 800s, elevated beta hydroxybutyrate, without an anion gap, and elevated osmolarity.  Patient started on insulin drip, she will further manage.  Patient is not altered, he is mildly hypertensive, has not taken his meds today, labetalol ordered.  Abnormal findings, the right shoulder, likely secondary to cervical radiculopathy, which is causing the pain.  Improved with Valium.  I think it is very spastic, given the nerve pain, this will likely need to be managed outpatient, pain meds inpatient, while here.  No evidence of any shoulder dislocation, or any fractures from the fall  Risk Prescription drug management. Decision regarding hospitalization.   CRITICAL CARE Performed by: Pete Pelt   Total critical care time: 60 minutes  Critical care time was exclusive of separately billable procedures and treating other patients.  Critical care was necessary to treat or prevent imminent or life-threatening deterioration.  Critical care was time spent personally by me on the following activities: development of treatment plan with patient and/or surrogate as well as nursing, discussions with consultants, evaluation of patient's response to treatment, examination of patient, obtaining history from patient or surrogate, ordering and performing treatments and interventions, ordering and review of laboratory studies, ordering and review of  radiographic studies, pulse oximetry and re-evaluation of patient's condition.  Final Clinical Impression(s) / ED Diagnoses Final diagnoses:  Type 2 diabetes mellitus with hyperosmolar hyperglycemic state (HHS) (HCC)  Cervical radiculopathy  Urinary retention  AKI (acute kidney injury) [facility])    Rx / DC Orders ED Discharge Orders     None         Martino Tompson, [place], PA 02/06/24 2157    Alvira Monday, MD 02/07/24 1524

## 2024-02-06 NOTE — ED Triage Notes (Signed)
 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

## 2024-02-06 NOTE — ED Notes (Signed)
 Lab has called a glucose of 878  charge notified acuity increased

## 2024-02-06 NOTE — H&P (Addendum)
 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 male with medical history significant of insulin-dependent DM type II, history of gastrointestinal internal stromal tumor, essential hypertension, chronic muscle spasm, chronic hypokalemia, chronic iron deficiency anemia, GERD, protein calorie malnutrition and history of noncompliance with insulin regimen and multiple episodes of hypoglycemia in the past presented to emergency department with complaining of unwitnessed fall 2 to 3 days ago.  During my evaluation at the bedside patient reported that mild headache and right-sided shoulder pain and he noticed unintentional movement of the right sided forearm and hand for last 3 days since patient had a fall.  Patient also complaining about lower abdominal pain and distention and urinary dribbling with poor urinary output.  Noncompliant with all home medications along with insulin regimen.  Unable to verify for been noncompliance. Denies any chest pain, palpitation, shortness of breath, nausea, vomiting, constipation and diarrhea. In general patient is a poor historian.  ED Course:  At presentation to ED patient found to have elevated blood pressure 203/95.  Heart rate 66, O2 sat 100% room air and respiratory 21.  Elevated blood glucose above 600. Increased from osmolarity 326. Elevated beta-hydroxybutyrate level 2.99. Pending VBG.  CMP showing low sodium 122, chloride 82, low bicarb 21, elevated blood glucose around 900, low bicarb 21, elevated anion gap 19, BUN 34, elevated  creatinine 2.02, low albumin 3.3, elevated bilirubin 3.3, elevated alkaline phosphatase 153. Corrected sodium with elevated blood glucose is 134. CBC unremarkable stable H&H 12.9 and 37. Normal lipase level 22.  UA showed straw-colored appearance, blood glucose of 500, he dipstick hemoglobin positive and ketone 20.  EKG showing sinus rhythm, left ventricular hypertrophy pattern with secondary repolarization abnormality.  In the ED patient has been started on insulin drip for hyperglycemia protocol.  ED provider reported that patient is a poor historian.  He is Hebel dribbling of urination but not able to void his bladder completely.  Patient is not also being compliant with insulin regimen as well.  Due to the fall multiple imaging has been done.  X-ray of the right shoulder was done which showed no evidence of fracture.  Degenerative changes.  CT head no acute intracranial abnormality.  Mild atrophy and white matter changes.  CT cervical spine no acute fracture or dislocation.  Multifaceted level foraminal stenosis. There is also a  3.4 x 3.0 x 4.3 cm calcified nodule in the right lobe of the thyroid. Additional nodules are present in the isthmus and inferior left lobe. Recommend non-emergent thyroid ultrasound.   CT chest abdomen pelvis no acute posttraumatic hemorrhage of the chest, abdomen and pelvis. It showed multiple thyroid nodule.  Prominent bladder distention with bilateral hydronephrosis and hydroureter likely representing stasis or reflux, likely associated with bladder outlet obstruction. Prostate gland is diffusely enlarged. Suggestion of developing hypodense nodule in the liver measuring 1.8 cm diameter. Consider elective MRI follow-up further characterization.  Patient and family reported not taking any blood pressure regimen neither insulin in  fact patient is not taking any medication at all which is listed on his home medication list.   Hospitalist has been  contacted for further management of HHS versus DKA, hypertensive emergency, and AKI.   Significant labs in the ED: Lab Orders         Lipase, blood         Comprehensive metabolic panel         CBC         Urinalysis, Routine w reflex microscopic -Urine, Clean Catch         Blood gas, venous         Beta-hydroxybutyric acid         Osmolality         Basic metabolic panel         Beta-hydroxybutyric acid         TSH         Comprehensive metabolic panel         CBC         Sodium, urine, random         Creatinine, urine, random         T4, free         Hemoglobin A1c         CBG monitoring, ED         CBG monitoring, ED         I-Stat CG4 Lactic Acid         I-Stat venous blood gas, ([facility] ED, [facility], [facility])         CBG monitoring, ED       Review of Systems:  Review of Systems  Constitutional:  Negative for chills, fever, malaise/fatigue and weight loss.  Eyes:  Negative for blurred vision.  Respiratory:  Negative for cough, sputum production and shortness of breath.   Cardiovascular:  Negative for chest pain, palpitations and leg swelling.  Gastrointestinal:  Positive for abdominal pain. Negative for blood in stool, constipation, diarrhea, heartburn, nausea and vomiting.  Genitourinary:        Dribbling of the urine and unable to void bladder  Musculoskeletal:  Negative for myalgias.  Neurological:  Negative for dizziness.  Psychiatric/Behavioral:  The patient is not nervous/anxious.   All other systems reviewed and are negative.   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    Past Surgical History:  Procedure Laterality Date   BIOPSY  01/24/2020   Procedure: BIOPSY;  Surgeon: Graylin Shiver, MD;  Location: [facility] ENDOSCOPY;  Service: Endoscopy;;   ESOPHAGOGASTRODUODENOSCOPY (EGD) WITH PROPOFOL N/A 01/24/2020   Procedure: ESOPHAGOGASTRODUODENOSCOPY (EGD) WITH PROPOFOL;   Surgeon: Graylin Shiver, MD;  Location: [facility] ENDOSCOPY;  Service: Endoscopy;  Laterality: N/A;   IR CATHETER TUBE CHANGE  02/18/2020   IR RADIOLOGIST EVAL & MGMT  02/15/2020   IR RADIOLOGIST EVAL & MGMT  03/09/2020   LAPAROSCOPY N/A 05/29/2022   Procedure: LAPAROSCOPY DIAGNOSTIC;  Surgeon: Fritzi Mandes, MD;  Location: [facility] OR;  Service: General;  Laterality: N/A;   PARTIAL GASTRECTOMY N/A 05/29/2022   Procedure: OPEN PARTIAL GASTRECTOMY;  Surgeon: Fritzi Mandes, MD;  Location: [facility] OR;  Service: General;  Laterality: N/A;   SCLEROTHERAPY  01/24/2020   Procedure: Susa Day;  Surgeon: Graylin Shiver, MD;  Location: [facility] ENDOSCOPY;  Service: Endoscopy;;   SPLENECTOMY, TOTAL N/A 05/29/2022   Procedure: SPLENECTOMY;  Surgeon: Sophronia Simas  L, MD;  Location: [facility] OR;  Service: General;  Laterality: N/A;     reports that he quit smoking about 27 years ago. His smoking use included cigarettes. He has never used smokeless tobacco. He reports that he does not currently use alcohol. He reports that he does not currently use drugs.  No Known Allergies  Family History  Problem Relation Age of Onset   Hypertension Other     Prior to Admission medications   Medication Sig Start Date End Date Taking? Authorizing Provider  acetaminophen (TYLENOL) 500 MG tablet Take 1,000 mg by mouth every 6 (six) hours as needed for mild pain. Patient not taking: Reported on 03/17/2023    [provider]  amLODipine (NORVASC) 10 MG tablet Take 1 tablet (10 mg total) by mouth daily. Patient not taking: Reported on 03/17/2023 11/06/22   Marcine Matar, MD  Blood Glucose Monitoring Suppl [facility] VERIO) w/Device KIT Use to check blood sugar once daily. Please schedule visit with Dr. Laural Benes. Patient not taking: Reported on 03/17/2023 01/15/23   Marcine Matar, MD  Continuous Glucose Sensor (FREESTYLE LIBRE 3 SENSOR) MISC Use to check blood sugar continuously throughout the day. Change sensors once every 14 day.  07/07/23   Marcine Matar, MD  docusate sodium (COLACE) 100 MG capsule Take 1 capsule (100 mg total) by mouth 2 (two) times daily. Patient not taking: Reported on 10/24/2022 06/05/22   Fritzi Mandes, MD  glucose blood [facility] VERIO) test strip Use to check blood sugar once daily. Please schedule visit with Dr. Laural Benes. 04/08/23   Marcine Matar, MD  imatinib (GLEEVEC) 100 MG tablet Take 2 tablets (200 mg total) by mouth daily. Take with meals and large glass of water.Caution:Chemotherapy 01/02/23   Ladene Artist, MD  insulin glargine (LANTUS SOLOSTAR) 100 UNIT/ML Solostar Pen Inject 20 Units into the skin daily. 07/07/23   Marcine Matar, MD  Insulin Pen Needle 31G X 5 MM MISC Use with insulin daily. 04/03/23   Anders Simmonds, PA-C  Lancets (ONETOUCH DELICA PLUS [place]) MISC Use to check blood sugar once daily. Please schedule visit with Dr. Laural Benes. Patient not taking: Reported on 03/17/2023 01/15/23   Marcine Matar, MD  lisinopril (ZESTRIL) 10 MG tablet Take 1 tablet (10 mg total) by mouth daily. 10/01/23   Marcine Matar, MD  metFORMIN (GLUCOPHAGE) 500 MG tablet Take 1 tablet (500 mg total) by mouth 2 (two) times daily with a meal. 04/03/23   McClung, Marzella Schlein, PA-C  methocarbamol (ROBAXIN) 500 MG tablet Take 1 tablet (500 mg total) by mouth every 6 (six) hours as needed for muscle spasms. Patient not taking: Reported on 01/10/2023 06/05/22   Fritzi Mandes, MD  ondansetron (ZOFRAN-ODT) 4 MG disintegrating tablet Take 1 tablet (4 mg total) by mouth every 6 (six) hours as needed for nausea. Patient not taking: Reported on 01/10/2023 06/05/22   Fritzi Mandes, MD  potassium chloride SA (KLOR-CON M) 20 MEQ tablet Take 1 tablet (20 mEq total) by mouth daily. Patient not taking: Reported on 03/17/2023 08/02/22   Rana Snare, NP     Physical Exam: Vitals:   02/06/24 1723 02/06/24 1726 02/06/24 2014 02/06/24 2130  BP:  (!) 203/95 (!) 192/101 (!) 198/104  Pulse:  73 82 72   Resp:  16 (!) 21 16  Temp:  98.1 F (36.7 C)    SpO2:  99% 100% 100%  Weight: 59.8 kg     Height: 5'  2" (1.575 m)       Physical Exam Vitals and nursing note reviewed.  Constitutional:      General: He is not in acute distress.    Appearance: He is ill-appearing. He is not toxic-appearing or diaphoretic.  HENT:     Mouth/Throat:     Mouth: Mucous membranes are moist.  Cardiovascular:     Rate and Rhythm: Normal rate and regular rhythm.     Pulses: Normal pulses.     Heart sounds: Normal heart sounds.  Pulmonary:     Effort: Pulmonary effort is normal.     Breath sounds: Normal breath sounds.  Abdominal:     General: There is distension.     Palpations: Abdomen is soft.     Tenderness: There is no abdominal tenderness. There is no guarding or rebound.     Comments: Suprapubic distention and bladder fullness  Musculoskeletal:     Cervical back: Neck supple.     Right lower leg: No edema.     Left lower leg: No edema.  Skin:    General: Skin is dry.     Capillary Refill: Capillary refill takes less than 2 seconds.     Findings: No lesion or rash.  Neurological:     Mental Status: He is alert and oriented to person, place, and time.  Psychiatric:        Mood and Affect: Mood normal.        Thought Content: Thought content normal.      Labs on Admission: I have personally reviewed following labs and imaging studies  CBC: Recent Labs  Lab 02/06/24 1729  WBC 5.4  HGB 12.9*  HCT 37.8*  MCV 93.6  PLT 256   Basic Metabolic Panel: Recent Labs  Lab 02/06/24 1729  NA 122*  K 4.3  CL 82*  CO2 21*  GLUCOSE 878*  BUN 34*  CREATININE 2.08*  CALCIUM 9.1   GFR: Estimated Creatinine Clearance: 24.8 mL/min (A) (by C-G formula based on SCr of 2.08 mg/dL (H)). Liver Function Tests: Recent Labs  Lab 02/06/24 1729  AST 23  ALT 20  ALKPHOS 153*  BILITOT 1.3*  PROT 7.8  ALBUMIN 3.3*   Recent Labs  Lab 02/06/24 1729  LIPASE 24   No results for input(s):  "AMMONIA" in the last 168 hours. Coagulation Profile: No results for input(s): "INR", "PROTIME" in the last 168 hours. Cardiac Enzymes: No results for input(s): "CKTOTAL", "CKMB", "CKMBINDEX", "TROPONINI", "TROPONINIHS" in the last 168 hours. BNP (last 3 results) No results for input(s): "BNP" in the last 8760 hours. HbA1C: No results for input(s): "HGBA1C" in the last 72 hours. CBG: Recent Labs  Lab 02/06/24 2103 02/06/24 2200  GLUCAP >600* >600*   Lipid Profile: No results for input(s): "CHOL", "HDL", "LDLCALC", "TRIG", "CHOLHDL", "LDLDIRECT" in the last 72 hours. Thyroid Function Tests: No results for input(s): "TSH", "T4TOTAL", "FREET4", "T3FREE", "THYROIDAB" in the last 72 hours. Anemia Panel: No results for input(s): "VITAMINB12", "FOLATE", "FERRITIN", "TIBC", "IRON", "RETICCTPCT" in the last 72 hours. Urine analysis:    Component Value Date/Time   COLORURINE STRAW (A) 02/06/2024 2011   APPEARANCEUR CLEAR 02/06/2024 2011   LABSPEC 1.018 02/06/2024 2011   PHURINE 5.0 02/06/2024 2011   GLUCOSEU >=500 (A) 02/06/2024 2011   HGBUR SMALL (A) 02/06/2024 2011   BILIRUBINUR NEGATIVE 02/06/2024 2011   BILIRUBINUR negative 04/03/2023 1659   KETONESUR 20 (A) 02/06/2024 2011   PROTEINUR NEGATIVE 02/06/2024 2011   UROBILINOGEN 0.2 04/03/2023 1659  NITRITE NEGATIVE 02/06/2024 2011   LEUKOCYTESUR NEGATIVE 02/06/2024 2011    Radiological Exams on Admission: I have personally reviewed images CT CHEST ABDOMEN PELVIS WO CONTRAST Result Date: 02/06/2024 CLINICAL DATA:  Poly trauma, blunt.  Trauma due to a fall. EXAM: CT CHEST, ABDOMEN AND PELVIS WITHOUT CONTRAST TECHNIQUE: Multidetector CT imaging of the chest, abdomen and pelvis was performed following the standard protocol without IV contrast. RADIATION DOSE REDUCTION: This exam was performed according to the departmental dose-optimization program which includes automated exposure control, adjustment of the mA and/or kV according to  patient size and/or use of iterative reconstruction technique. COMPARISON:  Chest radiograph 06/05/2022. CT abdomen and pelvis 06/05/2022. FINDINGS: CT CHEST FINDINGS Cardiovascular: Normal heart size. No pericardial effusions. Normal caliber thoracic aorta. Minimal calcification in the aorta. Coronary artery calcifications. Mediastinum/Nodes: No mediastinal gas or hematoma. Esophagus is decompressed. No significant lymphadenopathy. Multiple thyroid nodules, largest on the right measuring 3.1 cm diameter. Lungs/Pleura: Lungs are clear. No pleural effusion or pneumothorax. Few scattered subcentimeter calcified granulomas are present. No significant pulmonary nodules. No imaging follow-up is indicated. Musculoskeletal: Degenerative changes in the spine. No acute bony abnormalities. CT ABDOMEN PELVIS FINDINGS Hepatobiliary: Poorly defined low-attenuation lesion in the dome of the liver measuring 1.8 cm diameter. This is new since previous study. Consider follow-up with elective MRI to exclude a developing liver mass. Gallbladder and bile ducts are unremarkable. Pancreas: Unremarkable. No pancreatic ductal dilatation or surrounding inflammatory changes. Spleen: Spleen is surgically absent. Adrenals/Urinary Tract: No adrenal gland nodules. Left renal cysts are unchanged since prior study. No imaging follow-up is indicated. Bilateral hydronephrosis and hydroureter. No obstructing stone or mass is identified. The bladder is diffusely fluid distended. Changes likely to represent reflux or stasis, possibly associated with bladder outlet obstruction. No bladder wall thickening or filling defect. Stomach/Bowel: Postoperative changes in the stomach. Stomach, small bowel, and colon are not abnormally distended. No wall thickening or inflammatory changes. Appendix is normal. Vascular/Lymphatic: No significant vascular findings are present. No enlarged abdominal or pelvic lymph nodes. Reproductive: Prostate gland is enlarged,  measuring 6.4 cm diameter. Other: No free air or free fluid in the abdomen. Abdominal wall musculature appears intact. No mesenteric hematomas. Musculoskeletal: Degenerative changes in the lumbar spine. No acute bony abnormalities. IMPRESSION: 1. No acute posttraumatic changes demonstrated in the chest, abdomen, or pelvis. 2. Multiple thyroid nodules, most prominent a 3.1 cm calcified nodule in the right. Recommend follow-up with elective thyroid US (Ref: J Am Coll Radiol. 2015 Feb;12(2): 143-50). 3. Prominent bladder distention with bilateral hydronephrosis and hydroureter likely representing stasis or reflux, likely associated with bladder outlet obstruction. Prostate gland is diffusely enlarged. 4. Suggestion of developing hypodense nodule in the liver measuring 1.8 cm diameter. Consider elective MRI follow-up further characterization. Electronically Signed   By: Burman Nieves M.D.   On: 02/06/2024 20:54   CT Cervical Spine Wo Contrast Result Date: 02/06/2024 CLINICAL DATA:  Fall 2-3 days ago.  Patient has been down since. EXAM: CT CERVICAL SPINE WITHOUT CONTRAST TECHNIQUE: Multidetector CT imaging of the cervical spine was performed without intravenous contrast. Multiplanar CT image reconstructions were also generated. RADIATION DOSE REDUCTION: This exam was performed according to the departmental dose-optimization program which includes automated exposure control, adjustment of the mA and/or kV according to patient size and/or use of iterative reconstruction technique. COMPARISON:  None Available. FINDINGS: Alignment: No significant listhesis is present. Normal cervical lordosis is present. Skull base and vertebrae: The vertebral body heights are normal. No acute or healing fractures are  present. Soft tissues and spinal canal: A calcified nodule the right lobe of the thyroid measures 3.4 x 3.0 x 4.3 cm. Additional nodules are present in the isthmus and inferior left lobe. This displaces the airway  slightly to the left. No significant adenopathy is present. Disc levels: Multilevel right-sided facet and uncovertebral spurring leads to moderate right foraminal stenosis at C4-5, C5-6 and C6-7. Upper chest: The lung apices are clear. The thoracic inlet is within normal limits. IMPRESSION: 1. No acute or healing fractures of the cervical spine. 2. Multilevel right-sided facet and uncovertebral spurring leads to moderate right foraminal stenosis at C4-5, C5-6 and C6-7. 3. 3.4 x 3.0 x 4.3 cm calcified nodule in the right lobe of the thyroid. Additional nodules are present in the isthmus and inferior left lobe. Recommend non-emergent thyroid ultrasound. Reference: J Am Coll Radiol. 2015 Feb;12(2): 143-50 Electronically Signed   By: Marin Roberts M.D.   On: 02/06/2024 20:43   CT Head Wo Contrast Result Date: 02/06/2024 CLINICAL DATA:  Head trauma. Patient fell 2-3 days ago and has been down since. EXAM: CT HEAD WITHOUT CONTRAST TECHNIQUE: Contiguous axial images were obtained from the base of the skull through the vertex without intravenous contrast. RADIATION DOSE REDUCTION: This exam was performed according to the departmental dose-optimization program which includes automated exposure control, adjustment of the mA and/or kV according to patient size and/or use of iterative reconstruction technique. COMPARISON:  None Available. FINDINGS: Brain: Mild atrophy and white matter changes are within normal limits for age. No acute infarct, hemorrhage, or mass lesion is present. Deep brain nuclei are within normal limits. The ventricles are of normal size. No significant extraaxial fluid collection is present. The brainstem and cerebellum are within normal limits. Midline structures are within normal limits. Vascular: Atherosclerotic calcifications are present within the cavernous internal carotid arteries bilaterally. No hyperdense vessel is present. Skull: Calvarium is intact. No focal lytic or blastic lesions  are present. No significant extracranial soft tissue lesion is present. Sinuses/Orbits: The paranasal sinuses and mastoid air cells are clear. The globes and orbits are within normal limits. IMPRESSION: 1. No acute intracranial abnormality or significant traumatic injury. 2. Mild atrophy and white matter changes are within normal limits for age. Electronically Signed   By: Marin Roberts M.D.   On: 02/06/2024 20:39   DG Shoulder Right Portable Result Date: 02/06/2024 CLINICAL DATA:  Fall.  Pain. EXAM: RIGHT SHOULDER - 1 VIEW COMPARISON:  None Available. FINDINGS: Visualized portion of the right hemithorax is normal. No acute fracture or dislocation. Degenerative changes of the glenohumeral and acromioclavicular joints are moderate. IMPRESSION: Degenerative change, without acute osseous finding. Electronically Signed   By: Jeronimo Greaves M.D.   On: 02/06/2024 19:59     EKG: My personal interpretation of EKG shows: EKG showing normal sinus rhythm heart rate 63.  Right ventricular repolarization abnormality.    Assessment/Plan: Principal Problem:   Type 2 diabetes mellitus with hyperosmolar hyperglycemic state (HHS) (HCC) Active Problems:   Insulin dependent type 2 diabetes mellitus (HCC)   Hyperosmolar hyponatremia   Hypertensive emergency   BPH (benign prostatic hyperplasia)   Bilateral hydronephrosis   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 (HCC)   Thyroid nodule   Essential hypertension   Chronic anemia   GERD (gastroesophageal reflux disease)   History of fall    Assessment and Plan: Hyperosmolar hyperglycemic state Hyponatremia secondary to hyperglycemia History of insulin-dependent DM type II with insulin noncompliance. History of recurrent hyperglycemic  crisis in the past > Patient presented to emergency department complaining of fall 2 days ago at home and since then he is having right-sided shoulder spasm.  Patient is not taking neither  insulin, home blood pressure regimen in fact not taking any home medications at all. -Previous A1c above 15 last checked in April 2024. - History of previous hyperglycemic crisis due to insulin noncompliance. - Blood glucose above 600.  CMP showing corrected serum sodium 134, elevated blood glucose 878, low bicarb 21, elevated anion gap 19. Increased serum osmolarity 326.  Elevated beta-hydroxybutyrate 3.99.  Pending VBG. -Concern for HHS versus early development of DKA - In the ED patient has been started on insulin drip for hyperglycemia protocol. - Continue insulin drip with for HHS protocol per Endo tool.  Continue to check BMP every 4 hours and beta-hydroxybutyrate 3 times daily.  If anion gap remains close for consecutive TWO  BMP and bicarb level improves in that case  will transition to long-acting insulin to bridge with insulin drip. -Continue LR 125 cc/h and D5 LR if blood glucose dropped below 250. - Will follow-up with VBG. - Admitting patient to progressive unit.   Hypertensive emergency History essential hypertension-medication noncompliance -On presentation to ED elevated blood pressure 203/95.  After giving labetalol blood pressure has been improved 154/98. - Starting IV hydralazine 10 mg every 6 hour as needed for systolic blood pressure above 161 or diastolic blood pressure above 096.  Goal to decrease blood pressure 25% in next 24 hours. -Of note patient is not taking lisinopril at home. -EKG showing sinus rhythm heart rate 63, left ventricular hypertrophy pattern. -Patient denies any chest pain, chest pressure and shortness of breath. -Starting oral amlodipine. -Continue hydralazine as needed. 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 -See abdomen pelvis showed  prominent bladder distention with bladder ultralong hydronephrosis and hydroureter and enlarged prostate associated with bladder outlet obstruction. -Creatinine 2.08.  Baseline creatinine within normal range GFR above 60. -UA showing increased blood glucose over 500, dipstick hemoglobin positive, ketone 20, normal RBC, normal WBC and no bacteria. - Acute kidney injury secondary to postrenal AKI BPH from bladder outlet obstruction and prerenal acute kidney injury in the setting of HHS. -Placing Foley catheter. - Starting oral Flomax.   Fall at home -Patient presenting with complaining of fall at home 2 to 3 days ago.  Denies any hitting of the head and loss of consciousness.  Extensive imaging has been done which ruled out any acute fracture. - X-ray right sided shoulder degenerative change no fracture. -  CT head no acute intracranial abnormality. - CT cervical spine no fracture or dislocation. - CT chest, abdomen and pelvis no acute abnormal finding. -Continue fall precaution - Consulting PT and OT.  Thyroid nodule -CT cervical spine showed incidental finding of thyroid nodule. -Radiology recommended nonemergent thyroid ultrasound which can be done outpatient setting. - Checking TSH and free T4.   Anemia of chronic disease -CBC showing stable H&H 12.9 and 37.  Continue to monitor  GERD -Not currently taking any Protonix.  History of gastrointestinal stromal tumor -Last clinic visit in oncology clinic in April 2024. -Per chart review oncology note patient was noncompliant with Gleevec which has been resumed on 03/17/2023.  However patient is not taking it currently.  Also patient has been lost to follow-up  with the oncology clinic afterward. -On discharge need to refer to oncology clinic to reestablish care.  Transaminitis Hyperbilirubinemia -CMP showing transaminitis and elevated bilirubin 1.3.  Hyperbilirubinemia in the setting of dehydration from DKA percent blasts HHS. -However  CT abdomen showed Suggestion of developing hypodense nodule in the liver measuring 1.8 cm diameter. Consider elective MRI follow-up further. -Per chart review patient has previous hepatic abscess which has been drained and completed course of antibiotic in the past. -Given patient is afebrile and there is no significant leukocytosis there is no concern for development of hepatic abscess at this time. -Will obtain MRI of the abdomen for to follow-up.  Will wait for 24 hours before the MRI given patient has already 3 CT scan done tonight. (( Per chart review of the oncology note patient has history of liver lesion on the right lobe and previous findings are following -Liver mass consistent with abscess on MRI of the liver from 01/27/2020             -Status post CT-guided drainage of the liver abscess on 01/28/2020-culture positive for Streptococcus intermedius             -IV antibiotics and discharged home on 10-day course of Augmentin             -CT 03/03/2020-clearance of fluid component of liver abscess, unchanged mass posterior to the gastric body, stable left periaortic and aortocaval adenopathy             -Abscess drain removed 03/09/2020             -Liver abscess resolved on CT 06/01/2020 and 09/14/2020))  History of chronic medication noncompliance - Consulting patient transition care team for medication assistance.  DVT prophylaxis:  SQ Heparin Code Status:  Full Code Diet: Currently n.p.o. in the setting of DKA/HHS on insulin drip Family Communication:   Family was present at bedside, at the time of interview. Opportunity was given to ask question and all questions were answered satisfactorily.  Disposition Plan: Continue to monitor improvement of blood glucose level. Consults: Diabetic educator and TOC Admission status:   Inpatient, Step Down Unit  Severity of Illness: The appropriate patient status for this patient is INPATIENT. Inpatient status is judged to be reasonable and  necessary in order to provide the required intensity of service to ensure the patient's safety. The patient's presenting symptoms, physical exam findings, and initial radiographic and laboratory data in the context of their chronic comorbidities is felt to place them at high risk for further clinical deterioration. Furthermore, it is not anticipated that the patient will be medically stable for discharge from the hospital within 2 midnights of admission.   * I certify that at the point of admission it is my clinical judgment that the patient will require inpatient hospital care spanning beyond 2 midnights from the point of admission due to high intensity of service, high risk for further deterioration and high frequency of surveillance required.Marland Kitchen    Tereasa Coop, MD [facility]  How to contact the [facility] Attending or Consulting provider 7A - 7P or covering provider during after hours 7P -7A, for this patient.  Check the care team in [facility] and look for a) attending/consulting TR[facility] provider listed and b) the [facility] team listed Log into [email or web address] and use [facility]'s universal password to access. If you do not have the password, please contact the hospital operator. Locate the [facility] provider you are looking for under [facility] and page to a  number that you can be directly reached. If you still have difficulty reaching the provider, please page the [facility] (Director on Call) for the Hospitalists listed on amion for assistance.  02/06/2024, 10:26 PM

## 2024-02-06 NOTE — Telephone Encounter (Signed)
 Call from Mrs. Hillmer, wife asking if he is due an appointment to see Dr. Truett Perna. Informed that yes, he is. Last seen on 03/2023 and was supposed to return in 4 weeks. She is also asking if he has a PCP, thinks his diabetes is out of control. Provided her the phone # for Dr. Jonah Blue #[phone] to call asap. Will have scheduler call her next week reschedule lab/OV with Dr. Truett Perna. She needs Fridays since this is her only day off.

## 2024-02-07 ENCOUNTER — Inpatient Hospital Stay (HOSPITAL_COMMUNITY): Payer: Medicare (Managed Care)

## 2024-02-07 DIAGNOSIS — I161 Hypertensive emergency: Secondary | ICD-10-CM

## 2024-02-07 DIAGNOSIS — E1100 Type 2 diabetes mellitus with hyperosmolarity without nonketotic hyperglycemic-hyperosmolar coma (NKHHC): Secondary | ICD-10-CM | POA: Diagnosis not present

## 2024-02-07 LAB — ECHOCARDIOGRAM COMPLETE
AR max vel: 3.16 cm2
AV Peak grad: 5.9 mmHg
Ao pk vel: 1.21 m/s
Area-P 1/2: 3.65 cm2
Height: 62 in
S' Lateral: 2.8 cm
Weight: 1873.03 [oz_av]

## 2024-02-07 LAB — BASIC METABOLIC PANEL
Anion gap: 15 (ref 5–15)
Anion gap: 11 (ref 5–15)
Anion gap: 16 — ABNORMAL HIGH (ref 5–15)
BUN: 43 mg/dL — ABNORMAL HIGH (ref 8–23)
BUN: 42 mg/dL — ABNORMAL HIGH (ref 8–23)
BUN: 40 mg/dL — ABNORMAL HIGH (ref 8–23)
CO2: 24 mmol/L (ref 22–32)
CO2: 29 mmol/L (ref 22–32)
CO2: 21 mmol/L — ABNORMAL LOW (ref 22–32)
Calcium: 8.9 mg/dL (ref 8.9–10.3)
Calcium: 8.8 mg/dL — ABNORMAL LOW (ref 8.9–10.3)
Calcium: 9.2 mg/dL (ref 8.9–10.3)
Chloride: 87 mmol/L — ABNORMAL LOW (ref 98–111)
Chloride: 89 mmol/L — ABNORMAL LOW (ref 98–111)
Chloride: 93 mmol/L — ABNORMAL LOW (ref 98–111)
Creatinine, Ser: 1.96 mg/dL — ABNORMAL HIGH (ref 0.61–1.24)
Creatinine, Ser: 1.87 mg/dL — ABNORMAL HIGH (ref 0.61–1.24)
Creatinine, Ser: 1.71 mg/dL — ABNORMAL HIGH (ref 0.61–1.24)
GFR, Estimated: 36 mL/min — ABNORMAL LOW (ref >60)
GFR, Estimated: 38 mL/min — ABNORMAL LOW (ref >60)
GFR, Estimated: 42 mL/min — ABNORMAL LOW (ref >60)
Glucose, Bld: 734 mg/dL (ref 70–99)
Glucose, Bld: 469 mg/dL — ABNORMAL HIGH (ref 70–99)
Glucose, Bld: 390 mg/dL — ABNORMAL HIGH (ref 70–99)
Potassium: 3.1 mmol/L — ABNORMAL LOW (ref 3.5–5.1)
Potassium: 2.8 mmol/L — ABNORMAL LOW (ref 3.5–5.1)
Potassium: 4 mmol/L (ref 3.5–5.1)
Sodium: 126 mmol/L — ABNORMAL LOW (ref 135–145)
Sodium: 129 mmol/L — ABNORMAL LOW (ref 135–145)
Sodium: 130 mmol/L — ABNORMAL LOW (ref 135–145)

## 2024-02-07 LAB — GLUCOSE, CAPILLARY
Glucose-Capillary: >600 mg/dL (ref 70–99)
Glucose-Capillary: 490 mg/dL — ABNORMAL HIGH (ref 70–99)
Glucose-Capillary: 330 mg/dL — ABNORMAL HIGH (ref 70–99)
Glucose-Capillary: 340 mg/dL — ABNORMAL HIGH (ref 70–99)
Glucose-Capillary: 444 mg/dL — ABNORMAL HIGH (ref 70–99)
Glucose-Capillary: 378 mg/dL — ABNORMAL HIGH (ref 70–99)
Glucose-Capillary: 349 mg/dL — ABNORMAL HIGH (ref 70–99)

## 2024-02-07 LAB — MAGNESIUM: Magnesium: 1.7 mg/dL (ref 1.7–2.4)

## 2024-02-07 LAB — TSH: TSH: 1.753 u[IU]/mL (ref 0.350–4.500)

## 2024-02-07 LAB — BETA-HYDROXYBUTYRIC ACID: Beta-Hydroxybutyric Acid: 0.88 mmol/L — ABNORMAL HIGH (ref 0.05–0.27)

## 2024-02-07 LAB — T4, FREE: Free T4: 0.85 ng/dL (ref 0.61–1.12)

## 2024-02-07 LAB — PHOSPHORUS: Phosphorus: 2.9 mg/dL (ref 2.5–4.6)

## 2024-02-07 MED ORDER — CHLORHEXIDINE GLUCONATE CLOTH 2 % EX PADS
6.0000 | MEDICATED_PAD | Freq: Every day | CUTANEOUS | Status: DC
  Administered 2024-02-07 – 2024-02-13 (×7): 6 via TOPICAL

## 2024-02-07 MED ORDER — INSULIN GLARGINE-YFGN 100 UNIT/ML ~~LOC~~ SOLN: 20.0000 [IU] | SUBCUTANEOUS | Status: DC

## 2024-02-07 MED ORDER — INSULIN ASPART 100 UNIT/ML IJ SOLN
4.0000 [IU] | Freq: Three times a day (TID) | INTRAMUSCULAR | Status: DC
  Administered 2024-02-07: 4 [IU] via SUBCUTANEOUS

## 2024-02-07 MED ORDER — POTASSIUM CHLORIDE 10 MEQ/100ML IV SOLN
10.0000 meq | INTRAVENOUS | Status: DC
  Administered 2024-02-07 (×4): 10 meq via INTRAVENOUS
  Filled 2024-02-07 (×3): qty 100

## 2024-02-07 MED ORDER — LACTATED RINGERS IV SOLN: INTRAVENOUS | Status: DC

## 2024-02-07 MED ORDER — INSULIN ASPART 100 UNIT/ML IJ SOLN
6.0000 [IU] | Freq: Three times a day (TID) | INTRAMUSCULAR | Status: DC
  Administered 2024-02-07 – 2024-02-13 (×13): 6 [IU] via SUBCUTANEOUS

## 2024-02-07 MED ORDER — INSULIN ASPART 100 UNIT/ML IJ SOLN
0.0000 [IU] | Freq: Three times a day (TID) | INTRAMUSCULAR | Status: DC
  Administered 2024-02-07: 11 [IU] via SUBCUTANEOUS
  Administered 2024-02-07: 15 [IU] via SUBCUTANEOUS
  Administered 2024-02-08: 11 [IU] via SUBCUTANEOUS
  Administered 2024-02-08: 5 [IU] via SUBCUTANEOUS
  Administered 2024-02-08 – 2024-02-09 (×2): 3 [IU] via SUBCUTANEOUS

## 2024-02-07 MED ORDER — POTASSIUM CHLORIDE CRYS ER 20 MEQ PO TBCR
40.0000 meq | EXTENDED_RELEASE_TABLET | Freq: Once | ORAL | Status: AC
  Administered 2024-02-07: 40 meq via ORAL
  Filled 2024-02-07: qty 2

## 2024-02-07 MED ORDER — INSULIN ASPART 100 UNIT/ML IJ SOLN
0.0000 [IU] | Freq: Every day | INTRAMUSCULAR | Status: DC
  Administered 2024-02-07: 4 [IU] via SUBCUTANEOUS

## 2024-02-07 MED ORDER — INSULIN GLARGINE 100 UNIT/ML ~~LOC~~ SOLN
20.0000 [IU] | Freq: Every day | SUBCUTANEOUS | Status: DC
  Administered 2024-02-07: 20 [IU] via SUBCUTANEOUS
  Filled 2024-02-07 (×2): qty 0.2

## 2024-02-07 NOTE — Progress Notes (Signed)
 Patient was informed in Spanish that he needed to be scanned for retention and that there was an order for the placement of a foley cath. Patient was bladder scanned with showing on scan. Patient then requested to use urinal and was able to void only . He indicated that he can void and does not want catheter. RN will reassess for discomfort. Night floor coverage notified via Amion.

## 2024-02-07 NOTE — Progress Notes (Signed)
 Echocardiogram 2D Echocardiogram has been performed.  Kevin [name] 02/07/2024, 3:12 PM

## 2024-02-07 NOTE — Evaluation (Signed)
 Occupational Therapy Evaluation Patient Details Name: Kevin [name] MRN: [id number] DOB: Feb 10, 1951 Today's Date: 02/07/2024   History of Present Illness   Pt is a 73 y.o. male presenting 02/06/24 s/p unwitnessed fall 2-3 days ago. Admitted with Type 2 diabetes mellitus with hyperosmolar hyperglycemic state and BP of 203/95. PMH: insulin-dependent DM type II, history of gastrointestinal internal stromal tumor, thyroid nodule, HTN, chronic muscle spasm, chronic hypokalemia, chronic iron deficiency anemia, GERD, BPH, protein calorie malnutrition, and history of noncompliance with insulin regimen and multiple episodes of hypoglycemia     Clinical Impressions At baseline, per pt report and chart review, pt is largely Independent to Mod I with ADLs but requires assistance for bathing and donning/doffing socks, and is largely Independent to Mod I with functional mobility. At baseline, pt receives PRN assistance from wife and daughter for ADLs and IADLs, including transportation. Pt now presents with decreased activity tolerance, generalized B UE weakness, pain with R shoulder AROM internal and external rotation, impaired cognition, decreased balance, and decreased safety and independence with functional tasks. Pt currently demonstrates ability to complete UB ADLs with Set up to Contact guard assist, LB ADLs with Min to Mod assist, and functional mobility/transfers with a RW with Contact guard to Min assist. Pt requiring cues for safety, compensatory strategies, and sequencing throughout session. Pt's VSS on RA throughout session. Pt will benefit from acute skilled OT services to address deficits outlined below and to increase safety and independence with functional tasks. Post acute discharge, pt will benefit from intensive inpatient skilled rehab services < 3 hours per day to maximize rehab potential.      If plan is discharge home, recommend the following:   A little help with walking and/or transfers;A  little help with bathing/dressing/bathroom;Assistance with cooking/housework;Direct supervision/assist for medications management;Direct supervision/assist for financial management;Assist for transportation;Help with stairs or ramp for entrance     Functional Status Assessment   Patient has had a recent decline in their functional status and demonstrates the ability to make significant improvements in function in a reasonable and predictable amount of time.     Equipment Recommendations   BSC/3in1;Tub/shower bench     Recommendations for Other Services         Precautions/Restrictions   Precautions Precautions: Fall Restrictions Weight Bearing Restrictions Per Provider Order: No     Mobility Bed Mobility Overal bed mobility: Needs Assistance Bed Mobility: Supine to Sit     Supine to sit: Contact guard, Used rails, HOB elevated     General bed mobility comments: Pt performs bed mobility with increased time and CGA for safety.    Transfers Overall transfer level: Needs assistance Equipment used: Rolling walker (2 wheels) Transfers: Sit to/from Stand, Bed to chair/wheelchair/BSC Sit to Stand: Contact guard assist, Min assist     Step pivot transfers: Contact guard assist, Min assist     General transfer comment: cues for hand placement, sequencing, and safety      Balance Overall balance assessment: Needs assistance Sitting-balance support: Single extremity supported, No upper extremity supported, Feet supported   Sitting balance - Comments: sitting EOB and in recliner Postural control: Other (comment) (prefers to sit with anterior lean) Standing balance support: Reliant on assistive device for balance, During functional activity, Bilateral upper extremity supported Standing balance-Leahy Scale: Poor Standing balance comment: Pt reliant on RW for standing balance. Maintains increased trunk flexion unless cued  ADL  either performed or assessed with clinical judgement   ADL Overall ADL's : Needs assistance/impaired Eating/Feeding: Set up;Sitting   Grooming: Set up;Supervision/safety;Cueing for sequencing;Sitting   Upper Body Bathing: Contact guard assist;Sitting;Cueing for sequencing;Cueing for compensatory techniques   Lower Body Bathing: Minimal assistance;Moderate assistance;Cueing for safety;Cueing for sequencing;Cueing for compensatory techniques;Sitting/lateral leans;Sit to/from stand   Upper Body Dressing : Contact guard assist;Cueing for sequencing;Sitting   Lower Body Dressing: Minimal assistance;Sit to/from stand;Sitting/lateral leans;Cueing for sequencing;Cueing for compensatory techniques   Toilet Transfer: Contact guard assist;Minimal assistance;Ambulation;BSC/3in1;Rolling walker (2 wheels);Cueing for safety;Cueing for sequencing Toilet Transfer Details (indicate cue type and reason): Min assist to power up then CGA once in standing; simulated bed to chair Toileting- Clothing Manipulation and Hygiene: Moderate assistance;Cueing for sequencing;Cueing for compensatory techniques;Sit to/from stand Toileting - Clothing Manipulation Details (indicate cue type and reason): for pericare; simulated at EOB; pt currently with foley cath     Functional mobility during ADLs: Contact guard assist;Minimal assistance;Rolling walker (2 wheels);Cueing for safety;Cueing for sequencing General ADL Comments: Pt with decreased activity tolerance and pain in R shoulder affecting functional level.     Vision Baseline Vision/History: 1 Wears glasses Ability to See in Adequate Light: 0 Adequate Patient Visual Report: No change from baseline       Perception         Praxis         Pertinent Vitals/Pain Pain Assessment Pain Assessment: Faces Faces Pain Scale: Hurts even more Pain Location: abdomen; R shoulder with AROM external and internal rotation Pain Descriptors / Indicators: Grimacing,  Guarding, Discomfort, Cramping Pain Intervention(s): Limited activity within patient's tolerance, Monitored during session, Repositioned     Extremity/Trunk Assessment Upper Extremity Assessment Upper Extremity Assessment: Right hand dominant;Generalized weakness;RUE deficits/detail;LUE deficits/detail RUE Deficits / Details: generalized weakness; pain in shoulder with external and internal rotation; AROM and fine and gross motor coordination all WFL; pt reports occasional spasms and tremors in R UE, but none present this session RUE Sensation: WNL RUE Coordination: WNL LUE Deficits / Details: generalized weakness LUE Sensation: WNL LUE Coordination: WNL   Lower Extremity Assessment Lower Extremity Assessment: Defer to PT evaluation   Cervical / Trunk Assessment Cervical / Trunk Assessment: Kyphotic   Communication Communication Communication: Impaired Factors Affecting Communication: Other (comment) (Non-English speaking. Video interpreter present and interpreting throughout session.)   Cognition Arousal: Alert Behavior During Therapy: WFL for tasks assessed/performed Cognition: Cognition impaired, No family/caregiver present to determine baseline     Awareness: Intellectual awareness intact, Online awareness impaired Memory impairment (select all impairments): Declarative long-term memory, Working memory Attention impairment (select first level of impairment): Alternating attention Executive functioning impairment (select all impairments): Reasoning, Problem solving, Sequencing, Organization OT - Cognition Comments: Pt AAOx4 and pleasant throughout session. Pt gave conflicting information regarding home set up and level of assistance required at baseline. Pt with difficulty following 2 and 3-step instructions. Pt occasionally impulsive with movement during session.                 Following commands: Impaired Following commands impaired: Only follows one step commands  consistently, Follows multi-step commands inconsistently, Follows multi-step commands with increased time     Cueing  General Comments   Cueing Techniques: Verbal cues;Tactile cues;Visual cues   VSS on RA throughout session. Video interpreter Maureen Ralphs [phone]) present and interpreting throughout session.    Exercises     Shoulder Instructions      Home Living Family/patient expects to be discharged to:: Other (Comment) [facility])  Additional Comments: Pt lives with his wife in a hotel. Pt reports hotel room has a tub/shower combo. Pt uncertain which floor the hotel room is on. Per chart review, pt's wife earlier this day reported the room is on the second floor with two flights or stairs to get up to room. Per chart review, pt's wife reports pt does not have any DME or any AD at this time. However, pt reports he has a SPC and RW. No family available at this time to confirm.      Prior Functioning/Environment Prior Level of Function : Needs assist             Mobility Comments: Pt reports use of SPC just prior to this hospitalization. However, per chart review, pt's wife reported pt was ambulating with a SPC after his cancer surgery but hasn't used it in over a year. Pt with unwitnessed fall leading to this admission. Pt reports he fell getting out of the shower. OT uncertain if this is acurate or not. ADLs Comments: Pt reports assistance with showers and with doffing/donning socks. However, per chart review, pt's wife reported earlier this day that pt is Independent to Mod I with dressing and toileting. Per chart review, wife reports pt showering with assist approx. once every three weeks due to difficulty washing self. However, pt reports showering regularly with no issues until fall leading to this admission, which led to R shoulder pain. No family available to confirm information at this time.    OT Problem List: Decreased strength;Decreased  activity tolerance;Impaired vision/perception;Decreased cognition;Decreased knowledge of use of DME or AE;Pain   OT Treatment/Interventions: Self-care/ADL training;Therapeutic exercise;DME and/or AE instruction;Therapeutic activities;Cognitive remediation/compensation;Patient/family education;Balance training      OT Goals(Current goals can be found in the care plan section)   Acute Rehab OT Goals Patient Stated Goal: to have less pain in shoulder and return home OT Goal Formulation: With patient Time For Goal Achievement: 02/21/24 Potential to Achieve Goals: Good ADL Goals Pt Will Perform Grooming: with supervision;standing Pt Will Perform Lower Body Bathing: with supervision;sitting/lateral leans;sit to/from stand Pt Will Perform Lower Body Dressing: with supervision;sitting/lateral leans;sit to/from stand Pt Will Transfer to Toilet: with supervision;ambulating;regular height toilet;grab bars (with least restrictive AD) Pt Will Perform Toileting - Clothing Manipulation and hygiene: with supervision;sitting/lateral leans;sit to/from stand Pt Will Perform Tub/Shower Transfer: with supervision;tub bench (with least restrictive AD)   OT Frequency:  Min 1X/week    Co-evaluation              AM-PAC OT "6 Clicks" Daily Activity     Outcome Measure Help from another person eating meals?: A Little Help from another person taking care of personal grooming?: A Little Help from another person toileting, which includes using toliet, bedpan, or urinal?: A Lot Help from another person bathing (including washing, rinsing, drying)?: A Lot Help from another person to put on and taking off regular upper body clothing?: A Little Help from another person to put on and taking off regular lower body clothing?: A Little 6 Click Score: 16   End of Session Equipment Utilized During Treatment: Gait belt;Rolling walker (2 wheels) Nurse Communication: Mobility status  Activity Tolerance: Patient  tolerated treatment well Patient left: in chair;with call bell/phone within reach;with chair alarm set  OT Visit Diagnosis: Unsteadiness on feet (R26.81);Other abnormalities of gait and mobility (R26.89);Muscle weakness (generalized) (M62.81);Pain;History of falling (Z91.81);Other (comment) (decreased activity tolerance)  Time: 1027-2536 OT Time Calculation (min): 28 min Charges:  OT General Charges $OT Visit: 1 Visit OT Evaluation $OT Eval Low Complexity: 1 Low OT Treatments $Self Care/Home Management : 8-22 mins  Chyla Schlender "Orson Eva., OTR/L, MA Acute Rehab [phone]   Lendon Colonel 02/07/2024, 6:09 PM

## 2024-02-07 NOTE — Progress Notes (Signed)
 Patient was able to use the urinal again with out. I explained urinary retention and the order for the catheter in Spanish and he still declines the placement of the foley. Charge nurse made aware of initial refusal.

## 2024-02-07 NOTE — Progress Notes (Signed)
 Notified provider of CBG of 444. Patient has received 20 units of long acting insulin and will receive shorting acting sliding scale and meal coverage. Provider okay with giving this insulin and rechecking CBG later, no need for any more labs at this time.

## 2024-02-07 NOTE — Plan of Care (Signed)

## 2024-02-07 NOTE — Evaluation (Signed)
 Physical Therapy Evaluation Patient Details Name: Kevin [name] MRN: [id number] DOB: Jan 25, 1951 Today's Date: 02/07/2024  History of Present Illness  Pt is a 73 y.o. male presenting 02/06/24 s/p unwitnessed fall 2-3 days ago. Admitted with Type 2 diabetes mellitus with hyperosmolar hyperglycemic state. PMH insulin-dependent DM type II, history of gastrointestinal internal stromal tumor, HTN, chronic muscle spasm, chronic hypokalemia, chronic iron deficiency anemia, GERD, protein calorie malnutrition and history of noncompliance with insulin regimen and multiple episodes of hypoglycemia  Clinical Impression  Pt is a 73 y.o. male presenting on 02/06/24 with above conditions and deficits below, see PT Problem List. PTA pt lived in a motel on the second floor with his wife where he hasn't used an AD in the past year. Pt received assistance for ADLs from his wife and daughter. Currently pt is CGA for bed mobility, CGA-min A for transfers, and min A for ambulation. Pt shows deficits in activity tolerance, functional mobility, ambulation, safety/judgement awareness, managing his medications, cognition, and static/dynamic balance and would benefit from continued acute PT before d/c. Pt's family has requested he receives care in a long-term care facility for similar reasons mentioned above. Given pt's current condition, PLOF, limited support at d/c, and ability to care for himself, pt would benefit from intense rehab < 3 hours upon d/c while the family explores options for long term care. Will continue to follow acutely.          If plan is discharge home, recommend the following: A little help with walking and/or transfers;A little help with bathing/dressing/bathroom;Assistance with cooking/housework;Direct supervision/assist for medications management;Assist for transportation;Help with stairs or ramp for entrance   Can travel by private vehicle   Yes    Equipment Recommendations Rolling walker (2 wheels)   Recommendations for Other Services       Functional Status Assessment Patient has had a recent decline in their functional status and demonstrates the ability to make significant improvements in function in a reasonable and predictable amount of time.     Precautions / Restrictions Precautions Precautions: Fall Restrictions Weight Bearing Restrictions Per Provider Order: No      Mobility  Bed Mobility Overal bed mobility: Needs Assistance Bed Mobility: Supine to Sit     Supine to sit: Contact guard, Used rails, HOB elevated     General bed mobility comments: Pt performs bed mobility with increased time and CGA for safety. Uses rails and HOB elevated >60 degs    Transfers Overall transfer level: Needs assistance Equipment used: Rolling walker (2 wheels) Transfers: Sit to/from Stand Sit to Stand: Contact guard assist, Min assist           General transfer comment: 2 STS performed, 1st from EOB requiring min A for power up and hand placement cueing. 2nd from toilet seat requiring CGA for safety. Pt performs both with increased trunk flexion, needs extra time, and cueing for hand placement    Ambulation/Gait Ambulation/Gait assistance: Min assist Gait Distance (Feet): 22 Feet Assistive device: Rolling walker (2 wheels) Gait Pattern/deviations: Step-through pattern, Decreased stride length, Shuffle, Trunk flexed, Drifts right/left, Narrow base of support Gait velocity: reduced Gait velocity interpretation: 1.31 - 2.62 ft/sec, indicative of limited community ambulator   General Gait Details: Pt requires min A for RW management, cueing for maintaining a closer proximity to RW, and maintaining upright trunk posture. Pt walks with slow, short, and shuffling steps.  Stairs            Psychologist, prison and probation services  Tilt Bed    Modified Rankin (Stroke Patients Only)       Balance Overall balance assessment: Needs assistance Sitting-balance support: Single extremity  supported, Feet supported, No upper extremity supported Sitting balance-Leahy Scale: Good Sitting balance - Comments: pt sits EOB without LOB but prefers to sit with anterior lean Postural control: Other (comment) (anterior lean) Standing balance support: Reliant on assistive device for balance, During functional activity, Bilateral upper extremity supported Standing balance-Leahy Scale: Poor Standing balance comment: Pt reliant on RW for standing balance. Maintains increased trunk flexion unless cued                             Pertinent Vitals/Pain Pain Assessment Pain Assessment: Faces Faces Pain Scale: Hurts even more Pain Location: abdomen Pain Descriptors / Indicators: Grimacing, Guarding, Cramping Pain Intervention(s): Monitored during session    Home Living Family/patient expects to be discharged to:: Other (Comment) (motel)                   Additional Comments: Pt lives with wife in a motel, and will have PRN assistance from his wife & daughter upon d/c. Pt's room is on the 2nd floor with 2 flights, 1st 5 steps 2nd 6 steps with a rails on R. Pt's family reports he has had difficulty navigating these stairs recently d/t fatigeu. The family has no medical equipment in the home. Hasn't used an AD in over a year (was using [facility])    Prior Function Prior Level of Function : Needs assist             Mobility Comments: Pt was ambulating with a SPC after his cancer surgery but hasn't used it in over a year. . ADLs Comments: His daughter drives him around when needed. The pt doesn't work. He is able to dress him but per family only showers once every three or so weeks s/t the challenge of washing himself in the shower.     Extremity/Trunk Assessment   Upper Extremity Assessment Upper Extremity Assessment: Defer to OT evaluation    Lower Extremity Assessment Lower Extremity Assessment: Generalized weakness;RLE deficits/detail;LLE deficits/detail RLE  Sensation: history of peripheral neuropathy LLE Sensation: history of peripheral neuropathy    Cervical / Trunk Assessment Cervical / Trunk Assessment: Kyphotic  Communication   Communication Communication: Impaired Factors Affecting Communication: Other (comment) (Pt is non [place] speaking. Used video interpreter.)    Cognition Arousal: Alert Behavior During Therapy: WFL for tasks assessed/performed   PT - Cognitive impairments: Safety/Judgement, Problem solving                         Following commands: Intact       Cueing Cueing Techniques: Verbal cues, Visual cues     General Comments General comments (skin integrity, edema, etc.): VSS throughout session on RA    Exercises     Assessment/Plan    PT Assessment Patient needs continued PT services  PT Problem List Decreased strength;Decreased range of motion;Decreased activity tolerance;Decreased balance;Decreased mobility;Decreased coordination;Decreased knowledge of use of DME;Decreased safety awareness;Decreased knowledge of precautions;Cardiopulmonary status limiting activity;Pain;Impaired sensation       PT Treatment Interventions DME instruction;Gait training;Stair training;Functional mobility training;Therapeutic activities;Therapeutic exercise;Balance training;Neuromuscular re-education;Patient/family education    PT Goals (Current goals can be found in the Care Plan section)  Acute Rehab PT Goals Patient Stated Goal: improve mobility PT Goal Formulation: With patient/family Time For Goal Achievement: 02/21/24  Potential to Achieve Goals: Good    Frequency Min 1X/week     Co-evaluation               AM-PAC PT "6 Clicks" Mobility  Outcome Measure Help needed turning from your back to your side while in a flat bed without using bedrails?: A Little Help needed moving from lying on your back to sitting on the side of a flat bed without using bedrails?: A Lot Help needed moving to and from  a bed to a chair (including a wheelchair)?: A Little Help needed standing up from a chair using your arms (e.g., wheelchair or bedside chair)?: A Little Help needed to walk in hospital room?: A Little Help needed climbing 3-5 steps with a railing? : Total 6 Click Score: 15    End of Session Equipment Utilized During Treatment: Gait belt Activity Tolerance: Patient limited by fatigue;Patient limited by pain Patient left: in chair;with call bell/phone within reach;with chair alarm set Nurse Communication: Mobility status PT Visit Diagnosis: Unsteadiness on feet (R26.81);Other abnormalities of gait and mobility (R26.89);Muscle weakness (generalized) (M62.81);History of falling (Z91.81);Difficulty in walking, not elsewhere classified (R26.2);Pain Pain - part of body:  (abdomen)    Time: 2130-8657 PT Time Calculation (min) (ACUTE ONLY): 37 min   Charges:   PT Evaluation $PT Eval Moderate Complexity: 1 Mod PT Treatments $Therapeutic Activity: 8-22 mins PT General Charges $$ ACUTE PT VISIT: 1 Visit        [address], SPT   [place] 02/07/2024, 1:25 PM

## 2024-02-07 NOTE — Plan of Care (Signed)
   Problem: Education: Goal: Knowledge of General Education information will improve Description: Including pain rating scale, medication(s)/side effects and non-pharmacologic comfort measures Outcome: Progressing   Problem: Health Behavior/Discharge Planning: Goal: Ability to manage health-related needs will improve Outcome: Progressing   Problem: Clinical Measurements: Goal: Will remain free from infection Outcome: Progressing

## 2024-02-07 NOTE — Progress Notes (Signed)
 Date and time results received: 02/07/24 0500 (use smartphrase ".now" to insert current time)  Test: potassium Critical Value: 2.8  Name of Provider Notified: Dr. Lazarus Salines  Orders Received? Or Actions Taken?: Orders Received - See Orders for details

## 2024-02-07 NOTE — Progress Notes (Signed)
 PROGRESS NOTE    Kevin [name]  VQQ:[id number] DOB: 02-Nov-1951 DOA: 02/06/2024 PCP: Marcine Matar, MD    Brief Narrative:  73 year old with history of insulin-dependent type 2 diabetes, history of gastrointestinal internal stromal tumor, essential hypertension, chronic hypokalemia, chronic current urgency, GERD and history of noncompliance presents to the ER with unwitnessed fall 2 days ago, voiding on himself, right arm pain.  In the emergency room blood pressure 203/95.  On room air.  Blood glucose more than 600, osmolality 326.  Beta hydroxybutyrate 2.99.  Sodium 122 and blood glucose 900 on serology.  Creatinine 2.02.  He was dribbling urine in the ER.  Declined to have catheter placed.  Skeletal survey negative for acute fractures.  Patient apparently was not taking any medications unknown how long.  Subjective: Patient seen and examined.  He had a palpable urinary bladder and declined to have Foley catheter overnight.  Foley catheter was placed by the nursing staff, more than 1 L drained and patient felt some spasm but felt overall better. Patient tells me that he is not sure why he did not take any medications.  Poor historian overall.   Called and discussed Daughter Leavy Cella , She is not sure why he is not taking his medicine.   Assessment & Plan:   Hyperosmolar hyperglycemic state Hyponatremia secondary to hyperglycemia History of insulin-dependent DM type II with insulin noncompliance. History of recurrent hyperglycemic crisis in the past > Patient presented to emergency department complaining of fall 2 days ago at home and since then he is having right-sided shoulder spasm.  Patient is not taking neither insulin, home blood pressure regimen in fact not taking any home medications at all. -Previous A1c above 15 last checked in April 2024. --Presented with blood sugars more than 600.  Treated with IV insulin infusion overnight.  Anion gap closed.  Electrolytes corrected. --  Start patient on 20 units of long-acting insulin, portance of prandial insulin and sliding scale insulin.  Will check hemoglobin A1c. Need to investigate with family about access to medications.     Hypertensive emergency History essential hypertension-medication noncompliance -On presentation to ED elevated blood pressure 203/95.  After giving labetalol blood pressure has been improved 154/98. - Starting IV hydralazine 10 mg every 6 hour as needed for systolic blood pressure above 433 or diastolic blood pressure above 295.  Goal to decrease blood pressure 25% in next 24 hours. -Of note patient is not taking lisinopril at home. -EKG showing sinus rhythm heart rate 63, left ventricular hypertrophy pattern. -Patient denies any chest pain, chest pressure and shortness of breath. -Starting oral amlodipine. -Continue hydralazine as needed. --Aggressively replace magnesium and potassium.  Recheck levels.   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 -See abdomen pelvis showed prominent bladder distention with bladder ultralong hydronephrosis and hydroureter and enlarged prostate associated with bladder outlet obstruction. -Creatinine 2.08.  Baseline creatinine within normal range GFR above 60. -UA showing increased blood glucose over 500, dipstick hemoglobin positive, ketone 20, normal RBC, normal WBC and no bacteria. - Acute kidney injury secondary to postrenal AKI BPH from bladder outlet obstruction and prerenal acute kidney injury in the setting of HHS. - Foley catheter inserted.  More than 1 L drained.  Will go home with catheter.  Outpatient urology follow-up. - Starting oral Flomax.      Fall at home -Patient presenting with  complaining of fall at home 2 to 3 days ago.  Denies any hitting of the head and loss of consciousness.   Extensive imaging has been done which ruled out any acute fracture. - X-ray right sided shoulder degenerative change no fracture. -  CT head no acute intracranial abnormality. - CT cervical spine no fracture or dislocation. - CT chest, abdomen and pelvis no acute abnormal finding. -Continue fall precaution - Work with PT OT.   Thyroid nodule -CT cervical spine showed incidental finding of thyroid nodule. -Radiology recommended nonemergent thyroid ultrasound which can be done outpatient setting. - TSH pending.     Anemia of chronic disease -CBC showing stable H&H 12.9 and 37.  Continue to monitor   GERD -Not currently taking any Protonix.   History of gastrointestinal stromal tumor -Last clinic visit in oncology clinic in April 2024. -Per chart review oncology note patient was noncompliant with Gleevec which has been resumed on 03/17/2023.  However patient is not taking it currently.  Also patient has been lost to follow-up with the oncology clinic afterward. -On discharge need to refer to oncology clinic to reestablish care.   Transaminitis Hyperbilirubinemia -CMP showing transaminitis and elevated bilirubin 1.3.  Hyperbilirubinemia in the setting of dehydration from DKA percent blasts HHS. -However CT abdomen showed Suggestion of developing hypodense nodule in the liver measuring 1.8 cm diameter. Consider elective MRI follow-up further. -Per chart review patient has previous hepatic abscess which has been drained and completed course of antibiotic in the past. -Will order elective MRI.  DVT prophylaxis: heparin injection 5,000 Units Start: 02/06/24 2200 SCDs Start: 02/06/24 2155 Place TED hose Start: 02/06/24 2155   Code Status: Full code Family Communication: Daughter on the phone Disposition Plan: Status is: Inpatient Remains inpatient appropriate because: Insulin infusion     Consultants:  None  Procedures:  None  Antimicrobials:   None     Objective: Vitals:   02/07/24 0742 02/07/24 0900 02/07/24 1009 02/07/24 1100  BP: 99/63 118/75 (!) 142/83 (!) 86/54  Pulse: 65 78    Resp: 16     Temp:    98.4 F (36.9 C)  TempSrc:    Oral  SpO2: 98% 97%    Weight:      Height:        Intake/Output Summary (Last 24 hours) at 02/07/2024 1207 Last data filed at 02/07/2024 1100 Gross per 24 hour  Intake 174.87 ml  Output 1730 ml  Net -1555.13 ml   Filed Weights   02/06/24 1723 02/06/24 2251  Weight: 59.8 kg 53.1 kg    Examination:  General exam: Appears calm and comfortable  Frail debilitated.  Comfortable and interactive. Respiratory system: Clear to auscultation. Respiratory effort normal. Cardiovascular system: S1 & S2 heard, RRR.  Gastrointestinal system: Abdomen is nondistended, soft and nontender. No organomegaly or masses felt. Normal bowel sounds heard. Palpable urinary bladder before catheter was placed. Central nervous system: Alert and oriented. No focal neurological deficits. Extremities: Symmetric 5 x 5 power.    Data Reviewed: I have personally reviewed following labs and imaging studies  CBC: Recent Labs  Lab 02/06/24 1729  WBC 5.4  HGB 12.9*  HCT 37.8*  MCV 93.6  PLT 256   Basic Metabolic Panel: Recent Labs  Lab 02/06/24 1729 02/07/24 0033 02/07/24 0330 02/07/24 0959  NA 122* 126* 129* 130*  K 4.3 3.1* 2.8* 4.0  CL 82* 87* 89* 93*  CO2 21* 24 29 21*  GLUCOSE 878* 734* 469* 390*  BUN  34* 43* 42* 40*  CREATININE 2.08* 1.96* 1.87* 1.71*  CALCIUM 9.1 8.9 8.8* 9.2  MG  --   --   --  1.7  PHOS  --   --   --  2.9   GFR: Estimated Creatinine Clearance: 29.3 mL/min (A) (by C-G formula based on SCr of 1.71 mg/dL (H)). Liver Function Tests: Recent Labs  Lab 02/06/24 1729  AST 23  ALT 20  ALKPHOS 153*  BILITOT 1.3*  PROT 7.8  ALBUMIN 3.3*   Recent Labs  Lab 02/06/24 1729  LIPASE 24   No results for input(s): "AMMONIA" in the last 168 hours. Coagulation Profile: No  results for input(s): "INR", "PROTIME" in the last 168 hours. Cardiac Enzymes: No results for input(s): "CKTOTAL", "CKMB", "CKMBINDEX", "TROPONINI" in the last 168 hours. BNP (last 3 results) No results for input(s): "PROBNP" in the last 8760 hours. HbA1C: No results for input(s): "HGBA1C" in the last 72 hours. CBG: Recent Labs  Lab 02/07/24 0101 02/07/24 0311 02/07/24 0511 02/07/24 0712 02/07/24 1127  GLUCAP >600* 490* 330* 340* 444*   Lipid Profile: No results for input(s): "CHOL", "HDL", "LDLCALC", "TRIG", "CHOLHDL", "LDLDIRECT" in the last 72 hours. Thyroid Function Tests: Recent Labs    02/06/24 2011  TSH 1.753  FREET4 0.85   Anemia Panel: No results for input(s): "VITAMINB12", "FOLATE", "FERRITIN", "TIBC", "IRON", "RETICCTPCT" in the last 72 hours. Sepsis Labs: No results for input(s): "PROCALCITON", "LATICACIDVEN" in the last 168 hours.  No results found for this or any previous visit (from the past 240 hours).       Radiology Studies: CT CHEST ABDOMEN PELVIS WO CONTRAST Result Date: 02/06/2024 CLINICAL DATA:  Poly trauma, blunt.  Trauma due to a fall. EXAM: CT CHEST, ABDOMEN AND PELVIS WITHOUT CONTRAST TECHNIQUE: Multidetector CT imaging of the chest, abdomen and pelvis was performed following the standard protocol without IV contrast. RADIATION DOSE REDUCTION: This exam was performed according to the departmental dose-optimization program which includes automated exposure control, adjustment of the mA and/or kV according to patient size and/or use of iterative reconstruction technique. COMPARISON:  Chest radiograph 06/05/2022. CT abdomen and pelvis 06/05/2022. FINDINGS: CT CHEST FINDINGS Cardiovascular: Normal heart size. No pericardial effusions. Normal caliber thoracic aorta. Minimal calcification in the aorta. Coronary artery calcifications. Mediastinum/Nodes: No mediastinal gas or hematoma. Esophagus is decompressed. No significant lymphadenopathy. Multiple thyroid  nodules, largest on the right measuring 3.1 cm diameter. Lungs/Pleura: Lungs are clear. No pleural effusion or pneumothorax. Few scattered subcentimeter calcified granulomas are present. No significant pulmonary nodules. No imaging follow-up is indicated. Musculoskeletal: Degenerative changes in the spine. No acute bony abnormalities. CT ABDOMEN PELVIS FINDINGS Hepatobiliary: Poorly defined low-attenuation lesion in the dome of the liver measuring 1.8 cm diameter. This is new since previous study. Consider follow-up with elective MRI to exclude a developing liver mass. Gallbladder and bile ducts are unremarkable. Pancreas: Unremarkable. No pancreatic ductal dilatation or surrounding inflammatory changes. Spleen: Spleen is surgically absent. Adrenals/Urinary Tract: No adrenal gland nodules. Left renal cysts are unchanged since prior study. No imaging follow-up is indicated. Bilateral hydronephrosis and hydroureter. No obstructing stone or mass is identified. The bladder is diffusely fluid distended. Changes likely to represent reflux or stasis, possibly associated with bladder outlet obstruction. No bladder wall thickening or filling defect. Stomach/Bowel: Postoperative changes in the stomach. Stomach, small bowel, and colon are not abnormally distended. No wall thickening or inflammatory changes. Appendix is normal. Vascular/Lymphatic: No significant vascular findings are present. No enlarged abdominal or pelvic lymph nodes.  Reproductive: Prostate gland is enlarged, measuring 6.4 cm diameter. Other: No free air or free fluid in the abdomen. Abdominal wall musculature appears intact. No mesenteric hematomas. Musculoskeletal: Degenerative changes in the lumbar spine. No acute bony abnormalities. IMPRESSION: 1. No acute posttraumatic changes demonstrated in the chest, abdomen, or pelvis. 2. Multiple thyroid nodules, most prominent a 3.1 cm calcified nodule in the right. Recommend follow-up with elective thyroid US  (Ref: J Am Coll Radiol. 2015 Feb;12(2): 143-50). 3. Prominent bladder distention with bilateral hydronephrosis and hydroureter likely representing stasis or reflux, likely associated with bladder outlet obstruction. Prostate gland is diffusely enlarged. 4. Suggestion of developing hypodense nodule in the liver measuring 1.8 cm diameter. Consider elective MRI follow-up further characterization. Electronically Signed   By: Burman Nieves M.D.   On: 02/06/2024 20:54   CT Cervical Spine Wo Contrast Result Date: 02/06/2024 CLINICAL DATA:  Fall 2-3 days ago.  Patient has been down since. EXAM: CT CERVICAL SPINE WITHOUT CONTRAST TECHNIQUE: Multidetector CT imaging of the cervical spine was performed without intravenous contrast. Multiplanar CT image reconstructions were also generated. RADIATION DOSE REDUCTION: This exam was performed according to the departmental dose-optimization program which includes automated exposure control, adjustment of the mA and/or kV according to patient size and/or use of iterative reconstruction technique. COMPARISON:  None Available. FINDINGS: Alignment: No significant listhesis is present. Normal cervical lordosis is present. Skull base and vertebrae: The vertebral body heights are normal. No acute or healing fractures are present. Soft tissues and spinal canal: A calcified nodule the right lobe of the thyroid measures 3.4 x 3.0 x 4.3 cm. Additional nodules are present in the isthmus and inferior left lobe. This displaces the airway slightly to the left. No significant adenopathy is present. Disc levels: Multilevel right-sided facet and uncovertebral spurring leads to moderate right foraminal stenosis at C4-5, C5-6 and C6-7. Upper chest: The lung apices are clear. The thoracic inlet is within normal limits. IMPRESSION: 1. No acute or healing fractures of the cervical spine. 2. Multilevel right-sided facet and uncovertebral spurring leads to moderate right foraminal stenosis at C4-5,  C5-6 and C6-7. 3. 3.4 x 3.0 x 4.3 cm calcified nodule in the right lobe of the thyroid. Additional nodules are present in the isthmus and inferior left lobe. Recommend non-emergent thyroid ultrasound. Reference: J Am Coll Radiol. 2015 Feb;12(2): 143-50 Electronically Signed   By: Marin Roberts M.D.   On: 02/06/2024 20:43   CT Head Wo Contrast Result Date: 02/06/2024 CLINICAL DATA:  Head trauma. Patient fell 2-3 days ago and has been down since. EXAM: CT HEAD WITHOUT CONTRAST TECHNIQUE: Contiguous axial images were obtained from the base of the skull through the vertex without intravenous contrast. RADIATION DOSE REDUCTION: This exam was performed according to the departmental dose-optimization program which includes automated exposure control, adjustment of the mA and/or kV according to patient size and/or use of iterative reconstruction technique. COMPARISON:  None Available. FINDINGS: Brain: Mild atrophy and white matter changes are within normal limits for age. No acute infarct, hemorrhage, or mass lesion is present. Deep brain nuclei are within normal limits. The ventricles are of normal size. No significant extraaxial fluid collection is present. The brainstem and cerebellum are within normal limits. Midline structures are within normal limits. Vascular: Atherosclerotic calcifications are present within the cavernous internal carotid arteries bilaterally. No hyperdense vessel is present. Skull: Calvarium is intact. No focal lytic or blastic lesions are present. No significant extracranial soft tissue lesion is present. Sinuses/Orbits: The paranasal sinuses and mastoid  air cells are clear. The globes and orbits are within normal limits. IMPRESSION: 1. No acute intracranial abnormality or significant traumatic injury. 2. Mild atrophy and white matter changes are within normal limits for age. Electronically Signed   By: Marin Roberts M.D.   On: 02/06/2024 20:39   DG Shoulder Right  Portable Result Date: 02/06/2024 CLINICAL DATA:  Fall.  Pain. EXAM: RIGHT SHOULDER - 1 VIEW COMPARISON:  None Available. FINDINGS: Visualized portion of the right hemithorax is normal. No acute fracture or dislocation. Degenerative changes of the glenohumeral and acromioclavicular joints are moderate. IMPRESSION: Degenerative change, without acute osseous finding. Electronically Signed   By: Jeronimo Greaves M.D.   On: 02/06/2024 19:59        Scheduled Meds:  amLODipine  5 mg Oral Daily   Chlorhexidine Gluconate Cloth  6 each Topical Q0600   heparin  5,000 Units Subcutaneous Q8H   insulin aspart  0-15 Units Subcutaneous TID WC   insulin aspart  0-5 Units Subcutaneous QHS   insulin aspart  4 Units Subcutaneous TID WC   insulin glargine  20 Units Subcutaneous Daily   lidocaine  1 patch Transdermal Q24H   sodium chloride flush  3 mL Intravenous Q12H   sodium chloride flush  3 mL Intravenous Q12H   tamsulosin  0.8 mg Oral QPC supper   Continuous Infusions:  sodium chloride     lactated ringers       LOS: 1 day    Time spent: 55 minutes    Dorcas Carrow, MD [facility]

## 2024-02-08 ENCOUNTER — Inpatient Hospital Stay (HOSPITAL_COMMUNITY): Payer: Medicare (Managed Care)

## 2024-02-08 DIAGNOSIS — E1100 Type 2 diabetes mellitus with hyperosmolarity without nonketotic hyperglycemic-hyperosmolar coma (NKHHC): Secondary | ICD-10-CM | POA: Diagnosis not present

## 2024-02-08 LAB — CBC WITH DIFFERENTIAL/PLATELET
Abs Immature Granulocytes: 0.04 10*3/uL (ref 0.00–0.07)
Basophils Absolute: 0 10*3/uL (ref 0.0–0.1)
Basophils Relative: 0 %
Eosinophils Absolute: 0 10*3/uL (ref 0.0–0.5)
Eosinophils Relative: 1 %
HCT: 30.2 % — ABNORMAL LOW (ref 39.0–52.0)
Hemoglobin: 10.6 g/dL — ABNORMAL LOW (ref 13.0–17.0)
Immature Granulocytes: 1 %
Lymphocytes Relative: 29 %
Lymphs Abs: 2.5 10*3/uL (ref 0.7–4.0)
MCH: 32.4 pg (ref 26.0–34.0)
MCHC: 35.1 g/dL (ref 30.0–36.0)
MCV: 92.4 fL (ref 80.0–100.0)
Monocytes Absolute: 1 10*3/uL (ref 0.1–1.0)
Monocytes Relative: 12 %
Neutro Abs: 5.2 10*3/uL (ref 1.7–7.7)
Neutrophils Relative %: 57 %
Platelets: 222 10*3/uL (ref 150–400)
RBC: 3.27 MIL/uL — ABNORMAL LOW (ref 4.22–5.81)
RDW: 13.6 % (ref 11.5–15.5)
WBC: 8.9 10*3/uL (ref 4.0–10.5)
nRBC: 0 % (ref 0.0–0.2)

## 2024-02-08 LAB — GLUCOSE, CAPILLARY
Glucose-Capillary: 320 mg/dL — ABNORMAL HIGH (ref 70–99)
Glucose-Capillary: 186 mg/dL — ABNORMAL HIGH (ref 70–99)
Glucose-Capillary: 231 mg/dL — ABNORMAL HIGH (ref 70–99)
Glucose-Capillary: 186 mg/dL — ABNORMAL HIGH (ref 70–99)

## 2024-02-08 LAB — COMPREHENSIVE METABOLIC PANEL
ALT: 15 U/L (ref 0–44)
AST: 19 U/L (ref 15–41)
Albumin: 2.6 g/dL — ABNORMAL LOW (ref 3.5–5.0)
Alkaline Phosphatase: 100 U/L (ref 38–126)
Anion gap: 9 (ref 5–15)
BUN: 41 mg/dL — ABNORMAL HIGH (ref 8–23)
CO2: 24 mmol/L (ref 22–32)
Calcium: 8.9 mg/dL (ref 8.9–10.3)
Chloride: 97 mmol/L — ABNORMAL LOW (ref 98–111)
Creatinine, Ser: 1.37 mg/dL — ABNORMAL HIGH (ref 0.61–1.24)
GFR, Estimated: 55 mL/min — ABNORMAL LOW (ref >60)
Glucose, Bld: 377 mg/dL — ABNORMAL HIGH (ref 70–99)
Potassium: 3.9 mmol/L (ref 3.5–5.1)
Sodium: 130 mmol/L — ABNORMAL LOW (ref 135–145)
Total Bilirubin: 0.7 mg/dL (ref 0.0–1.2)
Total Protein: 6.3 g/dL — ABNORMAL LOW (ref 6.5–8.1)

## 2024-02-08 LAB — MAGNESIUM: Magnesium: 1.7 mg/dL (ref 1.7–2.4)

## 2024-02-08 MED ORDER — INSULIN GLARGINE 100 UNIT/ML ~~LOC~~ SOLN
30.0000 [IU] | Freq: Every day | SUBCUTANEOUS | Status: DC
  Administered 2024-02-08 – 2024-02-13 (×6): 30 [IU] via SUBCUTANEOUS
  Filled 2024-02-08 (×6): qty 0.3

## 2024-02-08 MED ORDER — GADOBUTROL 1 MMOL/ML IV SOLN
5.0000 mL | Freq: Once | INTRAVENOUS | Status: AC | PRN
  Administered 2024-02-08: 5 mL via INTRAVENOUS

## 2024-02-08 NOTE — Progress Notes (Signed)
 PROGRESS NOTE    Kevin [name]  ZOX:[id number] DOB: 1951/09/20 DOA: 02/06/2024 PCP: Marcine Matar, MD    Brief Narrative:  73 year old with history of insulin-dependent type 2 diabetes, history of gastrointestinal internal stromal tumor, essential hypertension, chronic hypokalemia, chronic current urgency, GERD and history of noncompliance presents to the ER with unwitnessed fall 2 days ago, voiding on himself, right arm pain.  In the emergency room blood pressure 203/95.  On room air.  Blood glucose more than 600, osmolality 326.  Beta hydroxybutyrate 2.99.  Sodium 122 and blood glucose 900 on serology.  Creatinine 2.02.  He was dribbling urine in the ER.  Declined to have catheter placed.  Skeletal survey negative for acute fractures.  Patient apparently was not taking any medications unknown how long.  Subjective:  Patient seen and examined.  Denies any complaints.  He is agreeable to referral to a SNF.  Blood sugar still elevated and gradually working on it.  Appetite is fair.  Assessment & Plan:   Hyperosmolar hyperglycemic state Hyponatremia secondary to hyperglycemia History of insulin-dependent DM type II with insulin noncompliance. History of recurrent hyperglycemic crisis in the past > Patient presented to emergency department complaining of fall 2 days ago at home and since then he is having right-sided shoulder spasm.  Patient is not taking neither insulin, home blood pressure regimen in fact not taking any home medications at all. -Previous A1c above 15 last checked in April 2024. --Presented with blood sugars more than 600.  Treated with IV insulin infusion overnight.  Anion gap closed.  Electrolytes corrected. -Increase dose of long-acting insulin to 30 today.  Prandial insulin 6 units 3 times daily.  Will continue to escalate dose of insulin gradually.  A1c pending.    Hypertensive emergency History essential hypertension-medication noncompliance -On presentation to  ED elevated blood pressure 203/95.  After giving labetalol blood pressure has been improved 154/98. - Starting IV hydralazine 10 mg every 6 hour as needed for systolic blood pressure above 811 or diastolic blood pressure above 914.  Goal to decrease blood pressure 25% in next 24 hours. -Of note patient is not taking lisinopril at home. -EKG showing sinus rhythm heart rate 63, left ventricular hypertrophy pattern. -Patient denies any chest pain, chest pressure and shortness of breath. -Starting oral amlodipine. -Continue hydralazine as needed.   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 - CT scan abdomen pelvis showed prominent bladder distention with bladder ultralong hydronephrosis and hydroureter and enlarged prostate associated with bladder outlet obstruction. -Creatinine 2.08.  Baseline creatinine within normal range GFR above 60. -UA showing increased blood glucose over 500, dipstick hemoglobin positive, ketone 20, normal RBC, normal WBC and no bacteria. - Acute kidney injury secondary to postrenal AKI BPH from bladder outlet obstruction and prerenal acute kidney injury in the setting of HHS. - Foley catheter inserted.  More than 1 L drained.  Will go home with catheter.  Outpatient urology follow-up. - Starting oral Flomax. -- Renal functions improving.  Discontinue IV fluids.    Fall at home -Patient presenting with complaining of fall at home 2 to 3 days ago.  Denies any hitting of the head and loss of consciousness.  Extensive imaging has been done which ruled out any acute fracture. - X-ray right sided shoulder degenerative change no fracture. -  CT head no acute intracranial abnormality. - CT  cervical spine no fracture or dislocation. - CT chest, abdomen and pelvis no acute abnormal finding. -Continue fall precaution - Work  with PT OT.  Refer to SNF.   Thyroid nodule -CT cervical spine showed incidental finding of thyroid nodule. -Radiology recommended nonemergent thyroid ultrasound which can be done outpatient setting. - TSH 1.75.    Anemia of chronic disease -CBC showing stable H&H 12.9 and 37.  Continue to monitor.    GERD -Not currently taking any Protonix.   History of gastrointestinal stromal tumor -Last clinic visit in oncology clinic in April 2024. -Per chart review oncology note patient was noncompliant with Gleevec which has been resumed on 03/17/2023.  However patient is not taking it currently.  Also patient has been lost to follow-up with the oncology clinic afterward. -On discharge need to refer to oncology clinic to reestablish care.   Transaminitis Hyperbilirubinemia -CMP showing transaminitis and elevated bilirubin 1.3.  Hyperbilirubinemia in the setting of dehydration from DKA percent blasts HHS. -However CT abdomen showed Suggestion of developing hypodense nodule in the liver measuring 1.8 cm diameter. Consider elective MRI follow-up further. -Per chart review patient has previous hepatic abscess which has been drained and completed course of antibiotic in the past. -Will order MRI of the liver today.  DVT prophylaxis: heparin injection 5,000 Units Start: 02/06/24 2200 SCDs Start: 02/06/24 2155 Place TED hose Start: 02/06/24 2155   Code Status: Full code Family Communication: None today. Disposition Plan: Status is: Inpatient Remains inpatient appropriate because: Needs skilled nursing facility.     Consultants:  None  Procedures:  None  Antimicrobials:  None     Objective: Vitals:   02/07/24 1938 02/07/24 2305 02/08/24 0401 02/08/24 0845  BP: 105/61 112/62 (!) 101/53 107/66  Pulse: 84 78 72 82  Resp: 18 12 13 17   Temp: 98.2 F (36.8 C) 97.8 F (36.6 C) 98.3 F (36.8 C) 98.2 F (36.8 C)  TempSrc: Oral Oral Oral Oral  SpO2: 95% 97% 96% 98%  Weight:       Height:        Intake/Output Summary (Last 24 hours) at 02/08/2024 1126 Last data filed at 02/08/2024 0900 Gross per 24 hour  Intake 970.05 ml  Output 2800 ml  Net -1829.95 ml   Filed Weights   02/06/24 1723 02/06/24 2251  Weight: 59.8 kg 53.1 kg    Examination:  General exam: Appears calm and comfortable.  Frail debilitated. Respiratory system: Clear to auscultation. Respiratory effort normal. Cardiovascular system: S1 & S2 heard, RRR.  Gastrointestinal system: Abdomen is nondistended, soft and nontender. No organomegaly or masses felt. Normal bowel sounds heard. Foley catheter with clear urine. Central nervous system: Alert and oriented. No focal neurological deficits. Extremities: Symmetric 5 x 5 power.    Data Reviewed: I have personally reviewed following labs and imaging studies  CBC: Recent Labs  Lab 02/06/24 1729 02/08/24 0311  WBC 5.4 8.9  NEUTROABS  --  5.2  HGB 12.9* 10.6*  HCT 37.8* 30.2*  MCV 93.6 92.4  PLT 256 222   Basic Metabolic Panel: Recent Labs  Lab 02/06/24 1729 02/07/24 0033 02/07/24 0330 02/07/24 0959 02/08/24 0311  NA 122* 126* 129* 130* 130*  K 4.3 3.1* 2.8* 4.0 3.9  CL 82* 87* 89* 93* 97*  CO2 21* 24 29 21* 24  GLUCOSE 878* 734* 469* 390* 377*  BUN 34* 43* 42* 40* 41*  CREATININE 2.08* 1.96* 1.87* 1.71* 1.37*  CALCIUM 9.1 8.9 8.8* 9.2 8.9  MG  --   --   --  1.7 1.7  PHOS  --   --   --  2.9  --    GFR: Estimated Creatinine Clearance: 36.6 mL/min (A) (by C-G formula based on SCr of 1.37 mg/dL (H)). Liver Function Tests: Recent Labs  Lab 02/06/24 1729 02/08/24 0311  AST 23 19  ALT 20 15  ALKPHOS 153* 100  BILITOT 1.3* 0.7  PROT 7.8 6.3*  ALBUMIN 3.3* 2.6*   Recent Labs  Lab 02/06/24 1729  LIPASE 24   No results for input(s): "AMMONIA" in the last 168 hours. Coagulation Profile: No results for input(s): "INR", "PROTIME" in the last 168 hours. Cardiac Enzymes: No results for input(s): "CKTOTAL", "CKMB", "CKMBINDEX",  "TROPONINI" in the last 168 hours. BNP (last 3 results) No results for input(s): "PROBNP" in the last 8760 hours. HbA1C: No results for input(s): "HGBA1C" in the last 72 hours. CBG: Recent Labs  Lab 02/07/24 0712 02/07/24 1127 02/07/24 1528 02/07/24 2056 02/08/24 0539  GLUCAP 340* 444* 378* 349* 320*   Lipid Profile: No results for input(s): "CHOL", "HDL", "LDLCALC", "TRIG", "CHOLHDL", "LDLDIRECT" in the last 72 hours. Thyroid Function Tests: Recent Labs    02/06/24 2011  TSH 1.753  FREET4 0.85   Anemia Panel: No results for input(s): "VITAMINB12", "FOLATE", "FERRITIN", "TIBC", "IRON", "RETICCTPCT" in the last 72 hours. Sepsis Labs: No results for input(s): "PROCALCITON", "LATICACIDVEN" in the last 168 hours.  No results found for this or any previous visit (from the past 240 hours).       Radiology Studies: ECHOCARDIOGRAM COMPLETE Result Date: 02/07/2024    ECHOCARDIOGRAM REPORT   Patient Name:   ISIAIH HOLLENBACH Date of Exam: 02/07/2024 Medical Rec #:  [id number]      Height:       62.0 in Accession #:    [id number]     Weight:       117.1 lb Date of Birth:  1951-07-27      BSA:          1.523 m Patient Age:    73 years       BP:           99/63 mmHg Patient Gender: M              HR:           79 bpm. Exam Location:  Inpatient Procedure: 2D Echo, Cardiac Doppler, Color Doppler and 3D Echo (Both Spectral            and Color Flow Doppler were utilized during procedure). Indications:    Hypertension Emergency  History:        Patient has no prior history of Echocardiogram examinations.                 Risk Factors:Hypertension, Diabetes and Current Smoker.  Sonographer:    Lucendia Herrlich RCS Referring Phys: [id number] SUBRINA SUNDIL IMPRESSIONS  1. Left ventricular ejection fraction, by estimation, is 60 to 65%. Left ventricular ejection fraction by 3D volume is 62 %. The left ventricle has normal function. The left ventricle has no regional wall motion abnormalities. Left ventricular  diastolic  parameters are consistent with Grade I diastolic dysfunction (impaired relaxation).  2. Right ventricular systolic function is normal. The right ventricular size is normal. Tricuspid regurgitation signal is inadequate for assessing PA pressure.  3. Left atrial size was mildly dilated.  4. The mitral valve is grossly normal. No evidence of mitral valve regurgitation.  5. The aortic valve is tricuspid. Aortic valve regurgitation is  not visualized.  6. The inferior vena cava is normal in size with greater than 50% respiratory variability, suggesting right atrial pressure of 3 mmHg. Comparison(s): No prior Echocardiogram. FINDINGS  Left Ventricle: Left ventricular ejection fraction, by estimation, is 60 to 65%. Left ventricular ejection fraction by 3D volume is 62 %. The left ventricle has normal function. The left ventricle has no regional wall motion abnormalities. Strain imaging was not performed. The left ventricular internal cavity size was normal in size. There is no left ventricular hypertrophy. Left ventricular diastolic parameters are consistent with Grade I diastolic dysfunction (impaired relaxation). Indeterminate filling pressures. Right Ventricle: The right ventricular size is normal. No increase in right ventricular wall thickness. Right ventricular systolic function is normal. Tricuspid regurgitation signal is inadequate for assessing PA pressure. Left Atrium: Left atrial size was mildly dilated. Right Atrium: Right atrial size was normal in size. Pericardium: There is no evidence of pericardial effusion. Mitral Valve: The mitral valve is grossly normal. No evidence of mitral valve regurgitation. Tricuspid Valve: The tricuspid valve is normal in structure. Tricuspid valve regurgitation is not demonstrated. Aortic Valve: The aortic valve is tricuspid. Aortic valve regurgitation is not visualized. Aortic valve peak gradient measures 5.9 mmHg. Pulmonic Valve: The pulmonic valve was normal in  structure. Pulmonic valve regurgitation is not visualized. Aorta: The aortic root and ascending aorta are structurally normal, with no evidence of dilitation. Venous: The inferior vena cava is normal in size with greater than 50% respiratory variability, suggesting right atrial pressure of 3 mmHg. IAS/Shunts: No atrial level shunt detected by color flow Doppler. Additional Comments: 3D was performed not requiring image post processing on an independent workstation and was normal.  LEFT VENTRICLE PLAX 2D LVIDd:         4.30 cm         Diastology LVIDs:         2.80 cm         LV e' medial:    5.98 cm/s LV PW:         1.10 cm         LV E/e' medial:  7.6 LV IVS:        1.00 cm         LV e' lateral:   8.59 cm/s LVOT diam:     2.40 cm         LV E/e' lateral: 5.3 LV SV:         64 LV SV Index:   42 LVOT Area:     4.52 cm        3D Volume EF                                LV 3D EF:    Left                                             ventricul                                             ar  ejection                                             fraction                                             by 3D                                             volume is                                             62 %.                                 3D Volume EF:                                3D EF:        62 %                                LV EDV:       160 ml                                LV ESV:       60 ml                                LV SV:        100 ml RIGHT VENTRICLE             IVC RV S prime:     20.30 cm/s  IVC diam: 1.20 cm TAPSE (M-mode): 2.0 cm LEFT ATRIUM           Index        RIGHT ATRIUM          Index LA diam:      3.00 cm 1.97 cm/m   RA Area:     9.77 cm LA Vol (A2C): 56.3 ml 36.98 ml/m  RA Volume:   14.80 ml 9.72 ml/m LA Vol (A4C): 35.9 ml 23.58 ml/m  AORTIC VALVE AV Area (Vmax): 3.16 cm AV Vmax:        121.00 cm/s AV Peak Grad:   5.9 mmHg LVOT Vmax:      84.40  cm/s LVOT Vmean:     54.500 cm/s LVOT VTI:       0.141 m  AORTA Ao Root diam: 3.90 cm Ao Asc diam:  3.30 cm MITRAL VALVE MV Area (PHT): 3.65 cm    SHUNTS MV Decel Time: 208 msec    Systemic VTI:  0.14 m MV E velocity: 45.70 cm/s  Systemic Diam: 2.40 cm MV A velocity: 65.10 cm/s  MV E/A ratio:  0.70 Zoila Shutter MD Electronically signed by Zoila Shutter MD Signature Date/Time: 02/07/2024/3:35:15 PM    Final    CT CHEST ABDOMEN PELVIS WO CONTRAST Result Date: 02/06/2024 CLINICAL DATA:  Poly trauma, blunt.  Trauma due to a fall. EXAM: CT CHEST, ABDOMEN AND PELVIS WITHOUT CONTRAST TECHNIQUE: Multidetector CT imaging of the chest, abdomen and pelvis was performed following the standard protocol without IV contrast. RADIATION DOSE REDUCTION: This exam was performed according to the departmental dose-optimization program which includes automated exposure control, adjustment of the mA and/or kV according to patient size and/or use of iterative reconstruction technique. COMPARISON:  Chest radiograph 06/05/2022. CT abdomen and pelvis 06/05/2022. FINDINGS: CT CHEST FINDINGS Cardiovascular: Normal heart size. No pericardial effusions. Normal caliber thoracic aorta. Minimal calcification in the aorta. Coronary artery calcifications. Mediastinum/Nodes: No mediastinal gas or hematoma. Esophagus is decompressed. No significant lymphadenopathy. Multiple thyroid nodules, largest on the right measuring 3.1 cm diameter. Lungs/Pleura: Lungs are clear. No pleural effusion or pneumothorax. Few scattered subcentimeter calcified granulomas are present. No significant pulmonary nodules. No imaging follow-up is indicated. Musculoskeletal: Degenerative changes in the spine. No acute bony abnormalities. CT ABDOMEN PELVIS FINDINGS Hepatobiliary: Poorly defined low-attenuation lesion in the dome of the liver measuring 1.8 cm diameter. This is new since previous study. Consider follow-up with elective MRI to exclude a developing liver mass.  Gallbladder and bile ducts are unremarkable. Pancreas: Unremarkable. No pancreatic ductal dilatation or surrounding inflammatory changes. Spleen: Spleen is surgically absent. Adrenals/Urinary Tract: No adrenal gland nodules. Left renal cysts are unchanged since prior study. No imaging follow-up is indicated. Bilateral hydronephrosis and hydroureter. No obstructing stone or mass is identified. The bladder is diffusely fluid distended. Changes likely to represent reflux or stasis, possibly associated with bladder outlet obstruction. No bladder wall thickening or filling defect. Stomach/Bowel: Postoperative changes in the stomach. Stomach, small bowel, and colon are not abnormally distended. No wall thickening or inflammatory changes. Appendix is normal. Vascular/Lymphatic: No significant vascular findings are present. No enlarged abdominal or pelvic lymph nodes. Reproductive: Prostate gland is enlarged, measuring 6.4 cm diameter. Other: No free air or free fluid in the abdomen. Abdominal wall musculature appears intact. No mesenteric hematomas. Musculoskeletal: Degenerative changes in the lumbar spine. No acute bony abnormalities. IMPRESSION: 1. No acute posttraumatic changes demonstrated in the chest, abdomen, or pelvis. 2. Multiple thyroid nodules, most prominent a 3.1 cm calcified nodule in the right. Recommend follow-up with elective thyroid US (Ref: J Am Coll Radiol. 2015 Feb;12(2): 143-50). 3. Prominent bladder distention with bilateral hydronephrosis and hydroureter likely representing stasis or reflux, likely associated with bladder outlet obstruction. Prostate gland is diffusely enlarged. 4. Suggestion of developing hypodense nodule in the liver measuring 1.8 cm diameter. Consider elective MRI follow-up further characterization. Electronically Signed   By: Burman Nieves M.D.   On: 02/06/2024 20:54   CT Cervical Spine Wo Contrast Result Date: 02/06/2024 CLINICAL DATA:  Fall 2-3 days ago.  Patient has  been down since. EXAM: CT CERVICAL SPINE WITHOUT CONTRAST TECHNIQUE: Multidetector CT imaging of the cervical spine was performed without intravenous contrast. Multiplanar CT image reconstructions were also generated. RADIATION DOSE REDUCTION: This exam was performed according to the departmental dose-optimization program which includes automated exposure control, adjustment of the mA and/or kV according to patient size and/or use of iterative reconstruction technique. COMPARISON:  None Available. FINDINGS: Alignment: No significant listhesis is present. Normal cervical lordosis is present. Skull base and vertebrae: The vertebral body heights are normal. No acute or  healing fractures are present. Soft tissues and spinal canal: A calcified nodule the right lobe of the thyroid measures 3.4 x 3.0 x 4.3 cm. Additional nodules are present in the isthmus and inferior left lobe. This displaces the airway slightly to the left. No significant adenopathy is present. Disc levels: Multilevel right-sided facet and uncovertebral spurring leads to moderate right foraminal stenosis at C4-5, C5-6 and C6-7. Upper chest: The lung apices are clear. The thoracic inlet is within normal limits. IMPRESSION: 1. No acute or healing fractures of the cervical spine. 2. Multilevel right-sided facet and uncovertebral spurring leads to moderate right foraminal stenosis at C4-5, C5-6 and C6-7. 3. 3.4 x 3.0 x 4.3 cm calcified nodule in the right lobe of the thyroid. Additional nodules are present in the isthmus and inferior left lobe. Recommend non-emergent thyroid ultrasound. Reference: J Am Coll Radiol. 2015 Feb;12(2): 143-50 Electronically Signed   By: Marin Roberts M.D.   On: 02/06/2024 20:43   CT Head Wo Contrast Result Date: 02/06/2024 CLINICAL DATA:  Head trauma. Patient fell 2-3 days ago and has been down since. EXAM: CT HEAD WITHOUT CONTRAST TECHNIQUE: Contiguous axial images were obtained from the base of the skull through the  vertex without intravenous contrast. RADIATION DOSE REDUCTION: This exam was performed according to the departmental dose-optimization program which includes automated exposure control, adjustment of the mA and/or kV according to patient size and/or use of iterative reconstruction technique. COMPARISON:  None Available. FINDINGS: Brain: Mild atrophy and white matter changes are within normal limits for age. No acute infarct, hemorrhage, or mass lesion is present. Deep brain nuclei are within normal limits. The ventricles are of normal size. No significant extraaxial fluid collection is present. The brainstem and cerebellum are within normal limits. Midline structures are within normal limits. Vascular: Atherosclerotic calcifications are present within the cavernous internal carotid arteries bilaterally. No hyperdense vessel is present. Skull: Calvarium is intact. No focal lytic or blastic lesions are present. No significant extracranial soft tissue lesion is present. Sinuses/Orbits: The paranasal sinuses and mastoid air cells are clear. The globes and orbits are within normal limits. IMPRESSION: 1. No acute intracranial abnormality or significant traumatic injury. 2. Mild atrophy and white matter changes are within normal limits for age. Electronically Signed   By: Marin Roberts M.D.   On: 02/06/2024 20:39   DG Shoulder Right Portable Result Date: 02/06/2024 CLINICAL DATA:  Fall.  Pain. EXAM: RIGHT SHOULDER - 1 VIEW COMPARISON:  None Available. FINDINGS: Visualized portion of the right hemithorax is normal. No acute fracture or dislocation. Degenerative changes of the glenohumeral and acromioclavicular joints are moderate. IMPRESSION: Degenerative change, without acute osseous finding. Electronically Signed   By: Jeronimo Greaves M.D.   On: 02/06/2024 19:59        Scheduled Meds:  amLODipine  5 mg Oral Daily   Chlorhexidine Gluconate Cloth  6 each Topical Q0600   heparin  5,000 Units Subcutaneous Q8H    insulin aspart  0-15 Units Subcutaneous TID WC   insulin aspart  0-5 Units Subcutaneous QHS   insulin aspart  6 Units Subcutaneous TID WC   insulin glargine  30 Units Subcutaneous Daily   lidocaine  1 patch Transdermal Q24H   sodium chloride flush  3 mL Intravenous Q12H   sodium chloride flush  3 mL Intravenous Q12H   tamsulosin  0.8 mg Oral QPC supper   Continuous Infusions:     LOS: 2 days    Time spent: 50 minutes  Dorcas Carrow, MD [facility]

## 2024-02-08 NOTE — [place]2 (Signed)
 [place] MEDICAID FL2 LEVEL OF CARE FORM     IDENTIFICATION  Patient Name: Kevin [name] Birthdate: [birth year] Sex: male Admission Date (Current Location): 02/06/2024  [facility] and [place] Number:  Producer, television/film/video and Address:  The . [facility], [address], [place], [place] [address]      Provider Number: [id number]  Attending Physician Name and Address:  Dorcas Carrow, MD  Relative Name and Phone Number:       Current Level of Care: Hospital Recommended Level of Care: Skilled Nursing Facility Prior Approval Number:    Date Approved/Denied:   PASRR Number: [id number]  Discharge Plan: SNF    Current Diagnoses: Patient Active Problem List   Diagnosis Date Noted   Type 2 diabetes mellitus with hyperosmolar hyperglycemic state (HHS) (HCC) 02/06/2024   Insulin dependent type 2 diabetes mellitus (HCC) 02/06/2024   Hyperosmolar hyponatremia 02/06/2024   Hypertensive emergency 02/06/2024   BPH (benign prostatic hyperplasia) 02/06/2024   Bilateral hydronephrosis 02/06/2024   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 (HCC) 02/06/2024   GERD (gastroesophageal reflux disease) 02/06/2024   Thyroid nodule 02/06/2024   History of fall 02/06/2024   Sepsis (HCC) 06/06/2022   Generalized weakness 06/06/2022   Chronic anemia 06/06/2022   Gastrointestinal stromal tumor (GIST) (HCC) 05/29/2022   SIRS (systemic inflammatory response syndrome) (HCC) 04/17/2022   Lymphocytopenia 04/17/2022   Positive blood culture 04/17/2022   Hypocalcemia 04/15/2022   Elevated troponin 04/15/2022   Essential hypertension 05/11/2021   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)    DNR (do not resuscitate)    Palliative care by specialist    Protein-calorie malnutrition, severe 01/31/2020   Weight loss, unintentional 01/24/2020   Tobacco abuse 01/24/2020   Gastric mass 01/22/2020   Normocytic anemia 01/21/2020   Mass of  stomach 01/21/2020    Orientation RESPIRATION BLADDER Height & Weight     Self, Time, Situation, Place  Normal Indwelling catheter Weight: 117 lb 1 oz (53.1 kg) Height:  5\' 2"  (157.5 cm)  BEHAVIORAL SYMPTOMS/MOOD NEUROLOGICAL BOWEL NUTRITION STATUS      Continent Diet  AMBULATORY STATUS COMMUNICATION OF NEEDS Skin   Limited Assist Verbally Normal                       Personal Care Assistance Level of Assistance  Bathing, Feeding, Dressing Bathing Assistance: Limited assistance Feeding assistance: Limited assistance Dressing Assistance: Limited assistance     Functional Limitations Info  Sight, Hearing, Speech Sight Info: Adequate Hearing Info: Adequate Speech Info: Adequate    SPECIAL CARE FACTORS FREQUENCY  PT (By licensed PT), OT (By licensed OT)     PT Frequency: 5x a week OT Frequency: 5x a week            Contractures Contractures Info: Not present    Additional Factors Info  Code Status, Allergies Code Status Info: full Allergies Info: NKA           Current Medications (02/08/2024):  This is the current hospital active medication list Current Facility-Administered Medications  Medication Dose Route Frequency Provider Last Rate Last Admin   acetaminophen (TYLENOL) tablet 650 mg  650 mg Oral Q6H PRN Janalyn Shy, Subrina, MD       Or   acetaminophen (TYLENOL) suppository 650 mg  650 mg Rectal Q6H PRN Janalyn Shy, Subrina, MD       amLODipine (NORVASC) tablet 5 mg  5 mg Oral Daily Tereasa Coop, MD  5 mg at 02/08/24 1610   Chlorhexidine Gluconate Cloth 2 % PADS 6 each  6 each Topical Q0600 Dorcas Carrow, MD   6 each at 02/08/24 0641   dextrose 50 % solution 0-50 mL  0-50 mL Intravenous PRN Sundil, Subrina, MD       gadobutrol (GADAVIST) 1 MMOL/ML injection 5 mL  5 mL Intravenous Once PRN Dorcas Carrow, MD       heparin injection 5,000 Units  5,000 Units Subcutaneous Q8H Sundil, Subrina, MD   5,000 Units at 02/08/24 1407   hydrALAZINE (APRESOLINE) injection  10 mg  10 mg Intravenous Q6H PRN Sundil, Subrina, MD       insulin aspart (novoLOG) injection 0-15 Units  0-15 Units Subcutaneous TID WC Dorcas Carrow, MD   3 Units at 02/08/24 1158   insulin aspart (novoLOG) injection 0-5 Units  0-5 Units Subcutaneous QHS Dorcas Carrow, MD   4 Units at 02/07/24 2101   insulin aspart (novoLOG) injection 6 Units  6 Units Subcutaneous TID WC Dorcas Carrow, MD   6 Units at 02/08/24 9604   insulin glargine (LANTUS) injection 30 Units  30 Units Subcutaneous Daily Dorcas Carrow, MD   30 Units at 02/08/24 0914   lidocaine (LIDODERM) 5 % 1 patch  1 patch Transdermal Q24H Janalyn Shy, Subrina, MD   1 patch at 02/07/24 2354   ondansetron (ZOFRAN) tablet 4 mg  4 mg Oral Q6H PRN Janalyn Shy, Subrina, MD       Or   ondansetron [facility]) injection 4 mg  4 mg Intravenous Q6H PRN Sundil, Subrina, MD       sodium chloride flush (NS) 0.9 % injection 3 mL  3 mL Intravenous Q12H Sundil, Subrina, MD   3 mL at 02/08/24 0917   sodium chloride flush (NS) 0.9 % injection 3 mL  3 mL Intravenous Q12H Sundil, Subrina, MD   3 mL at 02/08/24 5409   sodium chloride flush (NS) 0.9 % injection 3 mL  3 mL Intravenous PRN Janalyn Shy, Subrina, MD       tamsulosin [facility]) capsule 0.8 mg  0.8 mg Oral QPC supper Sundil, Subrina, MD   0.8 mg at 02/07/24 1758     Discharge Medications: Please see discharge summary for a list of discharge medications.  Relevant Imaging Results:  Relevant Lab Results:   Additional Information SSN: [id number]  Jimmy Picket, LCSW

## 2024-02-08 NOTE — Plan of Care (Signed)

## 2024-02-08 NOTE — TOC Initial Note (Signed)
 Transition of Care [facility]) - Initial/Assessment Note    Patient Details  Name: Kevin [name] MRN: [id number] Date of Birth: January 04, 1951  [facility]) CM/SW Contact:    Jimmy Picket, LCSW Phone Number: 02/08/2024, 3:10 PM  Clinical Narrative:                  CSW received SNF consult. CSW met with pt at bedside. CSW used a spanish interpreter on the ipad to communicate with pt. CSW introduced self and explained role at the hospital. Pt reports that PTA he lives in a hotel with his wife. Pt reports he was independent with mobility and ADL's.   CSW reviewed PT/OT recommendations for SNF. Pt reports he is ok with . Pt gave CSW permission to fax out to facilities in the area. Pt has no preference of facility at this time. CSW gave pt medicare.gov rating list to review. CSW explained insurance auth process.  CSW will continue to follow.   Expected Discharge Plan: Skilled Nursing Facility Barriers to Discharge: Continued Medical Work up   Patient Goals and CMS Choice Patient states their goals for this hospitalization and ongoing recovery are:: To go to rehab CMS Medicare.gov Compare Post Acute Care list provided to:: Patient Choice offered to / list presented to : Patient      Expected Discharge Plan and Services       Living arrangements for the past 2 months: Hotel/Motel                                      Prior Living Arrangements/Services Living arrangements for the past 2 months: Hotel/Motel Lives with:: Spouse Patient language and need for interpreter reviewed:: Yes (Spanish interpertor on ipad) Do you feel safe going back to the place where you live?: Yes      Need for Family Participation in Patient Care: Yes (Comment) Care giver support system in place?: Yes (comment)   Criminal Activity/Legal Involvement Pertinent to Current Situation/Hospitalization: Yes - Comment as needed  Activities of Daily Living   ADL Screening (condition at time of  admission) Independently performs ADLs?: Yes (appropriate for developmental age) Is the patient deaf or have difficulty hearing?: No Does the patient have difficulty seeing, even when wearing glasses/contacts?: No Does the patient have difficulty concentrating, remembering, or making decisions?: No  Permission Sought/Granted Permission sought to share information with : Case Manager, Family Supports Permission granted to share information with : Yes, Verbal Permission Granted     Permission granted to share info w AGENCY: SNF        Emotional Assessment Appearance:: Appears stated age Attitude/Demeanor/Rapport: Engaged Affect (typically observed): Stable Orientation: : Oriented to Self, Oriented to Place, Oriented to  Time, Oriented to Situation Alcohol / Substance Use: Not Applicable Psych Involvement: No (comment)  Admission diagnosis:  Urinary retention [R33.9] Cervical radiculopathy [M54.12] AKI (acute kidney injury) (HCC) [N17.9] Hypertension, unspecified type [I10] Type 2 diabetes mellitus with hyperosmolar hyperglycemic state (HHS) (HCC) [E11.00] Patient Active Problem List   Diagnosis Date Noted   Type 2 diabetes mellitus with hyperosmolar hyperglycemic state (HHS) (HCC) 02/06/2024   Insulin dependent type 2 diabetes mellitus (HCC) 02/06/2024   Hyperosmolar hyponatremia 02/06/2024   Hypertensive emergency 02/06/2024   BPH (benign prostatic hyperplasia) 02/06/2024   Bilateral hydronephrosis 02/06/2024   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 (HCC) 02/06/2024   GERD (gastroesophageal reflux  disease) 02/06/2024   Thyroid nodule 02/06/2024   History of fall 02/06/2024   Sepsis (HCC) 06/06/2022   Generalized weakness 06/06/2022   Chronic anemia 06/06/2022   Gastrointestinal stromal tumor (GIST) (HCC) 05/29/2022   SIRS (systemic inflammatory response syndrome) (HCC) 04/17/2022   Lymphocytopenia 04/17/2022   Positive blood culture 04/17/2022    Hypocalcemia 04/15/2022   Elevated troponin 04/15/2022   Essential hypertension 05/11/2021   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 [address] Phone: [phone] Fax: [phone]  [place] [facility] [address] [place] [address] Phone: [phone] Fax: [phone]  [facility] [address] [place] [address] Phone: [phone] Fax: [phone]     Social Drivers of Health (SDOH) Social History: SDOH Screenings   Food Insecurity: No Food Insecurity (02/06/2024)  Housing: Low Risk  (02/06/2024)  Transportation Needs: No Transportation Needs (02/06/2024)  Utilities: Not At Risk (02/06/2024)  Depression (PHQ2-9): Low Risk  (08/16/2021)  Social Connections: Moderately Integrated (02/06/2024)  Tobacco Use: Medium Risk (02/06/2024)   SDOH Interventions:     Readmission Risk Interventions     No data to display

## 2024-02-09 DIAGNOSIS — E1100 Type 2 diabetes mellitus with hyperosmolarity without nonketotic hyperglycemic-hyperosmolar coma (NKHHC): Secondary | ICD-10-CM | POA: Diagnosis not present

## 2024-02-09 LAB — HEMOGLOBIN A1C
Hgb A1c MFr Bld: >15.5 % — ABNORMAL HIGH (ref 4.8–5.6)
Mean Plasma Glucose: >398 mg/dL

## 2024-02-09 LAB — GLUCOSE, CAPILLARY
Glucose-Capillary: 120 mg/dL — ABNORMAL HIGH (ref 70–99)
Glucose-Capillary: 130 mg/dL — ABNORMAL HIGH (ref 70–99)
Glucose-Capillary: 147 mg/dL — ABNORMAL HIGH (ref 70–99)
Glucose-Capillary: 156 mg/dL — ABNORMAL HIGH (ref 70–99)
Glucose-Capillary: 240 mg/dL — ABNORMAL HIGH (ref 70–99)
Glucose-Capillary: 443 mg/dL — ABNORMAL HIGH (ref 70–99)
Glucose-Capillary: 66 mg/dL — ABNORMAL LOW (ref 70–99)

## 2024-02-09 MED ORDER — INSULIN ASPART 100 UNIT/ML IJ SOLN
0.0000 [IU] | Freq: Three times a day (TID) | INTRAMUSCULAR | Status: DC
Start: 2024-02-09 — End: 2024-02-09
  Administered 2024-02-09: 2 [IU] via SUBCUTANEOUS

## 2024-02-09 MED ORDER — INSULIN ASPART 100 UNIT/ML IJ SOLN: 0.0000 [IU] | Freq: Three times a day (TID) | INTRAMUSCULAR | Status: DC

## 2024-02-09 MED ORDER — INSULIN ASPART 100 UNIT/ML IJ SOLN: 0.0000 [IU] | Freq: Every day | INTRAMUSCULAR | Status: DC

## 2024-02-09 MED ORDER — INSULIN ASPART 100 UNIT/ML IJ SOLN
0.0000 [IU] | Freq: Three times a day (TID) | INTRAMUSCULAR | Status: DC
  Administered 2024-02-09: 15 [IU] via SUBCUTANEOUS
  Administered 2024-02-10: 8 [IU] via SUBCUTANEOUS
  Administered 2024-02-11 (×2): 3 [IU] via SUBCUTANEOUS
  Administered 2024-02-11: 8 [IU] via SUBCUTANEOUS
  Administered 2024-02-12: 2 [IU] via SUBCUTANEOUS
  Administered 2024-02-12: 3 [IU] via SUBCUTANEOUS
  Administered 2024-02-12: 11 [IU] via SUBCUTANEOUS
  Administered 2024-02-13: 3 [IU] via SUBCUTANEOUS

## 2024-02-09 MED ORDER — LIVING WELL WITH DIABETES BOOK - IN SPANISH
Freq: Once | Status: AC
  Filled 2024-02-09: qty 1

## 2024-02-09 NOTE — Progress Notes (Signed)
 Physical Therapy Treatment Patient Details Name: Kevin [name] MRN: [id number] DOB: 1951-03-16 Today's Date: 02/09/2024   History of Present Illness Pt is a 73 y.o. male presenting 02/06/24 s/p unwitnessed fall 2-3 days ago. Admitted with Type 2 diabetes mellitus with hyperosmolar hyperglycemic state and BP of 203/95. PMH: insulin-dependent DM type II, history of gastrointestinal internal stromal tumor, thyroid nodule, HTN, chronic muscle spasm, chronic hypokalemia, chronic iron deficiency anemia, GERD, BPH, protein calorie malnutrition, and history of noncompliance with insulin regimen and multiple episodes of hypoglycemia    PT Comments  Pt resting in bed on arrival and agreeable to session with continued progress towards acute goals. Pt requiring grossly CGA for be mobility and transfers and min A during gait without AD support. Pt with some drift R/L in hall during gait, exacerbated with mild dynamic balance challenges with pt reaching for rail support to self steady and requiring min A to steady when rail not present. Pt continues to be limited in safe mobility by impaired balance/postural reactions, decreased activity tolerance and decreased insight into safety and deficits. Patient will benefit from continued inpatient follow up therapy, <3 hours/day. Pt continues to benefit from skilled PT services to progress toward functional mobility goals.      If plan is discharge home, recommend the following: A little help with walking and/or transfers;A little help with bathing/dressing/bathroom;Assistance with cooking/housework;Direct supervision/assist for medications management;Assist for transportation;Help with stairs or ramp for entrance   Can travel by private vehicle     Yes  Equipment Recommendations  Rolling walker (2 wheels)    Recommendations for Other Services       Precautions / Restrictions Precautions Precautions: Fall Restrictions Weight Bearing Restrictions Per Provider  Order: No     Mobility  Bed Mobility Overal bed mobility: Needs Assistance Bed Mobility: Supine to Sit, Sit to Supine     Supine to sit: Contact guard, Used rails, HOB elevated Sit to supine: Contact guard assist   General bed mobility comments: Pt performs bed mobility with increased time and CGA for safety.    Transfers Overall transfer level: Needs assistance Equipment used: None Transfers: Sit to/from Stand, Bed to chair/wheelchair/BSC Sit to Stand: Contact guard assist, Min assist           General transfer comment: CGA for safety, steady rise    Ambulation/Gait Ambulation/Gait assistance: Min assist Gait Distance (Feet): 150 Feet (x2) Assistive device: None Gait Pattern/deviations: Step-through pattern, Decreased stride length, Shuffle, Trunk flexed, Drifts right/left, Narrow base of support Gait velocity: reduced     General Gait Details: min A to steady with dynamic balance challenges (head turns, change in gait speed) pt reaching for rail support to self steady PRN   Stairs             Wheelchair Mobility     Tilt Bed    Modified Rankin (Stroke Patients Only)       Balance Overall balance assessment: Needs assistance Sitting-balance support: Single extremity supported, No upper extremity supported, Feet supported Sitting balance-Leahy Scale: Good Sitting balance - Comments: sitting EOB and in recliner   Standing balance support: Reliant on assistive device for balance, During functional activity, Bilateral upper extremity supported Standing balance-Leahy Scale: Fair Standing balance comment: able to accept mild ballance chalnges with some drift but no overt LOB             High level balance activites: Side stepping, Backward walking, Turns, Sudden stops, Head turns High Level Balance Comments: mild instability  with challenges            Communication Communication Communication: Impaired Factors Affecting Communication: Other  (comment) (Non-English speaking.)  Cognition Arousal: Alert Behavior During Therapy: WFL for tasks assessed/performed   PT - Cognitive impairments: Safety/Judgement, Problem solving, Attention, Sequencing                       PT - Cognition Comments: cues for safety Following commands: Impaired Following commands impaired: Only follows one step commands consistently, Follows multi-step commands inconsistently, Follows multi-step commands with increased time    Cueing Cueing Techniques: Verbal cues, Tactile cues, Visual cues  Exercises      General Comments General comments (skin integrity, edema, etc.): VSS on RA      Pertinent Vitals/Pain Pain Assessment Pain Assessment: No/denies pain Pain Intervention(s): Monitored during session    Home Living                          Prior Function            PT Goals (current goals can now be found in the care plan section) Acute Rehab PT Goals Patient Stated Goal: improve mobility PT Goal Formulation: With patient/family Time For Goal Achievement: 02/21/24 Progress towards PT goals: Progressing toward goals    Frequency    Min 1X/week      PT Plan      Co-evaluation              AM-PAC PT "6 Clicks" Mobility   Outcome Measure  Help needed turning from your back to your side while in a flat bed without using bedrails?: A Little Help needed moving from lying on your back to sitting on the side of a flat bed without using bedrails?: A Little Help needed moving to and from a bed to a chair (including a wheelchair)?: A Little Help needed standing up from a chair using your arms (e.g., wheelchair or bedside chair)?: A Little Help needed to walk in hospital room?: A Little Help needed climbing 3-5 steps with a railing? : Total 6 Click Score: 16    End of Session Equipment Utilized During Treatment: Gait belt Activity Tolerance: Patient tolerated treatment well Patient left: in chair;with call  bell/phone within reach;with chair alarm set Nurse Communication: Mobility status PT Visit Diagnosis: Unsteadiness on feet (R26.81);Other abnormalities of gait and mobility (R26.89);Muscle weakness (generalized) (M62.81);History of falling (Z91.81);Difficulty in walking, not elsewhere classified (R26.2);Pain Pain - part of body:  (abdomen)     Time: 8119-1478 PT Time Calculation (min) (ACUTE ONLY): 15 min  Charges:    $Gait Training: 8-22 mins PT General Charges $$ ACUTE PT VISIT: 1 Visit                     Yaacov Koziol R. PTA Acute Rehabilitation Services Office: [phone]   Catalina Antigua 02/09/2024, 4:32 PM

## 2024-02-09 NOTE — Progress Notes (Signed)
 PROGRESS NOTE    Kevin [name]  JYN:[id number] DOB: Mar 19, 1951 DOA: 02/06/2024 PCP: Marcine Matar, MD    Brief Narrative:  73 year old with history of insulin-dependent type 2 diabetes, history of gastrointestinal internal stromal tumor, essential hypertension, chronic hypokalemia, chronic current urgency, GERD and history of noncompliance presents to the ER with unwitnessed fall 2 days ago, voiding on himself, right arm pain.  In the emergency room blood pressure 203/95.  On room air.  Blood glucose more than 600, osmolality 326.  Beta hydroxybutyrate 2.99.  Sodium 122 and blood glucose 900 on serology.  Creatinine 2.02.  He was dribbling urine in the ER.  Declined to have catheter placed.  Skeletal survey negative for acute fractures.  Patient apparently was not taking any medications unknown how long.  Subjective:  Patient seen and examined.  Denies any complaints today.  He is agreeable to go to rehab.  Blood sugars are better controlled today. I told patient about MRI results and asked him to keep up the follow-up with his cancer doctor.  He is agreeable.  Assessment & Plan:   Hyperosmolar hyperglycemic state Hyponatremia secondary to hyperglycemia History of insulin-dependent DM type II with insulin noncompliance. History of recurrent hyperglycemic crisis in the past > Patient presented to emergency department complaining of fall 2 days ago at home and since then he is having right-sided shoulder spasm.  Patient is not taking neither insulin, home blood pressure regimen in fact not taking any home medications at all. -Previous A1c above 15 last checked in April 2024. --Presented with blood sugars more than 600.  Treated with IV insulin infusion overnight.  Anion gap closed.  Electrolytes corrected. -Keep on long-acting insulin 30, prandial insulin 6 3 times daily.  A1c still pending.    Hypertensive emergency History essential hypertension-medication noncompliance -On  presentation to ED elevated blood pressure 203/95.  After giving labetalol blood pressure has been improved 154/98. -Not taking medications at home.  Started on amlodipine.  Fairly stable today.   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 - CT scan abdomen pelvis showed prominent bladder distention with bladder ultralong hydronephrosis and hydroureter and enlarged prostate associated with bladder outlet obstruction. -Creatinine 2.08.  Baseline creatinine within normal range GFR above 60. -UA showing increased blood glucose over 500, dipstick hemoglobin positive, ketone 20, normal RBC, normal WBC and no bacteria. - Acute kidney injury secondary to postrenal AKI BPH from bladder outlet obstruction and prerenal acute kidney injury in the setting of HHS. - Foley catheter inserted.  More than 1 L drained.  Will discharge with Foley catheter.  Voiding trial in 2 weeks.  Outpatient urology follow-up. - Starting oral Flomax. -- Renal functions improving.  .    Fall at home -Patient presenting with complaining of fall at home 2 to 3 days ago.  Denies any hitting of the head and loss of consciousness.  Extensive imaging has been done which ruled out any acute fracture. - X-ray right sided shoulder degenerative change no fracture. -  CT head no acute intracranial abnormality. - CT cervical spine no fracture or dislocation. - CT chest, abdomen and pelvis no acute abnormal finding. -Continue fall precaution - Work with PT OT.  Refer to SNF.   Thyroid nodule -CT cervical spine showed incidental finding of thyroid nodule. -Radiology recommended nonemergent thyroid ultrasound which can be done outpatient setting. -  TSH 1.75.    Anemia of chronic disease -CBC showing stable H&H 12.9 and 37.  Continue to monitor.    GERD -Not currently taking any  Protonix.  Stable.   History of gastrointestinal stromal tumor -Last clinic visit in oncology clinic in April 2024. -Per chart review oncology note patient was noncompliant with Gleevec which has been resumed on 03/17/2023.  However patient is not taking it currently.  Also patient has been lost to follow-up with the oncology clinic afterward. -On discharge need to refer to oncology clinic to reestablish care. -CT scan of the liver was abnormal.  MRI of the liver done, shows possibly malignant lesion.  LFTs are normal.  I communicated this with his oncologist Dr. Truett Perna who will schedule follow-up.  Also reiterated to patient to keep up follow-up.  Explained to him about abnormal finding on liver MRI.    DVT prophylaxis: heparin injection 5,000 Units Start: 02/06/24 2200 SCDs Start: 02/06/24 2155 Place TED hose Start: 02/06/24 2155   Code Status: Full code Family Communication: None  Disposition Plan: Status is: Inpatient Remains inpatient appropriate because: Medically stable.  Transfer to skilled nursing facility when bed available.     Consultants:  None  Procedures:  None  Antimicrobials:  None     Objective: Vitals:   02/08/24 1935 02/08/24 2232 02/09/24 0816 02/09/24 1153  BP: 108/60 133/76 118/84 113/72  Pulse: 81 72 70 83  Resp: 18 18 20    Temp: 98.3 F (36.8 C) 98.5 F (36.9 C) 98.8 F (37.1 C) 98 F (36.7 C)  TempSrc: Oral Oral Oral Oral  SpO2: 95% 97% 100% 97%  Weight:      Height:        Intake/Output Summary (Last 24 hours) at 02/09/2024 1259 Last data filed at 02/09/2024 0818 Gross per 24 hour  Intake 600 ml  Output 2350 ml  Net -1750 ml   Filed Weights   02/06/24 1723 02/06/24 2251  Weight: 59.8 kg 53.1 kg    Examination:  General exam: Calm comfortable and interactive. Respiratory system: Clear to auscultation. Respiratory effort normal. Cardiovascular system: S1 & S2 heard, RRR.  Gastrointestinal system: Abdomen is nondistended, soft and  nontender. No organomegaly or masses felt. Normal bowel sounds heard. Foley catheter with clear urine. Central nervous system: Alert and oriented. No focal neurological deficits. Extremities: Symmetric 5 x 5 power.    Data Reviewed: I have personally reviewed following labs and imaging studies  CBC: Recent Labs  Lab 02/06/24 1729 02/08/24 0311  WBC 5.4 8.9  NEUTROABS  --  5.2  HGB 12.9* 10.6*  HCT 37.8* 30.2*  MCV 93.6 92.4  PLT 256 222   Basic Metabolic Panel: Recent Labs  Lab 02/06/24 1729 02/07/24 0033 02/07/24 0330 02/07/24 0959 02/08/24 0311  NA 122* 126* 129* 130* 130*  K 4.3 3.1* 2.8* 4.0 3.9  CL 82* 87* 89* 93* 97*  CO2 21* 24 29 21* 24  GLUCOSE 878* 734* 469* 390* 377*  BUN 34* 43* 42* 40* 41*  CREATININE 2.08* 1.96* 1.87* 1.71* 1.37*  CALCIUM 9.1 8.9 8.8* 9.2 8.9  MG  --   --   --  1.7 1.7  PHOS  --   --   --  2.9  --    GFR: Estimated Creatinine Clearance: 36.6 mL/min (A) (by C-G formula based on SCr of 1.37 mg/dL (H)). Liver Function Tests: Recent Labs  Lab 02/06/24 1729 02/08/24 0311  AST 23 19  ALT 20 15  ALKPHOS  153* 100  BILITOT 1.3* 0.7  PROT 7.8 6.3*  ALBUMIN 3.3* 2.6*   Recent Labs  Lab 02/06/24 1729  LIPASE 24   No results for input(s): "AMMONIA" in the last 168 hours. Coagulation Profile: No results for input(s): "INR", "PROTIME" in the last 168 hours. Cardiac Enzymes: No results for input(s): "CKTOTAL", "CKMB", "CKMBINDEX", "TROPONINI" in the last 168 hours. BNP (last 3 results) No results for input(s): "PROBNP" in the last 8760 hours. HbA1C: No results for input(s): "HGBA1C" in the last 72 hours. CBG: Recent Labs  Lab 02/09/24 0028 02/09/24 0507 02/09/24 0813 02/09/24 1153 02/09/24 1234  GLUCAP 130* 147* 156* 66* 120*   Lipid Profile: No results for input(s): "CHOL", "HDL", "LDLCALC", "TRIG", "CHOLHDL", "LDLDIRECT" in the last 72 hours. Thyroid Function Tests: Recent Labs    02/06/24 2011  TSH 1.753  FREET4  0.85   Anemia Panel: No results for input(s): "VITAMINB12", "FOLATE", "FERRITIN", "TIBC", "IRON", "RETICCTPCT" in the last 72 hours. Sepsis Labs: No results for input(s): "PROCALCITON", "LATICACIDVEN" in the last 168 hours.  No results found for this or any previous visit (from the past 240 hours).       Radiology Studies: MR LIVER W WO CONTRAST Result Date: 02/08/2024 CLINICAL DATA:  Characterize liver lesion incidentally identified by prior noncontrast CT EXAM: MRI ABDOMEN WITHOUT AND WITH CONTRAST TECHNIQUE: Multiplanar multisequence MR imaging of the abdomen was performed both before and after the administration of intravenous contrast. CONTRAST:  5mL GADAVIST GADOBUTROL 1 MMOL/ML IV SOLN COMPARISON:  CT chest abdomen pelvis, 02/06/2024 FINDINGS: Examination is generally somewhat limited by breath motion artifact. Lower chest: Cardiomegaly.  Trace pleural effusions. Hepatobiliary: Circumscribed heterogeneously T2 hyperintense lesion of the posterior liver dome, hepatic segment VII, measuring 2.1 x 1.4 cm (series 8, image 5). This appears to be hypoenhancing on multiphasic contrast enhanced sequences however by manual measurements does appear to have low-level internal contrast enhancement. There is an additional smaller lesion posteriorly measuring 0.6 cm with similar characteristics (series 8, image 10). Contracted gallbladder. No gallstones, gallbladder wall thickening, or biliary dilatation. Pancreas: Status post distal pancreatectomy. No pancreatic ductal dilatation or surrounding inflammatory changes. Spleen: Status post splenectomy. Adrenals/Urinary Tract: Adrenal glands are unremarkable. Multifocal renal cortical scarring of the right kidney. Kidneys are otherwise normal, without renal calculi, solid lesion, or hydronephrosis. Stomach/Bowel: Status post partial gastrectomy. No evidence of bowel wall thickening, distention, or inflammatory changes. Moderate burden of stool throughout the  colon. Vascular/Lymphatic: No significant vascular findings are present. No enlarged abdominal lymph nodes. Other: No abdominal wall hernia or abnormality. No ascites. Musculoskeletal: No acute or significant osseous findings. IMPRESSION: 1. Circumscribed hypoenhancing lesion of the posterior liver dome, hepatic segment VII, measuring 2.1 x 1.4 cm. Additional smaller lesion posteriorly in hepatic segment VI measuring 0.6 cm with similar characteristics. As previously reported, these are new when compared to more remote prior examinations and with no clearly benign imaging features are concerning for malignancy or metastases. 2. Status post distal pancreatectomy and splenectomy. 3. Cardiomegaly. 4. Trace pleural effusions. Electronically Signed   By: Jearld Lesch M.D.   On: 02/08/2024 16:52   ECHOCARDIOGRAM COMPLETE Result Date: 02/07/2024    ECHOCARDIOGRAM REPORT   Patient Name:   MATAIO MELE Date of Exam: 02/07/2024 Medical Rec #:  [id number]      Height:       62.0 in Accession #:    [id number]     Weight:       117.1 lb Date of Birth:  12/18/1950  BSA:          1.523 m Patient Age:    73 years       BP:           99/63 mmHg Patient Gender: M              HR:           79 bpm. Exam Location:  Inpatient Procedure: 2D Echo, Cardiac Doppler, Color Doppler and 3D Echo (Both Spectral            and Color Flow Doppler were utilized during procedure). Indications:    Hypertension Emergency  History:        Patient has no prior history of Echocardiogram examinations.                 Risk Factors:Hypertension, Diabetes and Current Smoker.  Sonographer:    Lucendia Herrlich RCS Referring Phys: [phone] SUBRINA SUNDIL IMPRESSIONS  1. Left ventricular ejection fraction, by estimation, is 60 to 65%. Left ventricular ejection fraction by 3D volume is 62 %. The left ventricle has normal function. The left ventricle has no regional wall motion abnormalities. Left ventricular diastolic  parameters are consistent with Grade I  diastolic dysfunction (impaired relaxation).  2. Right ventricular systolic function is normal. The right ventricular size is normal. Tricuspid regurgitation signal is inadequate for assessing PA pressure.  3. Left atrial size was mildly dilated.  4. The mitral valve is grossly normal. No evidence of mitral valve regurgitation.  5. The aortic valve is tricuspid. Aortic valve regurgitation is not visualized.  6. The inferior vena cava is normal in size with greater than 50% respiratory variability, suggesting right atrial pressure of 3 mmHg. Comparison(s): No prior Echocardiogram. FINDINGS  Left Ventricle: Left ventricular ejection fraction, by estimation, is 60 to 65%. Left ventricular ejection fraction by 3D volume is 62 %. The left ventricle has normal function. The left ventricle has no regional wall motion abnormalities. Strain imaging was not performed. The left ventricular internal cavity size was normal in size. There is no left ventricular hypertrophy. Left ventricular diastolic parameters are consistent with Grade I diastolic dysfunction (impaired relaxation). Indeterminate filling pressures. Right Ventricle: The right ventricular size is normal. No increase in right ventricular wall thickness. Right ventricular systolic function is normal. Tricuspid regurgitation signal is inadequate for assessing PA pressure. Left Atrium: Left atrial size was mildly dilated. Right Atrium: Right atrial size was normal in size. Pericardium: There is no evidence of pericardial effusion. Mitral Valve: The mitral valve is grossly normal. No evidence of mitral valve regurgitation. Tricuspid Valve: The tricuspid valve is normal in structure. Tricuspid valve regurgitation is not demonstrated. Aortic Valve: The aortic valve is tricuspid. Aortic valve regurgitation is not visualized. Aortic valve peak gradient measures 5.9 mmHg. Pulmonic Valve: The pulmonic valve was normal in structure. Pulmonic valve regurgitation is not  visualized. Aorta: The aortic root and ascending aorta are structurally normal, with no evidence of dilitation. Venous: The inferior vena cava is normal in size with greater than 50% respiratory variability, suggesting right atrial pressure of 3 mmHg. IAS/Shunts: No atrial level shunt detected by color flow Doppler. Additional Comments: 3D was performed not requiring image post processing on an independent workstation and was normal.  LEFT VENTRICLE PLAX 2D LVIDd:         4.30 cm         Diastology LVIDs:         2.80 cm  LV e' medial:    5.98 cm/s LV PW:         1.10 cm         LV E/e' medial:  7.6 LV IVS:        1.00 cm         LV e' lateral:   8.59 cm/s LVOT diam:     2.40 cm         LV E/e' lateral: 5.3 LV SV:         64 LV SV Index:   42 LVOT Area:     4.52 cm        3D Volume EF                                LV 3D EF:    Left                                             ventricul                                             ar                                             ejection                                             fraction                                             by 3D                                             volume is                                             62 %.                                 3D Volume EF:                                3D EF:        62 %                                LV EDV:       160 ml  LV ESV:       60 ml                                LV SV:        100 ml RIGHT VENTRICLE             IVC RV S prime:     20.30 cm/s  IVC diam: 1.20 cm TAPSE (M-mode): 2.0 cm LEFT ATRIUM           Index        RIGHT ATRIUM          Index LA diam:      3.00 cm 1.97 cm/m   RA Area:     9.77 cm LA Vol (A2C): 56.3 ml 36.98 ml/m  RA Volume:   14.80 ml 9.72 ml/m LA Vol (A4C): 35.9 ml 23.58 ml/m  AORTIC VALVE AV Area (Vmax): 3.16 cm AV Vmax:        121.00 cm/s AV Peak Grad:   5.9 mmHg LVOT Vmax:      84.40 cm/s LVOT Vmean:     54.500 cm/s LVOT VTI:        0.141 m  AORTA Ao Root diam: 3.90 cm Ao Asc diam:  3.30 cm MITRAL VALVE MV Area (PHT): 3.65 cm    SHUNTS MV Decel Time: 208 msec    Systemic VTI:  0.14 m MV E velocity: 45.70 cm/s  Systemic Diam: 2.40 cm MV A velocity: 65.10 cm/s MV E/A ratio:  0.70 Zoila Shutter MD Electronically signed by Zoila Shutter MD Signature Date/Time: 02/07/2024/3:35:15 PM    Final         Scheduled Meds:  amLODipine  5 mg Oral Daily   Chlorhexidine Gluconate Cloth  6 each Topical Q0600   heparin  5,000 Units Subcutaneous Q8H   insulin aspart  0-15 Units Subcutaneous TID WC   insulin aspart  0-5 Units Subcutaneous QHS   insulin aspart  6 Units Subcutaneous TID WC   insulin glargine  30 Units Subcutaneous Daily   sodium chloride flush  3 mL Intravenous Q12H   sodium chloride flush  3 mL Intravenous Q12H   tamsulosin  0.8 mg Oral QPC supper   Continuous Infusions:     LOS: 3 days    Time spent: 40 minutes    Dorcas Carrow, MD [facility]

## 2024-02-09 NOTE — Inpatient Diabetes Management (Addendum)
 Inpatient Diabetes Program Recommendations  AACE/ADA: New Consensus Statement on Inpatient Glycemic Control (2015)  Target Ranges:  Prepandial:   less than 140 mg/dL      Peak postprandial:   less than 180 mg/dL (1-2 hours)      Critically ill patients:  140 - 180 mg/dL   Lab Results  Component Value Date   GLUCAP 120 (H) 02/09/2024   HGBA1C >15.5 (H) 02/07/2024    Latest Reference Range & Units 02/09/24 05:07 02/09/24 08:13 02/09/24 11:53 02/09/24 12:34  Glucose-Capillary 70 - 99 mg/dL 956 (H) 213 (H) Novolog 9 units 66 (L) 120 (H)  (H): Data is abnormally high (L): Data is abnormally low  Diabetes history: DM2 Outpatient Diabetes medications: Lantus 20 units daily, Metformin 500 mg bid (? taking Lantus or Metformin per chart notes) Current orders for Inpatient glycemic control: Lantus 30 units daily, Novolog 6 units tid meal coverage, Novolog 0-15 units tid, 0-5 units hs  Inpatient Diabetes Program Recommendations:   Patient had hypoglycemia post correction + meal coverage this am. Please continue: -Decrease Novolog correction to 0-9 units tid, 0-5 units hs -Decrease Novolog meal coverage to 3 units tid if eats 50% meals Spoke with pt about  A1C results > 15.5 (average blood glucose >398)  using video  Spanish interpretor [name] # [phone] and explained what an A1C is, basic pathophysiology of DM Type 2, basic home care, basic diabetes diet nutrition principles, importance of checking CBGs and maintaining good CBG control to prevent long-term and short-term complications. Reviewed signs and symptoms of hyperglycemia and hypoglycemia and how to treat hypoglycemia at home. Also reviewed blood sugar goals at home.  RNs to provide ongoing basic DM education at bedside with this patient. Have ordered educational booklet in Spanish.   Thank you, Kevin [name]. [name], RN, MSN, CDCES  Diabetes Coordinator Inpatient Glycemic Control Team Team Pager [phone] (8am-5pm) 02/09/2024 1:49  PM

## 2024-02-09 NOTE — Progress Notes (Signed)
 Patient CBG 66.   240 ml juice given.  Rechecked CBG 120.

## 2024-02-09 NOTE — Progress Notes (Signed)
 Mobility Specialist Progress Note:   02/09/24 1000  Mobility  Activity Ambulated with assistance in room;Ambulated with assistance in hallway  Level of Assistance Contact guard assist, steadying assist  Assistive Device None  Distance Ambulated (ft) 300 ft  Activity Response Tolerated well  Mobility Referral Yes  Mobility visit 1 Mobility  Mobility Specialist Start Time (ACUTE ONLY) 1030  Mobility Specialist Stop Time (ACUTE ONLY) 1042  Mobility Specialist Time Calculation (min) (ACUTE ONLY) 12 min   Pt received in bed, agreeable to ambulate in hallway. Required CGA with gait belt for safety, pt was slightly unsteady. Tolerated well, c/o hunger. Returned pt to room, lying comfortably in bed with all needs met.   Feliciana Rossetti Mobility Specialist Please contact via Special educational needs teacher or  Rehab office at [phone]

## 2024-02-10 DIAGNOSIS — E1100 Type 2 diabetes mellitus with hyperosmolarity without nonketotic hyperglycemic-hyperosmolar coma (NKHHC): Secondary | ICD-10-CM | POA: Diagnosis not present

## 2024-02-10 LAB — COMPREHENSIVE METABOLIC PANEL
ALT: 16 U/L (ref 0–44)
AST: 28 U/L (ref 15–41)
Albumin: 2.2 g/dL — ABNORMAL LOW (ref 3.5–5.0)
Alkaline Phosphatase: 106 U/L (ref 38–126)
Anion gap: 6 (ref 5–15)
BUN: 36 mg/dL — ABNORMAL HIGH (ref 8–23)
CO2: 27 mmol/L (ref 22–32)
Calcium: 8.1 mg/dL — ABNORMAL LOW (ref 8.9–10.3)
Chloride: 102 mmol/L (ref 98–111)
Creatinine, Ser: 1.25 mg/dL — ABNORMAL HIGH (ref 0.61–1.24)
GFR, Estimated: >60 mL/min (ref >60)
Glucose, Bld: 311 mg/dL — ABNORMAL HIGH (ref 70–99)
Potassium: 3.5 mmol/L (ref 3.5–5.1)
Sodium: 135 mmol/L (ref 135–145)
Total Bilirubin: 0.3 mg/dL (ref 0.0–1.2)
Total Protein: 5.8 g/dL — ABNORMAL LOW (ref 6.5–8.1)

## 2024-02-10 LAB — GLUCOSE, CAPILLARY
Glucose-Capillary: 184 mg/dL — ABNORMAL HIGH (ref 70–99)
Glucose-Capillary: 323 mg/dL — ABNORMAL HIGH (ref 70–99)
Glucose-Capillary: 260 mg/dL — ABNORMAL HIGH (ref 70–99)
Glucose-Capillary: 60 mg/dL — ABNORMAL LOW (ref 70–99)
Glucose-Capillary: 221 mg/dL — ABNORMAL HIGH (ref 70–99)
Glucose-Capillary: 120 mg/dL — ABNORMAL HIGH (ref 70–99)

## 2024-02-10 LAB — CBC WITH DIFFERENTIAL/PLATELET
Abs Immature Granulocytes: 0.04 10*3/uL (ref 0.00–0.07)
Basophils Absolute: 0 10*3/uL (ref 0.0–0.1)
Basophils Relative: 0 %
Eosinophils Absolute: 0.3 10*3/uL (ref 0.0–0.5)
Eosinophils Relative: 3 %
HCT: 29.4 % — ABNORMAL LOW (ref 39.0–52.0)
Hemoglobin: 10 g/dL — ABNORMAL LOW (ref 13.0–17.0)
Immature Granulocytes: 1 %
Lymphocytes Relative: 34 %
Lymphs Abs: 2.6 10*3/uL (ref 0.7–4.0)
MCH: 32.2 pg (ref 26.0–34.0)
MCHC: 34 g/dL (ref 30.0–36.0)
MCV: 94.5 fL (ref 80.0–100.0)
Monocytes Absolute: 1.1 10*3/uL — ABNORMAL HIGH (ref 0.1–1.0)
Monocytes Relative: 14 %
Neutro Abs: 3.6 10*3/uL (ref 1.7–7.7)
Neutrophils Relative %: 48 %
Platelets: 240 10*3/uL (ref 150–400)
RBC: 3.11 MIL/uL — ABNORMAL LOW (ref 4.22–5.81)
RDW: 14.2 % (ref 11.5–15.5)
WBC: 7.5 10*3/uL (ref 4.0–10.5)
nRBC: 0 % (ref 0.0–0.2)

## 2024-02-10 LAB — MAGNESIUM: Magnesium: 1.7 mg/dL (ref 1.7–2.4)

## 2024-02-10 LAB — PHOSPHORUS: Phosphorus: 3.6 mg/dL (ref 2.5–4.6)

## 2024-02-10 MED ORDER — INSULIN ASPART 100 UNIT/ML IJ SOLN
8.0000 [IU] | Freq: Once | INTRAMUSCULAR | Status: AC
  Administered 2024-02-10: 8 [IU] via SUBCUTANEOUS

## 2024-02-10 NOTE — TOC Progression Note (Signed)
 Transition of Care [facility]) - Progression Note    Patient Details  Name: Kevin [name] MRN: [id number] Date of Birth: 1951-04-30  [facility] CM/SW Contact  Eduard Roux, [place] Phone Number: 02/10/2024, 8:57 AM  Clinical Narrative:     Faxed out SNF referrals for bed offers  TOC will provide bed offers once available  Antony Blackbird, MSW, LCSW Clinical Social Worker    Expected Discharge Plan: Skilled Nursing Facility Barriers to Discharge: Continued Medical Work up  Expected Discharge Plan and Services       Living arrangements for the past 2 months: Hotel/Motel                                       Social Determinants of Health (SDOH) Interventions SDOH Screenings   Food Insecurity: No Food Insecurity (02/06/2024)  Housing: Low Risk  (02/06/2024)  Transportation Needs: No Transportation Needs (02/06/2024)  Utilities: Not At Risk (02/06/2024)  Depression (PHQ2-9): Low Risk  (08/16/2021)  Social Connections: Moderately Integrated (02/06/2024)  Tobacco Use: Medium Risk (02/06/2024)    Readmission Risk Interventions     No data to display

## 2024-02-10 NOTE — Plan of Care (Signed)
  Problem: Clinical Measurements: Goal: Will remain free from infection Outcome: Progressing   Problem: Health Behavior/Discharge Planning: Goal: Ability to manage health-related needs will improve Outcome: Not Progressing   

## 2024-02-10 NOTE — Progress Notes (Addendum)
  Patient CBG 60.    240 ml juice given.  Will recheck.

## 2024-02-10 NOTE — Plan of Care (Signed)
  Problem: Activity: Goal: Risk for activity intolerance will decrease Outcome: Progressing   Problem: Health Behavior/Discharge Planning: Goal: Ability to manage health-related needs will improve Outcome: Not Progressing

## 2024-02-10 NOTE — Progress Notes (Signed)
 Patients family member attempted to bring in outside fast food in for the patient this evening. I educated patient and family on diabetes and the importance of limiting carbohydrates in the diet. Interpreter used.   Kenard Gower, RN

## 2024-02-10 NOTE — Progress Notes (Signed)
 Patient's HS CBG was 443. Notified on call Sundil and modification was made with the sliding scale.  Patient had wrappers from [employer] when his CBG was checked. Patient and family need to be educated if not again the importance of maintaining a healthy glucose level.

## 2024-02-10 NOTE — Progress Notes (Signed)
 PROGRESS NOTE    Kevin [name]  GNF:[id number] DOB: 24-Jan-1951 DOA: 02/06/2024 PCP: Marcine Matar, MD    Brief Narrative:  73 year old with history of insulin-dependent type 2 diabetes, history of gastrointestinal internal stromal tumor, essential hypertension, chronic hypokalemia, chronic current urgency, GERD and history of noncompliance presents to the ER with unwitnessed fall 2 days ago, voiding on himself, right arm pain.  In the emergency room blood pressure 203/95.  On room air.  Blood glucose more than 600, osmolality 326.  Beta hydroxybutyrate 2.99.  Sodium 122 and blood glucose 900 on serology.  Creatinine 2.02.  He was dribbling urine in the ER.  Declined to have catheter placed.  Skeletal survey negative for acute fractures.  Patient apparently was not taking any medications unknown how long.  Subjective:  Patient seen and examined.  Denies any complaints today.  He had low blood sugars yesterday afternoon, then his family brought of big fast food packet and his blood sugars were 300 and more. Discussed in detail about consistency of intake, only eating prescribed food and patient is agreeable.  Assessment & Plan:   Hyperosmolar hyperglycemic state Hyponatremia secondary to hyperglycemia History of insulin-dependent DM type II with insulin noncompliance.  Dietary noncompliance. History of recurrent hyperglycemic crisis in the past.  Medication and dietary noncompliance. > Patient presented to emergency department complaining of fall 2 days ago at home and since then he is having right-sided shoulder spasm.  Patient is not taking neither insulin, home blood pressure regimen in fact not taking any home medications at all. -Previous A1c above 15 last checked in April 2024. --Presented with blood sugars more than 600.  Treated with IV insulin infusion overnight.  Anion gap closed.  Electrolytes corrected. -Keep on long-acting insulin 30, prandial insulin 6 units 3 times daily.    A1c is more than 15. Educated about dietary changes, consistent food intake.   Hypertensive emergency History essential hypertension-medication noncompliance -On presentation to ED elevated blood pressure 203/95.  After giving labetalol blood pressure has been improved 154/98. -Not taking medications at home.  Started on amlodipine.  Fairly stable today.  Keep on amlodipine.   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 - CT scan abdomen pelvis showed prominent bladder distention with bladder ultralong hydronephrosis and hydroureter and enlarged prostate associated with bladder outlet obstruction. -Creatinine 2.08.  Baseline creatinine within normal range GFR above 60. -UA showing increased blood glucose over 500, dipstick hemoglobin positive, ketone 20, normal RBC, normal WBC and no bacteria. - Acute kidney injury secondary to postrenal AKI BPH from bladder outlet obstruction and prerenal acute kidney injury in the setting of HHS. - Foley catheter inserted.  More than 1 L drained.  Will discharge with Foley catheter.  Voiding trial in 2 weeks.  Outpatient urology follow-up. - Starting oral Flomax. -- Renal functions improving.  .    Fall at home -Patient presenting with complaining of fall at home 2 to 3 days ago.  Denies any hitting of the head and loss of consciousness.  Extensive imaging has been done which ruled out any acute fracture. - X-ray right sided shoulder degenerative change no fracture. -  CT head no acute intracranial abnormality. - CT cervical spine no fracture or dislocation. - CT chest, abdomen and pelvis no acute abnormal finding. -Continue fall precaution - Work with PT OT.  Refer to SNF.  Thyroid nodule -CT cervical spine showed incidental finding of thyroid nodule. -Radiology recommended nonemergent thyroid  ultrasound which can be done outpatient setting. - TSH 1.75.    Anemia of chronic disease -CBC showing stable H&H 12.9 and 37.  Continue to monitor.    GERD -Not currently taking any Protonix.  Stable.   History of gastrointestinal stromal tumor -Last clinic visit in oncology clinic in April 2024. -Per chart review oncology note patient was noncompliant with Gleevec which has been resumed on 03/17/2023.  However patient is not taking it currently.  Also patient has been lost to follow-up with the oncology clinic afterward. -On discharge need to refer to oncology clinic to reestablish care. -CT scan of the liver was abnormal.  MRI of the liver done, shows possibly malignant lesion.  LFTs are normal.  I communicated this with his oncologist Dr. Truett Perna who will schedule follow-up.  Also reiterated to patient to keep up follow-up.  Explained to him about abnormal finding on liver MRI.    DVT prophylaxis: heparin injection 5,000 Units Start: 02/06/24 2200 SCDs Start: 02/06/24 2155 Place TED hose Start: 02/06/24 2155   Code Status: Full code Family Communication: None  Disposition Plan: Status is: Inpatient Remains inpatient appropriate because: Medically stable.  Transfer to skilled nursing facility when bed available.     Consultants:  None  Procedures:  None  Antimicrobials:  None     Objective: Vitals:   02/09/24 1946 02/09/24 2311 02/10/24 0437 02/10/24 0845  BP: 137/83 115/72 (!) 103/53 134/74  Pulse: 88 88 83 84  Resp: 20 20 20 20   Temp: 98.6 F (37 C) 98 F (36.7 C) 98.5 F (36.9 C) 98.1 F (36.7 C)  TempSrc: Oral Oral Oral Oral  SpO2: 99% 92% 96%   Weight:      Height:        Intake/Output Summary (Last 24 hours) at 02/10/2024 0904 Last data filed at 02/10/2024 0857 Gross per 24 hour  Intake 480 ml  Output 2450 ml  Net -1970 ml   Filed Weights   02/06/24 1723 02/06/24 2251  Weight: 59.8 kg 53.1 kg    Examination:  General exam: Calm comfortable  and interactive. Respiratory system: Clear to auscultation. Respiratory effort normal. Cardiovascular system: S1 & S2 heard, RRR.  Gastrointestinal system: Abdomen is nondistended, soft and nontender. No organomegaly or masses felt. Normal bowel sounds heard. Foley catheter with clear urine. Central nervous system: Alert and oriented. No focal neurological deficits. Extremities: Symmetric 5 x 5 power.    Data Reviewed: I have personally reviewed following labs and imaging studies  CBC: Recent Labs  Lab 02/06/24 1729 02/08/24 0311 02/10/24 0502  WBC 5.4 8.9 7.5  NEUTROABS  --  5.2 3.6  HGB 12.9* 10.6* 10.0*  HCT 37.8* 30.2* 29.4*  MCV 93.6 92.4 94.5  PLT 256 222 240   Basic Metabolic Panel: Recent Labs  Lab 02/07/24 0033 02/07/24 0330 02/07/24 0959 02/08/24 0311 02/10/24 0502  NA 126* 129* 130* 130* 135  K 3.1* 2.8* 4.0 3.9 3.5  CL 87* 89* 93* 97* 102  CO2 24 29 21* 24 27  GLUCOSE 734* 469* 390* 377* 311*  BUN 43* 42* 40* 41* 36*  CREATININE 1.96* 1.87* 1.71* 1.37* 1.25*  CALCIUM 8.9 8.8* 9.2 8.9 8.1*  MG  --   --  1.7 1.7 1.7  PHOS  --   --  2.9  --  3.6   GFR: Estimated Creatinine Clearance: 40.1 mL/min (A) (by C-G formula  based on SCr of 1.25 mg/dL (H)). Liver Function Tests: Recent Labs  Lab 02/06/24 1729 02/08/24 0311 02/10/24 0502  AST 23 19 28   ALT 20 15 16   ALKPHOS 153* 100 106  BILITOT 1.3* 0.7 0.3  PROT 7.8 6.3* 5.8*  ALBUMIN 3.3* 2.6* 2.2*   Recent Labs  Lab 02/06/24 1729  LIPASE 24   No results for input(s): "AMMONIA" in the last 168 hours. Coagulation Profile: No results for input(s): "INR", "PROTIME" in the last 168 hours. Cardiac Enzymes: No results for input(s): "CKTOTAL", "CKMB", "CKMBINDEX", "TROPONINI" in the last 168 hours. BNP (last 3 results) No results for input(s): "PROBNP" in the last 8760 hours. HbA1C: No results for input(s): "HGBA1C" in the last 72 hours. CBG: Recent Labs  Lab 02/09/24 1234 02/09/24 1630  02/09/24 2151 02/10/24 0113 02/10/24 0606  GLUCAP 120* 240* 443* 184* 323*   Lipid Profile: No results for input(s): "CHOL", "HDL", "LDLCALC", "TRIG", "CHOLHDL", "LDLDIRECT" in the last 72 hours. Thyroid Function Tests: No results for input(s): "TSH", "T4TOTAL", "FREET4", "T3FREE", "THYROIDAB" in the last 72 hours.  Anemia Panel: No results for input(s): "VITAMINB12", "FOLATE", "FERRITIN", "TIBC", "IRON", "RETICCTPCT" in the last 72 hours. Sepsis Labs: No results for input(s): "PROCALCITON", "LATICACIDVEN" in the last 168 hours.  No results found for this or any previous visit (from the past 240 hours).       Radiology Studies: MR LIVER W WO CONTRAST Result Date: 02/08/2024 CLINICAL DATA:  Characterize liver lesion incidentally identified by prior noncontrast CT EXAM: MRI ABDOMEN WITHOUT AND WITH CONTRAST TECHNIQUE: Multiplanar multisequence MR imaging of the abdomen was performed both before and after the administration of intravenous contrast. CONTRAST:  5mL GADAVIST GADOBUTROL 1 MMOL/ML IV SOLN COMPARISON:  CT chest abdomen pelvis, 02/06/2024 FINDINGS: Examination is generally somewhat limited by breath motion artifact. Lower chest: Cardiomegaly.  Trace pleural effusions. Hepatobiliary: Circumscribed heterogeneously T2 hyperintense lesion of the posterior liver dome, hepatic segment VII, measuring 2.1 x 1.4 cm (series 8, image 5). This appears to be hypoenhancing on multiphasic contrast enhanced sequences however by manual measurements does appear to have low-level internal contrast enhancement. There is an additional smaller lesion posteriorly measuring 0.6 cm with similar characteristics (series 8, image 10). Contracted gallbladder. No gallstones, gallbladder wall thickening, or biliary dilatation. Pancreas: Status post distal pancreatectomy. No pancreatic ductal dilatation or surrounding inflammatory changes. Spleen: Status post splenectomy. Adrenals/Urinary Tract: Adrenal glands are  unremarkable. Multifocal renal cortical scarring of the right kidney. Kidneys are otherwise normal, without renal calculi, solid lesion, or hydronephrosis. Stomach/Bowel: Status post partial gastrectomy. No evidence of bowel wall thickening, distention, or inflammatory changes. Moderate burden of stool throughout the colon. Vascular/Lymphatic: No significant vascular findings are present. No enlarged abdominal lymph nodes. Other: No abdominal wall hernia or abnormality. No ascites. Musculoskeletal: No acute or significant osseous findings. IMPRESSION: 1. Circumscribed hypoenhancing lesion of the posterior liver dome, hepatic segment VII, measuring 2.1 x 1.4 cm. Additional smaller lesion posteriorly in hepatic segment VI measuring 0.6 cm with similar characteristics. As previously reported, these are new when compared to more remote prior examinations and with no clearly benign imaging features are concerning for malignancy or metastases. 2. Status post distal pancreatectomy and splenectomy. 3. Cardiomegaly. 4. Trace pleural effusions. Electronically Signed   By: Jearld Lesch M.D.   On: 02/08/2024 16:52        Scheduled Meds:  amLODipine  5 mg Oral Daily   Chlorhexidine Gluconate Cloth  6 each Topical Q0600   heparin  5,000 Units  Subcutaneous Q8H   insulin aspart  0-15 Units Subcutaneous TID PC & HS   insulin aspart  6 Units Subcutaneous TID WC   insulin glargine  30 Units Subcutaneous Daily   sodium chloride flush  3 mL Intravenous Q12H   sodium chloride flush  3 mL Intravenous Q12H   tamsulosin  0.8 mg Oral QPC supper   Continuous Infusions:     LOS: 4 days    Time spent: 40 minutes    Dorcas Carrow, MD [facility]

## 2024-02-10 NOTE — Inpatient Diabetes Management (Signed)
 Inpatient Diabetes Program Recommendations  AACE/ADA: New Consensus Statement on Inpatient Glycemic Control (2015)  Target Ranges:  Prepandial:   less than 140 mg/dL      Peak postprandial:   less than 180 mg/dL (1-2 hours)      Critically ill patients:  140 - 180 mg/dL   Lab Results  Component Value Date   GLUCAP 260 (H) 02/10/2024   HGBA1C >15.5 (H) 02/07/2024    Latest Reference Range & Units 02/09/24 08:13 02/09/24 11:53 02/09/24 12:34 02/09/24 16:30 02/09/24 21:51 02/10/24 01:13 02/10/24 06:06 02/10/24 09:04  Glucose-Capillary 70 - 99 mg/dL 161 (H) Novolog 9 units 66 (L) 120 (H) No meal coverage 240 (H) Novolog 2 units No Meal coverage 443 (H) Novolog 15 units 184 (H) 323 (H) Novolog 8 units One time order 260 (H) Novolog 14 units  (H): Data is abnormally high (L): Data is abnormally low  Diabetes history: DM2 Outpatient Diabetes medications: Lantus 20 units daily, Metformin 500 mg bid (? taking Lantus or Metformin per chart notes) Current orders for Inpatient glycemic control: Lantus 30 units daily, Novolog 6 units tid meal coverage, Novolog 0-15 units tid, 0-5 units hs  Inpatient Diabetes Program Recommendations:   Patient did not receive Novolog meal coverage for lunch or dinner yesterday due to not meeting criteria, but patient's CBGs elevated afterward to 443. Please consider: -Novolog correction to 0-9 units q 4 hrs.  Thank you, Kevin [name]. Marin Wisner, RN, MSN, CDCES  Diabetes Coordinator Inpatient Glycemic Control Team Team Pager [phone] (8am-5pm) 02/10/2024 10:50 AM

## 2024-02-11 DIAGNOSIS — E1100 Type 2 diabetes mellitus with hyperosmolarity without nonketotic hyperglycemic-hyperosmolar coma (NKHHC): Secondary | ICD-10-CM | POA: Diagnosis not present

## 2024-02-11 LAB — GLUCOSE, CAPILLARY
Glucose-Capillary: 248 mg/dL — ABNORMAL HIGH (ref 70–99)
Glucose-Capillary: 196 mg/dL — ABNORMAL HIGH (ref 70–99)
Glucose-Capillary: 281 mg/dL — ABNORMAL HIGH (ref 70–99)
Glucose-Capillary: 219 mg/dL — ABNORMAL HIGH (ref 70–99)
Glucose-Capillary: 189 mg/dL — ABNORMAL HIGH (ref 70–99)
Glucose-Capillary: 121 mg/dL — ABNORMAL HIGH (ref 70–99)

## 2024-02-11 NOTE — Progress Notes (Signed)
 PROGRESS NOTE    Kevin [name]  ZOX:[id number] DOB: 03/30/1951 DOA: 02/06/2024 PCP: Marcine Matar, MD   Brief Narrative:   73 year old with history of insulin-dependent type 2 diabetes, history of gastrointestinal internal stromal tumor, essential hypertension, chronic hypokalemia, GERD and history of noncompliance presented with unwitnessed fall, right arm pain and voiding on himself.  On presentation, he was hypertensive.  Blood glucose was more than 600, osmolality of 326, beta hydroxybutyrate of 2.99, sodium of 122, creatinine of 2.02.  Skeletal survey was negative for acute fractures.  He was started on IV insulin and IV fluids and subsequently switched to long-acting insulin.  He required Foley catheter placement.  Kidney function improving.  PT recommended SNF placement.  Currently medically stable for discharge to SNF.  Assessment & Plan:   Hyperosmolar hyperglycemic state in a patient with diabetes mellitus type 2 with dietary and insulin noncompliance Hyponatremia secondary to hyperglycemia Hypoglycemia -Presented after a fall at home with extremely elevated blood sugars as above. -Initially required IV insulin and fluids.  Subsequently transitioned to long-acting insulin along with short acting insulin with meals and CBGs with SSI. -Has had intermittent episodes of hypoglycemia but mostly blood sugars are on the higher side.  Diabetes coronary following.  Continue carb modified diet. -A1c 15. -Patient needs to be compliant with diet and medications  Hypertensive emergency -Blood pressure currently stable.  Continue amlodipine.  Patient needs to be compliant with medications  AKI on CKD stage II Acute urinary retention from bladder outlet obstruction secondary to BPH Bilateral hydronephrosis secondary to BPH -Creatinine 1.25 on 02/10/2024.  No labs today. -Currently has a Foley catheter which will be continued on discharge.  Will need outpatient urology evaluation and  follow-up.  Continue Flomax  Fall at home -Imaging on presentation showed no fractures or acute abnormal findings.  Fall precautions. -PT/OT recommending SNF.  TOC following.  Thyroid nodule -Incidental finding on CT cervical spine.  Will need nonemergent thyroid ultrasound as an outpatient  Anemia of chronic disease -From chronic illnesses.  Hemoglobin stable.  Monitor intermittently  Hypokalemia -Resolved  GERD -Not currently taking Protonix.  Stable  History of GIST -Last clinic visit in oncology clinic in April 2024. -Per chart review oncology note patient was noncompliant with Gleevec which has been resumed on 03/17/2023.  However patient is not taking it currently.  Also patient has been lost to follow-up with the oncology clinic afterward. -On discharge need to refer to oncology clinic to reestablish care. -CT scan of the liver was abnormal.  MRI of the liver done, shows possibly malignant lesion.  LFTs are normal.  Previous hospitalist communicated this with his oncologist Dr. Truett Perna who will schedule follow-up.  Also reiterated to patient to keep up follow-up.  Previous hospitalist explained to him about abnormal finding on liver MRI.  DVT prophylaxis: Heparin subcutaneous Code Status: Full Family Communication: None at bedside Disposition Plan: Status is: Inpatient Remains inpatient appropriate because: Of severity of illness.  Need for SNF placement.  Consultants: None  Procedures: None  Antimicrobials: None   Subjective: Patient seen and examined at bedside.  Poor historian.  No fever, vomiting, seizures or agitation reported.  Objective: Vitals:   02/10/24 2316 02/11/24 0331 02/11/24 0833 02/11/24 0842  BP: 112/68 106/67 112/75 112/75  Pulse: 74 67 71   Resp: 16 18 20    Temp: 98.1 F (36.7 C) 97.7 F (36.5 C) 98.5 F (36.9 C)   TempSrc: Oral Oral Oral   SpO2:  95%  Weight:      Height:        Intake/Output Summary (Last 24 hours) at 02/11/2024  1033 Last data filed at 02/11/2024 0334 Gross per 24 hour  Intake --  Output 2550 ml  Net -2550 ml   Filed Weights   02/06/24 1723 02/06/24 2251  Weight: 59.8 kg 53.1 kg    Examination:  General exam: Appears calm and comfortable. Respiratory system: Bilateral decreased breath sounds at bases Cardiovascular system: S1 & S2 heard, Rate controlled Gastrointestinal system: Abdomen is nondistended, soft and nontender. Normal bowel sounds heard. Extremities: No cyanosis, clubbing, edema  Central nervous system: Alert and oriented. No focal neurological deficits. Moving extremities Skin: No rashes, lesions or ulcers Psychiatry: Flat affect.  Not agitated.   Data Reviewed: I have personally reviewed following labs and imaging studies  CBC: Recent Labs  Lab 02/06/24 1729 02/08/24 0311 02/10/24 0502  WBC 5.4 8.9 7.5  NEUTROABS  --  5.2 3.6  HGB 12.9* 10.6* 10.0*  HCT 37.8* 30.2* 29.4*  MCV 93.6 92.4 94.5  PLT 256 222 240   Basic Metabolic Panel: Recent Labs  Lab 02/07/24 0033 02/07/24 0330 02/07/24 0959 02/08/24 0311 02/10/24 0502  NA 126* 129* 130* 130* 135  K 3.1* 2.8* 4.0 3.9 3.5  CL 87* 89* 93* 97* 102  CO2 24 29 21* 24 27  GLUCOSE 734* 469* 390* 377* 311*  BUN 43* 42* 40* 41* 36*  CREATININE 1.96* 1.87* 1.71* 1.37* 1.25*  CALCIUM 8.9 8.8* 9.2 8.9 8.1*  MG  --   --  1.7 1.7 1.7  PHOS  --   --  2.9  --  3.6   GFR: Estimated Creatinine Clearance: 40.1 mL/min (A) (by C-G formula based on SCr of 1.25 mg/dL (H)). Liver Function Tests: Recent Labs  Lab 02/06/24 1729 02/08/24 0311 02/10/24 0502  AST 23 19 28   ALT 20 15 16   ALKPHOS 153* 100 106  BILITOT 1.3* 0.7 0.3  PROT 7.8 6.3* 5.8*  ALBUMIN 3.3* 2.6* 2.2*   Recent Labs  Lab 02/06/24 1729  LIPASE 24   No results for input(s): "AMMONIA" in the last 168 hours. Coagulation Profile: No results for input(s): "INR", "PROTIME" in the last 168 hours. Cardiac Enzymes: No results for input(s): "CKTOTAL",  "CKMB", "CKMBINDEX", "TROPONINI" in the last 168 hours. BNP (last 3 results) No results for input(s): "PROBNP" in the last 8760 hours. HbA1C: No results for input(s): "HGBA1C" in the last 72 hours. CBG: Recent Labs  Lab 02/10/24 1147 02/10/24 1622 02/10/24 2109 02/11/24 0612 02/11/24 0833  GLUCAP 60* 221* 120* 248* 196*   Lipid Profile: No results for input(s): "CHOL", "HDL", "LDLCALC", "TRIG", "CHOLHDL", "LDLDIRECT" in the last 72 hours. Thyroid Function Tests: No results for input(s): "TSH", "T4TOTAL", "FREET4", "T3FREE", "THYROIDAB" in the last 72 hours. Anemia Panel: No results for input(s): "VITAMINB12", "FOLATE", "FERRITIN", "TIBC", "IRON", "RETICCTPCT" in the last 72 hours. Sepsis Labs: No results for input(s): "PROCALCITON", "LATICACIDVEN" in the last 168 hours.  No results found for this or any previous visit (from the past 240 hours).       Radiology Studies: No results found.      Scheduled Meds:  amLODipine  5 mg Oral Daily   Chlorhexidine Gluconate Cloth  6 each Topical Q0600   heparin  5,000 Units Subcutaneous Q8H   insulin aspart  0-15 Units Subcutaneous TID PC & HS   insulin aspart  6 Units Subcutaneous TID WC   insulin glargine  30 Units Subcutaneous  Daily   sodium chloride flush  3 mL Intravenous Q12H   sodium chloride flush  3 mL Intravenous Q12H   tamsulosin  0.8 mg Oral QPC supper   Continuous Infusions:        Glade Lloyd, MD [facility] 02/11/2024, 10:33 AM

## 2024-02-11 NOTE — TOC Progression Note (Signed)
 Transition of Care [facility]) - Progression Note    Patient Details  Name: Kevin [name] MRN: [id number] Date of Birth: [birth year]  Transition of Care [facility]) CM/SW Contact  Mearl Latin, LCSW Phone Number: 02/11/2024, 5:00 PM  Clinical Narrative:    CSW spoke with patient utilizing Spanish interpretor. CSW provided SNF bed offers and Medicare ratings list. Patient stated he would like to talk to his daughter about it before deciding. CSW will follow up with daughter.   Skilled Nursing Rehab Facilities-   [email or web address]   Ratings out of 5 stars (5 the highest)   Name Address  Phone # Quality Care Staffing Health Inspection Overall  [facility] [address] [phone] 3 3 4 4   [facility] [address] [place] [phone] 5 1 4 4   [facility] [address], [facility] [phone] 2 2 2 2   [facility] [address], [place] [phone] 5 2 4 5   [facility]  [address], [place] [phone] 4 3 5 5   [facility] [address] [facility] [phone] 4 2 2 2   [facility] [address], [place] [phone] 5 1 2 2   [facility] [address], [place] [phone] 2 1 3 2   [facility] ([facility]) [address] [place] [phone] 2 3 3 3   [facility] [address] [place], [place] [phone] 3 3 2 2   [facility] ([place]) [address], [place] [phone] 3 1 1 1   Eligha Bridegroom [address] [place] [phone] 2 3 4 4   [facility] [address], [place] [phone] 4 4 3 3   [address] ([place]) [address], [place] [phone] 1 2 4 3           [facility] [address], [place] [phone] 2 1 4 3   [facility] [address], [place] [phone] 4 2 1 1   [facility]  [address] [place], [place] [address] [phone] 2 2 2 2    [facility] [address] [phone] 3 2 4 4   [facility] [address], [place] [phone] 2 1 2 1   [facility] (No UHC) [address], [place] [phone] 5 4 5 5   [facility] [address], [place] [phone] 3 4 2 2   [facility] [address], [place] [phone] 2 1 1 1   [facility] [address] [place] [phone] 4 2 5 5   [facility] [address] [place] [phone] 4 1 1 1   [facility] [address] [place], [place] [phone] 2 2 3 3           [facility]  [phone] 3 1 1 1   [facility] [address], [place]lyn Clines  [phone] 3 3 3 3   [facility] (No Hum[employer]) [address], [place] [phone] 2 2 4 4   [facility]) [address], [place]an [phone] 2 1 1 1   [facility] [address] [place] [phone] 4 3 5 5   [facility] [address], [place] [phone] 1 1 1 1           [facility] [address] [place], [place] [phone] 5 4 5 5   [facility])  [address], [place] [phone] 1 1 2 1   [facility]) [address] [place] [phone]  2 4 4   [facility] [place] [address]  [facility]  [address], [place] [phone] 3 5 5 5   [address] [place], [place] [phone] 4 2 2 2   [facility]) [address] [place] [phone] 2 1 3 2       Expected Discharge Plan: Skilled Nursing Facility Barriers to Discharge: Continued Medical Work up  Expected Discharge Plan and Services       Living arrangements for the past 2 months: Hotel/Motel                                       Social Determinants of Health (SDOH) Interventions SDOH Screenings   Food Insecurity: No Food Insecurity (02/06/2024)  Housing: Low Risk  (02/06/2024)  Transportation Needs: No Transportation Needs (02/06/2024)  Utilities: Not At Risk  (02/06/2024)  Depression (PHQ2-9): Low Risk  (08/16/2021)  Social Connections: Moderately Integrated (02/06/2024)  Tobacco Use: Medium Risk (02/06/2024)    Readmission Risk Interventions     No data to display

## 2024-02-11 NOTE — Plan of Care (Signed)
  Problem: Education: Goal: Knowledge of General Education information will improve Description: Including pain rating scale, medication(s)/side effects and non-pharmacologic comfort measures Outcome: Not Met (add Reason)   Problem: Health Behavior/Discharge Planning: Goal: Ability to manage health-related needs will improve Outcome: Not Met (add Reason)   Problem: Clinical Measurements: Goal: Ability to maintain clinical measurements within normal limits will improve Outcome: Not Met (add Reason) Goal: Will remain free from infection Outcome: Not Met (add Reason) Goal: Diagnostic test results will improve Outcome: Not Met (add Reason) Goal: Respiratory complications will improve Outcome: Not Met (add Reason) Goal: Cardiovascular complication will be avoided Outcome: Not Met (add Reason)   Problem: Activity: Goal: Risk for activity intolerance will decrease Outcome: Not Met (add Reason)   Problem: Nutrition: Goal: Adequate nutrition will be maintained Outcome: Not Met (add Reason)   Problem: Coping: Goal: Level of anxiety will decrease Outcome: Not Met (add Reason)   Problem: Elimination: Goal: Will not experience complications related to bowel motility Outcome: Not Met (add Reason) Goal: Will not experience complications related to urinary retention Outcome: Not Met (add Reason)   Problem: Pain Managment: Goal: General experience of comfort will improve and/or be controlled Outcome: Not Met (add Reason)   Problem: Safety: Goal: Ability to remain free from injury will improve Outcome: Not Met (add Reason)   Problem: Skin Integrity: Goal: Risk for impaired skin integrity will decrease Outcome: Not Met (add Reason)   Problem: Education: Goal: Ability to describe self-care measures that may prevent or decrease complications (Diabetes Survival Skills Education) will improve Outcome: Not Met (add Reason) Goal: Individualized Educational Video(s) Outcome: Not Met (add  Reason)   Problem: Coping: Goal: Ability to adjust to condition or change in health will improve Outcome: Not Met (add Reason)   Problem: Fluid Volume: Goal: Ability to maintain a balanced intake and output will improve Outcome: Not Met (add Reason)   Problem: Health Behavior/Discharge Planning: Goal: Ability to identify and utilize available resources and services will improve Outcome: Not Met (add Reason) Goal: Ability to manage health-related needs will improve Outcome: Not Met (add Reason)   Problem: Metabolic: Goal: Ability to maintain appropriate glucose levels will improve Outcome: Not Met (add Reason)   Problem: Nutritional: Goal: Maintenance of adequate nutrition will improve Outcome: Not Met (add Reason) Goal: Progress toward achieving an optimal weight will improve Outcome: Not Met (add Reason)   Problem: Skin Integrity: Goal: Risk for impaired skin integrity will decrease Outcome: Not Met (add Reason)   Problem: Tissue Perfusion: Goal: Adequacy of tissue perfusion will improve Outcome: Not Met (add Reason)

## 2024-02-11 NOTE — TOC Progression Note (Addendum)
 Transition of Care [facility]) - Progression Note    Patient Details  Name: Kevin [name] MRN: [id number] Date of Birth: [birth year]  Transition [facility]) CM/SW Contact  Nataya Bastedo A [place], LCSW Phone Number: 02/11/2024, 2:49 PM  Clinical Narrative:     Update 1630 CSW met with pt at beside to update him that pt's daughter had made a decision for placement. CSW notified Lacinda Axon of possible plan for SNF and insurance authorization. He stated that he wanted to speak with his daughter again before deciding on situation. CSW spoke with pt utilizing interpreter services [phone], [employer].  CSW contacted pt's daughter to assist with bed offer decision for SNF choice. She selected [facility], then [facility] if beds are not available at [facility]. CSW to reach out to [facility] and request start of insurance authorization as pt is medically stable and not managed in Home and Communtiy care portal.    TOC will continue to follow.   Expected Discharge Plan: Skilled Nursing Facility Barriers to Discharge: Continued Medical Work up  Expected Discharge Plan and Services       Living arrangements for the past 2 months: Hotel/Motel                                       Social Determinants of Health (SDOH) Interventions SDOH Screenings   Food Insecurity: No Food Insecurity (02/06/2024)  Housing: Low Risk  (02/06/2024)  Transportation Needs: No Transportation Needs (02/06/2024)  Utilities: Not At Risk (02/06/2024)  Depression (PHQ2-9): Low Risk  (08/16/2021)  Social Connections: Moderately Integrated (02/06/2024)  Tobacco Use: Medium Risk (02/06/2024)    Readmission Risk Interventions     No data to display

## 2024-02-11 NOTE — Progress Notes (Signed)
 Mobility Specialist Progress Note:    02/11/24 1136  Mobility  Activity Ambulated with assistance in hallway;Ambulated with assistance in room  Level of Assistance Contact guard assist, steadying assist  Assistive Device None  Distance Ambulated (ft) 400 ft  Activity Response Tolerated well  Mobility Referral Yes  Mobility visit 1 Mobility  Mobility Specialist Start Time (ACUTE ONLY) 1125  Mobility Specialist Stop Time (ACUTE ONLY) 1135  Mobility Specialist Time Calculation (min) (ACUTE ONLY) 10 min   Pt received in bed, daughter at bedside. Agreeable to mobility session. Ambulated in hallway, no AD required. CGA for safety and stability. Tolerated well, asx throughout. Returned pt to bed, alarm on, call bell in reach. All needs met.    Feliciana Rossetti Mobility Specialist Please contact via Special educational needs teacher or  Rehab office at [phone]

## 2024-02-11 NOTE — Progress Notes (Signed)
 Physical Therapy Treatment Patient Details Name: Kevin [name] MRN: [id number] DOB: 20-Jul-1951 Today's Date: 02/11/2024   History of Present Illness Pt is a 73 y.o. male presenting 02/06/24 s/p unwitnessed fall 2-3 days ago. Admitted with Type 2 diabetes mellitus with hyperosmolar hyperglycemic state and BP of 203/95. PMH: insulin-dependent DM type II, history of gastrointestinal internal stromal tumor, thyroid nodule, HTN, chronic muscle spasm, chronic hypokalemia, chronic iron deficiency anemia, GERD, BPH, protein calorie malnutrition, and history of noncompliance with insulin regimen and multiple episodes of hypoglycemia    PT Comments  Pt up in room on arrival and eager for mobility with continued progress towards acute goals. Video interpreter utilized throughout session. Pt A&Ox4 and with improved safety awareness and insight into current deficits. Pt requiring grossly supervision for bed mobility, transfers and gait without AD support. Pt with some moments of CGA needed during dynamic balance challenges however pt without overt LOB. Pt continues to benefit from skilled PT services to progress toward functional mobility goals.      If plan is discharge home, recommend the following: A little help with walking and/or transfers;A little help with bathing/dressing/bathroom;Assistance with cooking/housework;Direct supervision/assist for medications management;Assist for transportation;Help with stairs or ramp for entrance   Can travel by private vehicle     Yes  Equipment Recommendations  Rolling walker (2 wheels)    Recommendations for Other Services       Precautions / Restrictions Precautions Precautions: Fall Restrictions Weight Bearing Restrictions Per Provider Order: No     Mobility  Bed Mobility Overal bed mobility: Needs Assistance Bed Mobility: Sit to Supine       Sit to supine: Supervision   General bed mobility comments: sueprvision for safety    Transfers Overall  transfer level: Needs assistance Equipment used: None Transfers: Sit to/from Stand, Bed to chair/wheelchair/BSC Sit to Stand: Supervision           General transfer comment: sueprvision for safety    Ambulation/Gait Ambulation/Gait assistance: Supervision, Contact guard assist Gait Distance (Feet): 400 Feet Assistive device: None Gait Pattern/deviations: Step-through pattern, Decreased stride length, Shuffle, Trunk flexed, Drifts right/left, Narrow base of support Gait velocity: decr     General Gait Details: grossly supervision for safety, moments of CGA during dynamic balance challenges but pt without overt LOB   Stairs             Wheelchair Mobility     Tilt Bed    Modified Rankin (Stroke Patients Only)       Balance Overall balance assessment: Needs assistance Sitting-balance support: Single extremity supported, No upper extremity supported, Feet supported Sitting balance-Leahy Scale: Good Sitting balance - Comments: sitting EOB and in recliner   Standing balance support: During functional activity, No upper extremity supported Standing balance-Leahy Scale: Fair Standing balance comment: able to accept mild ballance chalnges with some drift but no overt LOB                            Communication Communication Communication: Impaired Factors Affecting Communication: Other (comment) (Non-English speaking, video intrepreter utilized theoughout session)  Cognition Arousal: Alert Behavior During Therapy: WFL for tasks assessed/performed   PT - Cognitive impairments: No apparent impairments                         Following commands: Impaired Following commands impaired: Follows multi-step commands with increased time    Cueing Cueing Techniques: Verbal cues,  Tactile cues  Exercises      General Comments General comments (skin integrity, edema, etc.): VSS on RA      Pertinent Vitals/Pain Pain Assessment Pain Assessment:  Faces Faces Pain Scale: Hurts a little bit Pain Location: abdomen; Pain Descriptors / Indicators: Grimacing, Guarding, Discomfort, Cramping Pain Intervention(s): Monitored during session, Limited activity within patient's tolerance    Home Living                          Prior Function            PT Goals (current goals can now be found in the care plan section) Acute Rehab PT Goals Patient Stated Goal: improve mobility PT Goal Formulation: With patient/family Time For Goal Achievement: 02/21/24 Progress towards PT goals: Progressing toward goals    Frequency    Min 1X/week      PT Plan      Co-evaluation              AM-PAC PT "6 Clicks" Mobility   Outcome Measure  Help needed turning from your back to your side while in a flat bed without using bedrails?: A Little Help needed moving from lying on your back to sitting on the side of a flat bed without using bedrails?: A Little Help needed moving to and from a bed to a chair (including a wheelchair)?: A Little Help needed standing up from a chair using your arms (e.g., wheelchair or bedside chair)?: A Little Help needed to walk in hospital room?: A Little Help needed climbing 3-5 steps with a railing? : A Lot 6 Click Score: 17    End of Session Equipment Utilized During Treatment: Gait belt Activity Tolerance: Patient tolerated treatment well Patient left: with call bell/phone within reach;in bed;with bed alarm set Nurse Communication: Mobility status PT Visit Diagnosis: Unsteadiness on feet (R26.81);Other abnormalities of gait and mobility (R26.89);Muscle weakness (generalized) (M62.81);History of falling (Z91.81);Difficulty in walking, not elsewhere classified (R26.2);Pain Pain - part of body:  (abdomen)     Time: 0454-0981 PT Time Calculation (min) (ACUTE ONLY): 23 min  Charges:    $Gait Training: 23-37 mins PT General Charges $$ ACUTE PT VISIT: 1 Visit                     Kristy Catoe R.  PTA Acute Rehabilitation Services Office: [phone]   Catalina Antigua 02/11/2024, 3:32 PM

## 2024-02-11 NOTE — Progress Notes (Addendum)
 Kevin [name] sent message. Patient just brought to unit 6N room 603. Patient has pink tinged urine from his chronic foley, not sure if something might have happened in transfer. Please come evaluate patient. MD responded wants to continue to monitor. Pink seems to be improving and turning clear.

## 2024-02-11 NOTE — Progress Notes (Signed)
 Foley bag emptied before transferring  the patient to 6N the urine was clear ,pt transferred in wheelchair ,not aware of  stress put on foley during transportation but upon arrival in the unit,NT noticed pink tinged urine ,Bedside nurse made aware and she state she informed the MD

## 2024-02-12 DIAGNOSIS — E1100 Type 2 diabetes mellitus with hyperosmolarity without nonketotic hyperglycemic-hyperosmolar coma (NKHHC): Secondary | ICD-10-CM | POA: Diagnosis not present

## 2024-02-12 LAB — BASIC METABOLIC PANEL
Anion gap: 7 (ref 5–15)
BUN: 19 mg/dL (ref 8–23)
CO2: 25 mmol/L (ref 22–32)
Calcium: 8.1 mg/dL — ABNORMAL LOW (ref 8.9–10.3)
Chloride: 102 mmol/L (ref 98–111)
Creatinine, Ser: 1.03 mg/dL (ref 0.61–1.24)
GFR, Estimated: >60 mL/min (ref >60)
Glucose, Bld: 129 mg/dL — ABNORMAL HIGH (ref 70–99)
Potassium: 3.7 mmol/L (ref 3.5–5.1)
Sodium: 134 mmol/L — ABNORMAL LOW (ref 135–145)

## 2024-02-12 LAB — MAGNESIUM: Magnesium: 1.8 mg/dL (ref 1.7–2.4)

## 2024-02-12 LAB — GLUCOSE, CAPILLARY
Glucose-Capillary: 321 mg/dL — ABNORMAL HIGH (ref 70–99)
Glucose-Capillary: 160 mg/dL — ABNORMAL HIGH (ref 70–99)
Glucose-Capillary: 95 mg/dL (ref 70–99)
Glucose-Capillary: 139 mg/dL — ABNORMAL HIGH (ref 70–99)

## 2024-02-12 NOTE — TOC Initial Note (Signed)
 Transition of Care (TOC) - Initial/Assessment Note   Spoke to patient at bedside with AMN interpreter Aliene Beams [phone]    OT changing recommendation to home health , recommending 3 in 1, tub/shower bench and rolling walker .   Discussed with patient home health would call to schedule visits, usually visits are twice a week for a hour at a time.   Patient staying at [facility] , he is unsure of address. He does want to return to hotel at discharge.    Patient will think about home health and DME tonight discuss with family . NCM will follow up with patient tomorrow.  Patient Details  Name: Kevin [name] MRN: [id number] Date of Birth: 1951/05/06  Transition of Care [facility]) CM/SW Contact:    Kingsley Plan, RN Phone Number: 02/12/2024, 4:29 PM  Clinical Narrative:                   Expected Discharge Plan: Home w Home Health Services Barriers to Discharge: Continued Medical Work up   Patient Goals and CMS Choice Patient states their goals for this hospitalization and ongoing recovery are:: to return home CMS Medicare.gov Compare Post Acute Care list provided to:: Patient Choice offered to / list presented to : Patient      Expected Discharge Plan and Services   Discharge Planning Services: CM Consult Post Acute Care Choice: Home Health Living arrangements for the past 2 months: Hotel/Motel                 DME Arranged:  (see note)         HH Arranged:  (see note)          Prior Living Arrangements/Services Living arrangements for the past 2 months: Hotel/Motel Lives with:: Spouse Patient language and need for interpreter reviewed:: Yes Do you feel safe going back to the place where you live?: Yes      Need for Family Participation in Patient Care: Yes (Comment) Care giver support system in place?: Yes (comment)   Criminal Activity/Legal Involvement Pertinent to Current Situation/Hospitalization: No - Comment as needed  Activities of Daily Living    ADL Screening (condition at time of admission) Independently performs ADLs?: Yes (appropriate for developmental age) Is the patient deaf or have difficulty hearing?: No Does the patient have difficulty seeing, even when wearing glasses/contacts?: No Does the patient have difficulty concentrating, remembering, or making decisions?: No  Permission Sought/Granted Permission sought to share information with : Case Manager, Family Supports Permission granted to share information with : No     Permission granted to share info w AGENCY: SNF        Emotional Assessment Appearance:: Appears older than stated age Attitude/Demeanor/Rapport: Engaged Affect (typically observed): Stable Orientation: : Oriented to Self, Oriented to Place, Oriented to  Time, Oriented to Situation Alcohol / Substance Use: Not Applicable Psych Involvement: No (comment)  Admission diagnosis:  Urinary retention [R33.9] Cervical radiculopathy [M54.12] AKI (acute kidney injury) (HCC) [N17.9] Hypertension, unspecified type [I10] Type 2 diabetes mellitus with hyperosmolar hyperglycemic state (HHS) (HCC) [E11.00] Patient Active Problem List   Diagnosis Date Noted   Type 2 diabetes mellitus with hyperosmolar hyperglycemic state (HHS) (HCC) 02/06/2024   Insulin dependent type 2 diabetes mellitus (HCC) 02/06/2024   Hyperosmolar hyponatremia 02/06/2024   Hypertensive emergency 02/06/2024   BPH (benign prostatic hyperplasia) 02/06/2024   Bilateral hydronephrosis 02/06/2024   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 (HCC) 02/06/2024   GERD (gastroesophageal  reflux disease) 02/06/2024   Thyroid nodule 02/06/2024   History of fall 02/06/2024   Sepsis (HCC) 06/06/2022   Generalized weakness 06/06/2022   Chronic anemia 06/06/2022   Gastrointestinal stromal tumor (GIST) (HCC) 05/29/2022   SIRS (systemic inflammatory response syndrome) (HCC) 04/17/2022   Lymphocytopenia 04/17/2022   Positive  blood culture 04/17/2022   Hypocalcemia 04/15/2022   Elevated troponin 04/15/2022   Essential hypertension 05/11/2021   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 [place] [address] Phone: [phone] Fax: [phone]  [facility] [address] [place] [address] Phone: [phone] Fax: [phone]  [facility] [address] [place] [address] Phone: [phone] Fax: [phone]     Social Drivers of Health (SDOH) Social History: SDOH Screenings   Food Insecurity: No Food Insecurity (02/06/2024)  Housing: Low Risk  (02/06/2024)  Transportation Needs: No Transportation Needs (02/06/2024)  Utilities: Not At Risk (02/06/2024)  Depression (PHQ2-9): Low Risk  (08/16/2021)  Social Connections: Moderately Integrated (02/06/2024)  Tobacco Use: Medium Risk (02/06/2024)   SDOH Interventions:     Readmission Risk Interventions     No data to display

## 2024-02-12 NOTE — Progress Notes (Signed)
 PROGRESS NOTE    Kevin [name]  NWG:[id number] DOB: 1951-02-09 DOA: 02/06/2024 PCP: Marcine Matar, MD   Brief Narrative:   73 year old with history of insulin-dependent type 2 diabetes, history of gastrointestinal internal stromal tumor, essential hypertension, chronic hypokalemia, GERD and history of noncompliance presented with unwitnessed fall, right arm pain and voiding on himself.  On presentation, he was hypertensive.  Blood glucose was more than 600, osmolality of 326, beta hydroxybutyrate of 2.99, sodium of 122, creatinine of 2.02.  Skeletal survey was negative for acute fractures.  He was started on IV insulin and IV fluids and subsequently switched to long-acting insulin.  He required Foley catheter placement.  Kidney function improving.  PT recommended SNF placement.  Currently medically stable for discharge to SNF.  Assessment & Plan:   Hyperosmolar hyperglycemic state in a patient with diabetes mellitus type 2 with dietary and insulin noncompliance Hyponatremia secondary to hyperglycemia Hypoglycemia -Presented after a fall at home with extremely elevated blood sugars as above. -Initially required IV insulin and fluids.  Subsequently transitioned to long-acting insulin along with short acting insulin with meals and CBGs with SSI. -Has had intermittent episodes of hypoglycemia but mostly blood sugars are on the higher side.  Diabetes coordinator following.  Continue carb modified diet. -A1c 15. -Patient needs to be compliant with diet and medications  Hypertensive emergency -Blood pressure currently stable.  Continue amlodipine.  Patient needs to be compliant with medications  AKI on CKD stage II Acute urinary retention from bladder outlet obstruction secondary to BPH Bilateral hydronephrosis secondary to BPH -Creatinine 1.25 on 02/10/2024.  No labs today. -Currently has a Foley catheter which will be continued on discharge.  Will need outpatient urology evaluation and  follow-up.  Continue Flomax  Fall at home -Imaging on presentation showed no fractures or acute abnormal findings.  Fall precautions. -PT/OT recommending SNF.  TOC following.  Thyroid nodule -Incidental finding on CT cervical spine.  Will need nonemergent thyroid ultrasound as an outpatient  Anemia of chronic disease -From chronic illnesses.  Hemoglobin stable.  Monitor intermittently  Hypokalemia -Resolved  GERD -Not currently taking Protonix.  Stable  History of GIST -Last clinic visit in oncology clinic in April 2024. -Per chart review oncology note patient was noncompliant with Gleevec which has been resumed on 03/17/2023.  However patient is not taking it currently.  Also patient has been lost to follow-up with the oncology clinic afterward. -On discharge need to refer to oncology clinic to reestablish care. -CT scan of the liver was abnormal.  MRI of the liver done, shows possibly malignant lesion.  LFTs are normal.  Previous hospitalist communicated this with his oncologist Dr. Truett Perna who will schedule follow-up.  Also reiterated to patient to keep up follow-up.  Previous hospitalist explained to him about abnormal finding on liver MRI.  DVT prophylaxis: Heparin subcutaneous Code Status: Full Family Communication: None at bedside Disposition Plan: Status is: Inpatient Remains inpatient appropriate because: Of severity of illness.  Need for SNF placement.  Currently medically stable for discharge to SNF  Consultants: None  Procedures: None  Antimicrobials: None   Subjective: Patient seen and examined at bedside.  Poor historian.  No agitation, seizures, vomiting or fever reported.  Objective: Vitals:   02/11/24 1815 02/11/24 2059 02/11/24 2300 02/12/24 0447  BP: 127/78 105/64 127/78 110/72  Pulse: 85 79 85 70  Resp: 16 16 16 15   Temp: 98.2 F (36.8 C) 98.6 F (37 C) 98.2 F (36.8 C) 98.6 F (37 C)  TempSrc:  Oral Oral Oral  SpO2: 99% 95% 99% 98%  Weight:       Height:        Intake/Output Summary (Last 24 hours) at 02/12/2024 0758 Last data filed at 02/12/2024 0700 Gross per 24 hour  Intake 960 ml  Output 3250 ml  Net -2290 ml   Filed Weights   02/06/24 1723 02/06/24 2251  Weight: 59.8 kg 53.1 kg    Examination:  General: On room air.  No distress.  Poor historian.  Slow to respond. respiratory: Decreased breath sounds at bases bilaterally with some crackles CVS: Currently rate controlled; S1-S2 heard  abdominal: Soft, nontender, slightly distended, no organomegaly; bowel sounds are heard  extremities: Trace lower extremity edema; no clubbing.   Genitourinary: Foley catheter present   Data Reviewed: I have personally reviewed following labs and imaging studies  CBC: Recent Labs  Lab 02/06/24 1729 02/08/24 0311 02/10/24 0502  WBC 5.4 8.9 7.5  NEUTROABS  --  5.2 3.6  HGB 12.9* 10.6* 10.0*  HCT 37.8* 30.2* 29.4*  MCV 93.6 92.4 94.5  PLT 256 222 240   Basic Metabolic Panel: Recent Labs  Lab 02/07/24 0033 02/07/24 0330 02/07/24 0959 02/08/24 0311 02/10/24 0502  NA 126* 129* 130* 130* 135  K 3.1* 2.8* 4.0 3.9 3.5  CL 87* 89* 93* 97* 102  CO2 24 29 21* 24 27  GLUCOSE 734* 469* 390* 377* 311*  BUN 43* 42* 40* 41* 36*  CREATININE 1.96* 1.87* 1.71* 1.37* 1.25*  CALCIUM 8.9 8.8* 9.2 8.9 8.1*  MG  --   --  1.7 1.7 1.7  PHOS  --   --  2.9  --  3.6   GFR: Estimated Creatinine Clearance: 40.1 mL/min (A) (by C-G formula based on SCr of 1.25 mg/dL (H)). Liver Function Tests: Recent Labs  Lab 02/06/24 1729 02/08/24 0311 02/10/24 0502  AST 23 19 28   ALT 20 15 16   ALKPHOS 153* 100 106  BILITOT 1.3* 0.7 0.3  PROT 7.8 6.3* 5.8*  ALBUMIN 3.3* 2.6* 2.2*   Recent Labs  Lab 02/06/24 1729  LIPASE 24   No results for input(s): "AMMONIA" in the last 168 hours. Coagulation Profile: No results for input(s): "INR", "PROTIME" in the last 168 hours. Cardiac Enzymes: No results for input(s): "CKTOTAL", "CKMB", "CKMBINDEX",  "TROPONINI" in the last 168 hours. BNP (last 3 results) No results for input(s): "PROBNP" in the last 8760 hours. HbA1C: No results for input(s): "HGBA1C" in the last 72 hours. CBG: Recent Labs  Lab 02/11/24 0833 02/11/24 1146 02/11/24 1622 02/11/24 1750 02/11/24 2310  GLUCAP 196* 281* 219* 189* 121*   Lipid Profile: No results for input(s): "CHOL", "HDL", "LDLCALC", "TRIG", "CHOLHDL", "LDLDIRECT" in the last 72 hours. Thyroid Function Tests: No results for input(s): "TSH", "T4TOTAL", "FREET4", "T3FREE", "THYROIDAB" in the last 72 hours. Anemia Panel: No results for input(s): "VITAMINB12", "FOLATE", "FERRITIN", "TIBC", "IRON", "RETICCTPCT" in the last 72 hours. Sepsis Labs: No results for input(s): "PROCALCITON", "LATICACIDVEN" in the last 168 hours.  No results found for this or any previous visit (from the past 240 hours).       Radiology Studies: No results found.      Scheduled Meds:  amLODipine  5 mg Oral Daily   Chlorhexidine Gluconate Cloth  6 each Topical Q0600   heparin  5,000 Units Subcutaneous Q8H   insulin aspart  0-15 Units Subcutaneous TID PC & HS   insulin aspart  6 Units Subcutaneous TID WC   insulin  glargine  30 Units Subcutaneous Daily   sodium chloride flush  3 mL Intravenous Q12H   sodium chloride flush  3 mL Intravenous Q12H   tamsulosin  0.8 mg Oral QPC supper   Continuous Infusions:        Glade Lloyd, MD [facility] 02/12/2024, 7:58 AM

## 2024-02-12 NOTE — TOC Progression Note (Signed)
 Transition of Care [facility]) - Progression Note    Patient Details  Name: Kevin [name] MRN: [id number] Date of Birth: 12-02-1951  [facility] CM/SW Contact  Carley Hammed, LCSW Phone Number: 02/12/2024, 10:30 AM  Clinical Narrative:    Per facility, pt's authorization continues to be pending this morning. TOC will continue to follow for DC needs.    Expected Discharge Plan: Skilled Nursing Facility Barriers to Discharge: Continued Medical Work up  Expected Discharge Plan and Services       Living arrangements for the past 2 months: Hotel/Motel                                       Social Determinants of Health (SDOH) Interventions SDOH Screenings   Food Insecurity: No Food Insecurity (02/06/2024)  Housing: Low Risk  (02/06/2024)  Transportation Needs: No Transportation Needs (02/06/2024)  Utilities: Not At Risk (02/06/2024)  Depression (PHQ2-9): Low Risk  (08/16/2021)  Social Connections: Moderately Integrated (02/06/2024)  Tobacco Use: Medium Risk (02/06/2024)    Readmission Risk Interventions     No data to display

## 2024-02-12 NOTE — Progress Notes (Addendum)
 Occupational Therapy Treatment Patient Details Name: Kevin [name] MRN: [id number] DOB: 1951/03/27 Today's Date: 02/12/2024   History of present illness Pt is a 73 y.o. male presenting 02/06/24 s/p unwitnessed fall 2-3 days ago. Admitted with Type 2 diabetes mellitus with hyperosmolar hyperglycemic state and BP of 203/95. PMH: insulin-dependent DM type II, history of gastrointestinal internal stromal tumor, thyroid nodule, HTN, chronic muscle spasm, chronic hypokalemia, chronic iron deficiency anemia, GERD, BPH, protein calorie malnutrition, and history of noncompliance with insulin regimen and multiple episodes of hypoglycemia   OT comments  Pt feeling good, no complaints, able to fully participate in therapy. Pt independent for in/out of bed. Pt able to ambulate 800 feet without AD, no LOB, able to stand on one foot and shake out his R foot as he c/o mild pain during ambulation, but did not slow him down or cause gait disturbance. Pt able to bend down while standing unsupported, no LOB. Pt completed ADLs at bedside with supervision for safety. Pt at this time needs no physical support to complete ADLs or mobility. Pt is at a level he can safely return home, but due to history of poor medication management Pt is at risk for further medical emergencies if he does not manage his insulin dependent DMII. Recommending HHOT to maximize safety in home setting with medication management, and due to recent fall may benefit from ensuring home environment is set up safely to reduce risk of future falls. All further OT needs can be met at next venue of care, signing off.       If plan is discharge home, recommend the following:  A little help with walking and/or transfers;A little help with bathing/dressing/bathroom;Assistance with cooking/housework;Direct supervision/assist for medications management;Direct supervision/assist for financial management;Assist for transportation;Help with stairs or ramp for entrance    Equipment Recommendations  BSC/3in1;Tub/shower bench    Recommendations for Other Services      Precautions / Restrictions Precautions Precautions: Fall Restrictions Weight Bearing Restrictions Per Provider Order: No       Mobility Bed Mobility Overal bed mobility: Modified Independent                  Transfers Overall transfer level: Needs assistance Equipment used: None Transfers: Sit to/from Stand, Bed to chair/wheelchair/BSC Sit to Stand: Supervision     Step pivot transfers: Supervision     General transfer comment: supervision for safety, no LOB     Balance Overall balance assessment: Needs assistance Sitting-balance support: No upper extremity supported, Feet supported Sitting balance-Leahy Scale: Good     Standing balance support: No upper extremity supported Standing balance-Leahy Scale: Good Standing balance comment: able to stand on one leg without losing balance                           ADL either performed or assessed with clinical judgement   ADL Overall ADL's : Needs assistance/impaired                                       General ADL Comments: set up/supervision    Extremity/Trunk Assessment Upper Extremity Assessment Upper Extremity Assessment: Overall WFL for tasks assessed            Vision       Perception     Praxis     Communication     Cognition Arousal: Alert Behavior  During Therapy: WFL for tasks assessed/performed Cognition: No family/caregiver present to determine baseline             OT - Cognition Comments: Grossly WFLs, able to fully participate in session                 Following commands: Intact        Cueing      Exercises      Shoulder Instructions       General Comments      Pertinent Vitals/ Pain       Pain Assessment Pain Assessment: No/denies pain  Home Living                                          Prior  Functioning/Environment              Frequency  Min 1X/week        Progress Toward Goals  OT Goals(current goals can now be found in the care plan section)  Progress towards OT goals: Progressing toward goals  Acute Rehab OT Goals Patient Stated Goal: to improve strength OT Goal Formulation: With patient Time For Goal Achievement: 02/21/24 Potential to Achieve Goals: Good ADL Goals Pt Will Perform Grooming: with supervision;standing Pt Will Perform Lower Body Bathing: with supervision;sitting/lateral leans;sit to/from stand Pt Will Perform Lower Body Dressing: with supervision;sitting/lateral leans;sit to/from stand Pt Will Transfer to Toilet: with supervision;ambulating;regular height toilet;grab bars Pt Will Perform Toileting - Clothing Manipulation and hygiene: with supervision;sitting/lateral leans;sit to/from stand Pt Will Perform Tub/Shower Transfer: with supervision;tub bench  Plan      Co-evaluation                 AM-PAC OT "6 Clicks" Daily Activity     Outcome Measure   Help from another person eating meals?: None Help from another person taking care of personal grooming?: A Little Help from another person toileting, which includes using toliet, bedpan, or urinal?: A Little Help from another person bathing (including washing, rinsing, drying)?: A Little Help from another person to put on and taking off regular upper body clothing?: A Little Help from another person to put on and taking off regular lower body clothing?: A Little 6 Click Score: 19    End of Session Equipment Utilized During Treatment: Gait belt  OT Visit Diagnosis: Unsteadiness on feet (R26.81);Other abnormalities of gait and mobility (R26.89);Muscle weakness (generalized) (M62.81);Pain;History of falling (Z91.81);Other (comment)   Activity Tolerance Patient tolerated treatment well   Patient Left in bed;with call bell/phone within reach;with bed alarm set   Nurse Communication  Mobility status        Time: 1610-9604 OT Time Calculation (min): 17 min  Charges: OT General Charges $OT Visit: 1 Visit OT Treatments $Therapeutic Activity: 8-22 mins  Blaine, OTR/L   Alexis Goodell 02/12/2024, 3:56 PM

## 2024-02-13 ENCOUNTER — Other Ambulatory Visit (HOSPITAL_COMMUNITY): Payer: Self-pay

## 2024-02-13 ENCOUNTER — Other Ambulatory Visit: Payer: Self-pay

## 2024-02-13 DIAGNOSIS — E1100 Type 2 diabetes mellitus with hyperosmolarity without nonketotic hyperglycemic-hyperosmolar coma (NKHHC): Secondary | ICD-10-CM | POA: Diagnosis not present

## 2024-02-13 LAB — GLUCOSE, CAPILLARY
Glucose-Capillary: 177 mg/dL — ABNORMAL HIGH (ref 70–99)
Glucose-Capillary: 88 mg/dL (ref 70–99)

## 2024-02-13 MED ORDER — METFORMIN HCL 500 MG PO TABS
500.0000 mg | ORAL_TABLET | Freq: Two times a day (BID) | ORAL | 0 refills | Status: DC
  Filled 2024-02-13 (×2): qty 60, 30d supply, fill #0

## 2024-02-13 MED ORDER — TAMSULOSIN HCL 0.4 MG PO CAPS
0.8000 mg | ORAL_CAPSULE | Freq: Every day | ORAL | 0 refills | Status: DC
  Filled 2024-02-13 (×2): qty 30, 15d supply, fill #0

## 2024-02-13 MED ORDER — AMLODIPINE BESYLATE 5 MG PO TABS
5.0000 mg | ORAL_TABLET | Freq: Every day | ORAL | 0 refills | Status: DC
  Filled 2024-02-13 (×2): qty 30, 30d supply, fill #0

## 2024-02-13 MED ORDER — LANTUS SOLOSTAR 100 UNIT/ML ~~LOC~~ SOPN
30.0000 [IU] | PEN_INJECTOR | Freq: Every day | SUBCUTANEOUS | 0 refills | Status: DC
  Filled 2024-02-13 (×2): qty 15, 50d supply, fill #0

## 2024-02-13 NOTE — Discharge Summary (Signed)
 Physician Discharge Summary  Kevin [name] VZD:[id number] DOB: [birth year] DOA: 02/06/2024  PCP: Marcine Matar, MD  Admit date: 02/06/2024 Discharge date: 02/13/2024  Admitted From: Home Disposition: Home  Recommendations for Outpatient Follow-up:  Follow up with PCP in 1 week with repeat CBC/BMP Outpatient follow up with Urology Follow up in ED if symptoms worsen or new appear   Home Health: No. Refused Home health Equipment/Devices: None  Discharge Condition: Stable CODE STATUS: Full Diet recommendation: Heart healthy/carb modified.  Brief/Interim Summary: 73 year old with history of insulin-dependent type 2 diabetes, history of gastrointestinal internal stromal tumor, essential hypertension, chronic hypokalemia, GERD and history of noncompliance presented with unwitnessed fall, right arm pain and voiding on himself. On presentation, he was hypertensive. Blood glucose was more than 600, osmolality of 326, beta hydroxybutyrate of 2.99, sodium of 122, creatinine of 2.02. Skeletal survey was negative for acute fractures. He was started on IV insulin and IV fluids and subsequently switched to long-acting insulin. He required Foley catheter placement. Kidney function improving. PT recommended SNF placement but subsequently recommended Home health. Discharge patient today. He needs to be compliant with meds and follow up.  Discharge Diagnoses:   Hyperosmolar hyperglycemic state in a patient with diabetes mellitus type 2 with dietary and insulin noncompliance Hyponatremia secondary to hyperglycemia Hypoglycemia -Presented after a fall at home with extremely elevated blood sugars as above. -Initially required IV insulin and fluids.  Subsequently transitioned to long-acting insulin along with short acting insulin with meals and CBGs with SSI. -Has had intermittent episodes of hypoglycemia but mostly blood sugars are on the higher side.  Diabetes coordinator following.  Continue carb  modified diet. -A1c 15. -Patient needs to be compliant with diet and medications -Continue long acting insulin and metformin on DC. DC home today.   Hypertensive emergency -Blood pressure currently stable.  Continue amlodipine.  Patient needs to be compliant with medications   AKI on CKD stage II Acute urinary retention from bladder outlet obstruction secondary to BPH Bilateral hydronephrosis secondary to BPH -Creatinine 1.25 on 02/10/2024.  No labs today. -Currently has a Foley catheter which will be continued on discharge.  Will need outpatient urology evaluation and follow-up.  Continue Flomax   Fall at home -Imaging on presentation showed no fractures or acute abnormal findings.  Fall precautions. -PT recommended SNF placement but subsequently recommended Home health.   Thyroid nodule -Incidental finding on CT cervical spine.  Will need nonemergent thyroid ultrasound as an outpatient   Anemia of chronic disease -From chronic illnesses.  Hemoglobin stable.  Monitor intermittently   Hypokalemia -Resolved   GERD -Not currently taking Protonix.  Stable   History of GIST -Last clinic visit in oncology clinic in April 2024. -Per chart review oncology note patient was noncompliant with Gleevec which has been resumed on 03/17/2023.  However patient is not taking it currently.  Also patient has been lost to follow-up with the oncology clinic afterward. -On discharge need to refer to oncology clinic to reestablish care. -CT scan of the liver was abnormal.  MRI of the liver done, shows possibly malignant lesion.  LFTs are normal.  Previous hospitalist communicated this with his oncologist Dr. Truett Perna who will schedule follow-up.  Also reiterated to patient to keep up follow-up.  Previous hospitalist explained to him about abnormal finding on liver MRI.   Discharge Instructions  Discharge Instructions     Diet - low sodium heart healthy   Complete by: As directed    Diet general  Complete by: As directed    Increase activity slowly   Complete by: As directed       Allergies as of 02/13/2024   No Known Allergies      Medication List     STOP taking these medications    lisinopril 10 MG tablet Commonly known as: ZESTRIL   ondansetron 4 MG disintegrating tablet Commonly known as: ZOFRAN-ODT       TAKE these medications    amLODipine 5 MG tablet Commonly known as: NORVASC Take 1 tablet (5 mg total) by mouth daily. Start taking on: February 14, 2024   FreeStyle Libre 3 Sensor Misc Use to check blood sugar continuously throughout the day. Change sensors once every 14 day.   imatinib 100 MG tablet Commonly known as: Gleevec Take 2 tablets (200 mg total) by mouth daily. Take with meals and large glass of water.Caution:Chemotherapy   Lantus SoloStar 100 UNIT/ML Solostar Pen Generic drug: insulin glargine Inject 30 Units into the skin daily. What changed: how much to take   metFORMIN 500 MG tablet Commonly known as: GLUCOPHAGE Take 1 tablet (500 mg total) by mouth 2 (two) times daily with a meal.   OneTouch Verio test strip Generic drug: glucose blood Use to check blood sugar once daily. Please schedule visit with Dr. Laural Benes.   tamsulosin 0.4 MG Caps capsule Commonly known as: FLOMAX Take 2 capsules (0.8 mg total) by mouth daily after supper.   TRUEplus 5-Bevel Pen Needles 31G X 5 MM Misc Generic drug: Insulin Pen Needle Use with insulin daily.        Follow-up Information     Marcine Matar, MD. Schedule an appointment as soon as possible for a visit in 1 week(s).   Specialty: Internal Medicine Contact information: [address] [place] [address] [phone]                No Known Allergies  Consultations: None   Procedures/Studies: MR LIVER W WO CONTRAST Result Date: 02/08/2024 CLINICAL DATA:  Characterize liver lesion incidentally identified by prior noncontrast CT EXAM: MRI ABDOMEN WITHOUT AND  WITH CONTRAST TECHNIQUE: Multiplanar multisequence MR imaging of the abdomen was performed both before and after the administration of intravenous contrast. CONTRAST:  5mL GADAVIST GADOBUTROL 1 MMOL/ML IV SOLN COMPARISON:  CT chest abdomen pelvis, 02/06/2024 FINDINGS: Examination is generally somewhat limited by breath motion artifact. Lower chest: Cardiomegaly.  Trace pleural effusions. Hepatobiliary: Circumscribed heterogeneously T2 hyperintense lesion of the posterior liver dome, hepatic segment VII, measuring 2.1 x 1.4 cm (series 8, image 5). This appears to be hypoenhancing on multiphasic contrast enhanced sequences however by manual measurements does appear to have low-level internal contrast enhancement. There is an additional smaller lesion posteriorly measuring 0.6 cm with similar characteristics (series 8, image 10). Contracted gallbladder. No gallstones, gallbladder wall thickening, or biliary dilatation. Pancreas: Status post distal pancreatectomy. No pancreatic ductal dilatation or surrounding inflammatory changes. Spleen: Status post splenectomy. Adrenals/Urinary Tract: Adrenal glands are unremarkable. Multifocal renal cortical scarring of the right kidney. Kidneys are otherwise normal, without renal calculi, solid lesion, or hydronephrosis. Stomach/Bowel: Status post partial gastrectomy. No evidence of bowel wall thickening, distention, or inflammatory changes. Moderate burden of stool throughout the colon. Vascular/Lymphatic: No significant vascular findings are present. No enlarged abdominal lymph nodes. Other: No abdominal wall hernia or abnormality. No ascites. Musculoskeletal: No acute or significant osseous findings. IMPRESSION: 1. Circumscribed hypoenhancing lesion of the posterior liver dome, hepatic segment VII, measuring 2.1 x 1.4 cm.  Additional smaller lesion posteriorly in hepatic segment VI measuring 0.6 cm with similar characteristics. As previously reported, these are new when compared  to more remote prior examinations and with no clearly benign imaging features are concerning for malignancy or metastases. 2. Status post distal pancreatectomy and splenectomy. 3. Cardiomegaly. 4. Trace pleural effusions. Electronically Signed   By: Jearld Lesch M.D.   On: 02/08/2024 16:52   ECHOCARDIOGRAM COMPLETE Result Date: 02/07/2024    ECHOCARDIOGRAM REPORT   Patient Name:   JONA ZAPPONE Date of Exam: 02/07/2024 Medical Rec #:  [id number]      Height:       62.0 in Accession #:    [id number]     Weight:       117.1 lb Date of Birth:  1951-01-15      BSA:          1.523 m Patient Age:    73 years       BP:           99/63 mmHg Patient Gender: M              HR:           79 bpm. Exam Location:  Inpatient Procedure: 2D Echo, Cardiac Doppler, Color Doppler and 3D Echo (Both Spectral            and Color Flow Doppler were utilized during procedure). Indications:    Hypertension Emergency  History:        Patient has no prior history of Echocardiogram examinations.                 Risk Factors:Hypertension, Diabetes and Current Smoker.  Sonographer:    Lucendia Herrlich RCS Referring Phys: [phone] SUBRINA SUNDIL IMPRESSIONS  1. Left ventricular ejection fraction, by estimation, is 60 to 65%. Left ventricular ejection fraction by 3D volume is 62 %. The left ventricle has normal function. The left ventricle has no regional wall motion abnormalities. Left ventricular diastolic  parameters are consistent with Grade I diastolic dysfunction (impaired relaxation).  2. Right ventricular systolic function is normal. The right ventricular size is normal. Tricuspid regurgitation signal is inadequate for assessing PA pressure.  3. Left atrial size was mildly dilated.  4. The mitral valve is grossly normal. No evidence of mitral valve regurgitation.  5. The aortic valve is tricuspid. Aortic valve regurgitation is not visualized.  6. The inferior vena cava is normal in size with greater than 50% respiratory variability,  suggesting right atrial pressure of 3 mmHg. Comparison(s): No prior Echocardiogram. FINDINGS  Left Ventricle: Left ventricular ejection fraction, by estimation, is 60 to 65%. Left ventricular ejection fraction by 3D volume is 62 %. The left ventricle has normal function. The left ventricle has no regional wall motion abnormalities. Strain imaging was not performed. The left ventricular internal cavity size was normal in size. There is no left ventricular hypertrophy. Left ventricular diastolic parameters are consistent with Grade I diastolic dysfunction (impaired relaxation). Indeterminate filling pressures. Right Ventricle: The right ventricular size is normal. No increase in right ventricular wall thickness. Right ventricular systolic function is normal. Tricuspid regurgitation signal is inadequate for assessing PA pressure. Left Atrium: Left atrial size was mildly dilated. Right Atrium: Right atrial size was normal in size. Pericardium: There is no evidence of pericardial effusion. Mitral Valve: The mitral valve is grossly normal. No evidence of mitral valve regurgitation. Tricuspid Valve: The tricuspid valve is normal in structure. Tricuspid valve regurgitation is not demonstrated. Aortic  Valve: The aortic valve is tricuspid. Aortic valve regurgitation is not visualized. Aortic valve peak gradient measures 5.9 mmHg. Pulmonic Valve: The pulmonic valve was normal in structure. Pulmonic valve regurgitation is not visualized. Aorta: The aortic root and ascending aorta are structurally normal, with no evidence of dilitation. Venous: The inferior vena cava is normal in size with greater than 50% respiratory variability, suggesting right atrial pressure of 3 mmHg. IAS/Shunts: No atrial level shunt detected by color flow Doppler. Additional Comments: 3D was performed not requiring image post processing on an independent workstation and was normal.  LEFT VENTRICLE PLAX 2D LVIDd:         4.30 cm         Diastology LVIDs:          2.80 cm         LV e' medial:    5.98 cm/s LV PW:         1.10 cm         LV E/e' medial:  7.6 LV IVS:        1.00 cm         LV e' lateral:   8.59 cm/s LVOT diam:     2.40 cm         LV E/e' lateral: 5.3 LV SV:         64 LV SV Index:   42 LVOT Area:     4.52 cm        3D Volume EF                                LV 3D EF:    Left                                             ventricul                                             ar                                             ejection                                             fraction                                             by 3D                                             volume is  62 %.                                 3D Volume EF:                                3D EF:        62 %                                LV EDV:       160 ml                                LV ESV:       60 ml                                LV SV:        100 ml RIGHT VENTRICLE             IVC RV S prime:     20.30 cm/s  IVC diam: 1.20 cm TAPSE (M-mode): 2.0 cm LEFT ATRIUM           Index        RIGHT ATRIUM          Index LA diam:      3.00 cm 1.97 cm/m   RA Area:     9.77 cm LA Vol (A2C): 56.3 ml 36.98 ml/m  RA Volume:   14.80 ml 9.72 ml/m LA Vol (A4C): 35.9 ml 23.58 ml/m  AORTIC VALVE AV Area (Vmax): 3.16 cm AV Vmax:        121.00 cm/s AV Peak Grad:   5.9 mmHg LVOT Vmax:      84.40 cm/s LVOT Vmean:     54.500 cm/s LVOT VTI:       0.141 m  AORTA Ao Root diam: 3.90 cm Ao Asc diam:  3.30 cm MITRAL VALVE MV Area (PHT): 3.65 cm    SHUNTS MV Decel Time: 208 msec    Systemic VTI:  0.14 m MV E velocity: 45.70 cm/s  Systemic Diam: 2.40 cm MV A velocity: 65.10 cm/s MV E/A ratio:  0.70 Zoila Shutter MD Electronically signed by Zoila Shutter MD Signature Date/Time: 02/07/2024/3:35:15 PM    Final    CT CHEST ABDOMEN PELVIS WO CONTRAST Result Date: 02/06/2024 CLINICAL DATA:  Poly trauma, blunt.  Trauma due to a fall. EXAM: CT CHEST, ABDOMEN  AND PELVIS WITHOUT CONTRAST TECHNIQUE: Multidetector CT imaging of the chest, abdomen and pelvis was performed following the standard protocol without IV contrast. RADIATION DOSE REDUCTION: This exam was performed according to the departmental dose-optimization program which includes automated exposure control, adjustment of the mA and/or kV according to patient size and/or use of iterative reconstruction technique. COMPARISON:  Chest radiograph 06/05/2022. CT abdomen and pelvis 06/05/2022. FINDINGS: CT CHEST FINDINGS Cardiovascular: Normal heart size. No pericardial effusions. Normal caliber thoracic aorta. Minimal calcification in the aorta. Coronary artery calcifications. Mediastinum/Nodes: No mediastinal gas or hematoma. Esophagus is decompressed. No significant lymphadenopathy. Multiple thyroid nodules, largest on the right measuring 3.1 cm diameter. Lungs/Pleura: Lungs are clear. No pleural effusion or pneumothorax. Few scattered subcentimeter calcified granulomas are present. No significant pulmonary  nodules. No imaging follow-up is indicated. Musculoskeletal: Degenerative changes in the spine. No acute bony abnormalities. CT ABDOMEN PELVIS FINDINGS Hepatobiliary: Poorly defined low-attenuation lesion in the dome of the liver measuring 1.8 cm diameter. This is new since previous study. Consider follow-up with elective MRI to exclude a developing liver mass. Gallbladder and bile ducts are unremarkable. Pancreas: Unremarkable. No pancreatic ductal dilatation or surrounding inflammatory changes. Spleen: Spleen is surgically absent. Adrenals/Urinary Tract: No adrenal gland nodules. Left renal cysts are unchanged since prior study. No imaging follow-up is indicated. Bilateral hydronephrosis and hydroureter. No obstructing stone or mass is identified. The bladder is diffusely fluid distended. Changes likely to represent reflux or stasis, possibly associated with bladder outlet obstruction. No bladder wall  thickening or filling defect. Stomach/Bowel: Postoperative changes in the stomach. Stomach, small bowel, and colon are not abnormally distended. No wall thickening or inflammatory changes. Appendix is normal. Vascular/Lymphatic: No significant vascular findings are present. No enlarged abdominal or pelvic lymph nodes. Reproductive: Prostate gland is enlarged, measuring 6.4 cm diameter. Other: No free air or free fluid in the abdomen. Abdominal wall musculature appears intact. No mesenteric hematomas. Musculoskeletal: Degenerative changes in the lumbar spine. No acute bony abnormalities. IMPRESSION: 1. No acute posttraumatic changes demonstrated in the chest, abdomen, or pelvis. 2. Multiple thyroid nodules, most prominent a 3.1 cm calcified nodule in the right. Recommend follow-up with elective thyroid US (Ref: J Am Coll Radiol. 2015 Feb;12(2): 143-50). 3. Prominent bladder distention with bilateral hydronephrosis and hydroureter likely representing stasis or reflux, likely associated with bladder outlet obstruction. Prostate gland is diffusely enlarged. 4. Suggestion of developing hypodense nodule in the liver measuring 1.8 cm diameter. Consider elective MRI follow-up further characterization. Electronically Signed   By: Burman Nieves M.D.   On: 02/06/2024 20:54   CT Cervical Spine Wo Contrast Result Date: 02/06/2024 CLINICAL DATA:  Fall 2-3 days ago.  Patient has been down since. EXAM: CT CERVICAL SPINE WITHOUT CONTRAST TECHNIQUE: Multidetector CT imaging of the cervical spine was performed without intravenous contrast. Multiplanar CT image reconstructions were also generated. RADIATION DOSE REDUCTION: This exam was performed according to the departmental dose-optimization program which includes automated exposure control, adjustment of the mA and/or kV according to patient size and/or use of iterative reconstruction technique. COMPARISON:  None Available. FINDINGS: Alignment: No significant listhesis is  present. Normal cervical lordosis is present. Skull base and vertebrae: The vertebral body heights are normal. No acute or healing fractures are present. Soft tissues and spinal canal: A calcified nodule the right lobe of the thyroid measures 3.4 x 3.0 x 4.3 cm. Additional nodules are present in the isthmus and inferior left lobe. This displaces the airway slightly to the left. No significant adenopathy is present. Disc levels: Multilevel right-sided facet and uncovertebral spurring leads to moderate right foraminal stenosis at C4-5, C5-6 and C6-7. Upper chest: The lung apices are clear. The thoracic inlet is within normal limits. IMPRESSION: 1. No acute or healing fractures of the cervical spine. 2. Multilevel right-sided facet and uncovertebral spurring leads to moderate right foraminal stenosis at C4-5, C5-6 and C6-7. 3. 3.4 x 3.0 x 4.3 cm calcified nodule in the right lobe of the thyroid. Additional nodules are present in the isthmus and inferior left lobe. Recommend non-emergent thyroid ultrasound. Reference: J Am Coll Radiol. 2015 Feb;12(2): 143-50 Electronically Signed   By: Marin Roberts M.D.   On: 02/06/2024 20:43   CT Head Wo Contrast Result Date: 02/06/2024 CLINICAL DATA:  Head trauma. Patient fell 2-3 days  ago and has been down since. EXAM: CT HEAD WITHOUT CONTRAST TECHNIQUE: Contiguous axial images were obtained from the base of the skull through the vertex without intravenous contrast. RADIATION DOSE REDUCTION: This exam was performed according to the departmental dose-optimization program which includes automated exposure control, adjustment of the mA and/or kV according to patient size and/or use of iterative reconstruction technique. COMPARISON:  None Available. FINDINGS: Brain: Mild atrophy and white matter changes are within normal limits for age. No acute infarct, hemorrhage, or mass lesion is present. Deep brain nuclei are within normal limits. The ventricles are of normal size. No  significant extraaxial fluid collection is present. The brainstem and cerebellum are within normal limits. Midline structures are within normal limits. Vascular: Atherosclerotic calcifications are present within the cavernous internal carotid arteries bilaterally. No hyperdense vessel is present. Skull: Calvarium is intact. No focal lytic or blastic lesions are present. No significant extracranial soft tissue lesion is present. Sinuses/Orbits: The paranasal sinuses and mastoid air cells are clear. The globes and orbits are within normal limits. IMPRESSION: 1. No acute intracranial abnormality or significant traumatic injury. 2. Mild atrophy and white matter changes are within normal limits for age. Electronically Signed   By: Marin Roberts M.D.   On: 02/06/2024 20:39   DG Shoulder Right Portable Result Date: 02/06/2024 CLINICAL DATA:  Fall.  Pain. EXAM: RIGHT SHOULDER - 1 VIEW COMPARISON:  None Available. FINDINGS: Visualized portion of the right hemithorax is normal. No acute fracture or dislocation. Degenerative changes of the glenohumeral and acromioclavicular joints are moderate. IMPRESSION: Degenerative change, without acute osseous finding. Electronically Signed   By: Jeronimo Greaves M.D.   On: 02/06/2024 19:59      Subjective: Patient seen and examined at bedside.  Poor historian.  No seizures, fever, vomiting or agitation reported.    Discharge Exam: Vitals:   02/13/24 0454 02/13/24 0831  BP: 112/67 115/70  Pulse: 71 68  Resp: 18 17  Temp: 98.6 F (37 C) (!) 97.5 F (36.4 C)  SpO2: 96% 97%    General: Pt is alert, awake, not in acute distress.  Slow to respond.  Flat affect.  Has Foley catheter.  On room air. Cardiovascular: rate controlled, S1/S2 + Respiratory: bilateral decreased breath sounds at bases Abdominal: Soft, NT, ND, bowel sounds + Extremities: no edema, no cyanosis    The results of significant diagnostics from this hospitalization (including imaging,  microbiology, ancillary and laboratory) are listed below for reference.     Microbiology: No results found for this or any previous visit (from the past 240 hours).   Labs: BNP (last 3 results) No results for input(s): "BNP" in the last 8760 hours. Basic Metabolic Panel: Recent Labs  Lab 02/07/24 0330 02/07/24 0959 02/08/24 0311 02/10/24 0502 02/12/24 0654  NA 129* 130* 130* 135 134*  K 2.8* 4.0 3.9 3.5 3.7  CL 89* 93* 97* 102 102  CO2 29 21* 24 27 25   GLUCOSE 469* 390* 377* 311* 129*  BUN 42* 40* 41* 36* 19  CREATININE 1.87* 1.71* 1.37* 1.25* 1.03  CALCIUM 8.8* 9.2 8.9 8.1* 8.1*  MG  --  1.7 1.7 1.7 1.8  PHOS  --  2.9  --  3.6  --    Liver Function Tests: Recent Labs  Lab 02/06/24 1729 02/08/24 0311 02/10/24 0502  AST 23 19 28   ALT 20 15 16   ALKPHOS 153* 100 106  BILITOT 1.3* 0.7 0.3  PROT 7.8 6.3* 5.8*  ALBUMIN 3.3* 2.6* 2.2*  Recent Labs  Lab 02/06/24 1729  LIPASE 24   No results for input(s): "AMMONIA" in the last 168 hours. CBC: Recent Labs  Lab 02/06/24 1729 02/08/24 0311 02/10/24 0502  WBC 5.4 8.9 7.5  NEUTROABS  --  5.2 3.6  HGB 12.9* 10.6* 10.0*  HCT 37.8* 30.2* 29.4*  MCV 93.6 92.4 94.5  PLT 256 222 240   Cardiac Enzymes: No results for input(s): "CKTOTAL", "CKMB", "CKMBINDEX", "TROPONINI" in the last 168 hours. BNP: Invalid input(s): "POCBNP" CBG: Recent Labs  Lab 02/12/24 0813 02/12/24 1320 02/12/24 1631 02/12/24 2030 02/13/24 0805  GLUCAP 321* 160* 95 139* 177*   D-Dimer No results for input(s): "DDIMER" in the last 72 hours. Hgb A1c No results for input(s): "HGBA1C" in the last 72 hours. Lipid Profile No results for input(s): "CHOL", "HDL", "LDLCALC", "TRIG", "CHOLHDL", "LDLDIRECT" in the last 72 hours. Thyroid function studies No results for input(s): "TSH", "T4TOTAL", "T3FREE", "THYROIDAB" in the last 72 hours.  Invalid input(s): "FREET3" Anemia work up No results for input(s): "VITAMINB12", "FOLATE", "FERRITIN",  "TIBC", "IRON", "RETICCTPCT" in the last 72 hours. Urinalysis    Component Value Date/Time   COLORURINE STRAW (A) 02/06/2024 2011   APPEARANCEUR CLEAR 02/06/2024 2011   LABSPEC 1.018 02/06/2024 2011   PHURINE 5.0 02/06/2024 2011   GLUCOSEU >=500 (A) 02/06/2024 2011   HGBUR SMALL (A) 02/06/2024 2011   BILIRUBINUR NEGATIVE 02/06/2024 2011   BILIRUBINUR negative 04/03/2023 1659   KETONESUR 20 (A) 02/06/2024 2011   PROTEINUR NEGATIVE 02/06/2024 2011   UROBILINOGEN 0.2 04/03/2023 1659   NITRITE NEGATIVE 02/06/2024 2011   LEUKOCYTESUR NEGATIVE 02/06/2024 2011   Sepsis Labs Recent Labs  Lab 02/06/24 1729 02/08/24 0311 02/10/24 0502  WBC 5.4 8.9 7.5   Microbiology No results found for this or any previous visit (from the past 240 hours).   Time coordinating discharge: 35 minutes  SIGNED:   Glade Lloyd, MD  [facility] 02/13/2024, 11:53 AM

## 2024-02-13 NOTE — TOC Progression Note (Signed)
 Transition of Care (TOC) - Progression Note   Received a secure chat from MD that patient medically ready for discharge.   NCM spoke to patient with AMN interpreter [facility] [phone] . Patient again said NCM rushing him and he cannot make a decision on home health and DME. NCM explained MD planning on discharging patient today. Patient does not believe NCM  that he is being discharged today. He states no one else has told him and he believes NCM is rushing him.       He cannot make a decision on HH, or 3 in1 , tub bench and a walker, until he speaks to a MD.    NCM secure chatted Dr Hanley Ben and asked when he states to patient if he can discuss with patient. Dr Hanley Ben has already discuss discharge today with patient.    He said yesterday he is going to the [facility]. [facility] address [address] [place], [place] [address]     He did not want NCM to call family   NCM explained to patient prior to discharge hospital nurse will provide foley catheter care   Patient did not consent for NCM to arrange home health and DME.    Patient Details  Name: Kevin [name] MRN: [id number] Date of Birth: [birth year]  Transition of Care [facility]) CM/SW Contact  Breanne Olvera, Adria Devon, RN Phone Number: 02/13/2024, 10:13 AM  Clinical Narrative:       Expected Discharge Plan: Home w Home Health Services Barriers to Discharge: Continued Medical Work up  Expected Discharge Plan and Services   Discharge Planning Services: CM Consult Post Acute Care Choice: Home Health Living arrangements for the past 2 months: Hotel/Motel                 DME Arranged:  (see note)         HH Arranged:  (see note)           Social Determinants of Health (SDOH) Interventions SDOH Screenings   Food Insecurity: No Food Insecurity (02/06/2024)  Housing: Low Risk  (02/06/2024)  Transportation Needs: No Transportation Needs (02/06/2024)  Utilities: Not At Risk (02/06/2024)  Depression (PHQ2-9): Low Risk  (08/16/2021)   Social Connections: Moderately Integrated (02/06/2024)  Tobacco Use: Medium Risk (02/06/2024)    Readmission Risk Interventions     No data to display

## 2024-02-13 NOTE — Progress Notes (Signed)
 Pt discharged home in stable condition after going over discharge education especially around foley care and maintenance. All questions addressed using the interpreter, no concerns expressed before leaving the unit

## 2024-02-16 ENCOUNTER — Telehealth: Payer: Self-pay

## 2024-02-16 NOTE — Transitions of Care (Post Inpatient/ED Visit) (Signed)
   02/16/2024  Name: Kevin [name] MRN: [id number] DOB: Oct 10, 1951  Today's TOC FU Call Status: Today's TOC FU Call Status:: Unsuccessful Call (1st Attempt) Unsuccessful Call (1st Attempt) Date: 02/16/24  Attempted to reach the patient regarding the most recent Inpatient/ED visit.  Follow Up Plan: Additional outreach attempts will be made to reach the patient to complete the Transitions of Care (Post Inpatient/ED visit) call.   Terell Kincy A. Mliss Fritz RN, BA, [facility], CRRN [facility] RN Care Manager, Transition of Care [phone]

## 2024-02-16 NOTE — Transitions of Care (Post Inpatient/ED Visit) (Signed)
   02/16/2024  Name: JAMORRIS NDIAYE MRN: [id number] DOB: May 25, 1951  Today's TOC FU Call Status: Today's TOC FU Call Status:: Unsuccessful Call (1st Attempt) Unsuccessful Call (1st Attempt) Date: 02/16/24  Attempted to reach the patient regarding the most recent Inpatient/ED visit.  Follow Up Plan: Additional outreach attempts will be made to reach the patient to complete the Transitions of Care (Post Inpatient/ED visit) call.   Signature  Robyne Peers, RN

## 2024-02-17 ENCOUNTER — Telehealth: Payer: Self-pay

## 2024-02-17 NOTE — Transitions of Care (Post Inpatient/ED Visit) (Signed)
   02/17/2024  Name: Kevin [name] MRN: [id number] DOB: 20-Jan-1951  Today's TOC FU Call Status: Today's TOC FU Call Status:: Unsuccessful Call (2nd Attempt) Unsuccessful Call (2nd Attempt) Date: 02/17/24  Attempted to reach the patient regarding the most recent Inpatient/ED visit.  Follow Up Plan: Additional outreach attempts will be made to reach the patient to complete the Transitions of Care (Post Inpatient/ED visit) call.   Garlen Reinig A. Mliss Fritz RN, BA, [facility], CRRN [facility] RN Care Manager, Transition of Care [phone]

## 2024-02-18 ENCOUNTER — Telehealth: Payer: Self-pay

## 2024-02-18 NOTE — Transitions of Care (Post Inpatient/ED Visit) (Signed)
   02/18/2024  Name: Kevin [name] MRN: [id number] DOB: 10-23-1951  Today's TOC FU Call Status: Today's TOC FU Call Status:: Unsuccessful Call (3rd Attempt) Unsuccessful Call (3rd Attempt) Date: 02/18/24  Attempted to reach the patient regarding the most recent Inpatient/ED visit.  Follow Up Plan: No further outreach attempts will be made at this time. We have been unable to contact the patient.  Mayes Sangiovanni A. Mliss Fritz RN, BA, [facility], CRRN [facility] RN Care Manager, Transition of Care [phone]

## 2024-02-24 ENCOUNTER — Encounter: Payer: Self-pay | Admitting: Internal Medicine

## 2024-02-24 ENCOUNTER — Other Ambulatory Visit (HOSPITAL_COMMUNITY): Payer: Self-pay

## 2024-02-24 ENCOUNTER — Ambulatory Visit: Payer: Medicare (Managed Care) | Attending: Internal Medicine | Admitting: Internal Medicine

## 2024-02-24 ENCOUNTER — Other Ambulatory Visit: Payer: Self-pay

## 2024-02-24 VITALS — BP 147/70 | HR 64 | Ht 62.0 in | Wt 126.0 lb

## 2024-02-24 DIAGNOSIS — E119 Type 2 diabetes mellitus without complications: Secondary | ICD-10-CM

## 2024-02-24 DIAGNOSIS — Z794 Long term (current) use of insulin: Secondary | ICD-10-CM

## 2024-02-24 DIAGNOSIS — N1330 Unspecified hydronephrosis: Secondary | ICD-10-CM

## 2024-02-24 DIAGNOSIS — E1165 Type 2 diabetes mellitus with hyperglycemia: Secondary | ICD-10-CM

## 2024-02-24 DIAGNOSIS — I10 Essential (primary) hypertension: Secondary | ICD-10-CM | POA: Diagnosis not present

## 2024-02-24 DIAGNOSIS — Z7984 Long term (current) use of oral hypoglycemic drugs: Secondary | ICD-10-CM

## 2024-02-24 DIAGNOSIS — R339 Retention of urine, unspecified: Secondary | ICD-10-CM

## 2024-02-24 DIAGNOSIS — Z09 Encounter for follow-up examination after completed treatment for conditions other than malignant neoplasm: Secondary | ICD-10-CM

## 2024-02-24 DIAGNOSIS — D649 Anemia, unspecified: Secondary | ICD-10-CM

## 2024-02-24 DIAGNOSIS — Z1211 Encounter for screening for malignant neoplasm of colon: Secondary | ICD-10-CM

## 2024-02-24 DIAGNOSIS — I152 Hypertension secondary to endocrine disorders: Secondary | ICD-10-CM

## 2024-02-24 DIAGNOSIS — E042 Nontoxic multinodular goiter: Secondary | ICD-10-CM

## 2024-02-24 DIAGNOSIS — E1169 Type 2 diabetes mellitus with other specified complication: Secondary | ICD-10-CM

## 2024-02-24 DIAGNOSIS — E1159 Type 2 diabetes mellitus with other circulatory complications: Secondary | ICD-10-CM

## 2024-02-24 DIAGNOSIS — Z23 Encounter for immunization: Secondary | ICD-10-CM

## 2024-02-24 DIAGNOSIS — C49A2 Gastrointestinal stromal tumor of stomach: Secondary | ICD-10-CM

## 2024-02-24 DIAGNOSIS — K769 Liver disease, unspecified: Secondary | ICD-10-CM

## 2024-02-24 MED ORDER — ONETOUCH VERIO FLEX SYSTEM W/DEVICE KIT
PACK | 0 refills | Status: AC
  Filled 2024-02-24: qty 1, 1d supply, fill #0
  Filled 2024-02-25 – 2024-02-27 (×2): qty 1, 30d supply, fill #0

## 2024-02-24 MED ORDER — ONETOUCH DELICA PLUS LANCET33G MISC
12 refills | Status: AC
  Filled 2024-02-24 – 2024-02-25 (×2): qty 100, 50d supply, fill #0
  Filled 2024-04-19 – 2024-04-30 (×2): qty 100, 50d supply, fill #1

## 2024-02-24 MED ORDER — METFORMIN HCL 500 MG PO TABS
500.0000 mg | ORAL_TABLET | Freq: Every day | ORAL | 1 refills | Status: DC
  Filled 2024-02-24: qty 90, 90d supply, fill #0

## 2024-02-24 MED ORDER — TAMSULOSIN HCL 0.4 MG PO CAPS
0.8000 mg | ORAL_CAPSULE | Freq: Every day | ORAL | 1 refills | Status: AC
  Filled 2024-02-24 – 2024-02-25 (×2): qty 60, 30d supply, fill #0
  Filled 2024-04-19 – 2024-04-30 (×2): qty 60, 30d supply, fill #1

## 2024-02-24 MED ORDER — ZOSTER VAC RECOMB ADJUVANTED 50 MCG/0.5ML IM SUSR: 0.5000 mL | Freq: Once | INTRAMUSCULAR | 0 refills | Status: AC

## 2024-02-24 MED ORDER — ACCU-CHEK GUIDE TEST VI STRP
ORAL_STRIP | 12 refills | Status: AC
  Filled 2024-02-24 – 2024-02-25 (×2): qty 100, 50d supply, fill #0
  Filled 2024-04-19 – 2024-04-30 (×2): qty 100, 50d supply, fill #1

## 2024-02-24 MED ORDER — LOSARTAN POTASSIUM 25 MG PO TABS
25.0000 mg | ORAL_TABLET | Freq: Every day | ORAL | 1 refills | Status: AC
  Filled 2024-02-24 (×2): qty 90, 90d supply, fill #0

## 2024-02-24 NOTE — Progress Notes (Signed)
 Patient ID: [name], male    DOB: [birth year]  MRN: [id number]  CC: Hospitalization Follow-up (Hospitalization f/u. Kevin [name] about medication causing fainting - unsure which med/Requesting BS meter, lancets & strips/Yes to Tdap vax)   Subjective: Kevin [name] is a 72 y.o. male who presents for hospital follow-up. Wife, Kevin [name], is with him. His concerns today include:  Patient with history of HTN, IDA, DM type II, former tobacco, GIST, chronic low potassium  AMN Language interpreter used during this encounter. #Kevin [name] [id number]  Patient was hospitalized 2/28-02/13/2024 after an unwitnessed fall at home and problems voiding.  He was found to have blood sugar of 600 on admission and was given IV fluids and insulin.  A1c was 15.  Discharged on metformin 500 mg twice a day and Lantus insulin 30 units daily.   -Found to have hypertensive emergency.  Lisinopril stopped during hospitalization.  Discharged on amlodipine 5 mg daily. 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  Found to have bilateral hydronephrosis.  Foley catheter left in place.  Placed on Flomax. -Found to have thyroid nodules with a prominent one measuring 3.1 cm in the right lobe as incidental finding on CT of the chest.  Will need thyroid ultrasound. -Hemoglobin prior to admission was 13.  At the time of discharge was 10. -History of GIST.  Lost to follow-up with his oncologist Dr. Truett [name] and noncompliant with Gleevac.  MRI of the liver showed a 2 x 1.4 cm lesion in the dome and a smaller lesion seen.  Concerning for malignancy or metastasis.  Today: DM/HTN: Reports compliance with taking Lantus insulin 30 units daily.  He tells me that he stopped taking "the 2 white pills" which he pointed out to be metformin and amlodipine.  States he felt dizzy when he took them. -Not checking blood sugars.  Needs device/testing supplies.  Was prescribed continuous glucose  monitor sensor on hospital discharge but does not have any smart phone.  Urinary retention: Still has Foley catheter in place.  Does not have appointment with urologist.  Wife states she was supposed to call to schedule the appointment but has not gotten around to it.  Taking Flomax as prescribed 0.4 mg 2 tabs daily.  GIST/Liver Lesion: Has appointment scheduled 03/02/2024 with oncology Dr. Prentiss [name].  HM: Declines Tdap.  Agreeable to receiving shingles vaccine.  Due for diabetic eye exam.   Patient Active Problem List   Diagnosis Date Noted  . Type 2 diabetes mellitus with hyperosmolar hyperglycemic state (HHS) (HCC) 02/06/2024  . Insulin dependent type 2 diabetes mellitus (HCC) 02/06/2024  . Hyperosmolar hyponatremia 02/06/2024  . Hypertensive emergency 02/06/2024  . BPH (benign prostatic hyperplasia) 02/06/2024  . Bilateral hydronephrosis 02/06/2024  . 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 (HCC) 02/06/2024  . GERD (gastroesophageal reflux disease) 02/06/2024  . Thyroid nodule 02/06/2024  . History of fall 02/06/2024  . Sepsis (HCC) 06/06/2022  . Generalized weakness 06/06/2022  . Chronic anemia 06/06/2022  . Gastrointestinal stromal tumor (GIST) (HCC) 05/29/2022  . SIRS (systemic inflammatory response syndrome) (HCC) 04/17/2022  . Lymphocytopenia 04/17/2022  . Positive blood culture 04/17/2022  . Hypocalcemia 04/15/2022  . Elevated troponin 04/15/2022  . Essential hypertension 05/11/2021  . 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 DNR (do not resuscitate)   . Palliative care by specialist   . Protein-calorie malnutrition, severe 01/31/2020  . Weight loss, unintentional 01/24/2020  .  Tobacco abuse 01/24/2020  . Gastric mass 01/22/2020  . Normocytic anemia 01/21/2020  . Mass of stomach 01/21/2020     Current Outpatient Medications on File Prior to Visit  Medication Sig Dispense Refill  . insulin glargine (LANTUS  SOLOSTAR) 100 UNIT/ML Solostar Pen Inject 30 Units into the skin daily. 15 mL 0  . metFORMIN (GLUCOPHAGE) 500 MG tablet Take 1 tablet (500 mg total) by mouth 2 (two) times daily with a meal. 60 tablet 0  . Continuous Glucose Sensor (FREESTYLE LIBRE 3 SENSOR) MISC Use to check blood sugar continuously throughout the day. Change sensors once every 14 day. (Patient not taking: Reported on 02/06/2024) 2 each 3  . glucose blood (ONETOUCH VERIO) test strip Use to check blood sugar once daily. Please schedule visit with Dr. Laural [name]. (Patient not taking: Reported on 02/06/2024) 100 each 0  . imatinib (GLEEVEC) 100 MG tablet Take 2 tablets (200 mg total) by mouth daily. Take with meals and large glass of water.Caution:Chemotherapy (Patient not taking: Reported on 02/06/2024) 60 tablet 1  . Insulin Pen Needle 31G X 5 MM MISC Use with insulin daily. (Patient not taking: Reported on 02/24/2024) 100 each 1  . tamsulosin (FLOMAX) 0.4 MG CAPS capsule Take 2 capsules (0.8 mg total) by mouth daily after supper. (Patient not taking: Reported on 02/24/2024) 30 capsule 0   No current facility-administered medications on file prior to visit.    No Known Allergies  Social History   Socioeconomic History  . Marital status: Married    Spouse name: Not on file  . Number of children: 2  . Years of education: Not on file  . Highest education level: 6th grade  Occupational History  . 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 Smoking status: Former    Current packs/day: 0.00    Types: Cigarettes    Quit date: 1998    Years since quitting: 27.2  . Smokeless tobacco: Never  . Tobacco comments:    01/27/2020 - Pt denied smoking cigarettes  Vaping Use  . Vaping status: Never Used  Substance and Sexual Activity  . Alcohol use: Not Currently  . Drug use: Not Currently  . Sexual activity: Not on file  Other Topics Concern  . Not on file  Social History Narrative  . Not on file   Social Drivers of  Health   Financial Resource Strain: Not on file  Food Insecurity: No Food Insecurity (02/06/2024)   Hunger Vital Sign   . Worried About Programme researcher, broadcasting/film/video in the Last Year: Never true   . Ran Out of Food in the Last Year: Never true  Transportation Needs: No Transportation Needs (02/06/2024)   PRAPARE - Transportation   . Lack of Transportation (Medical): No   . Lack of Transportation (Non-Medical): No  Physical Activity: Not on file  Stress: Not on file  Social Connections: Moderately Integrated (02/06/2024)   Social Connection and Isolation Panel [NHANES]   . Frequency of Communication with Friends and Family: Three times a week   . Frequency of Social Gatherings with Friends and Family: Three times a week   . Attends Religious Services: Never   . Active Member of Clubs or Organizations: Yes   . Attends Banker Meetings: Never   . Marital Status: Married  Catering manager Violence: Not At Risk (02/06/2024)   Humiliation, Afraid, Rape, and Kick questionnaire   . Fear of Current or Ex-Partner: No   . Emotionally Abused: No   .  Physically Abused: No   . Sexually Abused: No    Family History  Problem Relation Age of Onset  . Hypertension Other     Past Surgical History:  Procedure Laterality Date  . BIOPSY  01/24/2020   Procedure: BIOPSY;  Surgeon: Graylin Shiver, MD;  Location: [facility] ENDOSCOPY;  Service: Endoscopy;;  . ESOPHAGOGASTRODUODENOSCOPY (EGD) WITH PROPOFOL N/A 01/24/2020   Procedure: ESOPHAGOGASTRODUODENOSCOPY (EGD) WITH PROPOFOL;  Surgeon: Graylin Shiver, MD;  Location: [facility] ENDOSCOPY;  Service: Endoscopy;  Laterality: N/A;  . IR CATHETER TUBE CHANGE  02/18/2020  . IR RADIOLOGIST EVAL & MGMT  02/15/2020  . IR RADIOLOGIST EVAL & MGMT  03/09/2020  . LAPAROSCOPY N/A 05/29/2022   Procedure: LAPAROSCOPY DIAGNOSTIC;  Surgeon: Fritzi Mandes, MD;  Location: [facility] OR;  Service: General;  Laterality: N/A;  . PARTIAL GASTRECTOMY N/A 05/29/2022   Procedure: OPEN PARTIAL  GASTRECTOMY;  Surgeon: Fritzi Mandes, MD;  Location[facility] OR;  Service: General;  Laterality: N/A;  . SCLEROTHERAPY  01/24/2020   Procedure: SCLEROTHERAPY;  Surgeon: Graylin Shiver, MD;  Location: [facility] END[facility]COPY;  Service: Endoscopy;;  . SPLENECTOMY, TOTAL N/A 05/29/2022   Procedure: SPLENECTOMY;  Surgeon: Fritzi Mandes, MD;  Location: [facility] OR;  Service: General;  Laterality: N/A;    ROS: Review of Systems Negative except as stated above  PHYSICAL EXAM: BP (!) 147/70   Pulse 64   Ht 5\' 2"  (1.575 m)   Wt 126 lb (57.2 kg)   SpO2 100%   BMI 23.05 kg/m   Physical Exam   General appearance -pleasant older male in NAD Mental status -trend of thought is slow. Mouth - mucous membranes moist, pharynx normal without lesions Neck - supple, no significant adenopathy.  No thyroid enlargement.  No thyroid nodules felt. Chest - clear to auscultation, no wheezes, rales or rhonchi, symmetric air entry Heart - normal rate, regular rhythm, normal S1, S2, no murmurs, rubs, clicks or gallops Extremities - peripheral pulses normal, no pedal edema, no clubbing or cyanosis     Latest Ref Rng & Units 02/12/2024    6:54 AM 02/10/2024    5:02 AM 02/08/2024    3:11 AM  CMP  Glucose 70 - 99 mg/dL 782  956  213   BUN 8 - 23 mg/dL 19  36  41   Creatinine 0.61 - 1.24 mg/dL 0.86  5.78  4.69   Sodium 135 - 145 mmol/L 134  135  130   Potassium 3.5 - 5.1 mmol/L 3.7  3.5  3.9   Chloride 98 - 111 mmol/L 102  102  97   CO2 22 - 32 mmol/L 25  27  24    Calcium 8.9 - 10.3 mg/dL 8.1  8.1  8.9   Total Protein 6.5 - 8.1 g/dL  5.8  6.3   Total Bilirubin 0.0 - 1.2 mg/dL  0.3  0.7   Alkaline Phos 38 - 126 U/L  106  100   AST 15 - 41 U/L  28  19   ALT 0 - 44 U/L  16  15    Lipid Panel  No results found for: "CHOL", "TRIG", "HDL", "CHOLHDL", "VLDL", "LDLCALC", "LDLDIRECT"  CBC    Component Value Date/Time   WBC 7.5 02/10/2024 0502   RBC 3.11 (L) 02/10/2024 0502   HGB 10.0 (L) 02/10/2024 0502   HGB 13.0 04/03/2023  1119   HCT 29.4 (L) 02/10/2024 0502   PLT 240 02/10/2024 0502   PLT 338 04/03/2023 1119  MCV 94.5 02/10/2024 0502   MCH 32.2 02/10/2024 0502   MCHC 34.0 02/10/2024 0502   RDW 14.2 02/10/2024 0502   LYMPHSABS 2.6 02/10/2024 0502   MONOABS 1.1 (H) 02/10/2024 0502   EOSABS 0.3 02/10/2024 0502   BASOSABS 0.0 02/10/2024 0502    ASSESSMENT AND PLAN: 1. Hospital discharge follow-up (Primary)   2. Type 2 diabetes mellitus with other specified complication, with long-term current use of insulin (HCC) Uncontrolled.  Not very compliant with follow-up.  Advised to continue Lantus insulin 30 units.  Decrease metformin to 500 mg once a day. Prescription sent for diabetic testing supplies.  Advised to check blood sugars at least twice a day before meals and record the readings.  Bring readings with him when he comes to see the clinical pharmacist.  Encourage healthy eating habits. - Ambulatory referral to Ophthalmology  3. Diabetes mellitus treated with oral medication (HCC) See #2 above.  4. Hypertension associated with type 2 diabetes mellitus (HCC) Not at goal.  Patient reluctant to restart amlodipine.  We will discontinue it and have him start Cozaar instead. - Basic Metabolic Panel - losartan (COZAAR) 25 MG tablet; Take 1 tablet (25 mg total) by mouth daily.  Dispense: 90 tablet; Refill: 1  5. Urinary retention Referral submitted to urology.  Needs to be seen soon to give voiding trial to determine if catheter can be removed.  Continue Flomax.  Advised that Flomax can cause dizziness with sudden position changes.  Advised to go slow when getting up from bed at night and when getting up from sitting to standing position. - Ambulatory referral to Urology  6. Hydronephrosis determined by ultrasound - Ambulatory referral to Urology  7. Thyroid nodule Thyroid ultrasound ordered.  8. Chronic anemia Recheck CBC today.  Hemoglobin at the time of discharge was 10 but this may have been  dilutional. - CBC  9. 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 Liver lesion Patient to keep upcoming appointment with the oncologist.  I will leave it up to his oncologist to determine whether he needs biopsy of the lesion in the liver.  11. Need for shingles vaccine - Zoster Vaccine Adjuvanted [facility]) injection; Inject 0.5 mLs into the muscle once for 1 dose.  Dispense: 0.5 mL; Refill: 0  12. Screening for colon cancer GI referral for colon cancer screening and also evaluation of liver lesion.  Addendum: Patient with persistent anemia.  Iron studies added.  Patient was given the opportunity to ask questions.  Patient verbalized understanding of the plan and was able to repeat key elements of the plan.   This documentation was completed using Paediatric nurse.  Any transcriptional errors are unintentional.  Orders Placed This Encounter  Procedures  . CBC  . Basic Metabolic Panel  . Ambulatory referral to Urology  . Ambulatory referral to Ophthalmology     Requested Prescriptions   Signed Prescriptions Disp Refills  . Zoster Vaccine Adjuvanted [facility]) injection 0.5 mL 0    Sig: Inject 0.5 mLs into the muscle once for 1 dose.  . losartan (COZAAR) 25 MG tablet 90 tablet 1    Sig: Take 1 tablet (25 mg total) by mouth daily.    Return in about 4 months (around 06/25/2024) for 3 weeks with clinical pharmacist for DM.  Jonah Blue, MD, FACP

## 2024-02-24 NOTE — Progress Notes (Incomplete Revision)
 Patient ID: Kevin [name], male    DOB: Feb 26, 1951  MRN: [id number]  CC: Hospitalization Follow-up (Hospitalization f/u. Denton Meek about medication causing fainting - unsure which med/Requesting BS meter, lancets & strips/Yes to Tdap vax)   Subjective: Kevin [name] is a 73 y.o. male who presents for hospital follow-up. Wife, Kevin [name], is with him. His concerns today include:  Patient with history of HTN, IDA, DM type II, former tobacco, GIST, chronic low potassium  AMN Language interpreter used during this encounter. #Kevin [name] [id number]  Patient was hospitalized 2/28-02/13/2024 after an unwitnessed fall at home and problems voiding.  He was found to have blood sugar of 600 on admission and was given IV fluids and insulin.  A1c was 15.  Discharged on metformin 500 mg twice a day and Lantus insulin 30 units daily.   -Found to have hypertensive emergency.  Lisinopril stopped during hospitalization.  Discharged on amlodipine 5 mg daily. 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  Found to have bilateral hydronephrosis.  Foley catheter left in place.  Placed on Flomax. -Found to have thyroid nodules with a prominent one measuring 3.1 cm in the right lobe as incidental finding on CT of the chest.  Will need thyroid ultrasound. -Hemoglobin prior to admission was 13.  At the time of discharge was 10. -History of GIST.  Lost to follow-up with his oncologist Dr. Truett [name] and noncompliant with Gleevac.  MRI of the liver showed a 2 x 1.4 cm lesion in the dome and a smaller lesion seen.  Concerning for malignancy or metastasis.  Today: DM/HTN: Reports compliance with taking Lantus insulin 30 units daily.  He tells me that he stopped taking "the 2 white pills" which he pointed out to be metformin and amlodipine.  States he felt dizzy when he took them. -Not checking blood sugars.  Needs device/testing supplies.  Was prescribed continuous glucose  monitor sensor on hospital discharge but does not have any smart phone.  Urinary retention: Still has Foley catheter in place.  Does not have appointment with urologist.  Wife states she was supposed to call to schedule the appointment but has not gotten around to it.  Taking Flomax as prescribed 0.4 mg 2 tabs daily.  GIST/Liver Lesion: Has appointment scheduled 03/02/2024 with oncology Dr. Prentiss [name].  HM: Declines Tdap.  Agreeable to receiving shingles vaccine.  Due for diabetic eye exam.   Patient Active Problem List   Diagnosis Date Noted  . Type 2 diabetes mellitus with hyperosmolar hyperglycemic state (HHS) (HCC) 02/06/2024  . Insulin dependent type 2 diabetes mellitus (HCC) 02/06/2024  . Hyperosmolar hyponatremia 02/06/2024  . Hypertensive emergency 02/06/2024  . BPH (benign prostatic hyperplasia) 02/06/2024  . Bilateral hydronephrosis 02/06/2024  . 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 (HCC) 02/06/2024  . GERD (gastroesophageal reflux disease) 02/06/2024  . Thyroid nodule 02/06/2024  . History of fall 02/06/2024  . Sepsis (HCC) 06/06/2022  . Generalized weakness 06/06/2022  . Chronic anemia 06/06/2022  . Gastrointestinal stromal tumor (GIST) (HCC) 05/29/2022  . SIRS (systemic inflammatory response syndrome) (HCC) 04/17/2022  . Lymphocytopenia 04/17/2022  . Positive blood culture 04/17/2022  . Hypocalcemia 04/15/2022  . Elevated troponin 04/15/2022  . Essential hypertension 05/11/2021  . 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 DNR (do not resuscitate)   . Palliative care by specialist   . Protein-calorie malnutrition, severe 01/31/2020  . Weight loss, unintentional 01/24/2020  .  Tobacco abuse 01/24/2020  . Gastric mass 01/22/2020  . Normocytic anemia 01/21/2020  . Mass of stomach 01/21/2020     Current Outpatient Medications on File Prior to Visit  Medication Sig Dispense Refill  . insulin glargine (LANTUS  SOLOSTAR) 100 UNIT/ML Solostar Pen Inject 30 Units into the skin daily. 15 mL 0  . metFORMIN (GLUCOPHAGE) 500 MG tablet Take 1 tablet (500 mg total) by mouth 2 (two) times daily with a meal. 60 tablet 0  . Continuous Glucose Sensor (FREESTYLE LIBRE 3 SENSOR) MISC Use to check blood sugar continuously throughout the day. Change sensors once every 14 day. (Patient not taking: Reported on 02/06/2024) 2 each 3  . glucose blood (ONETOUCH VERIO) test strip Use to check blood sugar once daily. Please schedule visit with Dr. Laural Benes. (Patient not taking: Reported on 02/06/2024) 100 each 0  . imatinib (GLEEVEC) 100 MG tablet Take 2 tablets (200 mg total) by mouth daily. Take with meals and large glass of water.Caution:Chemotherapy (Patient not taking: Reported on 02/06/2024) 60 tablet 1  . Insulin Pen Needle 31G X 5 MM MISC Use with insulin daily. (Patient not taking: Reported on 02/24/2024) 100 each 1  . tamsulosin (FLOMAX) 0.4 MG CAPS capsule Take 2 capsules (0.8 mg total) by mouth daily after supper. (Patient not taking: Reported on 02/24/2024) 30 capsule 0   No current facility-administered medications on file prior to visit.    No Known Allergies  Social History   Socioeconomic History  . Marital status: Married    Spouse name: Not on file  . Number of children: 2  . Years of education: Not on file  . Highest education level: 6th grade  Occupational History  . 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 Smoking status: Former    Current packs/day: 0.00    Types: Cigarettes    Quit date: 1998    Years since quitting: 27.2  . Smokeless tobacco: Never  . Tobacco comments:    01/27/2020 - Pt denied smoking cigarettes  Vaping Use  . Vaping status: Never Used  Substance and Sexual Activity  . Alcohol use: Not Currently  . Drug use: Not Currently  . Sexual activity: Not on file  Other Topics Concern  . Not on file  Social History Narrative  . Not on file   Social Drivers of  Health   Financial Resource Strain: Not on file  Food Insecurity: No Food Insecurity (02/06/2024)   Hunger Vital Sign   . Worried About Programme researcher, broadcasting/film/video in the Last Year: Never true   . Ran Out of Food in the Last Year: Never true  Transportation Needs: No Transportation Needs (02/06/2024)   PRAPARE - Transportation   . Lack of Transportation (Medical): No   . Lack of Transportation (Non-Medical): No  Physical Activity: Not on file  Stress: Not on file  Social Connections: Moderately Integrated (02/06/2024)   Social Connection and Isolation Panel [NHANES]   . Frequency of Communication with Friends and Family: Three times a week   . Frequency of Social Gatherings with Friends and Family: Three times a week   . Attends Religious Services: Never   . Active Member of Clubs or Organizations: Yes   . Attends Banker Meetings: Never   . Marital Status: Married  Catering manager Violence: Not At Risk (02/06/2024)   Humiliation, Afraid, Rape, and Kick questionnaire   . Fear of Current or Ex-Partner: No   . Emotionally Abused: No   .  Physically Abused: No   . Sexually Abused: No    Family History  Problem Relation Age of Onset  . Hypertension Other     Past Surgical History:  Procedure Laterality Date  . BIOPSY  01/24/2020   Procedure: BIOPSY;  Surgeon: Graylin Shiver, MD;  [facility];  Service: Endoscopy;;  . ESOPHAGOGASTRODUODENOSCOPY (EGD) WITH PROPOFOL N/A 01/24/2020   Procedure: ESOPHAGOGASTRODUODENOSCOPY (EGD) WITH PROPOFOL;  Surgeon: Graylin Shiver, MD;  Location: [facility] ENDOSCOPY;  Service: Endoscopy;  Laterality: N/A;  . IR CATHETER TUBE CHANGE  02/18/2020  . IR RADIOLOGIST EVAL & MGMT  02/15/2020  . IR RADIOLOGIST EVAL & MGMT  03/09/2020  . LAPAROSCOPY N/A 05/29/2022   Procedure: LAPAROSCOPY DIAGNOSTIC;  Surgeon: Fritzi Mandes, MD;  Location: [facility] OR;  Service: General;  Laterality: N/A;  . PARTIAL GASTRECTOMY N/A 05/29/2022   Procedure: OPEN PARTIAL  GASTRECTOMY;  Surgeon: Fritzi Mandes, MD;  Location: [facility] OR;  Service: General;  Laterality: N/A;  . SCLEROTHERAPY  01/24/2020   Procedure: SCLEROTHERAPY;  Surgeon: Graylin Shiver, MD;  [facility] END[facility]COPY;  Service: Endoscopy;;  . SPLENECTOMY, TOTAL N/A 05/29/2022   Procedure: SPLENECTOMY;  Surgeon: Fritzi Mandes, MD;  Location: [facility] OR;  Service: General;  Laterality: N/A;    ROS: Review of Systems Negative except as stated above  PHYSICAL EXAM: BP (!) 147/70   Pulse 64   Ht 5\' 2"  (1.575 m)   Wt 126 lb (57.2 kg)   SpO2 100%   BMI 23.05 kg/m   Physical Exam   General appearance -pleasant older male in NAD Mental status -trend of thought is slow. Mouth - mucous membranes moist, pharynx normal without lesions Neck - supple, no significant adenopathy.  No thyroid enlargement.  No thyroid nodules felt. Chest - clear to auscultation, no wheezes, rales or rhonchi, symmetric air entry Heart - normal rate, regular rhythm, normal S1, S2, no murmurs, rubs, clicks or gallops Extremities - peripheral pulses normal, no pedal edema, no clubbing or cyanosis     Latest Ref Rng & Units 02/12/2024    6:54 AM 02/10/2024    5:02 AM 02/08/2024    3:11 AM  CMP  Glucose 70 - 99 mg/dL 130  865  784   BUN 8 - 23 mg/dL 19  36  41   Creatinine 0.61 - 1.24 mg/dL 6.96  2.95  2.84   Sodium 135 - 145 mmol/L 134  135  130   Potassium 3.5 - 5.1 mmol/L 3.7  3.5  3.9   Chloride 98 - 111 mmol/L 102  102  97   CO2 22 - 32 mmol/L 25  27  24    Calcium 8.9 - 10.3 mg/dL 8.1  8.1  8.9   Total Protein 6.5 - 8.1 g/dL  5.8  6.3   Total Bilirubin 0.0 - 1.2 mg/dL  0.3  0.7   Alkaline Phos 38 - 126 U/L  106  100   AST 15 - 41 U/L  28  19   ALT 0 - 44 U/L  16  15    Lipid Panel  No results found for: "CHOL", "TRIG", "HDL", "CHOLHDL", "VLDL", "LDLCALC", "LDLDIRECT"  CBC    Component Value Date/Time   WBC 7.5 02/10/2024 0502   RBC 3.11 (L) 02/10/2024 0502   HGB 10.0 (L) 02/10/2024 0502   HGB 13.0 04/03/2023  1119   HCT 29.4 (L) 02/10/2024 0502   PLT 240 02/10/2024 0502   PLT 338 04/03/2023 1119  MCV 94.5 02/10/2024 0502   MCH 32.2 02/10/2024 0502   MCHC 34.0 02/10/2024 0502   RDW 14.2 02/10/2024 0502   LYMPHSABS 2.6 02/10/2024 0502   MONOABS 1.1 (H) 02/10/2024 0502   EOSABS 0.3 02/10/2024 0502   BASOSABS 0.0 02/10/2024 0502    ASSESSMENT AND PLAN: 1. Hospital discharge follow-up (Primary)   2. Type 2 diabetes mellitus with other specified complication, with long-term current use of insulin (HCC) Uncontrolled.  Not very compliant with follow-up.  Advised to continue Lantus insulin 30 units.  Decrease metformin to 500 mg once a day. Prescription sent for diabetic testing supplies.  Advised to check blood sugars at least twice a day before meals and record the readings.  Bring readings with him when he comes to see the clinical pharmacist.  Encourage healthy eating habits. - Ambulatory referral to Ophthalmology  3. Diabetes mellitus treated with oral medication (HCC) See #2 above.  4. Hypertension associated with type 2 diabetes mellitus (HCC) Not at goal.  Patient reluctant to restart amlodipine.  We will discontinue it and have him start Cozaar instead. - Basic Metabolic Panel - losartan (COZAAR) 25 MG tablet; Take 1 tablet (25 mg total) by mouth daily.  Dispense: 90 tablet; Refill: 1  5. Urinary retention Referral submitted to urology.  Needs to be seen soon to give voiding trial to determine if catheter can be removed.  Continue Flomax.  Advised that Flomax can cause dizziness with sudden position changes.  Advised to go slow when getting up from bed at night and when getting up from sitting to standing position. - Ambulatory referral to Urology  6. Hydronephrosis determined by ultrasound - Ambulatory referral to Urology  7. Thyroid nodule Thyroid ultrasound ordered.  8. Chronic anemia Recheck CBC today.  Hemoglobin at the time of discharge was 10 but this may have been  dilutional. - CBC  9. 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 Liver lesion Patient to keep upcoming appointment with the oncologist.  I will leave it up to his oncologist to determine whether he needs biopsy of the lesion in the liver.  11. Need for shingles vaccine - Zoster Vaccine Adjuvanted [facility]) injection; Inject 0.5 mLs into the muscle once for 1 dose.  Dispense: 0.5 mL; Refill: 0  12. Screening for colon cancer GI referral for colon cancer screening and also evaluation of liver lesion.  Addendum: Patient with persistent anemia.  Iron studies added.  Patient was given the opportunity to ask questions.  Patient verbalized understanding of the plan and was able to repeat key elements of the plan.   This documentation was completed using Paediatric nurse.  Any transcriptional errors are unintentional.  Orders Placed This Encounter  Procedures  . CBC  . Basic Metabolic Panel  . Ambulatory referral to Urology  . Ambulatory referral to Ophthalmology     Requested Prescriptions   Signed Prescriptions Disp Refills  . Zoster Vaccine Adjuvanted [facility]) injection 0.5 mL 0    Sig: Inject 0.5 mLs into the muscle once for 1 dose.  . losartan (COZAAR) 25 MG tablet 90 tablet 1    Sig: Take 1 tablet (25 mg total) by mouth daily.    Return in about 4 months (around 06/25/2024) for 3 weeks with clinical pharmacist for DM.  Jonah Blue, MD, FACP

## 2024-02-24 NOTE — Patient Instructions (Signed)
 Tried to check your blood sugars at least twice a day before breakfast and before dinner.  I will send diabetic testing supplies to your pharmacy.  Stop amlodipine. Start a blood pressure medicine called losartan 25 mg daily instead.  Continue Lantus insulin 30 units daily.  Decrease metformin to 500 mg 1 tablet daily.  I have submitted a referral for you to see the urologist.  Please go slow with position changes.  Stay hydrated.  Try to drink at least 4 to 6 glasses of water daily.

## 2024-02-25 ENCOUNTER — Other Ambulatory Visit (HOSPITAL_COMMUNITY): Payer: Self-pay

## 2024-02-25 ENCOUNTER — Other Ambulatory Visit: Payer: Self-pay

## 2024-02-25 LAB — BASIC METABOLIC PANEL
BUN/Creatinine Ratio: 16 (ref 10–24)
BUN: 13 mg/dL (ref 8–27)
CO2: 21 mmol/L (ref 20–29)
Calcium: 8.8 mg/dL (ref 8.6–10.2)
Chloride: 100 mmol/L (ref 96–106)
Creatinine, Ser: 0.79 mg/dL (ref 0.76–1.27)
Glucose: 169 mg/dL — ABNORMAL HIGH (ref 70–99)
Potassium: 4.1 mmol/L (ref 3.5–5.2)
Sodium: 138 mmol/L (ref 134–144)
eGFR: 94 mL/min/{1.73_m2} (ref >59)

## 2024-02-25 LAB — CBC
Hematocrit: 30.8 % — ABNORMAL LOW (ref 37.5–51.0)
Hemoglobin: 10.3 g/dL — ABNORMAL LOW (ref 13.0–17.7)
MCH: 31.9 pg (ref 26.6–33.0)
MCHC: 33.4 g/dL (ref 31.5–35.7)
MCV: 95 fL (ref 79–97)
Platelets: 578 10*3/uL — ABNORMAL HIGH (ref 150–450)
RBC: 3.23 x10E6/uL — ABNORMAL LOW (ref 4.14–5.80)
RDW: 12.9 % (ref 11.6–15.4)
WBC: 7 10*3/uL (ref 3.4–10.8)

## 2024-02-25 NOTE — Addendum Note (Signed)
 Addended by: Jonah Blue B on: 02/25/2024 02:04 PM   Modules accepted: Orders

## 2024-02-26 ENCOUNTER — Other Ambulatory Visit (HOSPITAL_COMMUNITY): Payer: Self-pay

## 2024-02-26 LAB — IRON,TIBC AND FERRITIN PANEL
Ferritin: 600 ng/mL — ABNORMAL HIGH (ref 30–400)
Iron Saturation: 38 % (ref 15–55)
Iron: 107 ug/dL (ref 38–169)
Total Iron Binding Capacity: 285 ug/dL (ref 250–450)
UIBC: 178 ug/dL (ref 111–343)

## 2024-02-26 LAB — SPECIMEN STATUS REPORT

## 2024-02-26 MED ORDER — ZOSTER VAC RECOMB ADJUVANTED 50 MCG/0.5ML IM SUSR
0.5000 mL | Freq: Once | INTRAMUSCULAR | 1 refills | Status: AC
  Filled 2024-02-26: qty 0.5, 1d supply, fill #0

## 2024-02-27 ENCOUNTER — Other Ambulatory Visit: Payer: Self-pay

## 2024-02-27 ENCOUNTER — Other Ambulatory Visit (HOSPITAL_COMMUNITY): Payer: Self-pay

## 2024-02-27 MED ORDER — TAMSULOSIN HCL 0.4 MG PO CAPS: 0.8000 mg | ORAL_CAPSULE | Freq: Every day | ORAL | 3 refills | Status: AC

## 2024-03-05 ENCOUNTER — Ambulatory Visit: Payer: Medicare (Managed Care) | Admitting: Nurse Practitioner

## 2024-03-25 ENCOUNTER — Other Ambulatory Visit (HOSPITAL_COMMUNITY): Payer: Self-pay

## 2024-04-02 ENCOUNTER — Inpatient Hospital Stay: Payer: Medicare (Managed Care) | Attending: Oncology

## 2024-04-02 ENCOUNTER — Inpatient Hospital Stay: Payer: Medicare (Managed Care) | Admitting: Oncology

## 2024-04-02 ENCOUNTER — Ambulatory Visit: Payer: Medicare (Managed Care) | Attending: Pharmacist | Admitting: Pharmacist

## 2024-04-08 ENCOUNTER — Telehealth: Payer: Self-pay | Admitting: Oncology

## 2024-04-08 NOTE — Telephone Encounter (Signed)
 Called to schedule follow up appt per inbasket with interpreter. Phone number invalid. Unable to LVM.

## 2024-04-12 ENCOUNTER — Encounter: Payer: Self-pay | Admitting: Internal Medicine

## 2024-04-16 ENCOUNTER — Other Ambulatory Visit: Payer: Self-pay

## 2024-04-16 ENCOUNTER — Encounter (HOSPITAL_COMMUNITY): Payer: Self-pay | Admitting: *Deleted

## 2024-04-16 ENCOUNTER — Emergency Department (HOSPITAL_COMMUNITY)
Admission: EM | Admit: 2024-04-16 | Discharge: 2024-04-17 | Disposition: A | Discharge status: Home / Self Care | Payer: Medicare (Managed Care) | Attending: Emergency Medicine | Admitting: Emergency Medicine

## 2024-04-16 DIAGNOSIS — Z794 Long term (current) use of insulin: Secondary | ICD-10-CM | POA: Insufficient documentation

## 2024-04-16 DIAGNOSIS — Z7984 Long term (current) use of oral hypoglycemic drugs: Secondary | ICD-10-CM | POA: Diagnosis not present

## 2024-04-16 DIAGNOSIS — E119 Type 2 diabetes mellitus without complications: Secondary | ICD-10-CM | POA: Insufficient documentation

## 2024-04-16 DIAGNOSIS — Z466 Encounter for fitting and adjustment of urinary device: Secondary | ICD-10-CM | POA: Insufficient documentation

## 2024-04-16 LAB — COMPREHENSIVE METABOLIC PANEL WITH GFR
ALT: 16 U/L (ref 0–44)
AST: 23 U/L (ref 15–41)
Albumin: 3 g/dL — ABNORMAL LOW (ref 3.5–5.0)
Alkaline Phosphatase: 105 U/L (ref 38–126)
Anion gap: 12 (ref 5–15)
BUN: 13 mg/dL (ref 8–23)
CO2: 21 mmol/L — ABNORMAL LOW (ref 22–32)
Calcium: 8.8 mg/dL — ABNORMAL LOW (ref 8.9–10.3)
Chloride: 98 mmol/L (ref 98–111)
Creatinine, Ser: 0.81 mg/dL (ref 0.61–1.24)
GFR, Estimated: >60 mL/min (ref >60)
Glucose, Bld: 418 mg/dL — ABNORMAL HIGH (ref 70–99)
Potassium: 3.1 mmol/L — ABNORMAL LOW (ref 3.5–5.1)
Sodium: 131 mmol/L — ABNORMAL LOW (ref 135–145)
Total Bilirubin: 0.5 mg/dL (ref 0.0–1.2)
Total Protein: 7.8 g/dL (ref 6.5–8.1)

## 2024-04-16 LAB — CBC
HCT: 34.6 % — ABNORMAL LOW (ref 39.0–52.0)
Hemoglobin: 11.6 g/dL — ABNORMAL LOW (ref 13.0–17.0)
MCH: 31.4 pg (ref 26.0–34.0)
MCHC: 33.5 g/dL (ref 30.0–36.0)
MCV: 93.5 fL (ref 80.0–100.0)
Platelets: 558 10*3/uL — ABNORMAL HIGH (ref 150–400)
RBC: 3.7 MIL/uL — ABNORMAL LOW (ref 4.22–5.81)
RDW: 12.9 % (ref 11.5–15.5)
WBC: 7.4 10*3/uL (ref 4.0–10.5)
nRBC: 0 % (ref 0.0–0.2)

## 2024-04-16 NOTE — ED Provider Notes (Signed)
 [place] EMERGENCY DEPARTMENT AT [facility] Provider Note   CSN: [id number] Arrival date & time: 04/16/24  1752     History {Add pertinent medical, surgical, social history, OB history to HPI:1} Chief Complaint  Patient presents with   catheter needs to be removed    Kevin [name] is a 73 y.o. male.  HPI     Home Medications Prior to Admission medications   Medication Sig Start Date End Date Taking? Authorizing Provider  Blood Glucose Monitoring Suppl (ONETOUCH VERIO FLEX SYSTEM) w/Device KIT Use to test blood sugars daily. 02/24/24   [name], MD  glucose blood (ACCU-CHEK GUIDE TEST) test strip Use as instructed to check blood sugars twice a day before meals. 02/24/24   [name], MD  imatinib  (GLEEVEC ) 100 MG tablet Take 2 tablets (200 mg total) by mouth daily. Take with meals and large glass of water.Caution:Chemotherapy Patient not taking: Reported on 02/06/2024 01/02/23   [name], MD  insulin  glargine (LANTUS  SOLOSTAR) 100 UNIT/ML Solostar Pen Inject 30 Units into the skin daily. 02/13/24   [name], MD  Insulin  Pen Needle 31G X 5 MM MISC Use with insulin  daily. Patient not taking: Reported on 02/24/2024 04/03/23   [name], PA-C  Lancets [facility] DELICA PLUS [place]) MISC Use as instructed to check blood sugars twice a day before meals. 02/24/24   [name], MD  losartan  (COZAAR ) 25 MG tablet Take 1 tablet (25 mg total) by mouth daily. 02/24/24   [name], MD  metFORMIN  (GLUCOPHAGE ) 500 MG tablet Take 1 tablet (500 mg total) by mouth daily with breakfast. 02/24/24   [name], MD  tamsulosin  (FLOMAX ) 0.4 MG CAPS capsule Take 2 capsules (0.8 mg total) by mouth daily after supper. 02/24/24   [name], MD  tamsulosin  (FLOMAX ) 0.4 MG CAPS capsule Take 2 capsules (0.8 mg total) by mouth at bedtime. 02/27/24         Allergies    Patient has no known allergies.    Review of Systems   Review of  Systems  Physical Exam Updated Vital Signs BP (!) 184/108 (BP Location: Left Arm)   Pulse 85   Temp 99.2 F (37.3 C) (Oral)   Resp 16   Ht 5\' 2"  (1.575 m)   Wt 57.2 kg   SpO2 99%   BMI 23.06 kg/m  Physical Exam  ED Results / Procedures / Treatments   Labs (all labs ordered are listed, but only abnormal results are displayed) Labs Reviewed  COMPREHENSIVE METABOLIC PANEL WITH GFR - Abnormal; Notable for the following components:      Result Value   Sodium 131 (*)    Potassium 3.1 (*)    CO2 21 (*)    Glucose, Bld 418 (*)    Calcium 8.8 (*)    Albumin  3.0 (*)    All other components within normal limits  CBC - Abnormal; Notable for the following components:   RBC 3.70 (*)    Hemoglobin 11.6 (*)    HCT 34.6 (*)    Platelets 558 (*)    All other components within normal limits    EKG None  Radiology No results found.  Procedures Procedures  {Document cardiac monitor, telemetry assessment procedure when appropriate:1}  Medications Ordered in ED Medications - No data to display  ED Course/ Medical Decision Making/ A&P   {   Click here for ABCD2, HEART and other calculatorsREFRESH Note before signing :  1}                              Medical Decision Making  ***  {Document critical care time when appropriate:1} {Document review of labs and clinical decision tools ie heart score, Chads2Vasc2 etc:1}  {Document your independent review of radiology images, and any outside records:1} {Document your discussion with family members, caretakers, and with consultants:1} {Document social determinants of health affecting pt's care:1} {Document your decision making why or why not admission, treatments were needed:1} Final Clinical Impression(s) / ED Diagnoses Final diagnoses:  None    Rx / DC Orders ED Discharge Orders     None

## 2024-04-16 NOTE — ED Triage Notes (Signed)
 The pt has had a foley cath in place for 3 months.  The catheter was placed at [place] [facility].. when the pt went back to get it removed they were told that they could not remove it  he is not in any pain   he just wants the catheter removed

## 2024-04-17 NOTE — Discharge Instructions (Signed)
 Your catheter was removed today.  Will follow-up with the urologist listed below.  Return to ER if you note any worsening abdominal pain abdominal distention, difficulty urinating, or any other severe symptoms.

## 2024-04-19 ENCOUNTER — Other Ambulatory Visit (HOSPITAL_COMMUNITY): Payer: Self-pay

## 2024-04-21 ENCOUNTER — Other Ambulatory Visit (HOSPITAL_COMMUNITY): Payer: Self-pay

## 2024-04-29 ENCOUNTER — Other Ambulatory Visit (HOSPITAL_COMMUNITY): Payer: Self-pay

## 2024-04-30 ENCOUNTER — Other Ambulatory Visit: Payer: Self-pay | Admitting: Internal Medicine

## 2024-04-30 ENCOUNTER — Other Ambulatory Visit (HOSPITAL_COMMUNITY): Payer: Self-pay

## 2024-04-30 ENCOUNTER — Other Ambulatory Visit: Payer: Self-pay

## 2024-04-30 DIAGNOSIS — E1165 Type 2 diabetes mellitus with hyperglycemia: Secondary | ICD-10-CM

## 2024-04-30 NOTE — Telephone Encounter (Signed)
 Copied from CRM [id number]. Topic: Clinical - Prescription Issue >> Apr 30, 2024  3:33 PM Kevin [name] wrote: Reason for CRM: The patient's wife called back to advise that the name of the insulin  that the patient needs refilled was insulin  glargine (LANTUS  SOLOSTAR) 100 UNIT/ML Solostar Pen. The prescription refill request was sent up previously.

## 2024-04-30 NOTE — Telephone Encounter (Unsigned)
 Copied from CRM [id number]. Topic: Clinical - Medication Refill >> Apr 30, 2024  2:50 PM Carla L wrote: Medication: Insulin , wife unsure of name of rx.   Has the patient contacted their pharmacy? Yes Contacted the pharmacy a couple times, pharmacy has not heard back from office.   This is the patient's preferred pharmacy:  [facility] [address] [place] [address] Phone: [phone] Fax: [phone]  Is this the correct pharmacy for this prescription? Yes If no, delete pharmacy and type the correct one.   Has the prescription been filled recently? No  Is the patient out of the medication? No  Has the patient been seen for an appointment in the last year OR does the patient have an upcoming appointment? Yes  Can we respond through MyChart? No  Agent: Please be advised that Rx refills may take up to 3 business days. We ask that you follow-up with your pharmacy.

## 2024-05-04 ENCOUNTER — Other Ambulatory Visit (HOSPITAL_COMMUNITY): Payer: Self-pay

## 2024-05-04 MED ORDER — LANTUS SOLOSTAR 100 UNIT/ML ~~LOC~~ SOPN
30.0000 [IU] | PEN_INJECTOR | Freq: Every day | SUBCUTANEOUS | 1 refills | Status: DC
Start: 2024-05-04 — End: 2024-06-25
  Filled 2024-05-04: qty 15, 50d supply, fill #0

## 2024-05-04 NOTE — Telephone Encounter (Signed)
 Requested Prescriptions  Pending Prescriptions Disp Refills   insulin  glargine (LANTUS  SOLOSTAR) 100 UNIT/ML Solostar Pen 15 mL 1    Sig: Inject 30 Units into the skin daily.     Endocrinology:  Diabetes - Insulins Failed - 05/04/2024  2:55 PM      Failed - HBA1C is between 0 and 7.9 and within 180 days    HbA1c POC (<> result, manual entry)  Date Value Ref Range Status  04/03/2023 >15 4.0 - 5.6 % Final    Comment:    above 15   Hgb A1c MFr Bld  Date Value Ref Range Status  02/07/2024 >15.5 (H) 4.8 - 5.6 % Final    Comment:    (NOTE)         Prediabetes: 5.7 - 6.4         Diabetes: >6.4         Glycemic control for adults with diabetes: <7.0          Failed - Valid encounter within last 6 months    Recent Outpatient Visits           2 months ago Hospital discharge follow-up   [place] [facility] - A Dept Of [place]. [facility] [name], MD   10 months ago Long term (current) use of oral hypoglycemic drugs   [facility] Of [place]. [facility] Freada Jacobs, [place] L, RPH-CPP   11 months ago Type 2 diabetes mellitus with hyperglycemia, unspecified whether long term insulin  use (HCC)   [facility] - [facility] Dept Of [place]. [facility] Freada Jacobs, [place] L, RPH-CPP   1 year ago Type 2 diabetes mellitus with hyperglycemia, unspecified whether long term insulin  use (HCC)   [facility] - A Dept Of [place]. [facility] Freada Jacobs, [place] L, RPH-CPP   1 year ago Type 2 diabetes mellitus with hyperglycemia, unspecified whether long term insulin  use (HCC)   [facility] - A Dept Of [place]. [facility] [place], Stan Eans, [place]       Future Appointments             In 1 month Lincoln Renshaw, Rexine Cater, MD [facility] [place] - A Dept Of [place]. [facility]

## 2024-05-17 DIAGNOSIS — R55 Syncope and collapse: Secondary | ICD-10-CM | POA: Diagnosis not present

## 2024-05-17 DIAGNOSIS — B961 Klebsiella pneumoniae [K. pneumoniae] as the cause of diseases classified elsewhere: Secondary | ICD-10-CM | POA: Diagnosis not present

## 2024-05-17 DIAGNOSIS — A419 Sepsis, unspecified organism: Secondary | ICD-10-CM | POA: Diagnosis not present

## 2024-05-17 DIAGNOSIS — N401 Enlarged prostate with lower urinary tract symptoms: Secondary | ICD-10-CM | POA: Diagnosis not present

## 2024-05-17 DIAGNOSIS — Z8546 Personal history of malignant neoplasm of prostate: Secondary | ICD-10-CM | POA: Diagnosis not present

## 2024-05-17 DIAGNOSIS — S0990XA Unspecified injury of head, initial encounter: Secondary | ICD-10-CM | POA: Diagnosis not present

## 2024-05-17 DIAGNOSIS — R160 Hepatomegaly, not elsewhere classified: Secondary | ICD-10-CM | POA: Diagnosis not present

## 2024-05-17 DIAGNOSIS — W19XXXA Unspecified fall, initial encounter: Secondary | ICD-10-CM | POA: Diagnosis not present

## 2024-05-17 DIAGNOSIS — Z79899 Other long term (current) drug therapy: Secondary | ICD-10-CM | POA: Diagnosis not present

## 2024-05-17 DIAGNOSIS — Z794 Long term (current) use of insulin: Secondary | ICD-10-CM | POA: Diagnosis not present

## 2024-05-17 DIAGNOSIS — E785 Hyperlipidemia, unspecified: Secondary | ICD-10-CM | POA: Diagnosis not present

## 2024-05-17 DIAGNOSIS — C787 Secondary malignant neoplasm of liver and intrahepatic bile duct: Secondary | ICD-10-CM | POA: Diagnosis not present

## 2024-05-17 DIAGNOSIS — N3001 Acute cystitis with hematuria: Secondary | ICD-10-CM | POA: Diagnosis not present

## 2024-05-17 DIAGNOSIS — D71 Functional disorders of polymorphonuclear neutrophils: Secondary | ICD-10-CM | POA: Diagnosis not present

## 2024-05-17 DIAGNOSIS — I10 Essential (primary) hypertension: Secondary | ICD-10-CM | POA: Diagnosis not present

## 2024-05-17 DIAGNOSIS — N390 Urinary tract infection, site not specified: Secondary | ICD-10-CM | POA: Diagnosis not present

## 2024-05-17 DIAGNOSIS — E119 Type 2 diabetes mellitus without complications: Secondary | ICD-10-CM | POA: Diagnosis not present

## 2024-05-20 ENCOUNTER — Other Ambulatory Visit (HOSPITAL_COMMUNITY): Payer: Self-pay

## 2024-05-21 ENCOUNTER — Telehealth: Payer: Self-pay | Admitting: *Deleted

## 2024-05-21 ENCOUNTER — Other Ambulatory Visit: Payer: Self-pay

## 2024-05-21 DIAGNOSIS — C49A2 Gastrointestinal stromal tumor of stomach: Secondary | ICD-10-CM

## 2024-05-21 NOTE — Transitions of Care (Post Inpatient/ED Visit) (Signed)
   05/21/2024  Name: Kevin [name] MRN: [id number] DOB: 1951/08/25  Today's TOC FU Call Status: Today's TOC FU Call Status:: Unsuccessful Call (1st Attempt) Unsuccessful Call (1st Attempt) Date: 05/21/24  Attempted to reach the patient regarding the most recent Inpatient/ED visit.   Follow Up Plan: Additional outreach attempts will be made to reach the patient to complete the Transitions of Care (Post Inpatient/ED visit) call.   Arna Better RN, BSN [place]  [facility] RN Care Manager [phone]

## 2024-05-24 ENCOUNTER — Telehealth: Payer: Self-pay | Admitting: *Deleted

## 2024-05-24 DIAGNOSIS — N138 Other obstructive and reflux uropathy: Secondary | ICD-10-CM | POA: Diagnosis not present

## 2024-05-24 DIAGNOSIS — E119 Type 2 diabetes mellitus without complications: Secondary | ICD-10-CM | POA: Diagnosis not present

## 2024-05-24 DIAGNOSIS — N390 Urinary tract infection, site not specified: Secondary | ICD-10-CM | POA: Diagnosis not present

## 2024-05-24 DIAGNOSIS — M25561 Pain in right knee: Secondary | ICD-10-CM | POA: Diagnosis not present

## 2024-05-24 DIAGNOSIS — A419 Sepsis, unspecified organism: Secondary | ICD-10-CM | POA: Diagnosis not present

## 2024-05-24 DIAGNOSIS — N401 Enlarged prostate with lower urinary tract symptoms: Secondary | ICD-10-CM | POA: Diagnosis not present

## 2024-05-24 DIAGNOSIS — R160 Hepatomegaly, not elsewhere classified: Secondary | ICD-10-CM | POA: Diagnosis not present

## 2024-05-24 DIAGNOSIS — G8929 Other chronic pain: Secondary | ICD-10-CM | POA: Diagnosis not present

## 2024-05-24 NOTE — Transitions of Care (Post Inpatient/ED Visit) (Signed)
   05/24/2024  Name: Kevin [name] MRN: [id number] DOB: 1951/10/25  Today's TOC FU Call Status: Today's TOC FU Call Status:: Unsuccessful Call (2nd Attempt) Unsuccessful Call (2nd Attempt) Date: 05/24/24  Attempted to reach the patient regarding the most recent Inpatient/ED visit.  Follow Up Plan: Additional outreach attempts will be made to reach the patient to complete the Transitions of Care (Post Inpatient/ED visit) call.   Arna Better RN, BSN   [facility] Health RN Care Manager [phone]

## 2024-05-25 ENCOUNTER — Telehealth: Payer: Self-pay | Admitting: *Deleted

## 2024-05-25 NOTE — Transitions of Care (Post Inpatient/ED Visit) (Signed)
   05/25/2024  Name: MOSI HANNOLD MRN: [id number] DOB: May 09, 1951  Today's TOC FU Call Status: Today's TOC FU Call Status:: Unsuccessful Call (3rd Attempt) Unsuccessful Call (3rd Attempt) Date: 05/25/24  Attempted to reach the patient regarding the most recent Inpatient/ED visit.  Follow Up Plan: No further outreach attempts will be made at this time. We have been unable to contact the patient.  Arna Better RN, BSN [place]  [facility] Health RN Care Manager [phone]

## 2024-05-27 ENCOUNTER — Telehealth: Payer: Self-pay

## 2024-05-27 NOTE — Telephone Encounter (Signed)
 I contacted the patient to provide a reminder and spoke with the patient's spouse. She informed me that she needs to cancel the appointment due to transportation issues. I offered information about [place] transportation services. The spouse was unaware of the patient's new address.

## 2024-05-28 ENCOUNTER — Telehealth: Payer: Self-pay | Admitting: Oncology

## 2024-05-28 ENCOUNTER — Ambulatory Visit: Payer: Medicare (Managed Care) | Admitting: Nurse Practitioner

## 2024-05-28 ENCOUNTER — Other Ambulatory Visit: Payer: Medicare (Managed Care)

## 2024-05-28 NOTE — Telephone Encounter (Signed)
 Left PT a message with new appt details.

## 2024-06-10 ENCOUNTER — Inpatient Hospital Stay: Payer: Medicare (Managed Care) | Admitting: Nurse Practitioner

## 2024-06-10 ENCOUNTER — Inpatient Hospital Stay: Payer: Medicare (Managed Care) | Attending: Oncology

## 2024-06-25 ENCOUNTER — Ambulatory Visit: Payer: Medicare (Managed Care) | Attending: Internal Medicine | Admitting: Internal Medicine

## 2024-06-25 ENCOUNTER — Encounter: Payer: Self-pay | Admitting: Internal Medicine

## 2024-06-25 VITALS — BP 133/69 | HR 70 | Temp 97.8°F | Ht 62.0 in | Wt 136.0 lb

## 2024-06-25 DIAGNOSIS — E1159 Type 2 diabetes mellitus with other circulatory complications: Secondary | ICD-10-CM

## 2024-06-25 DIAGNOSIS — Z794 Long term (current) use of insulin: Secondary | ICD-10-CM | POA: Diagnosis not present

## 2024-06-25 DIAGNOSIS — Z2821 Immunization not carried out because of patient refusal: Secondary | ICD-10-CM | POA: Diagnosis not present

## 2024-06-25 DIAGNOSIS — E042 Nontoxic multinodular goiter: Secondary | ICD-10-CM

## 2024-06-25 DIAGNOSIS — Z7984 Long term (current) use of oral hypoglycemic drugs: Secondary | ICD-10-CM

## 2024-06-25 DIAGNOSIS — C49A2 Gastrointestinal stromal tumor of stomach: Secondary | ICD-10-CM | POA: Diagnosis not present

## 2024-06-25 DIAGNOSIS — I10 Essential (primary) hypertension: Secondary | ICD-10-CM | POA: Diagnosis not present

## 2024-06-25 DIAGNOSIS — K769 Liver disease, unspecified: Secondary | ICD-10-CM

## 2024-06-25 DIAGNOSIS — E1169 Type 2 diabetes mellitus with other specified complication: Secondary | ICD-10-CM | POA: Diagnosis not present

## 2024-06-25 DIAGNOSIS — Z1211 Encounter for screening for malignant neoplasm of colon: Secondary | ICD-10-CM

## 2024-06-25 LAB — POCT GLYCOSYLATED HEMOGLOBIN (HGB A1C): HbA1c, POC (controlled diabetic range): 11.1 % — AB (ref 0.0–7.0)

## 2024-06-25 LAB — GLUCOSE, POCT (MANUAL RESULT ENTRY): POC Glucose: 312 mg/dL — AB (ref 70–99)

## 2024-06-25 MED ORDER — INSULIN PEN NEEDLE 31G X 5 MM MISC: 1.0000 | Freq: Every day | 1 refills | Status: AC

## 2024-06-25 MED ORDER — METFORMIN HCL 500 MG PO TABS: 500.0000 mg | ORAL_TABLET | Freq: Two times a day (BID) | ORAL | 2 refills | Status: AC

## 2024-06-25 MED ORDER — INSULIN LISPRO (1 UNIT DIAL) 100 UNIT/ML (KWIKPEN): PEN_INJECTOR | SUBCUTANEOUS | 11 refills | Status: AC

## 2024-06-25 MED ORDER — LANTUS SOLOSTAR 100 UNIT/ML ~~LOC~~ SOPN: 36.0000 [IU] | PEN_INJECTOR | Freq: Every day | SUBCUTANEOUS | 1 refills | Status: AC

## 2024-06-25 NOTE — Progress Notes (Signed)
 Patient ID: [name], male    DOB: 07-Jul-1951  MRN: [id number]  CC: Diabetes (DM & HTN f/u. /No questions / concerns/Discuss shingles vax )   Subjective: Kevin [name] is a 73 y.o. male who presents for chronic ds management. Son, Kevin [name], is with him.  His concerns today include:  Patient with history of HTN, IDA, DM type II, former tobacco, GIST, chronic low potassium   AMN Language interpreter used during this encounter. #Ximena #[id number]  Discussed the use of AI scribe software for clinical note transcription with the patient, who gave verbal consent to proceed.  History of Present Illness Kevin [name] is a 73 year old male with diabetes and hypertension who presents for follow-up. He is accompanied by his son, Kevin [name].  DM: Results for orders placed or performed in visit on 06/25/24  POCT glucose (manual entry)   Collection Time: 06/25/24  9:00 AM  Result Value Ref Range   POC Glucose 312 (A) 70 - 99 mg/dl  POCT glycosylated hemoglobin (Hb A1C)   Collection Time: 06/25/24  9:09 AM  Result Value Ref Range   Hemoglobin A1C     HbA1c POC (<> result, manual entry)     HbA1c, POC (prediabetic range)     HbA1c, POC (controlled diabetic range) 11.1 (A) 0.0 - 7.0 %  His hemoglobin A1c was 15.5% four months ago and is 11.1% today. Blood glucose levels are consistently high, with a reading of 312 mg/dL today. He takes metformin  500 mg daily and Lantus  insulin  30 units daily but has not used insulin  for three days due to running out of pen needles. His son administers his insulin  and checks his blood sugar before lunch and at bedtime, with readings typically between 200-300 mg/dL. His dietary habits include avoiding sugar, opting for no-sugar sodas, and sometimes drinking coffee black. He has not met with a nutritionist for dietary counseling. He reports fluctuations in blood sugar levels with different beverages, including coffee and milk.  GIST/Liver lesions: He has a history  of a stomach tumor and new liver masses seen on MRI done 02/2024 during hospitalization.  These lesions were concerning for metastasis.  He was supposed to have followed up with his oncologist Dr. Antonieta but looks like they have had a difficult time getting in contact with him. Now has a follow-up appointment scheduled with his cancer specialist on the 30th of this month and his son is aware of this appt.   HTN: Should be on Cozaar  25 mg daily.  Son confirms that he has it and is taking it.  He has an upcoming thyroid ultrasound scheduled for the 21st of this month due to a previously identified thyroid nodules seen on CT of the chest with a prominent one measuring 3.1 cm in the right lobe.  TSH level done 4 months ago was normal.    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) 02/06/2024   GERD (gastroesophageal reflux disease) 02/06/2024   Thyroid nodule 02/06/2024   History of fall 02/06/2024   Sepsis (HCC) 06/06/2022   Generalized weakness 06/06/2022   Chronic anemia 06/06/2022   Gastrointestinal stromal tumor (  GIST) (HCC) 05/29/2022   SIRS (systemic inflammatory response syndrome) (HCC) 04/17/2022   Lymphocytopenia 04/17/2022   Positive blood culture 04/17/2022   Hypocalcemia 04/15/2022   Elevated troponin 04/15/2022   Essential hypertension 05/11/2021   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 1 kit 0   glucose blood (ACCU-CHEK GUIDE TEST) test strip Use as instructed to check blood sugars twice a day before meals. 100 each 12   imatinib  (GLEEVEC ) 100 MG tablet Take 2 tablets (200 mg total) by mouth daily. Take with meals and large glass of water.Caution:Chemotherapy 60 tablet 1   insulin  glargine (LANTUS  SOLOSTAR) 100 UNIT/ML Solostar Pen Inject 30 Units into the skin daily. 15 mL 1   Insulin  Pen Needle 31G X 5 MM MISC Use with insulin  daily. 100 each 1   Lancets (ONETOUCH DELICA PLUS LANCET33G) MISC Use as instructed to check blood sugars twice a day before meals. 100 each 12   losartan  (COZAAR ) 25 MG tablet Take 1 tablet (25 mg total) by mouth daily. 90 tablet 1   metFORMIN  (GLUCOPHAGE ) 500 MG tablet Take 1 tablet (500 mg total) by mouth daily with breakfast. 90 tablet 1   tamsulosin  (FLOMAX ) 0.4 MG CAPS capsule Take 2 capsules (0.8 mg total) by mouth daily after supper. 60 capsule 1   tamsulosin  (FLOMAX ) 0.4 MG CAPS capsule Take 2 capsules (0.8 mg total) by mouth at bedtime. 120 capsule 3   No current facility-administered medications on file prior to visit.    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  Tobacco Use   Smoking status: Former    Current packs/day: 0.00    Types: Cigarettes    Quit date: 1998    Years since quitting: 27.5   Smokeless tobacco: Never   Tobacco comments:    01/27/2020 - Pt denied smoking cigarettes  Vaping Use    Vaping status: Never Used  Substance and Sexual Activity   Alcohol use: Not Currently   Drug use: Not Currently   Sexual activity: Not on file  Other Topics Concern   Not on file  Social History Narrative   Not on file   Social Drivers of Health   Financial Resource Strain: Not on file  Food Insecurity: No Food Insecurity (02/06/2024)   Hunger Vital Sign    Worried About Running Out of Food in the Last Year: Never true    Ran Out of Food in the Last Year: Never true  Transportation Needs: No Transportation Needs (02/06/2024)   PRAPARE - Administrator, Civil Service (Medical): No    Lack of Transportation (Non-Medical): No  Physical Activity: Not on file  Stress: Not on file  Social Connections: Moderately Integrated (02/06/2024)   Social  Connection and Isolation Panel    Frequency of Communication with Friends and Family: Three times a week    Frequency of Social Gatherings with Friends and Family: Three times a week    Attends Religious Services: Never    Active Member of Clubs or Organizations: Yes    Attends Banker Meetings: Never    Marital Status: Married  Catering manager Violence: Not At Risk (02/06/2024)   Humiliation, Afraid, Rape, and Kick questionnaire    Fear of Current or Ex-Partner: No    Emotionally Abused: No    Physically Abused: No    Sexually Abused: No    Family History  Problem Relation Age of Onset   Hypertension Other     Past Surgical History:  Procedure Laterality Date   BIOPSY  01/24/2020   Procedure: BIOPSY;  Surgeon: Lennard Lesta FALCON, MD;  Location: [facility] ENDOSCOPY;  Service: Endoscopy;;   ESOPHAGOGASTRODUODENOSCOPY (EGD) WITH PROPOFOL  N/A 01/24/2020   Procedure: ESOPHAGOGASTRODUODENOSCOPY (EGD) WITH PROPOFOL ;  Surgeon: Lennard Lesta FALCON, MD;  Location: [facility] ENDOSCOPY;  Service: Endoscopy;  Laterality: N/A;   IR CATHETER TUBE CHANGE  02/18/2020   IR RADIOLOGIST EVAL & MGMT  02/15/2020   IR RADIOLOGIST EVAL & MGMT  03/09/2020    LAPAROSCOPY N/A 05/29/2022   Procedure: LAPAROSCOPY DIAGNOSTIC;  Surgeon: Dasie Leonor CROME, MD;  Location: [facility] OR;  Service: General;  Laterality: N/A;   PARTIAL GASTRECTOMY N/A 05/29/2022   Procedure: OPEN PARTIAL GASTRECTOMY;  Surgeon: Dasie Leonor CROME, MD;  Location: [facility] OR;  Service: General;  Laterality: N/A;   SCLEROTHERAPY  01/24/2020   Procedure: SCLEROTHERAPY;  Surgeon: Lennard Lesta FALCON, MD;  Location: [facility] ENDOSCOPY;  Service: Endoscopy;;   SPLENECTOMY, TOTAL N/A 05/29/2022   Procedure: SPLENECTOMY;  Surgeon: Dasie Leonor CROME, MD;  Location: [facility] OR;  Service: General;  Laterality: N/A;    ROS: Review of Systems Negative except as stated above  PHYSICAL EXAM: BP 133/69 (BP Location: Left Arm, Patient Position: Sitting, Cuff Size: Normal)   Pulse 70   Temp 97.8 F (36.6 C) (Oral)   Ht 5' 2 (1.575 m)   Wt 136 lb (61.7 kg)   SpO2 96%   BMI 24.87 kg/m   Physical Exam   General appearance - alert, well appearing, and in no distress Mental status - normal mood, behavior, speech, dress, motor activity, and thought processes Neck - supple, no significant adenopathy Chest - clear to auscultation, no wheezes, rales or rhonchi, symmetric air entry Heart - normal rate, regular rhythm, normal S1, S2, no murmurs, rubs, clicks or gallops Extremities - peripheral pulses normal, no pedal edema, no clubbing or cyanosis     Latest Ref Rng & Units 04/16/2024    6:17 PM 02/24/2024   11:14 AM 02/12/2024    6:54 AM  CMP  Glucose 70 - 99 mg/dL 581  830  870   BUN 8 - 23 mg/dL 13  13  19    Creatinine 0.61 - 1.24 mg/dL 9.18  9.20  8.96   Sodium 135 - 145 mmol/L 131  138  134   Potassium 3.5 - 5.1 mmol/L 3.1  4.1  3.7   Chloride 98 - 111 mmol/L 98  100  102   CO2 22 - 32 mmol/L 21  21  25    Calcium 8.9 - 10.3 mg/dL 8.8  8.8  8.1   Total Protein 6.5 - 8.1 g/dL 7.8     Total Bilirubin 0.0 - 1.2 mg/dL 0.5  Alkaline Phos 38 - 126 U/L 105     AST 15 - 41 U/L 23     ALT 0 - 44 U/L 16      Lipid  Panel  No results found for: CHOL, TRIG, HDL, CHOLHDL, VLDL, LDLCALC, LDLDIRECT  CBC    Component Value Date/Time   WBC 7.4 04/16/2024 1817   RBC 3.70 (L) 04/16/2024 1817   HGB 11.6 (L) 04/16/2024 1817   HGB 10.3 (L) 02/24/2024 1114   HCT 34.6 (L) 04/16/2024 1817   HCT 30.8 (L) 02/24/2024 1114   PLT 558 (H) 04/16/2024 1817   PLT 578 (H) 02/24/2024 1114   MCV 93.5 04/16/2024 1817   MCV 95 02/24/2024 1114   MCH 31.4 04/16/2024 1817   MCHC 33.5 04/16/2024 1817   RDW 12.9 04/16/2024 1817   RDW 12.9 02/24/2024 1114   LYMPHSABS 2.6 02/10/2024 0502   MONOABS 1.1 (H) 02/10/2024 0502   EOSABS 0.3 02/10/2024 0502   BASOSABS 0.0 02/10/2024 0502    ASSESSMENT AND PLAN: 1. Type 2 diabetes mellitus with other specified complication, with long-term current use of insulin  (HCC) (Primary) A1c has improved but not at goal.  Reported blood sugar readings are not at goal. Increase Lantus  insulin  to 36 units daily. Add Humalog insulin  5 units with the 2 largest meals of the day which his son states are lunch and dinner. Increase metformin  to 500 mg twice a day. Prescription sent for pen needles. We will also try to get him in with an endocrinologist. In the meantime, advised that he continues to check blood sugars at least twice a day before meals.  Will have him follow-up with the clinical pharmacist in 1 month. - POCT glycosylated hemoglobin (Hb A1C) - POCT glucose (manual entry) - Insulin  Pen Needle 31G X 5 MM MISC; Use with insulin  daily.  Dispense: 100 each; Refill: 1 - insulin  glargine (LANTUS  SOLOSTAR) 100 UNIT/ML Solostar Pen; Inject 36 Units into the skin daily.  Dispense: 15 mL; Refill: 1 - insulin  lispro (HUMALOG KWIKPEN) 100 UNIT/ML KwikPen; 5 units subcut with the 2 largest meals of the day  Dispense: 15 mL; Refill: 11 - Ambulatory referral to Endocrinology - Ambulatory referral to Ophthalmology - Amb ref to Medical Nutrition Therapy-MNT - metFORMIN  (GLUCOPHAGE ) 500  MG tablet; Take 1 tablet (500 mg total) by mouth 2 (two) times daily with a meal.  Dispense: 180 tablet; Refill: 2  2. Long term (current) use of oral hypoglycemic drugs See #1 above.  3. Hypertension associated with type 2 diabetes mellitus (HCC) Not at goal.  He has not taken his Cozaar  as yet for today.  4. 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 Liver lesion Advised of the importance of keeping upcoming appointment with oncology. - Ambulatory referral to Gastroenterology  6. Multiple thyroid nodules Keep upcoming appointment for thyroid ultrasound  7. Screening for colon cancer Patient agreeable to colonoscopy for colon cancer screening - Ambulatory referral to Gastroenterology  8. Herpes zoster vaccination declined Recommended.  Patient declined   Patient was given the opportunity to ask questions.  Patient verbalized understanding of the plan and was able to repeat key elements of the plan.   This documentation was completed using Paediatric nurse.  Any transcriptional errors are unintentional.  Orders Placed This Encounter  Procedures   POCT glycosylated hemoglobin (Hb A1C)   POCT glucose (manual entry)     Requested Prescriptions    No prescriptions requested or ordered in this encounter  No follow-ups on file.  Barnie Louder, MD, FACP

## 2024-06-25 NOTE — Patient Instructions (Signed)
 VISIT SUMMARY:  Today, you came in for a follow-up appointment to manage your diabetes and hypertension. We also discussed your history of a stomach tumor and new liver masses, as well as an upcoming thyroid ultrasound.  YOUR PLAN:  -TYPE 2 DIABETES MELLITUS: Type 2 diabetes is a condition where your body does not use insulin  properly, leading to high blood sugar levels. Your blood sugar levels are currently high, so we are increasing your Lantus  insulin  to 36 units daily and adding Humalog 5 units with your two largest meals. We will send a prescription for insulin  pen and pen needles to [facility] in [place]. Continue taking metformin , but increase the dose to one tablet twice a day. You will be referred to an endocrinologist for specialized diabetes management, a diabetic eye exam, and a nutritionist for dietary counseling. Please continue checking your blood sugars and bring the readings to your next appointment. Follow up with the clinical pharmacist in one month.  -HYPERTENSION: Hypertension, or high blood pressure, can lead to serious health issues if not managed properly. Your blood pressure is slightly elevated today. Continue taking Cozaar  25 mg daily. We also recommend dietary changes to reduce salt intake and increase fresh fruits and vegetables.  -STOMACH TUMOR AND LIVER MASSES: You have a known stomach tumor and new liver masses. It is important to attend your follow-up appointment with your cancer specialist on July 30 to discuss further management.  -THYROID Nodule: A thyroid mass is an abnormal growth in the thyroid gland. You have an ultrasound scheduled for further evaluation on July 21 at [facility], [facility].  INSTRUCTIONS:  Please follow up with the clinical pharmacist in one month for diabetes management. Attend your thyroid ultrasound appointment on July 21 at [facility], [facility], and your follow-up appointment  with your cancer specialist on July 30.

## 2024-06-28 ENCOUNTER — Ambulatory Visit (HOSPITAL_BASED_OUTPATIENT_CLINIC_OR_DEPARTMENT_OTHER): Admission: RE | Admit: 2024-06-28 | Payer: Medicare (Managed Care) | Source: Ambulatory Visit

## 2024-07-07 ENCOUNTER — Inpatient Hospital Stay: Payer: Medicare (Managed Care)

## 2024-07-07 ENCOUNTER — Inpatient Hospital Stay: Payer: Medicare (Managed Care) | Admitting: Nurse Practitioner

## 2024-07-23 ENCOUNTER — Ambulatory Visit: Payer: Medicare (Managed Care) | Admitting: Pharmacist

## 2024-09-03 DIAGNOSIS — R404 Transient alteration of awareness: Secondary | ICD-10-CM | POA: Diagnosis not present

## 2024-09-03 DIAGNOSIS — Z85028 Personal history of other malignant neoplasm of stomach: Secondary | ICD-10-CM | POA: Diagnosis not present

## 2024-09-03 DIAGNOSIS — Z8509 Personal history of malignant neoplasm of other digestive organs: Secondary | ICD-10-CM | POA: Diagnosis not present

## 2024-09-03 DIAGNOSIS — Z91148 Patient's other noncompliance with medication regimen for other reason: Secondary | ICD-10-CM | POA: Diagnosis not present

## 2024-09-03 DIAGNOSIS — J9811 Atelectasis: Secondary | ICD-10-CM | POA: Diagnosis not present

## 2024-09-03 DIAGNOSIS — E785 Hyperlipidemia, unspecified: Secondary | ICD-10-CM | POA: Diagnosis not present

## 2024-09-03 DIAGNOSIS — I10 Essential (primary) hypertension: Secondary | ICD-10-CM | POA: Diagnosis not present

## 2024-09-03 DIAGNOSIS — K7689 Other specified diseases of liver: Secondary | ICD-10-CM | POA: Diagnosis not present

## 2024-09-03 DIAGNOSIS — R739 Hyperglycemia, unspecified: Secondary | ICD-10-CM | POA: Diagnosis not present

## 2024-09-03 DIAGNOSIS — T383X6A Underdosing of insulin and oral hypoglycemic [antidiabetic] drugs, initial encounter: Secondary | ICD-10-CM | POA: Diagnosis not present

## 2024-09-03 DIAGNOSIS — E861 Hypovolemia: Secondary | ICD-10-CM | POA: Diagnosis not present

## 2024-09-03 DIAGNOSIS — R160 Hepatomegaly, not elsewhere classified: Secondary | ICD-10-CM | POA: Diagnosis not present

## 2024-09-03 DIAGNOSIS — R4182 Altered mental status, unspecified: Secondary | ICD-10-CM | POA: Diagnosis not present

## 2024-09-03 DIAGNOSIS — B952 Enterococcus as the cause of diseases classified elsewhere: Secondary | ICD-10-CM | POA: Diagnosis not present

## 2024-09-03 DIAGNOSIS — E1165 Type 2 diabetes mellitus with hyperglycemia: Secondary | ICD-10-CM | POA: Diagnosis not present

## 2024-09-03 DIAGNOSIS — N179 Acute kidney failure, unspecified: Secondary | ICD-10-CM | POA: Diagnosis not present

## 2024-09-03 DIAGNOSIS — Z794 Long term (current) use of insulin: Secondary | ICD-10-CM | POA: Diagnosis not present

## 2024-09-03 DIAGNOSIS — I499 Cardiac arrhythmia, unspecified: Secondary | ICD-10-CM | POA: Diagnosis not present

## 2024-09-03 DIAGNOSIS — R000 Tachycardia, unspecified: Secondary | ICD-10-CM | POA: Diagnosis not present

## 2024-09-03 DIAGNOSIS — E1110 Type 2 diabetes mellitus with ketoacidosis without coma: Secondary | ICD-10-CM | POA: Diagnosis not present

## 2024-09-03 DIAGNOSIS — Z603 Acculturation difficulty: Secondary | ICD-10-CM | POA: Diagnosis not present

## 2024-09-03 DIAGNOSIS — R7881 Bacteremia: Secondary | ICD-10-CM | POA: Diagnosis not present

## 2024-09-03 DIAGNOSIS — G9341 Metabolic encephalopathy: Secondary | ICD-10-CM | POA: Diagnosis not present

## 2024-09-05 DIAGNOSIS — R000 Tachycardia, unspecified: Secondary | ICD-10-CM | POA: Diagnosis not present

## 2024-09-14 ENCOUNTER — Encounter: Payer: Self-pay | Admitting: Internal Medicine

## 2024-09-14 ENCOUNTER — Telehealth: Payer: Self-pay | Admitting: *Deleted

## 2024-09-14 DIAGNOSIS — N401 Enlarged prostate with lower urinary tract symptoms: Secondary | ICD-10-CM | POA: Diagnosis not present

## 2024-09-14 DIAGNOSIS — I81 Portal vein thrombosis: Secondary | ICD-10-CM | POA: Diagnosis not present

## 2024-09-14 DIAGNOSIS — Z794 Long term (current) use of insulin: Secondary | ICD-10-CM | POA: Diagnosis not present

## 2024-09-14 DIAGNOSIS — N138 Other obstructive and reflux uropathy: Secondary | ICD-10-CM | POA: Diagnosis not present

## 2024-09-14 DIAGNOSIS — E1110 Type 2 diabetes mellitus with ketoacidosis without coma: Secondary | ICD-10-CM | POA: Diagnosis not present

## 2024-09-14 DIAGNOSIS — I10 Essential (primary) hypertension: Secondary | ICD-10-CM | POA: Diagnosis not present

## 2024-09-14 DIAGNOSIS — Z8509 Personal history of malignant neoplasm of other digestive organs: Secondary | ICD-10-CM | POA: Diagnosis not present

## 2024-09-14 DIAGNOSIS — R160 Hepatomegaly, not elsewhere classified: Secondary | ICD-10-CM | POA: Diagnosis not present

## 2024-09-14 DIAGNOSIS — E1165 Type 2 diabetes mellitus with hyperglycemia: Secondary | ICD-10-CM | POA: Diagnosis not present

## 2024-09-14 NOTE — Transitions of Care (Post Inpatient/ED Visit) (Signed)
   09/14/2024  Name: Kevin [name] MRN: [id number] DOB: [birth year]  Today's TOC FU Call Status: Today's TOC FU Call Status:: Unsuccessful Call (1st Attempt) Unsuccessful Call (1st Attempt) Date: 09/14/24  Attempted to reach the patient regarding the most recent Inpatient/ED visit.  Follow Up Plan: Additional outreach attempts will be made to reach the patient to complete the Transitions of Care (Post Inpatient/ED visit) call.   Andrea Dimes RN, BSN [place]  [facility] Health RN Care Manager [phone]

## 2024-09-15 ENCOUNTER — Telehealth: Payer: Self-pay | Admitting: *Deleted

## 2024-09-15 NOTE — Transitions of Care (Post Inpatient/ED Visit) (Signed)
   09/15/2024  Name: RISHAAN GUNNER MRN: [id number] DOB: [birth year]  Today's TOC FU Call Status: Today's TOC FU Call Status:: Unsuccessful Call (2nd Attempt) Unsuccessful Call (2nd Attempt) Date: 09/15/24  Attempted to reach the patient regarding the most recent Inpatient/ED visit.  Follow Up Plan: Additional outreach attempts will be made to reach the patient to complete the Transitions of Care (Post Inpatient/ED visit) call.   Andrea Dimes RN, BSN [place]  [facility] Health RN Care Manager [phone]

## 2024-09-16 ENCOUNTER — Telehealth: Payer: Self-pay | Admitting: *Deleted

## 2024-09-16 NOTE — Transitions of Care (Post Inpatient/ED Visit) (Signed)
   09/16/2024  Name: Kevin [name] MRN: [id number] DOB: 03/14/1951  Today's TOC FU Call Status: Today's TOC FU Call Status:: Unsuccessful Call (3rd Attempt) Unsuccessful Call (3rd Attempt) Date: 09/16/24  Attempted to reach the patient regarding the most recent Inpatient/ED visit.  Follow Up Plan: No further outreach attempts will be made at this time. We have been unable to contact the patient.  Andrea Dimes RN, BSN [place]  [facility] Health RN Care Manager [phone]

## 2024-09-22 DIAGNOSIS — C787 Secondary malignant neoplasm of liver and intrahepatic bile duct: Secondary | ICD-10-CM | POA: Diagnosis not present

## 2024-09-22 DIAGNOSIS — C49A2 Gastrointestinal stromal tumor of stomach: Secondary | ICD-10-CM | POA: Diagnosis not present

## 2024-09-22 DIAGNOSIS — I81 Portal vein thrombosis: Secondary | ICD-10-CM | POA: Diagnosis not present

## 2024-09-23 DIAGNOSIS — Z4803 Encounter for change or removal of drains: Secondary | ICD-10-CM | POA: Diagnosis not present

## 2024-09-23 DIAGNOSIS — K7689 Other specified diseases of liver: Secondary | ICD-10-CM | POA: Diagnosis not present

## 2024-09-23 DIAGNOSIS — J9811 Atelectasis: Secondary | ICD-10-CM | POA: Diagnosis not present

## 2024-10-05 ENCOUNTER — Telehealth: Payer: Self-pay | Admitting: Pharmacist

## 2024-10-05 DIAGNOSIS — R7881 Bacteremia: Secondary | ICD-10-CM | POA: Diagnosis not present

## 2024-10-05 DIAGNOSIS — B952 Enterococcus as the cause of diseases classified elsewhere: Secondary | ICD-10-CM | POA: Diagnosis not present

## 2024-10-05 DIAGNOSIS — Z792 Long term (current) use of antibiotics: Secondary | ICD-10-CM | POA: Diagnosis not present

## 2024-10-05 DIAGNOSIS — K769 Liver disease, unspecified: Secondary | ICD-10-CM | POA: Diagnosis not present

## 2024-10-05 NOTE — Progress Notes (Signed)
 Pharmacy Quality Measure Review  This patient is appearing on the insurance-providing list for being at risk of failing the adherence measure for Statin Use in Persons with Diabetes (SUPD) medications this calendar year.   No history of statin therapy documented. Most recent A1c 14.7% at [facility] during September hospitalization. Patient saw Internal Medicine at [facility] on 09/21/2024, has follow up scheduled 10/19/2024. Is scheduled to see Dr. Vicci on 10/26/2024. Recently reconnected with hematology/oncology at [facility] for treatment of malignant GI tumor, treated with Gleevec .   Recommend checking lipid panel. Moderate intensity statin indicated given diagnosis of diabetes.   Catie IVAR Centers, PharmD, [facility] Clinical Pharmacist [phone]

## 2024-10-26 ENCOUNTER — Ambulatory Visit: Payer: Medicare (Managed Care) | Admitting: Internal Medicine
